# Patient Record
Sex: Female | Born: 1950 | Race: Black or African American | Hispanic: No | Marital: Single | Smoking: Former smoker
Health system: Southern US, Community
[De-identification: ages and names within clinical notes are randomized; demographics above are authoritative.]

## PROBLEM LIST (undated history)

## (undated) ENCOUNTER — Emergency Department (HOSPITAL_COMMUNITY): Admission: EM | Payer: Medicare Other | Source: Home / Self Care

## (undated) DIAGNOSIS — R519 Headache, unspecified: Secondary | ICD-10-CM

## (undated) DIAGNOSIS — K219 Gastro-esophageal reflux disease without esophagitis: Secondary | ICD-10-CM

## (undated) DIAGNOSIS — E119 Type 2 diabetes mellitus without complications: Secondary | ICD-10-CM

## (undated) DIAGNOSIS — F32A Depression, unspecified: Secondary | ICD-10-CM

## (undated) DIAGNOSIS — E785 Hyperlipidemia, unspecified: Secondary | ICD-10-CM

## (undated) DIAGNOSIS — Z973 Presence of spectacles and contact lenses: Secondary | ICD-10-CM

## (undated) DIAGNOSIS — K5792 Diverticulitis of intestine, part unspecified, without perforation or abscess without bleeding: Secondary | ICD-10-CM

## (undated) DIAGNOSIS — M1990 Unspecified osteoarthritis, unspecified site: Secondary | ICD-10-CM

## (undated) DIAGNOSIS — R479 Unspecified speech disturbances: Secondary | ICD-10-CM

## (undated) DIAGNOSIS — R011 Cardiac murmur, unspecified: Secondary | ICD-10-CM

## (undated) DIAGNOSIS — R51 Headache: Secondary | ICD-10-CM

## (undated) DIAGNOSIS — I10 Essential (primary) hypertension: Secondary | ICD-10-CM

## (undated) DIAGNOSIS — F329 Major depressive disorder, single episode, unspecified: Secondary | ICD-10-CM

## (undated) HISTORY — DX: Type 2 diabetes mellitus without complications: E11.9

## (undated) HISTORY — DX: Hyperlipidemia, unspecified: E78.5

## (undated) HISTORY — DX: Unspecified osteoarthritis, unspecified site: M19.90

## (undated) HISTORY — DX: Depression, unspecified: F32.A

## (undated) HISTORY — DX: Unspecified speech disturbances: R47.9

## (undated) HISTORY — DX: Major depressive disorder, single episode, unspecified: F32.9

## (undated) MED FILL — Trazodone HCl Tab 50 MG: ORAL | Fill #5 | Status: AC

---

## 2008-04-12 ENCOUNTER — Ambulatory Visit (HOSPITAL_COMMUNITY): Admission: RE | Admit: 2008-04-12 | Discharge: 2008-04-12 | Payer: Self-pay | Admitting: Gastroenterology

## 2008-04-24 ENCOUNTER — Ambulatory Visit (HOSPITAL_COMMUNITY): Admission: RE | Admit: 2008-04-24 | Discharge: 2008-04-24 | Payer: Self-pay | Admitting: Gastroenterology

## 2009-08-27 ENCOUNTER — Emergency Department (HOSPITAL_COMMUNITY): Admission: EM | Admit: 2009-08-27 | Discharge: 2009-08-28 | Payer: Self-pay | Admitting: Emergency Medicine

## 2010-01-26 ENCOUNTER — Encounter: Admission: RE | Admit: 2010-01-26 | Discharge: 2010-02-17 | Payer: Self-pay | Admitting: Family Medicine

## 2010-04-28 ENCOUNTER — Encounter: Admission: RE | Admit: 2010-04-28 | Discharge: 2010-05-08 | Payer: Self-pay | Admitting: Family Medicine

## 2011-05-12 LAB — CREATININE, SERUM: Creatinine, Ser: 0.94

## 2011-07-29 ENCOUNTER — Ambulatory Visit (INDEPENDENT_AMBULATORY_CARE_PROVIDER_SITE_OTHER): Payer: Managed Care, Other (non HMO)

## 2011-07-29 DIAGNOSIS — E756 Lipid storage disorder, unspecified: Secondary | ICD-10-CM

## 2011-07-29 DIAGNOSIS — Z23 Encounter for immunization: Secondary | ICD-10-CM

## 2011-07-29 DIAGNOSIS — I10 Essential (primary) hypertension: Secondary | ICD-10-CM

## 2011-09-20 ENCOUNTER — Telehealth: Payer: Self-pay | Admitting: Physician Assistant

## 2011-09-20 MED ORDER — ZOLPIDEM TARTRATE 10 MG PO TABS: 10.0000 mg | ORAL_TABLET | Freq: Every evening | ORAL | Status: DC | PRN

## 2011-09-20 NOTE — Telephone Encounter (Signed)
Please phone in

## 2011-09-20 NOTE — Telephone Encounter (Signed)
Rx called in 

## 2011-12-06 ENCOUNTER — Telehealth: Payer: Self-pay

## 2011-12-06 NOTE — Telephone Encounter (Signed)
[facility]ien 10 mg #15 for patient    Best phone for robin at [place] [phone]

## 2011-12-06 NOTE — Telephone Encounter (Signed)
Please pull paper chart.  

## 2011-12-06 NOTE — Telephone Encounter (Signed)
Can we fill this?

## 2011-12-07 MED ORDER — ZOLPIDEM TARTRATE 10 MG PO TABS: ORAL_TABLET | ORAL | Status: DC

## 2011-12-07 NOTE — Telephone Encounter (Signed)
Will refill her Remus Loffler this time, please advise her to use 1/2 at night as needed to avoid oversedation, please schedule a return OV before more refills are needed, [facility]

## 2012-01-13 ENCOUNTER — Telehealth: Payer: Self-pay

## 2012-01-13 NOTE — Telephone Encounter (Signed)
[facility]IN FROM [facility] HAVE A QUESTION REGARDING SOME MEDICINE THAT PT HAVE REQUESTED PLEASE CALL [phone]

## 2012-01-13 NOTE — Telephone Encounter (Signed)
Pt was written a Rx and [facility] the Rx was written for Hyzaar 50mg / Cozaar 50 mg was dispensed in error. This was in December. Pt coming in today to renew RX and pharmacist wants to know if okay to dispense, patient obviously not compliant with meds, also advise if Hyzaar okay or Cozaar. [facility] phone # [facility] [phone]

## 2012-01-14 NOTE — Telephone Encounter (Signed)
Need paper chart  

## 2012-01-15 MED ORDER — LOSARTAN POTASSIUM-HCTZ 50-12.5 MG PO TABS: 1.0000 | ORAL_TABLET | Freq: Every day | ORAL | Status: DC

## 2012-01-15 NOTE — Telephone Encounter (Signed)
Chart pulled to PA 

## 2012-01-15 NOTE — Telephone Encounter (Signed)
Pharmacy notified.

## 2012-01-15 NOTE — Telephone Encounter (Signed)
Patient should be on Hyzaar 50 mg. Ok to fill one time. Needs ov.

## 2012-02-22 ENCOUNTER — Other Ambulatory Visit: Payer: Self-pay | Admitting: Family Medicine

## 2012-02-22 MED ORDER — VENLAFAXINE HCL 50 MG PO TABS: 50.0000 mg | ORAL_TABLET | Freq: Every day | ORAL | Status: DC

## 2012-04-04 ENCOUNTER — Ambulatory Visit (INDEPENDENT_AMBULATORY_CARE_PROVIDER_SITE_OTHER): Payer: Managed Care, Other (non HMO) | Admitting: Internal Medicine

## 2012-04-04 VITALS — BP 118/75 | HR 74 | Temp 98.1°F | Resp 16 | Ht 60.25 in | Wt 149.0 lb

## 2012-04-04 DIAGNOSIS — F32A Depression, unspecified: Secondary | ICD-10-CM

## 2012-04-04 DIAGNOSIS — F329 Major depressive disorder, single episode, unspecified: Secondary | ICD-10-CM

## 2012-04-04 DIAGNOSIS — F3289 Other specified depressive episodes: Secondary | ICD-10-CM

## 2012-04-04 DIAGNOSIS — G4700 Insomnia, unspecified: Secondary | ICD-10-CM

## 2012-04-04 DIAGNOSIS — E119 Type 2 diabetes mellitus without complications: Secondary | ICD-10-CM

## 2012-04-04 DIAGNOSIS — E785 Hyperlipidemia, unspecified: Secondary | ICD-10-CM

## 2012-04-04 LAB — GLUCOSE, POCT (MANUAL RESULT ENTRY): POC Glucose: 100 mg/dl — AB (ref 70–99)

## 2012-04-04 LAB — POCT GLYCOSYLATED HEMOGLOBIN (HGB A1C): Hemoglobin A1C: 6

## 2012-04-04 NOTE — Progress Notes (Signed)
  Subjective:    Patient ID: Meghan Hickman, female    DOB: [birth year], 61 y.o.   MRN: [id number]  HPI Ran out of diabetes medication yesterday. Urinating a lot at night 3  To 4 times a night. Weight gain  Compliant with all meds No chest pain no shortness of breath.  Review of Systems  Constitutional: Negative.   HENT: Negative.   Eyes: Negative.   Respiratory: Negative.   Cardiovascular: Negative.   Gastrointestinal: Negative.   Genitourinary: Positive for frequency.  Musculoskeletal: Negative.   Skin: Negative.   Neurological: Negative.   Hematological: Negative.   Psychiatric/Behavioral: Negative.   All other systems reviewed and are negative.       Objective:   Physical Exam  Nursing note and vitals reviewed. Constitutional: She is oriented to person, place, and time. She appears well-developed and well-nourished.  HENT:  Head: Normocephalic and atraumatic.  Right Ear: External ear normal.  Left Ear: External ear normal.  Mouth/Throat: Oropharynx is clear and moist.  Eyes: Conjunctivae and EOM are normal. Pupils are equal, round, and reactive to light.  Neck: Normal range of motion. Neck supple.  Cardiovascular: Normal rate, regular rhythm, normal heart sounds and intact distal pulses.   Pulmonary/Chest: Effort normal and breath sounds normal.  Abdominal: Soft. Bowel sounds are normal.  Musculoskeletal: Normal range of motion.  Neurological: She is alert and oriented to person, place, and time.  Skin: Skin is warm and dry.  Psychiatric: She has a normal mood and affect. Her behavior is normal. Judgment and thought content normal.   Results for orders placed in visit on 04/04/12  GLUCOSE, POCT (MANUAL RESULT ENTRY)      Component Value Range   POC Glucose 100 (*) 70 - 99 mg/dl  POCT GLYCOSYLATED HEMOGLOBIN (HGB A1C)      Component Value Range   Hemoglobin A1C 6.0           Assessment & Plan:  Diabetes will check her hgb a1c and glusose, will restart  her meds. Great glucose hgb a1c is good. Will continue meds.   Depression well controlled/ continue meds   Insomnia well controlled/ continue meds  hyyperlipidemia will continue meds and repeat blood tests  Pt needs new primary care provider since Dr Hal Hope has left.

## 2012-04-04 NOTE — Patient Instructions (Signed)
Take all meds as directed. Obtain new primary care physician. The rest of you labb eval will be back in one week.

## 2012-04-05 ENCOUNTER — Encounter: Payer: Self-pay | Admitting: Physician Assistant

## 2012-04-05 LAB — LIPID PANEL
Cholesterol: 228 mg/dL — ABNORMAL HIGH (ref 0–200)
HDL: 42 mg/dL (ref >39)
LDL Cholesterol: 156 mg/dL — ABNORMAL HIGH (ref 0–99)
Total CHOL/HDL Ratio: 5.4 ratio
Triglycerides: 148 mg/dL (ref <150)
VLDL: 30 mg/dL (ref 0–40)

## 2012-04-05 LAB — COMPREHENSIVE METABOLIC PANEL WITH GFR
ALT: 14 U/L (ref 0–35)
AST: 14 U/L (ref 0–37)
Albumin: 4.2 g/dL (ref 3.5–5.2)
Alkaline Phosphatase: 75 U/L (ref 39–117)
BUN: 15 mg/dL (ref 6–23)
CO2: 30 meq/L (ref 19–32)
Calcium: 10 mg/dL (ref 8.4–10.5)
Chloride: 104 meq/L (ref 96–112)
Creat: 0.8 mg/dL (ref 0.50–1.10)
Glucose, Bld: 95 mg/dL (ref 70–99)
Potassium: 4.1 meq/L (ref 3.5–5.3)
Sodium: 142 meq/L (ref 135–145)
Total Bilirubin: 0.8 mg/dL (ref 0.3–1.2)
Total Protein: 7.1 g/dL (ref 6.0–8.3)

## 2012-04-07 ENCOUNTER — Telehealth: Payer: Self-pay

## 2012-04-07 NOTE — Telephone Encounter (Signed)
The patient called and stated that her medications were not sent to the pharmacy as they should have been on Tuesday 04/04/12.  The patient stated that her Metformin,Pravachol,Effexor and Ambien rxs were not called in to pharmacy as told.  Please call the patient at [phone].

## 2012-04-08 MED ORDER — VENLAFAXINE HCL 50 MG PO TABS: 50.0000 mg | ORAL_TABLET | Freq: Every day | ORAL | Status: DC

## 2012-04-08 MED ORDER — METFORMIN HCL 500 MG PO TABS: 500.0000 mg | ORAL_TABLET | Freq: Every day | ORAL | Status: DC

## 2012-04-08 MED ORDER — ZOLPIDEM TARTRATE 10 MG PO TABS: ORAL_TABLET | ORAL | Status: DC

## 2012-04-08 MED ORDER — PRAVASTATIN SODIUM 40 MG PO TABS: 40.0000 mg | ORAL_TABLET | Freq: Every day | ORAL | Status: DC

## 2012-04-08 NOTE — Telephone Encounter (Signed)
Rx's done and sent to pharmacy. Ambien to be faxed.

## 2012-04-08 NOTE — Telephone Encounter (Signed)
Looks like we were supposed to refill these meds to pharmacy and have not--can we?

## 2012-04-08 NOTE — Telephone Encounter (Signed)
Rx faxed, patient notified.

## 2012-05-20 ENCOUNTER — Other Ambulatory Visit: Payer: Self-pay | Admitting: Radiology

## 2012-05-20 MED ORDER — LOSARTAN POTASSIUM-HCTZ 50-12.5 MG PO TABS: 1.0000 | ORAL_TABLET | Freq: Every day | ORAL | Status: DC

## 2012-05-23 ENCOUNTER — Other Ambulatory Visit: Payer: Self-pay | Admitting: Radiology

## 2012-05-30 ENCOUNTER — Ambulatory Visit (INDEPENDENT_AMBULATORY_CARE_PROVIDER_SITE_OTHER): Payer: Managed Care, Other (non HMO) | Admitting: Internal Medicine

## 2012-05-30 ENCOUNTER — Ambulatory Visit: Payer: Managed Care, Other (non HMO)

## 2012-05-30 VITALS — BP 121/79 | HR 101 | Temp 98.2°F | Resp 16 | Ht 59.25 in | Wt 150.0 lb

## 2012-05-30 DIAGNOSIS — M79609 Pain in unspecified limb: Secondary | ICD-10-CM

## 2012-05-30 DIAGNOSIS — S99929A Unspecified injury of unspecified foot, initial encounter: Secondary | ICD-10-CM

## 2012-05-30 DIAGNOSIS — S99919A Unspecified injury of unspecified ankle, initial encounter: Secondary | ICD-10-CM

## 2012-05-30 DIAGNOSIS — S92912A Unspecified fracture of left toe(s), initial encounter for closed fracture: Secondary | ICD-10-CM

## 2012-05-30 DIAGNOSIS — S8990XA Unspecified injury of unspecified lower leg, initial encounter: Secondary | ICD-10-CM

## 2012-05-30 DIAGNOSIS — M79675 Pain in left toe(s): Secondary | ICD-10-CM

## 2012-05-30 DIAGNOSIS — S92919A Unspecified fracture of unspecified toe(s), initial encounter for closed fracture: Secondary | ICD-10-CM

## 2012-05-30 NOTE — Progress Notes (Signed)
  Subjective:    Patient ID: Meghan Hickman, female    DOB: [birth year], 61 y.o.   MRN: [id number]  HPI  Meghan Hickman is a 61 yr old female complaining of left 5th toe pain after stubbing the toe about 2 weeks ago.  She notes pain in the little toe as well as some pain proximally into the foot.  She is able to bear weight but finds herself limping often.  She is only able to wear sandals due to pain when wearing a full shoe.  She notes that the left 5th toe is more swollen than the right.  No other toes are affected.  No ankle pain.  No numbness or tingling.    Review of Systems  All other systems reviewed and are negative.       Objective:   Physical Exam  Vitals reviewed. Constitutional: She is oriented to person, place, and time. She appears well-developed and well-nourished. No distress.  Cardiovascular: Intact distal pulses.   Musculoskeletal:       Left ankle: Normal.       Right foot: Normal.       Left foot: She exhibits tenderness and swelling. She exhibits no bony tenderness, normal capillary refill, no deformity and no laceration.       5th toe with mild swelling as compared to the right; full ROM; somewhat TTP over the toe and distal 5th metatarsal; no bony tenderness at the base of the 5th metatarsal  Neurological: She is alert and oriented to person, place, and time.  Skin: Skin is warm and dry.  Psychiatric: Her speech is normal and behavior is normal.       Affect somewhat flat    Filed Vitals:   05/30/12 1625  BP: 121/79  Pulse: 101  Temp: 98.2 F (36.8 C)  Resp: 16    [facility] reading (PRIMARY) by  Dr. Merla Riches, non-displaced fracture of the 5th proximal phalanx.        Assessment & Plan:   1. Toe fracture, left    2. Toe injury  DG Foot Complete Left  3. Toe pain, left  DG Foot Complete Left   Meghan Hickman is a 61 yr old female with a fractured left 5th toe.  The fracture is non-displaced, and there is no deformity of the toe.  The toe was buddy  taped, and the patient was instructed how to tape the toes at home.  She will keep the toe taped for 4-6 weeks.  Encouraged her to use tylenol/advil for pain if needed.   Discussed with the patient that her recovery may take several weeks.  She will RTC if worsening or not improving.    I have reviewed and agree with documentation. Robert P. Merla Riches, M.D.

## 2012-10-25 ENCOUNTER — Other Ambulatory Visit: Payer: Self-pay | Admitting: Physician Assistant

## 2012-10-25 NOTE — Telephone Encounter (Signed)
Please pull paper chart.  

## 2012-10-25 NOTE — Telephone Encounter (Signed)
Chart pulled to PA pool at nurses station [phone]

## 2012-10-27 ENCOUNTER — Other Ambulatory Visit: Payer: Self-pay | Admitting: Radiology

## 2012-10-27 NOTE — Telephone Encounter (Signed)
Patient needs office visit prior to approval of her Ambien. Denial faxed.

## 2013-01-08 ENCOUNTER — Other Ambulatory Visit: Payer: Self-pay | Admitting: Physician Assistant

## 2013-02-07 ENCOUNTER — Other Ambulatory Visit: Payer: Self-pay | Admitting: Orthopedic Surgery

## 2013-02-09 ENCOUNTER — Ambulatory Visit (INDEPENDENT_AMBULATORY_CARE_PROVIDER_SITE_OTHER): Payer: Managed Care, Other (non HMO) | Admitting: Emergency Medicine

## 2013-02-09 VITALS — BP 134/80 | HR 89 | Temp 98.0°F | Resp 16 | Ht 60.0 in | Wt 144.0 lb

## 2013-02-09 DIAGNOSIS — G4700 Insomnia, unspecified: Secondary | ICD-10-CM

## 2013-02-09 DIAGNOSIS — E119 Type 2 diabetes mellitus without complications: Secondary | ICD-10-CM | POA: Insufficient documentation

## 2013-02-09 DIAGNOSIS — I10 Essential (primary) hypertension: Secondary | ICD-10-CM

## 2013-02-09 DIAGNOSIS — R011 Cardiac murmur, unspecified: Secondary | ICD-10-CM

## 2013-02-09 DIAGNOSIS — F32A Depression, unspecified: Secondary | ICD-10-CM

## 2013-02-09 DIAGNOSIS — E785 Hyperlipidemia, unspecified: Secondary | ICD-10-CM | POA: Insufficient documentation

## 2013-02-09 DIAGNOSIS — F329 Major depressive disorder, single episode, unspecified: Secondary | ICD-10-CM

## 2013-02-09 LAB — COMPREHENSIVE METABOLIC PANEL
ALT: 14 U/L (ref 0–35)
Albumin: 4.5 g/dL (ref 3.5–5.2)
Calcium: 9.8 mg/dL (ref 8.4–10.5)
Glucose, Bld: 113 mg/dL — ABNORMAL HIGH (ref 70–99)
Sodium: 140 mEq/L (ref 135–145)
Total Bilirubin: 0.6 mg/dL (ref 0.3–1.2)
Total Protein: 7 g/dL (ref 6.0–8.3)

## 2013-02-09 LAB — LIPID PANEL
Cholesterol: 209 mg/dL — ABNORMAL HIGH (ref 0–200)
LDL Cholesterol: 148 mg/dL — ABNORMAL HIGH (ref 0–99)
Total CHOL/HDL Ratio: 4.4 Ratio
Triglycerides: 67 mg/dL (ref <150)
VLDL: 13 mg/dL (ref 0–40)

## 2013-02-09 LAB — POCT CBC
Granulocyte percent: 58.5 %G (ref 37–80)
Hemoglobin: 12.6 g/dL (ref 12.2–16.2)
MCH, POC: 27 pg (ref 27–31.2)
MCV: 88.1 fL (ref 80–97)
MPV: 8.8 fL (ref 0–99.8)
Platelet Count, POC: 379 10*3/uL (ref 142–424)
RBC: 4.66 M/uL (ref 4.04–5.48)
WBC: 7.3 10*3/uL (ref 4.6–10.2)

## 2013-02-09 LAB — GLUCOSE, POCT (MANUAL RESULT ENTRY): POC Glucose: 109 mg/dl — AB (ref 70–99)

## 2013-02-09 LAB — MICROALBUMIN, URINE: Microalb, Ur: 0.59 mg/dL (ref 0.00–1.89)

## 2013-02-09 MED ORDER — LOSARTAN POTASSIUM-HCTZ 50-12.5 MG PO TABS: 1.0000 | ORAL_TABLET | Freq: Every day | ORAL | Status: DC

## 2013-02-09 MED ORDER — ZOLPIDEM TARTRATE 5 MG PO TABS: 5.0000 mg | ORAL_TABLET | Freq: Every evening | ORAL | Status: DC | PRN

## 2013-02-09 MED ORDER — METFORMIN HCL 500 MG PO TABS: 500.0000 mg | ORAL_TABLET | Freq: Every day | ORAL | Status: DC

## 2013-02-09 MED ORDER — PRAVASTATIN SODIUM 40 MG PO TABS: 40.0000 mg | ORAL_TABLET | Freq: Every day | ORAL | Status: DC

## 2013-02-09 MED ORDER — VENLAFAXINE HCL 50 MG PO TABS: 50.0000 mg | ORAL_TABLET | Freq: Every day | ORAL | Status: DC

## 2013-02-09 NOTE — Progress Notes (Signed)
  Subjective:    Patient ID: Meghan Hickman, female    DOB: 15-Feb-1951, 62 y.o.   MRN: [id number]  HPI  62 YO patient here today to have her blood work done. She has been feeling tired. Her sugar was low a few weeks ago. She has felt tired ever since. She passed out one day. She forgot to eat. She was getting her car serviced then go eat. She got a cracker and was feeling fine after.   She is scheduled to have bone spurs removed from her shoulder July 14th. She wants a little check up to make sure she will be ok under anesthesia.   She has diabetes and high cholesterol and takes medications. She does not check her sugar right now she is out of her CBG strips. She was monitored by Dr. Hal Hope.     Review of Systems     Objective:   Physical Exam HEENT exam is unremarkable. Neck supple. Chest clear. Cardiac exam reveals a 2/6 systolic murmur at the left sternal border. Abdomen is tender midepigastrium and right upper abdomen  Results for orders placed in visit on 02/09/13  POCT CBC      Result Value Range   WBC 7.3  4.6 - 10.2 K/uL   Lymph, poc 2.5  0.6 - 3.4   POC LYMPH PERCENT 34.7  10 - 50 %L   MID (cbc) 0.5  0 - 0.9   POC MID % 6.8  0 - 12 %M   POC Granulocyte 4.3  2 - 6.9   Granulocyte percent 58.5  37 - 80 %G   RBC 4.66  4.04 - 5.48 M/uL   Hemoglobin 12.6  12.2 - 16.2 g/dL   HCT, POC 40.9  81.1 - 47.9 %   MCV 88.1  80 - 97 fL   MCH, POC 27.0  27 - 31.2 pg   MCHC 30.7 (*) 31.8 - 35.4 g/dL   RDW, POC 91.4     Platelet Count, POC 379  142 - 424 K/uL   MPV 8.8  0 - 99.8 fL  POCT GLYCOSYLATED HEMOGLOBIN (HGB A1C)      Result Value Range   Hemoglobin A1C 5.7    GLUCOSE, POCT (MANUAL RESULT ENTRY)      Result Value Range   POC Glucose 109 (*) 70 - 99 mg/dl       EKG THERE are tiny Q waves in 3 and aVF no acute changes Assessment & Plan:  Patient will need cardiac clearance prior to surgery because of her history and the presence of a heart murmur.

## 2013-02-11 DIAGNOSIS — R011 Cardiac murmur, unspecified: Secondary | ICD-10-CM | POA: Insufficient documentation

## 2013-02-11 MED ORDER — ATORVASTATIN CALCIUM 40 MG PO TABS: 40.0000 mg | ORAL_TABLET | Freq: Every day | ORAL | Status: DC

## 2013-02-11 NOTE — Addendum Note (Signed)
Addended by: Johnnette Litter on: 02/11/2013 10:03 AM   Modules accepted: Orders, Medications

## 2013-02-13 ENCOUNTER — Encounter (HOSPITAL_BASED_OUTPATIENT_CLINIC_OR_DEPARTMENT_OTHER): Payer: Self-pay | Admitting: *Deleted

## 2013-02-13 NOTE — Progress Notes (Signed)
Dr Cleta Alberts office has pt ov with dr Nadara Eaton 02/14/13-will get recorded after visit

## 2013-02-13 NOTE — Progress Notes (Signed)
Pt saw dr Cleta Alberts 02/09/13-had ekg and cbc cmet-he is referring her to cardiology prior to surgery-02/19/13-called their office-they are working on an ov -will let me know

## 2013-02-19 ENCOUNTER — Ambulatory Visit (HOSPITAL_BASED_OUTPATIENT_CLINIC_OR_DEPARTMENT_OTHER): Admission: RE | Admit: 2013-02-19 | Payer: Self-pay | Source: Ambulatory Visit | Admitting: Orthopedic Surgery

## 2013-02-19 HISTORY — DX: Essential (primary) hypertension: I10

## 2013-02-19 HISTORY — DX: Cardiac murmur, unspecified: R01.1

## 2013-02-19 HISTORY — DX: Presence of spectacles and contact lenses: Z97.3

## 2013-02-19 SURGERY — SHOULDER ARTHROSCOPY WITH SUBACROMIAL DECOMPRESSION AND DISTAL CLAVICLE EXCISION
Anesthesia: Choice | Site: Shoulder | Laterality: Right

## 2013-02-19 SURGERY — Surgical Case
Anesthesia: *Unknown

## 2013-03-20 ENCOUNTER — Encounter: Payer: Self-pay | Admitting: Emergency Medicine

## 2013-03-28 ENCOUNTER — Other Ambulatory Visit: Payer: Self-pay | Admitting: Orthopedic Surgery

## 2013-04-03 ENCOUNTER — Other Ambulatory Visit: Payer: Self-pay | Admitting: Emergency Medicine

## 2013-04-16 ENCOUNTER — Encounter (HOSPITAL_BASED_OUTPATIENT_CLINIC_OR_DEPARTMENT_OTHER): Payer: Self-pay | Admitting: *Deleted

## 2013-04-16 NOTE — Progress Notes (Signed)
Pt was to have surgery 7/14-had ro see dr Nadara Eaton prior-called for notes since clearance Needs to come in for bmet

## 2013-04-23 ENCOUNTER — Encounter (HOSPITAL_BASED_OUTPATIENT_CLINIC_OR_DEPARTMENT_OTHER)
Admission: RE | Disposition: A | Discharge status: Home / Self Care | Payer: Self-pay | Source: Ambulatory Visit | Attending: Orthopedic Surgery

## 2013-04-23 ENCOUNTER — Encounter (HOSPITAL_BASED_OUTPATIENT_CLINIC_OR_DEPARTMENT_OTHER): Payer: Self-pay | Admitting: *Deleted

## 2013-04-23 ENCOUNTER — Ambulatory Visit (HOSPITAL_BASED_OUTPATIENT_CLINIC_OR_DEPARTMENT_OTHER)
Admission: RE | Admit: 2013-04-23 | Discharge: 2013-04-24 | Disposition: A | Discharge status: Home / Self Care | Source: Ambulatory Visit | Attending: Orthopedic Surgery | Admitting: Orthopedic Surgery

## 2013-04-23 ENCOUNTER — Encounter (HOSPITAL_BASED_OUTPATIENT_CLINIC_OR_DEPARTMENT_OTHER): Payer: Self-pay | Admitting: Anesthesiology

## 2013-04-23 ENCOUNTER — Ambulatory Visit (HOSPITAL_BASED_OUTPATIENT_CLINIC_OR_DEPARTMENT_OTHER): Admitting: Anesthesiology

## 2013-04-23 DIAGNOSIS — M75120 Complete rotator cuff tear or rupture of unspecified shoulder, not specified as traumatic: Secondary | ICD-10-CM | POA: Insufficient documentation

## 2013-04-23 DIAGNOSIS — M19019 Primary osteoarthritis, unspecified shoulder: Secondary | ICD-10-CM | POA: Insufficient documentation

## 2013-04-23 DIAGNOSIS — M25819 Other specified joint disorders, unspecified shoulder: Secondary | ICD-10-CM | POA: Insufficient documentation

## 2013-04-23 DIAGNOSIS — Z9889 Other specified postprocedural states: Secondary | ICD-10-CM

## 2013-04-23 DIAGNOSIS — I10 Essential (primary) hypertension: Secondary | ICD-10-CM | POA: Insufficient documentation

## 2013-04-23 DIAGNOSIS — E119 Type 2 diabetes mellitus without complications: Secondary | ICD-10-CM | POA: Insufficient documentation

## 2013-04-23 DIAGNOSIS — M66329 Spontaneous rupture of flexor tendons, unspecified upper arm: Secondary | ICD-10-CM | POA: Insufficient documentation

## 2013-04-23 DIAGNOSIS — E785 Hyperlipidemia, unspecified: Secondary | ICD-10-CM | POA: Insufficient documentation

## 2013-04-23 HISTORY — PX: SHOULDER ARTHROSCOPY WITH SUBACROMIAL DECOMPRESSION AND BICEP TENDON REPAIR: SHX5689

## 2013-04-23 LAB — GLUCOSE, CAPILLARY: Glucose-Capillary: 175 mg/dL — ABNORMAL HIGH (ref 70–99)

## 2013-04-23 SURGERY — SHOULDER ARTHROSCOPY WITH SUBACROMIAL DECOMPRESSION AND BICEP TENDON REPAIR
Anesthesia: Regional | Laterality: Right | Wound class: Clean

## 2013-04-23 MED ORDER — PROPOFOL 10 MG/ML IV BOLUS
INTRAVENOUS | Status: DC | PRN
  Administered 2013-04-23: 150 mg via INTRAVENOUS

## 2013-04-23 MED ORDER — SUCCINYLCHOLINE CHLORIDE 20 MG/ML IJ SOLN
INTRAMUSCULAR | Status: DC | PRN
  Administered 2013-04-23: 100 mg via INTRAVENOUS

## 2013-04-23 MED ORDER — METFORMIN HCL 500 MG PO TABS
500.0000 mg | ORAL_TABLET | Freq: Every day | ORAL | Status: DC
  Administered 2013-04-23 (×2): 500 mg via ORAL

## 2013-04-23 MED ORDER — OXYCODONE HCL 5 MG/5ML PO SOLN: 5.0000 mg | Freq: Once | ORAL | Status: DC | PRN

## 2013-04-23 MED ORDER — PHENOL 1.4 % MT LIQD: 1.0000 | OROMUCOSAL | Status: DC | PRN

## 2013-04-23 MED ORDER — BISACODYL 10 MG RE SUPP: 10.0000 mg | Freq: Every day | RECTAL | Status: DC | PRN

## 2013-04-23 MED ORDER — POVIDONE-IODINE 7.5 % EX SOLN: Freq: Once | CUTANEOUS | Status: DC

## 2013-04-23 MED ORDER — LIDOCAINE HCL (CARDIAC) 20 MG/ML IV SOLN
INTRAVENOUS | Status: DC | PRN
  Administered 2013-04-23: 50 mg via INTRAVENOUS

## 2013-04-23 MED ORDER — ZOLPIDEM TARTRATE 5 MG PO TABS: 5.0000 mg | ORAL_TABLET | Freq: Every evening | ORAL | Status: DC | PRN

## 2013-04-23 MED ORDER — ONDANSETRON HCL 4 MG/2ML IJ SOLN
INTRAMUSCULAR | Status: DC | PRN
  Administered 2013-04-23: 4 mg via INTRAVENOUS

## 2013-04-23 MED ORDER — ACETAMINOPHEN 650 MG RE SUPP: 650.0000 mg | Freq: Four times a day (QID) | RECTAL | Status: DC | PRN

## 2013-04-23 MED ORDER — FENTANYL CITRATE 0.05 MG/ML IJ SOLN
50.0000 ug | INTRAMUSCULAR | Status: DC | PRN
  Administered 2013-04-23: 100 ug via INTRAVENOUS

## 2013-04-23 MED ORDER — DOCUSATE SODIUM 100 MG PO CAPS
100.0000 mg | ORAL_CAPSULE | Freq: Two times a day (BID) | ORAL | Status: DC
  Administered 2013-04-23: 100 mg via ORAL

## 2013-04-23 MED ORDER — SODIUM CHLORIDE 0.9 % IR SOLN
Status: DC | PRN
  Administered 2013-04-23: 6000 mL

## 2013-04-23 MED ORDER — OXYCODONE HCL 5 MG PO TABS: 5.0000 mg | ORAL_TABLET | ORAL | Status: DC | PRN

## 2013-04-23 MED ORDER — POLYETHYLENE GLYCOL 3350 17 G PO PACK: 17.0000 g | PACK | Freq: Every day | ORAL | Status: DC | PRN

## 2013-04-23 MED ORDER — OXYCODONE HCL 5 MG PO TABS: 5.0000 mg | ORAL_TABLET | Freq: Once | ORAL | Status: DC | PRN

## 2013-04-23 MED ORDER — CEFAZOLIN SODIUM-DEXTROSE 2-3 GM-% IV SOLR
2.0000 g | INTRAVENOUS | Status: AC
  Administered 2013-04-23: 2 g via INTRAVENOUS

## 2013-04-23 MED ORDER — ONDANSETRON HCL 4 MG/2ML IJ SOLN: 4.0000 mg | Freq: Four times a day (QID) | INTRAMUSCULAR | Status: DC | PRN

## 2013-04-23 MED ORDER — VENLAFAXINE HCL 50 MG PO TABS
50.0000 mg | ORAL_TABLET | Freq: Every day | ORAL | Status: DC
  Administered 2013-04-23: 50 mg via ORAL

## 2013-04-23 MED ORDER — OXYCODONE-ACETAMINOPHEN 5-325 MG PO TABS: 1.0000 | ORAL_TABLET | ORAL | Status: DC | PRN

## 2013-04-23 MED ORDER — METOCLOPRAMIDE HCL 5 MG PO TABS: 5.0000 mg | ORAL_TABLET | Freq: Three times a day (TID) | ORAL | Status: DC | PRN

## 2013-04-23 MED ORDER — ONDANSETRON HCL 4 MG PO TABS: 4.0000 mg | ORAL_TABLET | Freq: Four times a day (QID) | ORAL | Status: DC | PRN

## 2013-04-23 MED ORDER — HYDROMORPHONE HCL PF 1 MG/ML IJ SOLN: 0.2500 mg | INTRAMUSCULAR | Status: DC | PRN

## 2013-04-23 MED ORDER — BUPIVACAINE-EPINEPHRINE PF 0.5-1:200000 % IJ SOLN
INTRAMUSCULAR | Status: DC | PRN
  Administered 2013-04-23: 30 mL

## 2013-04-23 MED ORDER — SODIUM CHLORIDE 0.9 % IV SOLN
INTRAVENOUS | Status: DC
  Administered 2013-04-23: 100 mL/h via INTRAVENOUS

## 2013-04-23 MED ORDER — INSULIN ASPART 100 UNIT/ML ~~LOC~~ SOLN
0.0000 [IU] | Freq: Three times a day (TID) | SUBCUTANEOUS | Status: DC
  Administered 2013-04-23: 3 [IU] via SUBCUTANEOUS

## 2013-04-23 MED ORDER — PHENYLEPHRINE HCL 10 MG/ML IJ SOLN
10.0000 mg | INTRAVENOUS | Status: DC | PRN
  Administered 2013-04-23: 40 ug/min via INTRAVENOUS

## 2013-04-23 MED ORDER — ACETAMINOPHEN 325 MG PO TABS: 650.0000 mg | ORAL_TABLET | Freq: Four times a day (QID) | ORAL | Status: DC | PRN

## 2013-04-23 MED ORDER — MORPHINE SULFATE 2 MG/ML IJ SOLN: 1.0000 mg | INTRAMUSCULAR | Status: DC | PRN

## 2013-04-23 MED ORDER — TRAMADOL HCL 50 MG PO TABS: 50.0000 mg | ORAL_TABLET | Freq: Four times a day (QID) | ORAL | Status: DC | PRN

## 2013-04-23 MED ORDER — HYDROCODONE-ACETAMINOPHEN 5-325 MG PO TABS: 1.0000 | ORAL_TABLET | ORAL | Status: DC | PRN

## 2013-04-23 MED ORDER — DIPHENHYDRAMINE HCL 12.5 MG/5ML PO ELIX: 12.5000 mg | ORAL_SOLUTION | ORAL | Status: DC | PRN

## 2013-04-23 MED ORDER — DEXAMETHASONE SODIUM PHOSPHATE 4 MG/ML IJ SOLN
INTRAMUSCULAR | Status: DC | PRN
  Administered 2013-04-23: 8 mg via INTRAVENOUS

## 2013-04-23 MED ORDER — EPHEDRINE SULFATE 50 MG/ML IJ SOLN
INTRAMUSCULAR | Status: DC | PRN
  Administered 2013-04-23: 10 mg via INTRAVENOUS

## 2013-04-23 MED ORDER — FLEET ENEMA 7-19 GM/118ML RE ENEM: 1.0000 | ENEMA | Freq: Once | RECTAL | Status: AC | PRN

## 2013-04-23 MED ORDER — LACTATED RINGERS IV SOLN
INTRAVENOUS | Status: DC
  Administered 2013-04-23 (×2): via INTRAVENOUS

## 2013-04-23 MED ORDER — MENTHOL 3 MG MT LOZG: 1.0000 | LOZENGE | OROMUCOSAL | Status: DC | PRN

## 2013-04-23 MED ORDER — ATORVASTATIN CALCIUM 40 MG PO TABS
40.0000 mg | ORAL_TABLET | Freq: Every day | ORAL | Status: DC
  Administered 2013-04-24: 40 mg via ORAL

## 2013-04-23 MED ORDER — PHENYLEPHRINE HCL 10 MG/ML IJ SOLN
INTRAMUSCULAR | Status: DC | PRN
  Administered 2013-04-23 (×2): 40 ug via INTRAVENOUS

## 2013-04-23 MED ORDER — MIDAZOLAM HCL 2 MG/2ML IJ SOLN
1.0000 mg | INTRAMUSCULAR | Status: DC | PRN
  Administered 2013-04-23: 2 mg via INTRAVENOUS

## 2013-04-23 MED ORDER — METOCLOPRAMIDE HCL 5 MG/ML IJ SOLN: 5.0000 mg | Freq: Three times a day (TID) | INTRAMUSCULAR | Status: DC | PRN

## 2013-04-23 MED ORDER — ASPIRIN 81 MG PO TABS: 81.0000 mg | ORAL_TABLET | Freq: Every day | ORAL | Status: DC

## 2013-04-23 MED ORDER — LACTATED RINGERS IV SOLN: INTRAVENOUS | Status: DC

## 2013-04-23 MED ORDER — LOSARTAN POTASSIUM-HCTZ 50-12.5 MG PO TABS
1.0000 | ORAL_TABLET | Freq: Every day | ORAL | Status: DC
  Administered 2013-04-24: 1 via ORAL

## 2013-04-23 SURGICAL SUPPLY — 81 items
BENZOIN TINCTURE PRP APPL 2/3 (GAUZE/BANDAGES/DRESSINGS) IMPLANT
BLADE SURG 15 STRL LF DISP TIS (BLADE) IMPLANT
BLADE SURG 15 STRL SS (BLADE)
BLADE SURG ROTATE 9660 (MISCELLANEOUS) IMPLANT
BLADE VORTEX 6.0 (BLADE) IMPLANT
BUR OVAL 4.0 (BURR) ×2 IMPLANT
CANISTER SUCT LVC 12 LTR MEDI- (MISCELLANEOUS) ×2 IMPLANT
CANISTER SUCTION 2500CC (MISCELLANEOUS) IMPLANT
CANNULA 5.75X71 LONG (CANNULA) ×2 IMPLANT
CANNULA TWIST IN 8.25X7CM (CANNULA) IMPLANT
CHLORAPREP W/TINT 26ML (MISCELLANEOUS) ×2 IMPLANT
CLOTH BEACON ORANGE TIMEOUT ST (SAFETY) ×2 IMPLANT
DECANTER SPIKE VIAL GLASS SM (MISCELLANEOUS) IMPLANT
DERMABOND ADVANCED (GAUZE/BANDAGES/DRESSINGS)
DERMABOND ADVANCED .7 DNX12 (GAUZE/BANDAGES/DRESSINGS) IMPLANT
DRAPE INCISE IOBAN 66X45 STRL (DRAPES) ×2 IMPLANT
DRAPE STERI 35X30 U-POUCH (DRAPES) ×2 IMPLANT
DRAPE SURG 17X23 STRL (DRAPES) ×2 IMPLANT
DRAPE U 20/CS (DRAPES) ×2 IMPLANT
DRAPE U-SHAPE 47X51 STRL (DRAPES) ×2 IMPLANT
DRAPE U-SHAPE 76X120 STRL (DRAPES) ×4 IMPLANT
DRSG PAD ABDOMINAL 8X10 ST (GAUZE/BANDAGES/DRESSINGS) ×2 IMPLANT
ELECT BLADE 4.0 EZ CLEAN MEGAD (MISCELLANEOUS)
ELECT REM PT RETURN 9FT ADLT (ELECTROSURGICAL) ×2
ELECTRODE BLDE 4.0 EZ CLN MEGD (MISCELLANEOUS) IMPLANT
ELECTRODE REM PT RTRN 9FT ADLT (ELECTROSURGICAL) ×1 IMPLANT
GAUZE SPONGE 4X4 16PLY XRAY LF (GAUZE/BANDAGES/DRESSINGS) IMPLANT
GAUZE XEROFORM 1X8 LF (GAUZE/BANDAGES/DRESSINGS) ×2 IMPLANT
GLOVE BIO SURGEON STRL SZ7 (GLOVE) ×2 IMPLANT
GLOVE BIO SURGEON STRL SZ7.5 (GLOVE) ×2 IMPLANT
GLOVE BIOGEL PI IND STRL 7.0 (GLOVE) ×2 IMPLANT
GLOVE BIOGEL PI IND STRL 8 (GLOVE) ×1 IMPLANT
GLOVE BIOGEL PI INDICATOR 7.0 (GLOVE) ×2
GLOVE BIOGEL PI INDICATOR 8 (GLOVE) ×1
GLOVE ECLIPSE 6.5 STRL STRAW (GLOVE) ×2 IMPLANT
GOWN BRE IMP PREV XXLGXLNG (GOWN DISPOSABLE) ×2 IMPLANT
GOWN PREVENTION PLUS XLARGE (GOWN DISPOSABLE) ×4 IMPLANT
LASSO CRESCENT QUICKPASS (SUTURE) IMPLANT
NDL SUT 6 .5 CRC .975X.05 MAYO (NEEDLE) IMPLANT
NEEDLE 1/2 CIR CATGUT .05X1.09 (NEEDLE) IMPLANT
NEEDLE MAYO TAPER (NEEDLE)
NEEDLE SCORPION MULTI FIRE (NEEDLE) IMPLANT
NS IRRIG 1000ML POUR BTL (IV SOLUTION) IMPLANT
PACK ARTHROSCOPY DSU (CUSTOM PROCEDURE TRAY) ×2 IMPLANT
PACK BASIN DAY SURGERY FS (CUSTOM PROCEDURE TRAY) ×2 IMPLANT
PENCIL BUTTON HOLSTER BLD 10FT (ELECTRODE) IMPLANT
RESECTOR FULL RADIUS 4.2MM (BLADE) ×2 IMPLANT
SLEEVE SCD COMPRESS KNEE MED (MISCELLANEOUS) ×2 IMPLANT
SLING ARM FOAM STRAP LRG (SOFTGOODS) IMPLANT
SLING ARM FOAM STRAP MED (SOFTGOODS) ×2 IMPLANT
SLING ARM FOAM STRAP XLG (SOFTGOODS) IMPLANT
SLING ARM IMMOBILIZER MED (SOFTGOODS) IMPLANT
SPONGE GAUZE 4X4 12PLY (GAUZE/BANDAGES/DRESSINGS) ×2 IMPLANT
SPONGE LAP 4X18 X RAY DECT (DISPOSABLE) IMPLANT
STRIP CLOSURE SKIN 1/2X4 (GAUZE/BANDAGES/DRESSINGS) IMPLANT
SUCTION FRAZIER TIP 10 FR DISP (SUCTIONS) IMPLANT
SUPPORT WRAP ARM LG (MISCELLANEOUS) IMPLANT
SUT 2 FIBERLOOP 20 STRT BLUE (SUTURE)
SUT BONE WAX W31G (SUTURE) IMPLANT
SUT ETHIBOND 2 OS 4 DA (SUTURE) IMPLANT
SUT ETHILON 3 0 PS 1 (SUTURE) IMPLANT
SUT ETHILON 4 0 PS 2 18 (SUTURE) ×2 IMPLANT
SUT FIBERWIRE #2 38 T-5 BLUE (SUTURE)
SUT MNCRL AB 3-0 PS2 18 (SUTURE) IMPLANT
SUT MNCRL AB 4-0 PS2 18 (SUTURE) IMPLANT
SUT PDS AB 0 CT 36 (SUTURE) IMPLANT
SUT PROLENE 3 0 PS 2 (SUTURE) IMPLANT
SUT VIC AB 0 CT1 27 (SUTURE)
SUT VIC AB 0 CT1 27XBRD ANBCTR (SUTURE) IMPLANT
SUT VIC AB 2-0 SH 27 (SUTURE)
SUT VIC AB 2-0 SH 27XBRD (SUTURE) IMPLANT
SUTURE 2 FIBERLOOP 20 STRT BLU (SUTURE) IMPLANT
SUTURE FIBERWR #2 38 T-5 BLUE (SUTURE) IMPLANT
SYR BULB 3OZ (MISCELLANEOUS) IMPLANT
TOWEL OR 17X24 6PK STRL BLUE (TOWEL DISPOSABLE) ×2 IMPLANT
TOWEL OR NON WOVEN STRL DISP B (DISPOSABLE) ×2 IMPLANT
TUBE CONNECTING 20X1/4 (TUBING) ×2 IMPLANT
TUBING ARTHROSCOPY IRRIG 16FT (MISCELLANEOUS) ×2 IMPLANT
WAND STAR VAC 90 (SURGICAL WAND) ×2 IMPLANT
WATER STERILE IRR 1000ML POUR (IV SOLUTION) ×2 IMPLANT
YANKAUER SUCT BULB TIP NO VENT (SUCTIONS) IMPLANT

## 2013-04-23 NOTE — Transfer of Care (Signed)
Immediate Anesthesia Transfer of Care Note  Patient: Meghan Hickman  Procedure(s) Performed: Procedure(s): SHOULDER ARTHROSCOPY WITH SUBACROMIAL DECOMPRESSION AND BICEP TENDON TENOTOMY (Right)  Patient Location: PACU  Anesthesia Type:General and Regional  Level of Consciousness: sedated  Airway & Oxygen Therapy: Patient Spontanous Breathing and Patient connected to face mask oxygen  Post-op Assessment: Report given to PACU RN and Post -op Vital signs reviewed and stable  Post vital signs: Reviewed and stable  Complications: No apparent anesthesia complications

## 2013-04-23 NOTE — Anesthesia Procedure Notes (Addendum)
Anesthesia Regional Block:  Interscalene brachial plexus block  Pre-Anesthetic Checklist: ,, timeout performed, Correct Patient, Correct Site, Correct Laterality, Correct Procedure, Correct Position, site marked, Risks and benefits discussed, pre-op evaluation,  At surgeon's request and post-op pain management  Laterality: Right  Prep: Maximum Sterile Barrier Precautions used and chloraprep       Needles:  Injection technique: Single-shot  Needle Type: Echogenic Stimulator Needle      Needle Gauge: 22 and 22 G    Additional Needles:  Procedures: ultrasound guided (picture in chart) and nerve stimulator Interscalene brachial plexus block  Nerve Stimulator or Paresthesia:  Response: Biceps response, 0.4 mA,   Additional Responses:   Narrative:  Start time: 04/23/2013 10:55 AM End time: 04/23/2013 11:10 AM Injection made incrementally with aspirations every 5 mL. Anesthesiologist: Sampson Goon, MD  Additional Notes: 2% Lidocaine skin wheel.   Interscalene brachial plexus block Procedure Name: Intubation Date/Time: 04/23/2013 11:45 AM Performed by: Zenia Resides D Pre-anesthesia Checklist: Patient identified, Emergency Drugs available, Suction available and Patient being monitored Patient Re-evaluated:Patient Re-evaluated prior to inductionOxygen Delivery Method: Circle System Utilized Preoxygenation: Pre-oxygenation with 100% oxygen Intubation Type: IV induction Ventilation: Mask ventilation without difficulty Laryngoscope Size: Mac and 3 Grade View: Grade II Tube type: Oral Tube size: 7.0 mm Number of attempts: 1 Airway Equipment and Method: stylet and oral airway Placement Confirmation: ETT inserted through vocal cords under direct vision,  positive ETCO2 and breath sounds checked- equal and bilateral Secured at: 22 cm Tube secured with: Tape Dental Injury: Teeth and Oropharynx as per pre-operative assessment

## 2013-04-23 NOTE — Anesthesia Postprocedure Evaluation (Signed)
  Anesthesia Post-op Note  Patient: Meghan Hickman  Procedure(s) Performed: Procedure(s): SHOULDER ARTHROSCOPY WITH SUBACROMIAL DECOMPRESSION AND BICEP TENDON TENOTOMY (Right)  Patient Location: PACU  Anesthesia Type:GA combined with regional for post-op pain  Level of Consciousness: awake and alert   Airway and Oxygen Therapy: Patient Spontanous Breathing and Patient connected to nasal cannula oxygen  Post-op Pain: none  Post-op Assessment: Post-op Vital signs reviewed, Patient's Cardiovascular Status Stable, Respiratory Function Stable, Patent Airway and No signs of Nausea or vomiting  Post-op Vital Signs: Reviewed and stable  Complications: No apparent anesthesia complications

## 2013-04-23 NOTE — Op Note (Signed)
Procedure(s): SHOULDER ARTHROSCOPY WITH SUBACROMIAL DECOMPRESSION AND BICEP TENDON TENOTOMY Procedure Note  Meghan Hickman female 62 y.o. 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 joint arthritis Surgeon(s) and Role:    * Mable Paris, MD - Primary     Surgeon: Mable Paris   Assistants: Damita Lack PA-C  Anesthesia: General endotracheal anesthesia with preoperative interscalene block     Procedure Detail  SHOULDER ARTHROSCOPY WITH SUBACROMIAL DECOMPRESSION AND BICEP TENDON TENOTOMY  Estimated Blood Loss: Min         Drains: none  Blood Given: none         Specimens: none        Complications:  * No complications entered in OR log *         Disposition: PACU - hemodynamically stable. she will be kept overnight for observation pain control         Condition: stable    Procedure:   INDICATIONS FOR SURGERY: The patient is 62 y.o. female who has had a long history of right shoulder problems. She has a irreparable rotator cuff tear on the right side with chronic pain and mechanical symptoms. She also had symptomatic a.c. joint DJD. She has some early glenohumeral arthropathy developing but in an attempt to try and avoid a shoulder replacement she want to go forward with the debridement procedure for pain relief. She understood risks benefits alternatives to the procedure including but not limited to risk of bleeding infection damage to neurovascular  structures and the possibility of incomplete pain relief.  OPERATIVE FINDINGS: Examination under anesthesia: Range of motion is approximately 160 forward flexion, external rotation to about 35. Diagnostic Arthroscopy:  Glenoid articular cartilage: Diffuse thinning with some grade 2 and grade 3 changes. Humeral head articular cartilage: Mostly grade 3 changes with a few small flaps of cartilage which were debrided. Labrum: Degenerative tears debridement Loose bodies: None Synovitis: Extensive, debrided  rotator cuff: She and extensive rotator cuff tear involving supraspinatus and infraspinatus retracted beyond the level of the glenoid. This was not repairable. It was debrided. The undersurface of the acromion and significant wear indicating articulation with the superior humeral head. There is significant anterior and lateral downsloping which was addressed with a bur. This opened up the subacromial space. She was noted to have severe bone-on-bone arthritis of the a.c. joint which was addressed with a clavicle excision removing about 8 mm of the clavicle and a smooth even fashion.  DESCRIPTION OF PROCEDURE: The patient was identified in preoperative  holding area where I personally marked the operative site after  verifying site, side, and procedure with the patient. An interscalene block was given by the attending anesthesiologist the holding area.  The patient was taken back to the operating room where general anesthesia was induced without complication and was placed in the beach-chair position with the back  elevated about 60 degrees and all extremities and head and neck carefully padded and  positioned.   The right upper extremity was then prepped and  draped in a standard sterile fashion. The appropriate time-out  procedure was carried out. The patient did receive  IV antibiotics  within 30 minutes of incision.   A small posterior portal incision was made and the arthroscope was  introduced into the joint. An anterior portal was then established above the subscapularis using needle localization. Small cannula was placed anteriorly. Diagnostic arthroscopy was then carried out with findings as described above.  She was noted to have extensive longitudinal tearing and splaying of the long head biceps tendon. This was felt to be potentially a pain generator in the setting of her chronic irreparable rotator cuff tear. Therefore a biceps tenotomy was carried out through the anterior intra-articular portal. Glenohumeral joint surfaces were examined. There is significant irregularity of the humeral head with some grade 2 and 3 changes. There were several small flaps of cartilage which was debrided with the shaver. The glenoid and no significant chondral flaps of the cartilage is noted be diffusely thinned. The rotator cuff was examined and found to be completely torn and retracted beyond the level of the glenoid. There is extensive synovitis throughout the joint which was debrided with the shaver and the ArthriCare.  The arthroscope was then introduced into the subacromial space a standard lateral portal was established with needle localization. The space between the humeral head and the acromion was noted to be very tight. The anterolateral acromion and wear signs consistent with articulation with the humeral head. There is no discernible coracoacromial ligament. The bur was used through the lateral portal to resect the lateral downsloping spur in the anterior downsloping spur on the acromion. She did appear to have an os acromiale which was not entered.  The distal clavicle was exposed arthroscopically and the bur was used to take off the undersurface for approximately 8 mm from the lateral portal. The bur was then moved to an anterior portal position to complete the distal clavicle excision resecting about 8 mm of the distal clavicle and a smooth even fashion. This was viewed from anterior  and lateral portals and felt to be complete.  Rotator cuff there was viewed from the lateral portal. Extended around posteriorly quite a ways. The subscapularis was partially torn but not retracted and was debrided. The supraspinatus was retracted beyond the level of the glenoid was debrided.  The arthroscopic equipment was removed from the joint and the portals were closed with 3-0 nylon in an interrupted fashion. Sterile dressings were then applied including Xeroform 4 x 4's ABDs and tape. The patient was then allowed to awaken from general anesthesia, placed in a sling, transferred to the stretcher and taken to the recovery room in stable condition.   POSTOPERATIVE PLAN: The patient will be discharged home today and will followup in one week for suture removal and wound check.  We will get her into physical therapy.

## 2013-04-23 NOTE — H&P (Signed)
Meghan Hickman is an 62 y.o. female.   Chief Complaint: R shoulder pain HPI: R shoulder chronic irrepairable rotator cuff tear with early arthritis.  Pain interferes with quality of life and sleep.  Failed conservative management.  Past Medical History  Diagnosis Date  . Arthritis   . Diabetes mellitus without complication   . Hyperlipidemia   . Heart murmur   . Hypertension   . Wears glasses     History reviewed. No pertinent past surgical history.  History reviewed. No pertinent family history. Social History:  reports that she quit smoking about 8 months ago. Her smoking use included Cigarettes. She started smoking about a year ago. She has a 2 pack-year smoking history. She does not have any smokeless tobacco history on file. She reports that she does not drink alcohol or use illicit drugs.  Allergies: No Known Allergies  Medications Prior to Admission  Medication Sig Dispense Refill  . aspirin 81 MG tablet Take 81 mg by mouth daily.      [name] Kitchen atorvastatin (LIPITOR) 40 MG tablet Take 1 tablet (40 mg total) by mouth daily.  30 tablet  5  . losartan-hydrochlorothiazide (HYZAAR) 50-12.5 MG per tablet Take 1 tablet by mouth daily.  30 tablet  5  . metFORMIN (GLUCOPHAGE) 500 MG tablet Take 1 tablet (500 mg total) by mouth daily.  30 tablet  5  . traMADol (ULTRAM) 50 MG tablet Take 50 mg by mouth every 6 (six) hours as needed for pain.      [name] Kitchen venlafaxine (EFFEXOR) 50 MG tablet Take 1 tablet (50 mg total) by mouth daily.  30 tablet  3  . zolpidem (AMBIEN) 5 MG tablet Take 1 tablet (5 mg total) by mouth at bedtime as needed for sleep. Take one/half to one tab at bedtime as needed  30 tablet  5    No results found for this or any previous visit (from the past 48 hour(s)). No results found.  Review of Systems  All other systems reviewed and are negative.    Blood pressure 116/61, pulse 96, temperature 98.2 F (36.8 C), temperature source Oral, resp. rate 15, height 5' (1.524 m),  weight 65.772 kg (145 lb), SpO2 99.00%. Physical Exam  Constitutional: She is oriented to person, place, and time. She appears well-developed and well-nourished.  HENT:  Head: Atraumatic.  Eyes: EOM are normal.  Cardiovascular: Intact distal pulses.   Respiratory: Effort normal.  Musculoskeletal:       Right shoulder: She exhibits decreased range of motion, tenderness, pain and decreased strength.  Neurological: She is alert and oriented to person, place, and time.  Skin: Skin is warm and dry.  Psychiatric: She has a normal mood and affect.     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 Risks / benefits of surgery discussed Consent on chart  NPO for OR Preop antibiotics   Monica Zahler WILLIAM 04/23/2013, 11:32 AM

## 2013-04-23 NOTE — Progress Notes (Signed)
Assisted Dr. Fitzgerald with right, ultrasound guided, interscalene  block. Side rails up, monitors on throughout procedure. See vital signs in flow sheet. Tolerated Procedure well. 

## 2013-04-23 NOTE — Anesthesia Preprocedure Evaluation (Addendum)
Anesthesia Evaluation  Patient identified by MRN, date of birth, ID band Patient awake    Reviewed: Allergy & Precautions, H&P , NPO status , Patient's Chart, lab work & pertinent test results  Airway Mallampati: II TM Distance: >3 FB Neck ROM: Full    Dental no notable dental hx. (+) Partial Upper and Dental Advisory Given   Pulmonary neg pulmonary ROS,  breath sounds clear to auscultation  Pulmonary exam normal       Cardiovascular hypertension, On Medications Rhythm:Regular Rate:Normal     Neuro/Psych negative neurological ROS  negative psych ROS   GI/Hepatic negative GI ROS, Neg liver ROS,   Endo/Other  negative endocrine ROSdiabetes, Type 2, Oral Hypoglycemic Agents  Renal/GU negative Renal ROS  negative genitourinary   Musculoskeletal   Abdominal   Peds  Hematology negative hematology ROS (+)   Anesthesia Other Findings   Reproductive/Obstetrics negative OB ROS                          Anesthesia Physical Anesthesia Plan  ASA: II  Anesthesia Plan: General and Regional   Post-op Pain Management:    Induction: Intravenous  Airway Management Planned: Oral ETT  Additional Equipment:   Intra-op Plan:   Post-operative Plan: Extubation in OR  Informed Consent: I have reviewed the patients History and Physical, chart, labs and discussed the procedure including the risks, benefits and alternatives for the proposed anesthesia with the patient or authorized representative who has indicated his/her understanding and acceptance.   Dental advisory given  Plan Discussed with: CRNA  Anesthesia Plan Comments:         Anesthesia Quick Evaluation

## 2013-04-24 ENCOUNTER — Encounter (HOSPITAL_BASED_OUTPATIENT_CLINIC_OR_DEPARTMENT_OTHER): Payer: Self-pay | Admitting: Orthopedic Surgery

## 2013-04-24 LAB — POCT I-STAT, CHEM 8
Creatinine, Ser: 1.2 mg/dL — ABNORMAL HIGH (ref 0.50–1.10)
Glucose, Bld: 99 mg/dL (ref 70–99)
HCT: 41 % (ref 36.0–46.0)
Hemoglobin: 13.9 g/dL (ref 12.0–15.0)
Sodium: 144 mEq/L (ref 135–145)
TCO2: 26 mmol/L (ref 0–100)

## 2013-04-24 NOTE — Progress Notes (Signed)
PATIENT ID: Doralee Albino Curci   1 Day Post-Op Procedure(s) (LRB): SHOULDER ARTHROSCOPY WITH SUBACROMIAL DECOMPRESSION AND BICEP TENDON TENOTOMY (Right)  Subjective: Doing well today. C/o pain in right shoulder but well controlled with oral meds. Ready to go home, no other complaints or concerns.  Objective:  Filed Vitals:   04/24/13 0640  BP: 123/72  Pulse: 94  Temp: 98.3 F (36.8 C)  Resp: 16     Awake, alert, orientated R UE dressing c/d/i In sling Wiggles fingers, distally nvi  Labs:   Recent Labs  04/23/13 0804  HGB 13.9   Recent Labs  04/23/13 0804  HCT 41.0   Recent Labs  04/23/13 0804  NA 144  K 3.5  CL 106  BUN 18  CREATININE 1.20*  GLUCOSE 99    Assessment and Plan: 1 day s/p shoulder arthroscopy Okay to d/c home today Percocet for home pain control. Script in chart Fu with Dr. Ave Filter in 1 week  VTE proph: ASA BID, SCDs

## 2013-09-05 ENCOUNTER — Other Ambulatory Visit: Payer: Self-pay | Admitting: Physician Assistant

## 2014-01-08 ENCOUNTER — Ambulatory Visit (INDEPENDENT_AMBULATORY_CARE_PROVIDER_SITE_OTHER): Payer: BC Managed Care – PPO | Admitting: Emergency Medicine

## 2014-01-08 VITALS — BP 136/82 | HR 95 | Temp 97.7°F | Resp 18 | Ht 60.0 in | Wt 144.8 lb

## 2014-01-08 DIAGNOSIS — G8929 Other chronic pain: Secondary | ICD-10-CM

## 2014-01-08 DIAGNOSIS — E119 Type 2 diabetes mellitus without complications: Secondary | ICD-10-CM

## 2014-01-08 DIAGNOSIS — F329 Major depressive disorder, single episode, unspecified: Secondary | ICD-10-CM

## 2014-01-08 DIAGNOSIS — G4700 Insomnia, unspecified: Secondary | ICD-10-CM

## 2014-01-08 DIAGNOSIS — I10 Essential (primary) hypertension: Secondary | ICD-10-CM

## 2014-01-08 DIAGNOSIS — F32A Depression, unspecified: Secondary | ICD-10-CM

## 2014-01-08 DIAGNOSIS — E782 Mixed hyperlipidemia: Secondary | ICD-10-CM

## 2014-01-08 DIAGNOSIS — R1013 Epigastric pain: Secondary | ICD-10-CM

## 2014-01-08 LAB — POCT UA - MICROSCOPIC ONLY
CASTS, UR, LPF, POC: NEGATIVE
Crystals, Ur, HPF, POC: NEGATIVE
EPITHELIAL CELLS, URINE PER MICROSCOPY: NEGATIVE
MUCUS UA: NEGATIVE
WBC, UR, HPF, POC: NEGATIVE
YEAST UA: NEGATIVE

## 2014-01-08 LAB — POCT CBC
GRANULOCYTE PERCENT: 60.5 % (ref 37–80)
HCT, POC: 41.9 % (ref 37.7–47.9)
Hemoglobin: 12.7 g/dL (ref 12.2–16.2)
LYMPH, POC: 2.7 (ref 0.6–3.4)
MCH: 26.1 pg — AB (ref 27–31.2)
MCHC: 30.3 g/dL — AB (ref 31.8–35.4)
MCV: 86.2 fL (ref 80–97)
MID (CBC): 0.5 (ref 0–0.9)
MPV: 8.7 fL (ref 0–99.8)
PLATELET COUNT, POC: 403 10*3/uL (ref 142–424)
POC Granulocyte: 4.8 (ref 2–6.9)
POC LYMPH %: 33.2 % (ref 10–50)
POC MID %: 6.3 %M (ref 0–12)
RBC: 4.86 M/uL (ref 4.04–5.48)
RDW, POC: 14.1 %
WBC: 8 10*3/uL (ref 4.6–10.2)

## 2014-01-08 LAB — MICROALBUMIN, URINE: Microalb, Ur: 1.04 mg/dL (ref 0.00–1.89)

## 2014-01-08 LAB — LIPID PANEL
CHOL/HDL RATIO: 4.1 ratio
CHOLESTEROL: 208 mg/dL — AB (ref 0–200)
HDL: 51 mg/dL (ref >39)
LDL Cholesterol: 145 mg/dL — ABNORMAL HIGH (ref 0–99)
TRIGLYCERIDES: 59 mg/dL (ref <150)
VLDL: 12 mg/dL (ref 0–40)

## 2014-01-08 LAB — COMPREHENSIVE METABOLIC PANEL
ALT: 13 U/L (ref 0–35)
AST: 18 U/L (ref 0–37)
Albumin: 4.5 g/dL (ref 3.5–5.2)
Alkaline Phosphatase: 75 U/L (ref 39–117)
BILIRUBIN TOTAL: 0.6 mg/dL (ref 0.2–1.2)
BUN: 13 mg/dL (ref 6–23)
CALCIUM: 9.9 mg/dL (ref 8.4–10.5)
CHLORIDE: 104 meq/L (ref 96–112)
CO2: 27 meq/L (ref 19–32)
CREATININE: 0.85 mg/dL (ref 0.50–1.10)
GLUCOSE: 112 mg/dL — AB (ref 70–99)
Potassium: 4.1 mEq/L (ref 3.5–5.3)
Sodium: 141 mEq/L (ref 135–145)
Total Protein: 7.3 g/dL (ref 6.0–8.3)

## 2014-01-08 LAB — AMYLASE: Amylase: 34 U/L (ref 0–105)

## 2014-01-08 LAB — POCT URINALYSIS DIPSTICK
BILIRUBIN UA: NEGATIVE
Blood, UA: NEGATIVE
Glucose, UA: NEGATIVE
KETONES UA: NEGATIVE
LEUKOCYTES UA: NEGATIVE
Nitrite, UA: NEGATIVE
PH UA: 5.5
PROTEIN UA: NEGATIVE
SPEC GRAV UA: 1.025
Urobilinogen, UA: 0.2

## 2014-01-08 LAB — LIPASE: LIPASE: 18 U/L (ref 0–75)

## 2014-01-08 LAB — GLUCOSE, POCT (MANUAL RESULT ENTRY): POC Glucose: 124 mg/dl — AB (ref 70–99)

## 2014-01-08 LAB — POCT GLYCOSYLATED HEMOGLOBIN (HGB A1C): Hemoglobin A1C: 5.6

## 2014-01-08 MED ORDER — METFORMIN HCL 500 MG PO TABS: 500.0000 mg | ORAL_TABLET | Freq: Every day | ORAL | Status: DC

## 2014-01-08 MED ORDER — LOSARTAN POTASSIUM-HCTZ 50-12.5 MG PO TABS: 1.0000 | ORAL_TABLET | Freq: Every day | ORAL | Status: DC

## 2014-01-08 MED ORDER — VENLAFAXINE HCL 50 MG PO TABS: 50.0000 mg | ORAL_TABLET | Freq: Every day | ORAL | Status: DC

## 2014-01-08 MED ORDER — LANSOPRAZOLE 30 MG PO CPDR: 30.0000 mg | DELAYED_RELEASE_CAPSULE | Freq: Every day | ORAL | Status: DC

## 2014-01-08 MED ORDER — SUCRALFATE 1 G PO TABS: ORAL_TABLET | ORAL | Status: DC

## 2014-01-08 MED ORDER — ATORVASTATIN CALCIUM 40 MG PO TABS: 40.0000 mg | ORAL_TABLET | Freq: Every day | ORAL | Status: DC

## 2014-01-08 MED ORDER — TEMAZEPAM 30 MG PO CAPS: 30.0000 mg | ORAL_CAPSULE | Freq: Every evening | ORAL | Status: DC | PRN

## 2014-01-08 NOTE — Patient Instructions (Signed)
Gastroesophageal Reflux Disease, Adult  Gastroesophageal reflux disease (GERD) happens when acid from your stomach flows up into the esophagus. When acid comes in contact with the esophagus, the acid causes soreness (inflammation) in the esophagus. Over time, GERD may create small holes (ulcers) in the lining of the esophagus.  CAUSES   · Increased body weight. This puts pressure on the stomach, making acid rise from the stomach into the esophagus.  · Smoking. This increases acid production in the stomach.  · Drinking alcohol. This causes decreased pressure in the lower esophageal sphincter (valve or ring of muscle between the esophagus and stomach), allowing acid from the stomach into the esophagus.  · Late evening meals and a full stomach. This increases pressure and acid production in the stomach.  · A malformed lower esophageal sphincter.  Sometimes, no cause is found.  SYMPTOMS   · Burning pain in the lower part of the mid-chest behind the breastbone and in the mid-stomach area. This may occur twice a week or more often.  · Trouble swallowing.  · Sore throat.  · Dry cough.  · Asthma-like symptoms including chest tightness, shortness of breath, or wheezing.  DIAGNOSIS   Your caregiver may be able to diagnose GERD based on your symptoms. In some cases, X-rays and other tests may be done to check for complications or to check the condition of your stomach and esophagus.  TREATMENT   Your caregiver may recommend over-the-counter or prescription medicines to help decrease acid production. Ask your caregiver before starting or adding any new medicines.   HOME CARE INSTRUCTIONS   · Change the factors that you can control. Ask your caregiver for guidance concerning weight loss, quitting smoking, and alcohol consumption.  · Avoid foods and drinks that make your symptoms worse, such as:  · Caffeine or alcoholic drinks.  · Chocolate.  · Peppermint or mint flavorings.  · Garlic and onions.  · Spicy foods.  · Citrus fruits,  such as oranges, lemons, or limes.  · Tomato-based foods such as sauce, chili, salsa, and pizza.  · Fried and fatty foods.  · Avoid lying down for the 3 hours prior to your bedtime or prior to taking a nap.  · Eat small, frequent meals instead of large meals.  · Wear loose-fitting clothing. Do not wear anything tight around your waist that causes pressure on your stomach.  · Raise the head of your bed 6 to 8 inches with wood blocks to help you sleep. Extra pillows will not help.  · Only take over-the-counter or prescription medicines for pain, discomfort, or fever as directed by your caregiver.  · Do not take aspirin, ibuprofen, or other nonsteroidal anti-inflammatory drugs (NSAIDs).  SEEK IMMEDIATE MEDICAL CARE IF:   · You have pain in your arms, neck, jaw, teeth, or back.  · Your pain increases or changes in intensity or duration.  · You develop nausea, vomiting, or sweating (diaphoresis).  · You develop shortness of breath, or you faint.  · Your vomit is green, yellow, black, or looks like coffee grounds or blood.  · Your stool is red, bloody, or black.  These symptoms could be signs of other problems, such as heart disease, gastric bleeding, or esophageal bleeding.  MAKE SURE YOU:   · Understand these instructions.  · Will watch your condition.  · Will get help right away if you are not doing well or get worse.  Document Released: 05/05/2005 Document Revised: 10/18/2011 Document Reviewed: 02/12/2011  ExitCare® Patient   Information ©2014 [employer].

## 2014-01-08 NOTE — Progress Notes (Signed)
[facility] [address], [place] [address] [phone]  Date:  01/08/2014   Name:  Meghan Hickman   DOB:  Dec 23, 1950   MRN:  [id number]  PCP:  Lucilla Edin, MD    Chief Complaint: Diverticulosis, Hyperlipidemia and Diabetes   History of Present Illness:  Meghan Hickman is a 63 y.o. very pleasant female patient who presents with the following:  Ill with abdominal pain and nausea on a regular basis.  Relieved by tums and liquids.  No vomiting.  No stool change.  Concerned she may have diverticulitis as she was found to have diverticulosis on colonoscopy 5 years ago.  Not associated with stool change, blood in stool, or fever and chills.  Never seen for this.  Tolerating her meds for DM and HLD well. No improvement with over the counter medications or other home remedies. Denies other complaint or health concern today.   Patient Active Problem List   Diagnosis Date Noted  . Heart murmur 02/11/2013  . Type II or unspecified type diabetes mellitus without mention of complication, not stated as uncontrolled 02/09/2013  . Other and unspecified hyperlipidemia 02/09/2013    Past Medical History  Diagnosis Date  . Arthritis   . Diabetes mellitus without complication   . Hyperlipidemia   . Heart murmur   . Hypertension   . Wears glasses     Past Surgical History  Procedure Laterality Date  . Shoulder arthroscopy with subacromial decompression and bicep tendon repair Right 04/23/2013    Procedure: SHOULDER ARTHROSCOPY WITH SUBACROMIAL DECOMPRESSION AND BICEP TENDON TENOTOMY;  Surgeon: Mable Paris, MD;  Location: [facility];  Service: Orthopedics;  Laterality: Right;    History  Substance Use Topics  . Smoking status: Former Smoker -- 0.20 packs/day for 10 years    Types: Cigarettes    Start date: 04/30/2012    Quit date: 08/16/2012  . Smokeless tobacco: Not on file  . Alcohol Use: No    History reviewed. No pertinent  family history.  No Known Allergies  Medication list has been reviewed and updated.  Current Outpatient Prescriptions on File Prior to Visit  Medication Sig Dispense Refill  . aspirin 81 MG tablet Take 81 mg by mouth daily.      [name] Kitchen atorvastatin (LIPITOR) 40 MG tablet Take 1 tablet (40 mg total) by mouth daily.  30 tablet  5  . losartan-hydrochlorothiazide (HYZAAR) 50-12.5 MG per tablet Take 1 tablet by mouth daily.  30 tablet  5  . metFORMIN (GLUCOPHAGE) 500 MG tablet Take 1 tablet (500 mg total) by mouth daily.  30 tablet  5  . traMADol (ULTRAM) 50 MG tablet Take 50 mg by mouth every 6 (six) hours as needed for pain.      [name] Kitchen venlafaxine (EFFEXOR) 50 MG tablet Take 1 tablet (50 mg total) by mouth daily.  30 tablet  3  . venlafaxine (EFFEXOR) 50 MG tablet Take 1 tablet (50 mg total) by mouth daily. PATIENT NEEDS OFFICE VISIT FOR ADDITIONAL REFILLS  30 tablet  0  . zolpidem (AMBIEN) 5 MG tablet Take 1 tablet (5 mg total) by mouth at bedtime as needed for sleep. Take one/half to one tab at bedtime as needed  30 tablet  5  . oxyCODONE-acetaminophen (ROXICET) 5-325 MG per tablet Take 1-2 tablets by mouth every 4 (four) hours as needed for pain.  60 tablet  0   No current facility-administered medications on file prior to  visit.    Review of Systems:  As per HPI, otherwise negative.    Physical Examination: Filed Vitals:   01/08/14 0918  BP: 136/82  Pulse: 95  Temp: 97.7 F (36.5 C)  Resp: 18   Filed Vitals:   01/08/14 0918  Height: 5' (1.524 m)  Weight: 144 lb 12.8 oz (65.681 kg)   Body mass index is 28.28 kg/(m^2). Ideal Body Weight: Weight in (lb) to have BMI = 25: 127.7  GEN: WDWN, NAD, Non-toxic, A & O x 3 HEENT: Atraumatic, Normocephalic. Neck supple. No masses, No LAD. Ears and Nose: No external deformity. CV: RRR, No M/G/R. No JVD. No thrill. No extra heart sounds. PULM: CTA B, no wheezes, crackles, rhonchi. No retractions. No resp. distress. No accessory muscle  use. ABD: S, RUQ and epigastric tenderness, ND, +BS. No rebound. No HSM. EXTR: No c/c/e NEURO Normal gait.  PSYCH: Normally interactive. Conversant. Not depressed or anxious appearing.  Calm demeanor.    Assessment and Plan: NIDDM GERD HBP  Signed,  Phillips OdorJeffery Achaia Garlock, MD   Results for orders placed in visit on 01/08/14  POCT CBC      Result Value Ref Range   WBC 8.0  4.6 - 10.2 K/uL   Lymph, poc 2.7  0.6 - 3.4   POC LYMPH PERCENT 33.2  10 - 50 %L   MID (cbc) 0.5  0 - 0.9   POC MID % 6.3  0 - 12 %M   POC Granulocyte 4.8  2 - 6.9   Granulocyte percent 60.5  37 - 80 %G   RBC 4.86  4.04 - 5.48 M/uL   Hemoglobin 12.7  12.2 - 16.2 g/dL   HCT, POC 40.941.9  81.137.7 - 47.9 %   MCV 86.2  80 - 97 fL   MCH, POC 26.1 (*) 27 - 31.2 pg   MCHC 30.3 (*) 31.8 - 35.4 g/dL   RDW, POC 91.414.1     Platelet Count, POC 403  142 - 424 K/uL   MPV 8.7  0 - 99.8 fL  GLUCOSE, POCT (MANUAL RESULT ENTRY)      Result Value Ref Range   POC Glucose 124 (*) 70 - 99 mg/dl  POCT GLYCOSYLATED HEMOGLOBIN (HGB A1C)      Result Value Ref Range   Hemoglobin A1C 5.6    POCT URINALYSIS DIPSTICK      Result Value Ref Range   Color, UA yellow     Clarity, UA clear     Glucose, UA neg     Bilirubin, UA neg     Ketones, UA neg     Spec Grav, UA 1.025     Blood, UA neg     pH, UA 5.5     Protein, UA neg     Urobilinogen, UA 0.2     Nitrite, UA neg     Leukocytes, UA Negative    POCT UA - MICROSCOPIC ONLY      Result Value Ref Range   WBC, Ur, HPF, POC neg     RBC, urine, microscopic 0-2     Bacteria, U Microscopic trace     Mucus, UA neg     Epithelial cells, urine per micros neg     Crystals, Ur, HPF, POC neg     Casts, Ur, LPF, POC neg     Yeast, UA neg

## 2014-01-09 LAB — H. PYLORI ANTIBODY, IGG: H Pylori IgG: 0.47 {ISR}

## 2014-01-14 ENCOUNTER — Other Ambulatory Visit: Payer: Self-pay

## 2014-01-14 ENCOUNTER — Ambulatory Visit
Admission: RE | Admit: 2014-01-14 | Discharge: 2014-01-14 | Disposition: A | Discharge status: Home / Self Care | Payer: Federal, State, Local not specified - PPO | Source: Ambulatory Visit | Attending: Emergency Medicine | Admitting: Emergency Medicine

## 2014-01-14 DIAGNOSIS — G8929 Other chronic pain: Secondary | ICD-10-CM

## 2014-01-14 DIAGNOSIS — R1013 Epigastric pain: Principal | ICD-10-CM

## 2014-01-16 ENCOUNTER — Other Ambulatory Visit: Payer: Self-pay | Admitting: Emergency Medicine

## 2014-01-16 DIAGNOSIS — R1011 Right upper quadrant pain: Secondary | ICD-10-CM

## 2014-01-18 ENCOUNTER — Telehealth: Payer: Self-pay

## 2014-01-18 NOTE — Telephone Encounter (Signed)
Spoke with pt. Notified of US results and the need for surgical consult. Also mailed her a copy of her labs. She will call [facility] and make an appt/

## 2014-01-18 NOTE — Telephone Encounter (Signed)
Pt is calling to inquire about an ultrasound she had done last week; please call

## 2014-01-22 ENCOUNTER — Ambulatory Visit (INDEPENDENT_AMBULATORY_CARE_PROVIDER_SITE_OTHER): Payer: Federal, State, Local not specified - PPO | Admitting: General Surgery

## 2014-04-10 ENCOUNTER — Other Ambulatory Visit: Payer: Self-pay | Admitting: Emergency Medicine

## 2014-04-29 LAB — HM MAMMOGRAPHY

## 2014-06-04 ENCOUNTER — Ambulatory Visit (INDEPENDENT_AMBULATORY_CARE_PROVIDER_SITE_OTHER): Payer: BC Managed Care – PPO | Admitting: Family Medicine

## 2014-06-04 VITALS — BP 125/79 | HR 100 | Temp 98.6°F | Resp 18 | Ht 60.0 in | Wt 150.0 lb

## 2014-06-04 DIAGNOSIS — E785 Hyperlipidemia, unspecified: Secondary | ICD-10-CM

## 2014-06-04 DIAGNOSIS — E119 Type 2 diabetes mellitus without complications: Secondary | ICD-10-CM

## 2014-06-04 DIAGNOSIS — R935 Abnormal findings on diagnostic imaging of other abdominal regions, including retroperitoneum: Secondary | ICD-10-CM

## 2014-06-04 DIAGNOSIS — M25511 Pain in right shoulder: Secondary | ICD-10-CM

## 2014-06-04 LAB — POCT GLYCOSYLATED HEMOGLOBIN (HGB A1C): Hemoglobin A1C: 5.6

## 2014-06-04 MED ORDER — TRAMADOL HCL 50 MG PO TABS: 50.0000 mg | ORAL_TABLET | Freq: Four times a day (QID) | ORAL | Status: DC | PRN

## 2014-06-04 NOTE — Progress Notes (Signed)
Chief Complaint:  Chief Complaint  Patient presents with  . Hyperlipidemia    fasting  . Diabetes    HPI: Meghan Hickman is a 63 y.o. female who is here for diabetes recheck and also hyperlipidemia recheck She is not having any low sugars currently but has had them before, she has weakness and tremors when she has hypoglycemia.  She has had about 3-4 times in the last 3 months of hypoglycemic sxs, she does not measure her sugars at home.  Her sugars have been 115-as low as 90, high as 120 She is UTD on her eye exam, she has cataracts, no diabetes complications HOWEVER she dstates she does have some numbness and tingling in her hadns and also her feet, very occ in her feet. She thought this was from her shoulder. She has had DM for the last 3 yeas, it has been well controlled, she used to smoke and odes not smoke now, she deneis any PAD.  She has numbenss and tinglign in her feet and hands x 6 months. No prior hx of this.   She is compliant, 6/7 days with her Hyperlipdiemia meds. No msk aches or pains. No SEs.    Looking at EMR and reviewing records she has had a hx of ruq abd pain and midepigastric pain. [place] was done in 01/2014 and she was told to go see [facility] fore evaulation but thought that she was suppsoed to be scheduled for surgery and so she never made an appt. There was a disconnect in communication somewhere.  [place] resutls below: IMPRESSION:  1. Echogenic sludge versus gallbladder polyps.  2. No evidence for acute cholecystitis.  3. Small liver cyst.  4. Left renal cysts.  5. No hydronephrosis or acute abdominal findings.    Past Medical History  Diagnosis Date  . Arthritis   . Diabetes mellitus without complication   . Hyperlipidemia   . Heart murmur   . Hypertension   . Wears glasses    Past Surgical History  Procedure Laterality Date  . Shoulder arthroscopy with subacromial decompression and bicep tendon repair Right 04/23/2013    Procedure: SHOULDER  ARTHROSCOPY WITH SUBACROMIAL DECOMPRESSION AND BICEP TENDON TENOTOMY;  Surgeon: Mable Paris, MD;  Location: [facility];  Service: Orthopedics;  Laterality: Right;   History   Social History  . Marital Status: Single    Spouse Name: N/A    Number of Children: N/A  . Years of Education: N/A   Social History Main Topics  . Smoking status: Former Smoker -- 0.20 packs/day for 10 years    Types: Cigarettes    Start date: 04/30/2012    Quit date: 08/16/2012  . Smokeless tobacco: None  . Alcohol Use: No  . Drug Use: No  . Sexual Activity: None     Comment: was smoking 1/2 pppd   Other Topics Concern  . None   Social History Narrative  . None   No family history on file. No Known Allergies Prior to Admission medications   Medication Sig Start Date End Date Taking? Authorizing Provider  aspirin 81 MG tablet Take 81 mg by mouth daily.   Yes Historical Provider, MD  atorvastatin (LIPITOR) 40 MG tablet Take 1 tablet (40 mg total) by mouth daily. 01/08/14  Yes Carmelina Dane, MD  lansoprazole (PREVACID) 30 MG capsule Take 1 capsule (30 mg total) by mouth daily. 01/08/14  Yes Carmelina Dane, MD  losartan-hydrochlorothiazide (HYZAAR) 50-12.5 MG  per tablet Take 1 tablet by mouth daily. 01/08/14 01/08/15 Yes Carmelina DaneJeffery S Anderson, MD  metFORMIN (GLUCOPHAGE) 500 MG tablet Take 1 tablet (500 mg total) by mouth daily. 01/08/14  Yes Carmelina DaneJeffery S Anderson, MD  oxyCODONE-acetaminophen (ROXICET) 5-325 MG per tablet Take 1-2 tablets by mouth every 4 (four) hours as needed for pain. 04/23/13  Yes Jiles Haroldanielle Laliberte, PA-C  sucralfate (CARAFATE) 1 G tablet 1 tab 1 h ac and hs 01/08/14  Yes Carmelina DaneJeffery S Anderson, MD  temazepam (RESTORIL) 30 MG capsule Take 1 capsule (30 mg total) by mouth at bedtime as needed for sleep. 01/08/14  Yes Carmelina DaneJeffery S Anderson, MD  traMADol (ULTRAM) 50 MG tablet Take 50 mg by mouth every 6 (six) hours as needed for pain.   Yes Historical Provider, MD  venlafaxine  (EFFEXOR) 50 MG tablet Take 1 tablet (50 mg total) by mouth daily. PATIENT NEEDS OFFICE VISIT FOR ADDITIONAL REFILLS 09/05/13  Yes Eleanore Delia ChimesE Egan, PA-C  zolpidem (AMBIEN) 5 MG tablet Take 1 tablet (5 mg total) by mouth at bedtime as needed for sleep. Take one/half to one tab at bedtime as needed 02/09/13  Yes Collene GobbleSteven A Daub, MD     ROS: The patient denies fevers, chills, night sweats, unintentional weight loss, chest pain, palpitations, wheezing, dyspnea on exertion, nausea, vomiting, abdominal pain, dysuria, hematuria, melena, , weakness,    All other systems have been reviewed and were otherwise negative with the exception of those mentioned in the HPI and as above.    PHYSICAL EXAM: Filed Vitals:   06/04/14 2013  BP: 125/79  Pulse: 100  Temp: 98.6 F (37 C)  Resp: 18   Filed Vitals:   06/04/14 2013  Height: 5' (1.524 m)  Weight: 150 lb (68.04 kg)   Body mass index is 29.3 kg/(m^2).  General: Alert, no acute distress HEENT:  Normocephalic, atraumatic, oropharynx patent. EOMI, PERRLA. fundo exam normal Cardiovascular:  Regular rate and rhythm, no rubs murmurs or gallops.  No Carotid bruits, radial pulse intact. No pedal edema.  Respiratory: Clear to auscultation bilaterally.  No wheezes, rales, or rhonchi.  No cyanosis, no use of accessory musculature GI: No organomegaly, abdomen is soft and non-tender, positive bowel sounds.  No masses. Skin: No rashes. Neurologic: Facial musculature symmetric. Psychiatric: Patient is appropriate throughout our interaction. Lymphatic: No cervical lymphadenopathy Musculoskeletal: Gait intact. 5/5 streng , 2/2 DTRs micorfilament exam nl in both hands and feeet + DP   LABS: Results for orders placed in visit on 06/04/14  COMPLETE METABOLIC PANEL WITH GFR      Result Value Ref Range   Sodium 140  135 - 145 mEq/L   Potassium 3.9  3.5 - 5.3 mEq/L   Chloride 104  96 - 112 mEq/L   CO2 28  19 - 32 mEq/L   Glucose, Bld 120 (*) 70 - 99 mg/dL   BUN  12  6 - 23 mg/dL   Creat 1.610.83  0.960.50 - 0.451.10 mg/dL   Total Bilirubin 0.8  0.2 - 1.2 mg/dL   Alkaline Phosphatase 79  39 - 117 U/L   AST 22  0 - 37 U/L   ALT 19  0 - 35 U/L   Total Protein 6.9  6.0 - 8.3 g/dL   Albumin 4.3  3.5 - 5.2 g/dL   Calcium 8.8  8.4 - 40.910.5 mg/dL   GFR, Est African American 86     GFR, Est Non African American 75    POCT GLYCOSYLATED HEMOGLOBIN (HGB A1C)  Result Value Ref Range   Hemoglobin A1C 5.6       EKG/XRAY:   [facility]   ASSESSMENT/PLAN: Encounter Diagnoses  Name Primary?  . Type 2 diabetes mellitus without complication Yes  . Pain in joint, shoulder region, right   . Hyperlipidemia   . Abnormal US (ultrasound) of abdomen    Pleasant 63 yo female with HTN, DN, Hyperlipdiemia, and also chronic RUG abd pain. She is here for f/u of her DM Hba1c and CMP pending Refer to [facility] for h/o abnormal US Declines flu vaccine Refilled meds including pain meds for shoulder F/u in 3-4 months  Gross sideeffects, risk and benefits, and alternatives of medications d/w patient. Patient is aware that all medications have potential sideeffects and we are unable to predict every sideeffect or drug-drug interaction that may occur.  Rockne CoonsLE, Sims Laday PHUONG, DO 06/06/2014 10:16 AM   !0/29/15 Spoke with patient about labs

## 2014-06-05 LAB — COMPLETE METABOLIC PANEL WITH GFR
ALT: 19 U/L (ref 0–35)
BUN: 12 mg/dL (ref 6–23)
CO2: 28 mEq/L (ref 19–32)
Calcium: 8.8 mg/dL (ref 8.4–10.5)
Chloride: 104 mEq/L (ref 96–112)
Creat: 0.83 mg/dL (ref 0.50–1.10)
GFR, Est African American: 86 mL/min
GFR, Est Non African American: 75 mL/min
Glucose, Bld: 120 mg/dL — ABNORMAL HIGH (ref 70–99)
Sodium: 140 mEq/L (ref 135–145)
Total Protein: 6.9 g/dL (ref 6.0–8.3)

## 2014-06-05 LAB — COMPLETE METABOLIC PANEL WITHOUT GFR
AST: 22 U/L (ref 0–37)
Albumin: 4.3 g/dL (ref 3.5–5.2)
Alkaline Phosphatase: 79 U/L (ref 39–117)
Potassium: 3.9 meq/L (ref 3.5–5.3)
Total Bilirubin: 0.8 mg/dL (ref 0.2–1.2)

## 2014-06-13 ENCOUNTER — Ambulatory Visit (INDEPENDENT_AMBULATORY_CARE_PROVIDER_SITE_OTHER): Payer: BC Managed Care – PPO | Admitting: Emergency Medicine

## 2014-06-13 VITALS — BP 132/72 | HR 97 | Temp 98.4°F | Resp 18 | Ht 59.5 in | Wt 151.0 lb

## 2014-06-13 DIAGNOSIS — R059 Cough, unspecified: Secondary | ICD-10-CM

## 2014-06-13 DIAGNOSIS — J209 Acute bronchitis, unspecified: Secondary | ICD-10-CM

## 2014-06-13 DIAGNOSIS — J0100 Acute maxillary sinusitis, unspecified: Secondary | ICD-10-CM

## 2014-06-13 DIAGNOSIS — R05 Cough: Secondary | ICD-10-CM

## 2014-06-13 MED ORDER — PSEUDOEPHEDRINE-GUAIFENESIN ER 60-600 MG PO TB12: 1.0000 | ORAL_TABLET | Freq: Two times a day (BID) | ORAL | Status: DC

## 2014-06-13 MED ORDER — AMOXICILLIN-POT CLAVULANATE 875-125 MG PO TABS: 1.0000 | ORAL_TABLET | Freq: Two times a day (BID) | ORAL | Status: DC

## 2014-06-13 MED ORDER — ALBUTEROL SULFATE (2.5 MG/3ML) 0.083% IN NEBU
5.0000 mg | INHALATION_SOLUTION | Freq: Once | RESPIRATORY_TRACT | Status: AC
  Administered 2014-06-13: 5 mg via RESPIRATORY_TRACT

## 2014-06-13 MED ORDER — IPRATROPIUM BROMIDE 0.02 % IN SOLN
0.5000 mg | Freq: Once | RESPIRATORY_TRACT | Status: AC
  Administered 2014-06-13: 0.5 mg via RESPIRATORY_TRACT

## 2014-06-13 MED ORDER — ALBUTEROL SULFATE HFA 108 (90 BASE) MCG/ACT IN AERS: 2.0000 | INHALATION_SPRAY | RESPIRATORY_TRACT | Status: DC | PRN

## 2014-06-13 MED ORDER — PROMETHAZINE-CODEINE 6.25-10 MG/5ML PO SYRP: 5.0000 mL | ORAL_SOLUTION | ORAL | Status: DC | PRN

## 2014-06-13 NOTE — Patient Instructions (Signed)
Metered Dose Inhaler (No Spacer Used)  Inhaled medicines are the basis of treatment for asthma and other breathing problems. Inhaled medicine can only be effective if used properly. Good technique assures that the medicine reaches the lungs.  Metered dose inhalers (MDIs) are used to deliver a variety of inhaled medicines. These include quick relief or rescue medicines (such as bronchodilators) and controller medicines (such as corticosteroids). The medicine is delivered by pushing down on a metal canister to release a set amount of spray.  If you are using different kinds of inhalers, use your quick relief medicine to open the airways 10-15 minutes before using a steroid, if instructed to do so by your health care provider. If you are unsure which inhalers to use and the order of using them, ask your health care provider, nurse, or respiratory therapist.  HOW TO USE THE INHALER  1. Remove the cap from the inhaler.  2. If you are using the inhaler for the first time, you will need to prime it. Shake the inhaler for 5 seconds and release four puffs into the air, away from your face. Ask your health care provider or pharmacist if you have questions about priming your inhaler.  3. Shake the inhaler for 5 seconds before each breath in (inhalation).  4. Position the inhaler so that the top of the canister faces up.  5. Put your index finger on the top of the medicine canister. Your thumb supports the bottom of the inhaler.  6. Open your mouth.  7. Either place the inhaler between your teeth and place your lips tightly around the mouthpiece, or hold the inhaler 1-2 inches away from your open mouth. If you are unsure of which technique to use, ask your health care provider.  8. Breathe out (exhale) normally and as completely as possible.  9. Press the canister down with the index finger to release the medicine.  10. At the same time as the canister is pressed, inhale deeply and slowly until your lungs are completely filled.  This should take 4-6 seconds. Keep your tongue down.  11. Hold the medicine in your lungs for 5-10 seconds (10 seconds is best). This helps the medicine get into the small airways of your lungs.  12. Breathe out slowly, through pursed lips. Whistling is an example of pursed lips.  13. Wait at least 1 minute between puffs. Continue with the above steps until you have taken the number of puffs your health care provider has ordered. Do not use the inhaler more than your health care provider directs you to.  14. Replace the cap on the inhaler.  15. Follow the directions from your health care provider or the inhaler insert for cleaning the inhaler.  If you are using a steroid inhaler, after your last puff, rinse your mouth with water, gargle, and spit out the water. Do not swallow the water.  AVOID:  · Inhaling before or after starting the spray of medicine. It takes practice to coordinate your breathing with triggering the spray.  · Inhaling through the nose (rather than the mouth) when triggering the spray.  HOW TO DETERMINE IF YOUR INHALER IS FULL OR NEARLY EMPTY  You cannot know when an inhaler is empty by shaking it. Some inhalers are now being made with dose counters. Ask your health care provider for a prescription that has a dose counter if you feel you need that extra help. If your inhaler does not have a counter, ask your health care   provider to help you determine the date you need to refill your inhaler. Write the refill date on a calendar or your inhaler canister. Refill your inhaler 7-10 days before it runs out. Be sure to keep an adequate supply of medicine. This includes making sure it has not expired, and making sure you have a spare inhaler.  SEEK MEDICAL CARE IF:  · Symptoms are only partially relieved with your inhaler.  · You are having trouble using your inhaler.  · You experience an increase in phlegm.  SEEK IMMEDIATE MEDICAL CARE IF:  · You feel little or no relief with your inhalers. You are still  wheezing and feeling shortness of breath, tightness in your chest, or both.  · You have dizziness, headaches, or a fast heart rate.  · You have chills, fever, or night sweats.  · There is a noticeable increase in phlegm production, or there is blood in the phlegm.  MAKE SURE YOU:  · Understand these instructions.  · Will watch your condition.  · Will get help right away if you are not doing well or get worse.  Document Released: 05/23/2007 Document Revised: 12/10/2013 Document Reviewed: 01/11/2013  ExitCare® Patient Information ©2015 [employer]. This information is not intended to replace advice given to you by your health care provider. Make sure you discuss any questions you have with your health care provider.

## 2014-06-13 NOTE — Progress Notes (Signed)
[facility] [address], [place] [address] [phone]  Date:  06/13/2014   Name:  Meghan GardenerLinda M Hickman   DOB:  09-01-1950   MRN:  [id number]  PCP:  Lucilla EdinAUB, STEVE A, MD    Chief Complaint: Cough; Nasal Congestion; and Fever   History of Present Illness:  Meghan GardenerLinda M Hickman is a 63 y.o. very pleasant female patient who presents with the following:  Ill for this week with nasal congestion and purulent nasal drainage.  Some post nasal drainage that is foul tasting.   Sore throat. Now has cough productive purulent sputum and wheezing. No shortness of breath No fever or chills.  No nausea or vomiting.  No stool change No rash. No improvement with over the counter medications or other home remedies.  Denies other complaint or health concern today.    Patient Active Problem List   Diagnosis Date Noted  . Heart murmur 02/11/2013  . Type II or unspecified type diabetes mellitus without mention of complication, not stated as uncontrolled 02/09/2013  . Other and unspecified hyperlipidemia 02/09/2013    Past Medical History  Diagnosis Date  . Arthritis   . Diabetes mellitus without complication   . Hyperlipidemia   . Heart murmur   . Hypertension   . Wears glasses     Past Surgical History  Procedure Laterality Date  . Shoulder arthroscopy with subacromial decompression and bicep tendon repair Right 04/23/2013    Procedure: SHOULDER ARTHROSCOPY WITH SUBACROMIAL DECOMPRESSION AND BICEP TENDON TENOTOMY;  Surgeon: Mable ParisJustin William Chandler, MD;  Location: [facility];  Service: Orthopedics;  Laterality: Right;    History  Substance Use Topics  . Smoking status: Former Smoker -- 0.20 packs/day for 10 years    Types: Cigarettes    Start date: 04/30/2012    Quit date: 08/16/2012  . Smokeless tobacco: Not on file  . Alcohol Use: No    History reviewed. No pertinent family history.  No Known Allergies  Medication list has been reviewed and  updated.  Current Outpatient Prescriptions on File Prior to Visit  Medication Sig Dispense Refill  . aspirin 81 MG tablet Take 81 mg by mouth daily.    [name] Kitchen. atorvastatin (LIPITOR) 40 MG tablet Take 1 tablet (40 mg total) by mouth daily. 90 tablet 3  . lansoprazole (PREVACID) 30 MG capsule Take 1 capsule (30 mg total) by mouth daily. 90 capsule 3  . losartan-hydrochlorothiazide (HYZAAR) 50-12.5 MG per tablet Take 1 tablet by mouth daily. 90 tablet 3  . metFORMIN (GLUCOPHAGE) 500 MG tablet Take 1 tablet (500 mg total) by mouth daily. 90 tablet 3  . temazepam (RESTORIL) 30 MG capsule Take 1 capsule (30 mg total) by mouth at bedtime as needed for sleep. 30 capsule 5  . traMADol (ULTRAM) 50 MG tablet Take 1 tablet (50 mg total) by mouth every 6 (six) hours as needed. 30 tablet 0  . venlafaxine (EFFEXOR) 50 MG tablet Take 1 tablet (50 mg total) by mouth daily. PATIENT NEEDS OFFICE VISIT FOR ADDITIONAL REFILLS 30 tablet 0  . zolpidem (AMBIEN) 5 MG tablet Take 1 tablet (5 mg total) by mouth at bedtime as needed for sleep. Take one/half to one tab at bedtime as needed 30 tablet 5  . sucralfate (CARAFATE) 1 G tablet 1 tab 1 h ac and hs 120 tablet 0   No current facility-administered medications on file prior to visit.    Review of Systems:  As per HPI, otherwise negative.  Physical Examination: Filed Vitals:   06/13/14 2027  BP: 132/72  Pulse: 97  Temp: 98.4 F (36.9 C)  Resp: 18   Filed Vitals:   06/13/14 2027  Height: 4' 11.5" (1.511 m)  Weight: 151 lb (68.493 kg)   Body mass index is 30 kg/(m^2). Ideal Body Weight: Weight in (lb) to have BMI = 25: 125.6  GEN: WDWN, NAD, Non-toxic, A & O x 3 HEENT: Atraumatic, Normocephalic. Neck supple. No masses, No LAD. Ears and Nose: No external deformity. CV: RRR, No M/G/R. No JVD. No thrill. No extra heart sounds. PULM: diffuse harsh wheezes, no crackles, rhonchi. No retractions. No resp. distress. No accessory muscle use. ABD: S, NT, ND,  +BS. No rebound. No HSM. EXTR: No c/c/e NEURO Normal gait.  PSYCH: Normally interactive. Conversant. Not depressed or anxious appearing.  Calm demeanor.    Assessment and Plan: Bronchitis with bronchospasm Sinusitis Neb Albuterol MDI augmentin mucinex d Phen c cod  Signed,  Phillips OdorJeffery Paxon Propes, MD

## 2014-09-24 ENCOUNTER — Other Ambulatory Visit: Payer: Self-pay | Admitting: Emergency Medicine

## 2014-09-26 NOTE — Telephone Encounter (Signed)
Called in.

## 2014-10-29 ENCOUNTER — Other Ambulatory Visit: Payer: Self-pay | Admitting: General Surgery

## 2014-11-26 NOTE — Pre-Procedure Instructions (Addendum)
Doralee AlbinoLinda M Warriner  11/26/2014   Your procedure is scheduled on:  Thursday, April 28.  [facility] 7:00 AM.   Call this number if you have problems the morning of surgery: [phone]               For any other questions, please call [phone], Monday - Friday 8 AM - 4 PM.   Remember:   Do not eat food or drink liquids after midnight Wednesday, April 27.   Take these medicines the morning of surgery with A SIP OF WATER:   venlafaxine [facility](EFFEXOR).                  Take if needed:      May use Albuterol inhale and bring it to the hospital with you.                     Stop taking Aspirin, Coumadin, Plavix, Effient and Herbal medications :pseudoephedrine-guaifenesin (MUCINEX D).   Do not take any NSAIDs ie: Ibuprofen,  Advil,Naproxen or any medication containing Aspirin.                                                                                                                               Do not wear jewelry, make-up or nail polish.  Do not wear lotions, powders, or perfumes.   Do not shave 48 hours prior to surgery.   Do not bring valuables to the hospital.             [facility] is not responsible for any belongings or valuables.               Contacts, dentures or bridgework may not be worn into surgery.  Leave suitcase in the car. After surgery it may be brought to your room.  For patients admitted to the hospital, discharge time is determined by your  treatment team.               Patients discharged the day of surgery will not be allowed to drive home.  Name and phone number of your driver: -   Special Instructions: Review  [facility] - Preparing For Surgery.   Please read over the following fact sheets that you were given: Pain Booklet, Coughing and Deep Breathing and Surgical Site Infection Prevention

## 2014-11-27 ENCOUNTER — Encounter (HOSPITAL_COMMUNITY): Payer: Self-pay

## 2014-11-27 ENCOUNTER — Other Ambulatory Visit: Payer: Self-pay

## 2014-11-27 ENCOUNTER — Encounter (HOSPITAL_COMMUNITY)
Admission: RE | Admit: 2014-11-27 | Discharge: 2014-11-27 | Disposition: A | Discharge status: Home / Self Care | Payer: Federal, State, Local not specified - PPO | Source: Ambulatory Visit | Attending: General Surgery | Admitting: General Surgery

## 2014-11-27 DIAGNOSIS — Z01812 Encounter for preprocedural laboratory examination: Secondary | ICD-10-CM | POA: Insufficient documentation

## 2014-11-27 DIAGNOSIS — F329 Major depressive disorder, single episode, unspecified: Secondary | ICD-10-CM | POA: Diagnosis not present

## 2014-11-27 DIAGNOSIS — G4730 Sleep apnea, unspecified: Secondary | ICD-10-CM | POA: Diagnosis not present

## 2014-11-27 DIAGNOSIS — K5790 Diverticulosis of intestine, part unspecified, without perforation or abscess without bleeding: Secondary | ICD-10-CM | POA: Diagnosis not present

## 2014-11-27 DIAGNOSIS — E119 Type 2 diabetes mellitus without complications: Secondary | ICD-10-CM | POA: Diagnosis not present

## 2014-11-27 DIAGNOSIS — Z01810 Encounter for preprocedural cardiovascular examination: Secondary | ICD-10-CM | POA: Diagnosis not present

## 2014-11-27 DIAGNOSIS — G43909 Migraine, unspecified, not intractable, without status migrainosus: Secondary | ICD-10-CM | POA: Diagnosis not present

## 2014-11-27 DIAGNOSIS — Z87891 Personal history of nicotine dependence: Secondary | ICD-10-CM | POA: Diagnosis not present

## 2014-11-27 DIAGNOSIS — E780 Pure hypercholesterolemia: Secondary | ICD-10-CM | POA: Diagnosis not present

## 2014-11-27 DIAGNOSIS — M1990 Unspecified osteoarthritis, unspecified site: Secondary | ICD-10-CM | POA: Diagnosis not present

## 2014-11-27 DIAGNOSIS — K8020 Calculus of gallbladder without cholecystitis without obstruction: Secondary | ICD-10-CM | POA: Diagnosis not present

## 2014-11-27 DIAGNOSIS — Z79899 Other long term (current) drug therapy: Secondary | ICD-10-CM | POA: Diagnosis not present

## 2014-11-27 DIAGNOSIS — Z7982 Long term (current) use of aspirin: Secondary | ICD-10-CM | POA: Diagnosis not present

## 2014-11-27 HISTORY — DX: Gastro-esophageal reflux disease without esophagitis: K21.9

## 2014-11-27 HISTORY — DX: Headache: R51

## 2014-11-27 HISTORY — DX: Headache, unspecified: R51.9

## 2014-11-27 LAB — CBC WITH DIFFERENTIAL/PLATELET
Basophils Absolute: 0 10*3/uL (ref 0.0–0.1)
Basophils Relative: 0 % (ref 0–1)
Eosinophils Absolute: 0.2 10*3/uL (ref 0.0–0.7)
Eosinophils Relative: 2 % (ref 0–5)
HCT: 39.7 % (ref 36.0–46.0)
Hemoglobin: 12.7 g/dL (ref 12.0–15.0)
LYMPHS ABS: 2.9 10*3/uL (ref 0.7–4.0)
LYMPHS PCT: 29 % (ref 12–46)
MCH: 27.1 pg (ref 26.0–34.0)
MCHC: 32 g/dL (ref 30.0–36.0)
MCV: 84.8 fL (ref 78.0–100.0)
Monocytes Absolute: 0.6 10*3/uL (ref 0.1–1.0)
Monocytes Relative: 6 % (ref 3–12)
Neutro Abs: 6.3 10*3/uL (ref 1.7–7.7)
Neutrophils Relative %: 63 % (ref 43–77)
Platelets: 338 10*3/uL (ref 150–400)
RBC: 4.68 MIL/uL (ref 3.87–5.11)
RDW: 13.9 % (ref 11.5–15.5)
WBC: 10 10*3/uL (ref 4.0–10.5)

## 2014-11-27 LAB — COMPREHENSIVE METABOLIC PANEL
ALT: 18 U/L (ref 0–35)
AST: 22 U/L (ref 0–37)
Albumin: 4 g/dL (ref 3.5–5.2)
Alkaline Phosphatase: 87 U/L (ref 39–117)
Anion gap: 9 (ref 5–15)
BUN: 15 mg/dL (ref 6–23)
CHLORIDE: 104 mmol/L (ref 96–112)
CO2: 26 mmol/L (ref 19–32)
Calcium: 9.6 mg/dL (ref 8.4–10.5)
Creatinine, Ser: 1.02 mg/dL (ref 0.50–1.10)
GFR calc Af Amer: 66 mL/min — ABNORMAL LOW (ref >90)
GFR calc non Af Amer: 57 mL/min — ABNORMAL LOW (ref >90)
Glucose, Bld: 171 mg/dL — ABNORMAL HIGH (ref 70–99)
Potassium: 3.2 mmol/L — ABNORMAL LOW (ref 3.5–5.1)
SODIUM: 139 mmol/L (ref 135–145)
Total Bilirubin: 0.7 mg/dL (ref 0.3–1.2)
Total Protein: 6.8 g/dL (ref 6.0–8.3)

## 2014-12-04 MED ORDER — DEXTROSE 5 % IV SOLN
2.0000 g | INTRAVENOUS | Status: AC
  Administered 2014-12-05: 2 g via INTRAVENOUS
  Filled 2014-12-04: qty 2

## 2014-12-05 ENCOUNTER — Encounter (HOSPITAL_COMMUNITY): Payer: Self-pay | Admitting: *Deleted

## 2014-12-05 ENCOUNTER — Encounter (HOSPITAL_COMMUNITY)
Admission: RE | Disposition: A | Discharge status: Home / Self Care | Payer: Self-pay | Source: Ambulatory Visit | Attending: General Surgery

## 2014-12-05 ENCOUNTER — Ambulatory Visit (HOSPITAL_COMMUNITY): Payer: Federal, State, Local not specified - PPO | Admitting: Certified Registered Nurse Anesthetist

## 2014-12-05 ENCOUNTER — Observation Stay (HOSPITAL_COMMUNITY)
Admission: RE | Admit: 2014-12-05 | Discharge: 2014-12-06 | Disposition: A | Discharge status: Home / Self Care | Payer: Federal, State, Local not specified - PPO | Source: Ambulatory Visit | Attending: General Surgery | Admitting: General Surgery

## 2014-12-05 DIAGNOSIS — Z7982 Long term (current) use of aspirin: Secondary | ICD-10-CM | POA: Insufficient documentation

## 2014-12-05 DIAGNOSIS — G43909 Migraine, unspecified, not intractable, without status migrainosus: Secondary | ICD-10-CM | POA: Insufficient documentation

## 2014-12-05 DIAGNOSIS — K8020 Calculus of gallbladder without cholecystitis without obstruction: Secondary | ICD-10-CM | POA: Diagnosis not present

## 2014-12-05 DIAGNOSIS — Z9049 Acquired absence of other specified parts of digestive tract: Secondary | ICD-10-CM

## 2014-12-05 DIAGNOSIS — F329 Major depressive disorder, single episode, unspecified: Secondary | ICD-10-CM | POA: Insufficient documentation

## 2014-12-05 DIAGNOSIS — E780 Pure hypercholesterolemia: Secondary | ICD-10-CM | POA: Insufficient documentation

## 2014-12-05 DIAGNOSIS — Z87891 Personal history of nicotine dependence: Secondary | ICD-10-CM | POA: Insufficient documentation

## 2014-12-05 DIAGNOSIS — K5790 Diverticulosis of intestine, part unspecified, without perforation or abscess without bleeding: Secondary | ICD-10-CM | POA: Insufficient documentation

## 2014-12-05 DIAGNOSIS — M1990 Unspecified osteoarthritis, unspecified site: Secondary | ICD-10-CM | POA: Insufficient documentation

## 2014-12-05 DIAGNOSIS — Z79899 Other long term (current) drug therapy: Secondary | ICD-10-CM | POA: Insufficient documentation

## 2014-12-05 DIAGNOSIS — G4730 Sleep apnea, unspecified: Secondary | ICD-10-CM | POA: Insufficient documentation

## 2014-12-05 DIAGNOSIS — E119 Type 2 diabetes mellitus without complications: Secondary | ICD-10-CM | POA: Insufficient documentation

## 2014-12-05 HISTORY — PX: CHOLECYSTECTOMY: SHX55

## 2014-12-05 LAB — GLUCOSE, CAPILLARY: Glucose-Capillary: 107 mg/dL — ABNORMAL HIGH (ref 70–99)

## 2014-12-05 SURGERY — LAPAROSCOPIC CHOLECYSTECTOMY
Anesthesia: General | Site: Abdomen

## 2014-12-05 MED ORDER — 0.9 % SODIUM CHLORIDE (POUR BTL) OPTIME
TOPICAL | Status: DC | PRN
  Administered 2014-12-05: 1000 mL

## 2014-12-05 MED ORDER — HYDROMORPHONE HCL 1 MG/ML IJ SOLN
INTRAMUSCULAR | Status: AC
  Filled 2014-12-05: qty 1

## 2014-12-05 MED ORDER — LOSARTAN POTASSIUM 50 MG PO TABS
50.0000 mg | ORAL_TABLET | Freq: Every day | ORAL | Status: DC
  Administered 2014-12-06: 50 mg via ORAL
  Filled 2014-12-05: qty 1

## 2014-12-05 MED ORDER — BUPIVACAINE-EPINEPHRINE (PF) 0.25% -1:200000 IJ SOLN
INTRAMUSCULAR | Status: AC
  Filled 2014-12-05: qty 30

## 2014-12-05 MED ORDER — NEOSTIGMINE METHYLSULFATE 10 MG/10ML IV SOLN
INTRAVENOUS | Status: DC | PRN
  Administered 2014-12-05: 4 mg via INTRAVENOUS

## 2014-12-05 MED ORDER — GLYCOPYRROLATE 0.2 MG/ML IJ SOLN
INTRAMUSCULAR | Status: AC
  Filled 2014-12-05: qty 2

## 2014-12-05 MED ORDER — SODIUM CHLORIDE 0.9 % IJ SOLN: 3.0000 mL | INTRAMUSCULAR | Status: DC | PRN

## 2014-12-05 MED ORDER — SODIUM CHLORIDE 0.9 % IR SOLN
Status: DC | PRN
  Administered 2014-12-05: 1000 mL

## 2014-12-05 MED ORDER — NEOSTIGMINE METHYLSULFATE 10 MG/10ML IV SOLN
INTRAVENOUS | Status: AC
  Filled 2014-12-05: qty 3

## 2014-12-05 MED ORDER — LOSARTAN POTASSIUM-HCTZ 50-12.5 MG PO TABS: 1.0000 | ORAL_TABLET | Freq: Every day | ORAL | Status: DC

## 2014-12-05 MED ORDER — HYDROMORPHONE HCL 1 MG/ML IJ SOLN: 0.2500 mg | INTRAMUSCULAR | Status: DC | PRN

## 2014-12-05 MED ORDER — PHENYLEPHRINE HCL 10 MG/ML IJ SOLN
INTRAMUSCULAR | Status: DC | PRN
  Administered 2014-12-05: 80 ug via INTRAVENOUS
  Administered 2014-12-05: 120 ug via INTRAVENOUS
  Administered 2014-12-05: 80 ug via INTRAVENOUS
  Administered 2014-12-05: 120 ug via INTRAVENOUS
  Administered 2014-12-05: 40 ug via INTRAVENOUS
  Administered 2014-12-05: 80 ug via INTRAVENOUS

## 2014-12-05 MED ORDER — HYDROMORPHONE HCL 1 MG/ML IJ SOLN
0.2500 mg | INTRAMUSCULAR | Status: DC | PRN
  Administered 2014-12-05 (×2): 1 mg via INTRAVENOUS

## 2014-12-05 MED ORDER — ALBUTEROL SULFATE (2.5 MG/3ML) 0.083% IN NEBU: 2.5000 mg | INHALATION_SOLUTION | RESPIRATORY_TRACT | Status: DC | PRN

## 2014-12-05 MED ORDER — HEPARIN SODIUM (PORCINE) 5000 UNIT/ML IJ SOLN
5000.0000 [IU] | Freq: Three times a day (TID) | INTRAMUSCULAR | Status: DC
Start: 2014-12-05 — End: 2014-12-06
  Administered 2014-12-05 – 2014-12-06 (×2): 5000 [IU] via SUBCUTANEOUS
  Filled 2014-12-05 (×4): qty 1

## 2014-12-05 MED ORDER — MIDAZOLAM HCL 2 MG/2ML IJ SOLN: 0.5000 mg | Freq: Once | INTRAMUSCULAR | Status: DC | PRN

## 2014-12-05 MED ORDER — PROMETHAZINE HCL 25 MG/ML IJ SOLN
6.2500 mg | INTRAMUSCULAR | Status: DC | PRN
  Administered 2014-12-05: 12.5 mg via INTRAVENOUS

## 2014-12-05 MED ORDER — PHENYLEPHRINE 40 MCG/ML (10ML) SYRINGE FOR IV PUSH (FOR BLOOD PRESSURE SUPPORT)
PREFILLED_SYRINGE | INTRAVENOUS | Status: AC
  Filled 2014-12-05: qty 20

## 2014-12-05 MED ORDER — ACETAMINOPHEN 325 MG PO TABS: 650.0000 mg | ORAL_TABLET | Freq: Four times a day (QID) | ORAL | Status: DC | PRN

## 2014-12-05 MED ORDER — LACTATED RINGERS IV SOLN
INTRAVENOUS | Status: DC
  Administered 2014-12-05 (×2): via INTRAVENOUS

## 2014-12-05 MED ORDER — ONDANSETRON HCL 4 MG/2ML IJ SOLN
INTRAMUSCULAR | Status: DC | PRN
  Administered 2014-12-05: 4 mg via INTRAVENOUS

## 2014-12-05 MED ORDER — GLYCOPYRROLATE 0.2 MG/ML IJ SOLN
INTRAMUSCULAR | Status: AC
  Filled 2014-12-05: qty 3

## 2014-12-05 MED ORDER — FENTANYL CITRATE (PF) 100 MCG/2ML IJ SOLN
INTRAMUSCULAR | Status: DC | PRN
  Administered 2014-12-05 (×5): 50 ug via INTRAVENOUS

## 2014-12-05 MED ORDER — TRAMADOL HCL 50 MG PO TABS: 50.0000 mg | ORAL_TABLET | Freq: Four times a day (QID) | ORAL | Status: DC | PRN

## 2014-12-05 MED ORDER — SUMATRIPTAN SUCCINATE 100 MG PO TABS
100.0000 mg | ORAL_TABLET | ORAL | Status: DC | PRN
  Filled 2014-12-05: qty 1

## 2014-12-05 MED ORDER — LIDOCAINE HCL 4 % MT SOLN
OROMUCOSAL | Status: DC | PRN
  Administered 2014-12-05: 4 mL via TOPICAL

## 2014-12-05 MED ORDER — ACETAMINOPHEN 650 MG RE SUPP: 650.0000 mg | Freq: Four times a day (QID) | RECTAL | Status: DC | PRN

## 2014-12-05 MED ORDER — LIDOCAINE HCL (CARDIAC) 20 MG/ML IV SOLN
INTRAVENOUS | Status: AC
  Filled 2014-12-05: qty 5

## 2014-12-05 MED ORDER — PROPOFOL 10 MG/ML IV BOLUS
INTRAVENOUS | Status: DC | PRN
  Administered 2014-12-05: 160 mg via INTRAVENOUS

## 2014-12-05 MED ORDER — ACETAMINOPHEN 650 MG RE SUPP: 650.0000 mg | RECTAL | Status: DC | PRN

## 2014-12-05 MED ORDER — ZOLPIDEM TARTRATE 5 MG PO TABS: 2.5000 mg | ORAL_TABLET | Freq: Every evening | ORAL | Status: DC | PRN

## 2014-12-05 MED ORDER — DEXTROSE 5 % IV SOLN
10.0000 mg | INTRAVENOUS | Status: DC | PRN
  Administered 2014-12-05: 20 ug/min via INTRAVENOUS

## 2014-12-05 MED ORDER — MEPERIDINE HCL 25 MG/ML IJ SOLN: 6.2500 mg | INTRAMUSCULAR | Status: DC | PRN

## 2014-12-05 MED ORDER — GLYCOPYRROLATE 0.2 MG/ML IJ SOLN
INTRAMUSCULAR | Status: DC | PRN
  Administered 2014-12-05: .2 mg via INTRAVENOUS
  Administered 2014-12-05: 0.6 mg via INTRAVENOUS

## 2014-12-05 MED ORDER — ONDANSETRON HCL 4 MG/2ML IJ SOLN: 4.0000 mg | Freq: Four times a day (QID) | INTRAMUSCULAR | Status: DC | PRN

## 2014-12-05 MED ORDER — MORPHINE SULFATE 2 MG/ML IJ SOLN
INTRAMUSCULAR | Status: AC
  Filled 2014-12-05: qty 1

## 2014-12-05 MED ORDER — PROMETHAZINE HCL 25 MG/ML IJ SOLN
INTRAMUSCULAR | Status: AC
  Filled 2014-12-05: qty 1

## 2014-12-05 MED ORDER — EPHEDRINE SULFATE 50 MG/ML IJ SOLN
INTRAMUSCULAR | Status: DC | PRN
  Administered 2014-12-05: 10 mg via INTRAVENOUS
  Administered 2014-12-05 (×2): 5 mg via INTRAVENOUS

## 2014-12-05 MED ORDER — ACETAMINOPHEN 325 MG PO TABS: 650.0000 mg | ORAL_TABLET | ORAL | Status: DC | PRN

## 2014-12-05 MED ORDER — OXYCODONE HCL 5 MG PO TABS
5.0000 mg | ORAL_TABLET | ORAL | Status: DC | PRN
  Administered 2014-12-05 – 2014-12-06 (×2): 10 mg via ORAL
  Filled 2014-12-05: qty 2

## 2014-12-05 MED ORDER — OXYCODONE HCL 5 MG PO TABS
ORAL_TABLET | ORAL | Status: AC
  Filled 2014-12-05: qty 2

## 2014-12-05 MED ORDER — FENTANYL CITRATE (PF) 250 MCG/5ML IJ SOLN
INTRAMUSCULAR | Status: AC
  Filled 2014-12-05: qty 5

## 2014-12-05 MED ORDER — PROMETHAZINE HCL 25 MG/ML IJ SOLN: 6.2500 mg | INTRAMUSCULAR | Status: DC | PRN

## 2014-12-05 MED ORDER — SODIUM CHLORIDE 0.9 % IV SOLN: INTRAVENOUS | Status: DC

## 2014-12-05 MED ORDER — BUPIVACAINE-EPINEPHRINE 0.25% -1:200000 IJ SOLN
INTRAMUSCULAR | Status: DC | PRN
  Administered 2014-12-05: 17 mL

## 2014-12-05 MED ORDER — LIDOCAINE HCL (CARDIAC) 20 MG/ML IV SOLN
INTRAVENOUS | Status: DC | PRN
  Administered 2014-12-05: 50 mg via INTRAVENOUS

## 2014-12-05 MED ORDER — HYDROCHLOROTHIAZIDE 12.5 MG PO CAPS
12.5000 mg | ORAL_CAPSULE | Freq: Every day | ORAL | Status: DC
  Administered 2014-12-06: 12.5 mg via ORAL
  Filled 2014-12-05: qty 1

## 2014-12-05 MED ORDER — MIDAZOLAM HCL 2 MG/2ML IJ SOLN
INTRAMUSCULAR | Status: AC
  Filled 2014-12-05: qty 2

## 2014-12-05 MED ORDER — PROPOFOL 10 MG/ML IV BOLUS
INTRAVENOUS | Status: AC
  Filled 2014-12-05: qty 20

## 2014-12-05 MED ORDER — MORPHINE SULFATE 2 MG/ML IJ SOLN
2.0000 mg | INTRAMUSCULAR | Status: DC | PRN
  Administered 2014-12-05 – 2014-12-06 (×3): 2 mg via INTRAVENOUS
  Filled 2014-12-05 (×2): qty 1

## 2014-12-05 MED ORDER — PANTOPRAZOLE SODIUM 40 MG PO TBEC
40.0000 mg | DELAYED_RELEASE_TABLET | Freq: Every day | ORAL | Status: DC
  Administered 2014-12-06: 40 mg via ORAL
  Filled 2014-12-05 (×2): qty 1

## 2014-12-05 MED ORDER — SODIUM CHLORIDE 0.9 % IJ SOLN: 3.0000 mL | Freq: Two times a day (BID) | INTRAMUSCULAR | Status: DC

## 2014-12-05 MED ORDER — ROCURONIUM BROMIDE 100 MG/10ML IV SOLN
INTRAVENOUS | Status: DC | PRN
  Administered 2014-12-05: 30 mg via INTRAVENOUS

## 2014-12-05 MED ORDER — MIDAZOLAM HCL 5 MG/5ML IJ SOLN
INTRAMUSCULAR | Status: DC | PRN
  Administered 2014-12-05 (×2): 1 mg via INTRAVENOUS

## 2014-12-05 MED ORDER — SODIUM CHLORIDE 0.9 % IV SOLN: 250.0000 mL | INTRAVENOUS | Status: DC | PRN

## 2014-12-05 MED ORDER — DEXAMETHASONE SODIUM PHOSPHATE 4 MG/ML IJ SOLN
INTRAMUSCULAR | Status: DC | PRN
  Administered 2014-12-05: 4 mg via INTRAVENOUS

## 2014-12-05 MED ORDER — ONDANSETRON HCL 4 MG/2ML IJ SOLN
INTRAMUSCULAR | Status: AC
  Filled 2014-12-05: qty 2

## 2014-12-05 SURGICAL SUPPLY — 37 items
APPLIER CLIP 5 13 M/L LIGAMAX5 (MISCELLANEOUS) ×2
BLADE SURG ROTATE 9660 (MISCELLANEOUS) IMPLANT
CANISTER SUCTION 2500CC (MISCELLANEOUS) ×2 IMPLANT
CHLORAPREP W/TINT 26ML (MISCELLANEOUS) ×2 IMPLANT
CLIP APPLIE 5 13 M/L LIGAMAX5 (MISCELLANEOUS) ×1 IMPLANT
CLSR STERI-STRIP ANTIMIC 1/2X4 (GAUZE/BANDAGES/DRESSINGS) ×2 IMPLANT
COVER SURGICAL LIGHT HANDLE (MISCELLANEOUS) ×2 IMPLANT
DEVICE TROCAR PUNCTURE CLOSURE (ENDOMECHANICALS) ×2 IMPLANT
DRAPE LAPAROSCOPIC ABDOMINAL (DRAPES) ×2 IMPLANT
ELECT REM PT RETURN 9FT ADLT (ELECTROSURGICAL) ×2
ELECTRODE REM PT RTRN 9FT ADLT (ELECTROSURGICAL) ×1 IMPLANT
GLOVE BIO SURGEON STRL SZ7 (GLOVE) ×2 IMPLANT
GLOVE BIOGEL PI IND STRL 7.5 (GLOVE) ×1 IMPLANT
GLOVE BIOGEL PI INDICATOR 7.5 (GLOVE) ×1
GOWN STRL REUS W/ TWL LRG LVL3 (GOWN DISPOSABLE) ×3 IMPLANT
GOWN STRL REUS W/TWL LRG LVL3 (GOWN DISPOSABLE) ×3
KIT BASIN OR (CUSTOM PROCEDURE TRAY) ×2 IMPLANT
KIT ROOM TURNOVER OR (KITS) ×2 IMPLANT
LIQUID BAND (GAUZE/BANDAGES/DRESSINGS) ×2 IMPLANT
NS IRRIG 1000ML POUR BTL (IV SOLUTION) ×2 IMPLANT
PAD ARMBOARD 7.5X6 YLW CONV (MISCELLANEOUS) ×2 IMPLANT
POUCH RETRIEVAL ECOSAC 10 (ENDOMECHANICALS) ×1 IMPLANT
POUCH RETRIEVAL ECOSAC 10MM (ENDOMECHANICALS) ×1
SCISSORS LAP 5X35 DISP (ENDOMECHANICALS) ×2 IMPLANT
SET IRRIG TUBING LAPAROSCOPIC (IRRIGATION / IRRIGATOR) ×2 IMPLANT
SLEEVE ENDOPATH XCEL 5M (ENDOMECHANICALS) ×4 IMPLANT
SPECIMEN JAR SMALL (MISCELLANEOUS) ×2 IMPLANT
STRIP CLOSURE SKIN 1/2X4 (GAUZE/BANDAGES/DRESSINGS) IMPLANT
SUT MNCRL AB 4-0 PS2 18 (SUTURE) ×2 IMPLANT
SUT VIC AB 0 CT1 27 (SUTURE) ×1
SUT VIC AB 0 CT1 27XBRD ANBCTR (SUTURE) ×1 IMPLANT
TOWEL OR 17X24 6PK STRL BLUE (TOWEL DISPOSABLE) ×2 IMPLANT
TOWEL OR 17X26 10 PK STRL BLUE (TOWEL DISPOSABLE) ×2 IMPLANT
TRAY LAPAROSCOPIC (CUSTOM PROCEDURE TRAY) ×2 IMPLANT
TROCAR XCEL BLUNT TIP 100MML (ENDOMECHANICALS) ×2 IMPLANT
TROCAR XCEL NON-BLD 5MMX100MML (ENDOMECHANICALS) ×2 IMPLANT
TUBING INSUFFLATION (TUBING) ×2 IMPLANT

## 2014-12-05 NOTE — Anesthesia Procedure Notes (Signed)
Procedure Name: Intubation Date/Time: 12/05/2014 9:43 AM Performed by: Daiva EvesAVENEL, Ledarrius Beauchaine W Pre-anesthesia Checklist: Patient identified, Timeout performed, Emergency Drugs available, Suction available and Patient being monitored Patient Re-evaluated:Patient Re-evaluated prior to inductionOxygen Delivery Method: Circle system utilized Preoxygenation: Pre-oxygenation with 100% oxygen Intubation Type: IV induction Ventilation: Mask ventilation without difficulty Laryngoscope Size: Mac and 3 Grade View: Grade II Tube type: Oral Tube size: 7.5 mm Number of attempts: 1 Airway Equipment and Method: Stylet Placement Confirmation: breath sounds checked- equal and bilateral,  positive ETCO2,  CO2 detector and ETT inserted through vocal cords under direct vision Tube secured with: Tape Dental Injury: Teeth and Oropharynx as per pre-operative assessment

## 2014-12-05 NOTE — Interval H&P Note (Signed)
History and Physical Interval Note:  12/05/2014 8:17 AM  Meghan GardenerLinda M Hickman  has presented today for surgery, with the diagnosis of BILIARY COLIC  The various methods of treatment have been discussed with the patient and family. After consideration of risks, benefits and other options for treatment, the patient has consented to  Procedure(s): LAPAROSCOPIC CHOLECYSTECTOMY (N/A) as a surgical intervention .  The patient's history has been reviewed, patient examined, no change in status, stable for surgery.  I have reviewed the patient's chart and labs.  Questions were answered to the patient's satisfaction.     Alle Difabio

## 2014-12-05 NOTE — Progress Notes (Signed)
Arrival Method: via wheelchair   Mental Status: alert and oriented x4 Skin: full assessment documented Tubes: n/a IV: R hand Pain: 9/10 Family: None present Living Situation: Home with family  Safety Measures: Bed alarm on, bed in lowest position, call bell in reach 6E Orientation: oriented to staff and unit  Will continue to monitor.

## 2014-12-05 NOTE — Op Note (Signed)
Preoperative diagnosis: symptomatic cholelithiasis Postoperative diagnosis: same as above Procedure: laparoscopic cholecystectomy Surgeon: Dr Harden MoMatt Lynnmarie Lovett Anesthesia: general EBL: minimal Drains none Specimen gb and contents to pathology Complications: none Sponge count correct at completion Disposition to recovery stable  Indications: This is a [age] yof with symptoms that appear to be related to her gallbladder and what appears to be sludge on ultrasound We  discussed proceeding with her laparoscopic cholecystectomy.  Procedure: After informed consent was obtained the patient was taken to the operating room. She was given antibiotics. Sequential compression devices were on her legs. She was placed under general anesthesia without complication. Her abdomen was prepped and draped in the standard sterile surgical fashion. A surgical timeout was then performed.  I infiltrated marcaine below the umbilicus and made a vertical incision. I entered into the peritoneum. I placed a 0 vicryl pursestring suture and inserted a hasson trocar.  I then was able to identify the cystic duct and clearly had the critical view of safety.I then clipped the cystic duct and divided it. The duct was viable and the clips traversed the duct. I then treated the artery in similar fashion as it was immediately adjacent to the duct. I then removed the gallbladder from the liver bed and placed it in a bag. It was then removed from the umbilical incision. I then obtained hemostasis and irrigated. . I then desufflated the abdomen and removed all my trocars. I did close the umbilical incision with  additional 0 vicryl sutures andthe endoclose device. I then close these with 4-0 Monocryl and Dermabond. She tolerated this well was extubated and transferred to the recovery room in stable condition.

## 2014-12-05 NOTE — Anesthesia Postprocedure Evaluation (Signed)
  Anesthesia Post-op Note  Patient: Meghan GardenerLinda M Wheat  Procedure(s) Performed: Procedure(s): LAPAROSCOPIC CHOLECYSTECTOMY (N/A)  Patient Location: PACU  Anesthesia Type:General  Level of Consciousness: awake, alert , oriented, patient cooperative and responds to stimulation  Airway and Oxygen Therapy: Patient Spontanous Breathing and Patient connected to nasal cannula oxygen  Post-op Pain: mild  Post-op Assessment: Post-op Vital signs reviewed, Patient's Cardiovascular Status Stable, Respiratory Function Stable, Patent Airway, No signs of Nausea or vomiting and Pain level controlled  Post-op Vital Signs: Reviewed and stable  Last Vitals:  Filed Vitals:   12/05/14 1039  BP: 117/60  Pulse: 96  Temp:   Resp: 11    Complications: No apparent anesthesia complications

## 2014-12-05 NOTE — Anesthesia Preprocedure Evaluation (Addendum)
Anesthesia Evaluation  Patient identified by MRN, date of birth, ID band Patient awake    Reviewed: Allergy & Precautions, NPO status , Patient's Chart, lab work & pertinent test results  History of Anesthesia Complications Negative for: history of anesthetic complications  Airway Mallampati: II  TM Distance: >3 FB Neck ROM: Full    Dental  (+) Partial Upper, Dental Advisory Given, Missing, Poor Dentition,    Pulmonary COPD COPD inhaler, former smoker (quit 2014),  breath sounds clear to auscultation        Cardiovascular hypertension, - anginaRhythm:Regular Rate:Normal  '14 ECHO: EF 69%, grade 1 diastolic dysfunction, trace MR, mild TR   Neuro/Psych negative neurological ROS     GI/Hepatic Neg liver ROS, GERD-  Medicated and Controlled,  Endo/Other  diabetes (glu 107), Oral Hypoglycemic Agents  Renal/GU negative Renal ROS     Musculoskeletal  (+) Arthritis -, Osteoarthritis,    Abdominal   Peds  Hematology negative hematology ROS (+)   Anesthesia Other Findings   Reproductive/Obstetrics                          Anesthesia Physical Anesthesia Plan  ASA: II  Anesthesia Plan: General   Post-op Pain Management:    Induction: Intravenous  Airway Management Planned: Oral ETT  Additional Equipment:   Intra-op Plan:   Post-operative Plan: Extubation in OR  Informed Consent: I have reviewed the patients History and Physical, chart, labs and discussed the procedure including the risks, benefits and alternatives for the proposed anesthesia with the patient or authorized representative who has indicated his/her understanding and acceptance.   Dental advisory given  Plan Discussed with: CRNA and Surgeon  Anesthesia Plan Comments: (Plan routine monitors, GETA)        Anesthesia Quick Evaluation

## 2014-12-05 NOTE — Transfer of Care (Signed)
Immediate Anesthesia Transfer of Care Note  Patient: Meghan GardenerLinda M Hickman  Procedure(s) Performed: Procedure(s): LAPAROSCOPIC CHOLECYSTECTOMY (N/A)  Patient Location: PACU  Anesthesia Type:General  Level of Consciousness: awake, alert  and oriented  Airway & Oxygen Therapy: Patient Spontanous Breathing and Patient connected to nasal cannula oxygen  Post-op Assessment: Report given to RN and Post -op Vital signs reviewed and stable  Post vital signs: Reviewed and stable  Last Vitals:  Filed Vitals:   12/05/14 0731  BP: 124/59  Pulse: 99  Temp: 36.7 C  Resp: 18    Complications: No apparent anesthesia complications

## 2014-12-05 NOTE — Discharge Instructions (Signed)
[facility] LAPAROSCOPIC SURGERY: POST OP INSTRUCTIONS  Always review your discharge instruction sheet given to you by the facility where your surgery was performed. IF YOU HAVE DISABILITY OR FAMILY LEAVE FORMS, YOU MUST BRING THEM TO THE OFFICE FOR PROCESSING.   DO NOT GIVE THEM TO YOUR DOCTOR.  1. A prescription for pain medication may be given to you upon discharge.  Take your pain medication as prescribed, if needed.  If narcotic pain medicine is not needed, then you may take acetaminophen (Tylenol), naprosyn (Alleve), or ibuprofen (Advil) as needed. 2. Take your usually prescribed medications unless otherwise directed. 3. If you need a refill on your pain medication, please contact your pharmacy.  They will contact our office to request authorization. Prescriptions will not be filled after 5pm or on week-ends. 4. You should follow a light diet the first few days after arrival home, such as soup and crackers, etc.  Be sure to include lots of fluids daily. 5. Most patients will experience some swelling and bruising in the area of the incisions.  Ice packs will help.  Swelling and bruising can take several days to resolve.  6. It is common to experience some constipation if taking pain medication after surgery.  Increasing fluid intake and taking a stool softener (such as Colace) will usually help or prevent this problem from occurring.  A mild laxative (Milk of Magnesia or Miralax) should be taken according to package instructions if there are no bowel movements after 48 hours. 7. Unless discharge instructions indicate otherwise, you may remove your bandages 48 hours after surgery, and you may shower at that time.  You may have steri-strips (small skin tapes) in place directly over the incision.  These strips should be left on the skin for 7-10 days.  If your surgeon used skin glue on the incision, you may shower in 24 hours.  The glue will flake  off over the next 2-3 weeks.  Any sutures or staples will be removed at the office during your follow-up visit. 8. ACTIVITIES:  You may resume regular (light) daily activities beginning the next day--such as daily self-care, walking, climbing stairs--gradually increasing activities as tolerated.  You may have sexual intercourse when it is comfortable.  Refrain from any heavy lifting or straining until approved by your doctor. a. You may drive when you are no longer taking prescription pain medication, you can comfortably wear a seatbelt, and you can safely maneuver your car and apply brakes. b. RETURN TO WORK:  __________________________________________________________ 9. You should see your doctor in the office for a follow-up appointment approximately 2-3 weeks after your surgery.  Make sure that you call for this appointment within a day or two after you arrive home to insure a convenient appointment time. 10. OTHER INSTRUCTIONS: __________________________________________________________________________________________________________________________ __________________________________________________________________________________________________________________________ WHEN TO CALL YOUR DOCTOR: 1. Fever over 101.0 2. Inability to urinate 3. Continued bleeding from incision. 4. Increased pain, redness, or drainage from the incision. 5. Increasing abdominal pain  The clinic staff is available to answer your questions during regular business hours.  Please don't hesitate to call and ask to speak to one of the nurses for clinical concerns.  If you have a medical emergency, go to the nearest emergency room or call 911.  A surgeon from [facility] is always on call at the hospital. [address], [address], [place], [place]  [address] ? [address] [address], [place],    [address] [phone] ? [phone] ? [phone] Web site: [email or web address]  

## 2014-12-05 NOTE — H&P (Signed)
  63 yof I have seen previously for epigastric and bilateral uq pain. She has more nausea associated with this now. This is occurring still every couple weeks associated with heavy eating. She had prior us that shows multiple nonshadowing echogenic structures, nl gb wall. She recently [place] follow up colonoscopy which she reports as normal. She does have diverticulosis. She returns today to discuss possible cholecystectomy. I do think some of her symptoms are related to her gallbladder.  Other Problems  Arthritis Back Pain Depression Diabetes Mellitus Diverticulosis Hypercholesterolemia Migraine Headache Sleep Apnea  Past Surgical History  Colon Polyp Removal - Colonoscopy Hysterectomy (not due to cancer) - Partial Shoulder Surgery Right.  Allergies No Known Drug Allergies11/13/2015  Medication History  Albuterol Sulfate HFA (108MCG/ACT Aerosol Soln, Inhalation) Active. Augmentin (875-125MG  Tablet, Oral) Active. Baby Aspirin (81MG  Tablet Chewable, Oral) Active. Lipitor (40MG  Tablet, Oral) Active. Prevacid (30MG  Capsule DR, Oral) Active. Hyzaar (50-12.5MG  Tablet, Oral) Active. MetFORMIN HCl (500MG  Tablet, Oral) Active. Promethazine-Codeine (6.25-10MG /5ML Syrup, Oral) Active. Mucinex D (60-600MG  Tablet ER 12HR, Oral) Active. Ultram (50MG  Tablet, Oral) Active. Effexor (50MG  Tablet, Oral) Active. Ambien (5MG  Tablet, Oral) Active. Medications Reconciled  Social History  Alcohol use Occasional alcohol use. Caffeine use Carbonated beverages, Coffee, Tea. No drug use Tobacco use Former smoker.  Vitals  Weight: 148 lb Height: 60in Body Surface Area: 1.69 m Body Mass Index: 28.9 kg/m Temp.: 97.53F(Temporal)  Pulse: 88 (Regular)  Resp.: 15 (Unlabored)  BP: 122/68 (Sitting, Left Arm, Standard) Physical Exam  General Mental Status-Alert. Orientation-Oriented X3. Chest and Lung Exam Chest and lung exam reveals -on  auscultation, normal breath sounds, no adventitious sounds and normal vocal resonance. Cardiovascular Cardiovascular examination reveals -normal heart sounds, regular rate and rhythm with no murmurs. Abdomen Note: soft, tender ruq to palpation, no murphys sign  Assessment & Plan  SYMPTOMATIC CHOLELITHIASIS (574.20  K80.20) Story: Laparoscopic cholecystectomy I discussed the procedure in detail. We discussed the risks and benefits of a laparoscopic cholecystectomy and possible cholangiogram including, but not limited to bleeding, infection, injury to surrounding structures such as the intestine or liver, bile leak, retained gallstones, need to convert to an open procedure, prolonged diarrhea, blood clots such as DVT, common bile duct injury, anesthesia risks, and possible need for additional procedures. The likelihood of improvement in symptoms and return to the patient's normal status is good. We discussed the typical post-operative recovery course.

## 2014-12-06 ENCOUNTER — Encounter (HOSPITAL_COMMUNITY): Payer: Self-pay | Admitting: General Surgery

## 2014-12-06 DIAGNOSIS — K8020 Calculus of gallbladder without cholecystitis without obstruction: Secondary | ICD-10-CM | POA: Diagnosis not present

## 2014-12-06 MED ORDER — OXYCODONE HCL 5 MG PO TABS: 5.0000 mg | ORAL_TABLET | ORAL | Status: DC | PRN

## 2014-12-06 NOTE — Progress Notes (Signed)
UR completed 

## 2014-12-06 NOTE — Discharge Summary (Signed)
Physician Discharge Summary  Patient ID: Meghan Hickman MRN: [id number] DOB/AGE: May 29, 1951 64 y.o.  Admit date: 12/05/2014 Discharge date: 12/06/2014  Admission Diagnoses: Biliary colic  Discharge Diagnoses:  Active Problems:   S/P laparoscopic cholecystectomy   Discharged Condition: good  Hospital Course: 56 yof admitted who underwent uneventful laparoscopic cholecystectomy.  She is doing well the following day and will be discharged   Consults: None  Significant Diagnostic Studies: none  Treatments: surgery: lap chole  Discharge Exam: Blood pressure 130/72, pulse 65, temperature 98.7 F (37.1 C), temperature source Oral, resp. rate 17, height 5' (1.524 m), weight 68.4 kg (150 lb 12.7 oz), SpO2 93 %. Incision/Wound:abdomen soft wounds clean   Disposition: 01-Home or Self Care     Medication List    TAKE these medications        albuterol 108 (90 BASE) MCG/ACT inhaler  Commonly known as:  PROVENTIL HFA;VENTOLIN HFA  Inhale 2 puffs into the lungs every 4 (four) hours as needed for wheezing or shortness of breath (cough, shortness of breath or wheezing.).     almotriptan 12.5 MG tablet  Commonly known as:  AXERT  Take 12.5 mg by mouth daily as needed for migraine.     amoxicillin-clavulanate 875-125 MG per tablet  Commonly known as:  AUGMENTIN  Take 1 tablet by mouth 2 (two) times daily.     aspirin 81 MG tablet  Take 81 mg by mouth daily.     atorvastatin 40 MG tablet  Commonly known as:  LIPITOR  Take 1 tablet (40 mg total) by mouth daily.     ibuprofen 200 MG tablet  Commonly known as:  ADVIL,MOTRIN  Take 200 mg by mouth every 6 (six) hours as needed for mild pain or moderate pain.     lansoprazole 30 MG capsule  Commonly known as:  PREVACID  Take 1 capsule (30 mg total) by mouth daily.     losartan-hydrochlorothiazide 50-12.5 MG per tablet  Commonly known as:  HYZAAR  Take 1 tablet by mouth daily.     metFORMIN 500 MG tablet  Commonly known  as:  GLUCOPHAGE  Take 1 tablet (500 mg total) by mouth daily.     MULTIVITAMIN & MINERAL PO  Take 1 tablet by mouth daily.     oxyCODONE 5 MG immediate release tablet  Commonly known as:  Oxy IR/ROXICODONE  Take 1-2 tablets (5-10 mg total) by mouth every 4 (four) hours as needed for moderate pain.     promethazine-codeine 6.25-10 MG/5ML syrup  Commonly known as:  PHENERGAN with CODEINE  Take 5-10 mLs by mouth every 4 (four) hours as needed for cough.     pseudoephedrine-guaifenesin 60-600 MG per tablet  Commonly known as:  MUCINEX D  Take 1 tablet by mouth every 12 (twelve) hours.     sucralfate 1 G tablet  Commonly known as:  CARAFATE  1 tab 1 h ac and hs     temazepam 30 MG capsule  Commonly known as:  RESTORIL  TAKE ONE CAPSULE AT BEDTIME AS NEEDED FOR SLEEP     traMADol 50 MG tablet  Commonly known as:  ULTRAM  Take 1 tablet (50 mg total) by mouth every 6 (six) hours as needed.     venlafaxine 50 MG tablet  Commonly known as:  EFFEXOR  Take 1 tablet (50 mg total) by mouth daily. PATIENT NEEDS OFFICE VISIT FOR ADDITIONAL REFILLS     zolpidem 5 MG tablet  Commonly known as:  AMBIEN  Take 1 tablet (5 mg total) by mouth at bedtime as needed for sleep. Take one/half to one tab at bedtime as needed           Follow-up Information    [facility] We[facility], [facility] In 3 weeks.   Specialty:  General Surgery   Contact information:   [address] [place] [address] [phone]       Signed: Emelia LoronWAKEFIELD,Cruzito Standre 12/06/2014, 7:14 AM

## 2015-01-20 ENCOUNTER — Encounter: Payer: Self-pay | Admitting: *Deleted

## 2015-03-11 ENCOUNTER — Other Ambulatory Visit: Payer: Self-pay | Admitting: Emergency Medicine

## 2015-03-13 ENCOUNTER — Telehealth: Payer: Self-pay | Admitting: *Deleted

## 2015-03-13 ENCOUNTER — Other Ambulatory Visit: Payer: Self-pay | Admitting: Emergency Medicine

## 2015-03-13 DIAGNOSIS — F329 Major depressive disorder, single episode, unspecified: Secondary | ICD-10-CM

## 2015-03-13 DIAGNOSIS — F32A Depression, unspecified: Secondary | ICD-10-CM

## 2015-03-13 MED ORDER — VENLAFAXINE HCL 50 MG PO TABS: 50.0000 mg | ORAL_TABLET | Freq: Every day | ORAL | Status: DC

## 2015-03-13 NOTE — Telephone Encounter (Signed)
Call patient and let her know I sent in a refill of her Effexor. I will discuss this medication with her on her visit of September 15. Be sure she is aware of serotonin syndrome since she sometimes takes tramadol.

## 2015-03-13 NOTE — Telephone Encounter (Signed)
Faxed mammo request to [facility] (Dr. Marzetta Board office).  Will update once received.

## 2015-03-13 NOTE — Telephone Encounter (Signed)
Spoke with pt, advised message from Dr. Daub. Pt understood. 

## 2015-03-13 NOTE — Telephone Encounter (Signed)
Patient had phoned in response to my mammo ltr - returned her call (she gets her mammo's through her gyn - Dr. Ilda Mori). Scheduled patient with PCP for 04/24/15 @ 1000.  Pt requested refill for her Effexor.  Told her I would forward request to PCP.  CB [phone] [facility] on [address] (one listed on MAR is accurate)

## 2015-03-14 NOTE — Telephone Encounter (Signed)
Received faxed mammo report dated 04/29/14 - no mammographic evidence of malignancy.  Updated health maintenance, abstracted, and sent to PCP for review.

## 2015-04-24 ENCOUNTER — Encounter: Payer: Self-pay | Admitting: Emergency Medicine

## 2015-04-24 ENCOUNTER — Other Ambulatory Visit: Payer: Self-pay | Admitting: Emergency Medicine

## 2015-04-24 ENCOUNTER — Ambulatory Visit (INDEPENDENT_AMBULATORY_CARE_PROVIDER_SITE_OTHER): Payer: Federal, State, Local not specified - PPO | Admitting: Emergency Medicine

## 2015-04-24 VITALS — BP 133/80 | HR 97 | Temp 98.6°F | Resp 16 | Ht 60.0 in | Wt 145.0 lb

## 2015-04-24 DIAGNOSIS — Z0000 Encounter for general adult medical examination without abnormal findings: Secondary | ICD-10-CM

## 2015-04-24 DIAGNOSIS — Z1159 Encounter for screening for other viral diseases: Secondary | ICD-10-CM

## 2015-04-24 DIAGNOSIS — Z23 Encounter for immunization: Secondary | ICD-10-CM

## 2015-04-24 DIAGNOSIS — E119 Type 2 diabetes mellitus without complications: Secondary | ICD-10-CM

## 2015-04-24 DIAGNOSIS — E785 Hyperlipidemia, unspecified: Secondary | ICD-10-CM | POA: Diagnosis not present

## 2015-04-24 DIAGNOSIS — F32A Depression, unspecified: Secondary | ICD-10-CM

## 2015-04-24 DIAGNOSIS — R0989 Other specified symptoms and signs involving the circulatory and respiratory systems: Secondary | ICD-10-CM | POA: Diagnosis not present

## 2015-04-24 DIAGNOSIS — I10 Essential (primary) hypertension: Secondary | ICD-10-CM

## 2015-04-24 DIAGNOSIS — R011 Cardiac murmur, unspecified: Secondary | ICD-10-CM

## 2015-04-24 DIAGNOSIS — M25511 Pain in right shoulder: Secondary | ICD-10-CM | POA: Diagnosis not present

## 2015-04-24 DIAGNOSIS — Z114 Encounter for screening for human immunodeficiency virus [HIV]: Secondary | ICD-10-CM

## 2015-04-24 DIAGNOSIS — F329 Major depressive disorder, single episode, unspecified: Secondary | ICD-10-CM | POA: Diagnosis not present

## 2015-04-24 LAB — COMPLETE METABOLIC PANEL WITH GFR
ALK PHOS: 85 U/L (ref 33–130)
ALT: 16 U/L (ref 6–29)
AST: 19 U/L (ref 10–35)
Albumin: 4.3 g/dL (ref 3.6–5.1)
BUN: 13 mg/dL (ref 7–25)
CALCIUM: 9.6 mg/dL (ref 8.6–10.4)
CHLORIDE: 104 mmol/L (ref 98–110)
CO2: 29 mmol/L (ref 20–31)
Creat: 0.86 mg/dL (ref 0.50–0.99)
GFR, EST AFRICAN AMERICAN: 82 mL/min (ref >=60)
GFR, EST NON AFRICAN AMERICAN: 71 mL/min (ref >=60)
Glucose, Bld: 92 mg/dL (ref 65–99)
POTASSIUM: 3.8 mmol/L (ref 3.5–5.3)
Sodium: 141 mmol/L (ref 135–146)
Total Bilirubin: 0.9 mg/dL (ref 0.2–1.2)
Total Protein: 7.1 g/dL (ref 6.1–8.1)

## 2015-04-24 LAB — POCT URINALYSIS DIPSTICK
BILIRUBIN UA: NEGATIVE
GLUCOSE UA: NEGATIVE
Ketones, UA: NEGATIVE
Leukocytes, UA: NEGATIVE
Nitrite, UA: NEGATIVE
Protein, UA: NEGATIVE
RBC UA: NEGATIVE
Urobilinogen, UA: 0.2
pH, UA: 5

## 2015-04-24 LAB — CBC WITH DIFFERENTIAL/PLATELET
BASOS PCT: 1 % (ref 0–1)
Basophils Absolute: 0.1 10*3/uL (ref 0.0–0.1)
EOS ABS: 0.2 10*3/uL (ref 0.0–0.7)
EOS PCT: 2 % (ref 0–5)
HCT: 38.5 % (ref 36.0–46.0)
Hemoglobin: 12.9 g/dL (ref 12.0–15.0)
LYMPHS ABS: 3.2 10*3/uL (ref 0.7–4.0)
Lymphocytes Relative: 37 % (ref 12–46)
MCH: 27.4 pg (ref 26.0–34.0)
MCHC: 33.5 g/dL (ref 30.0–36.0)
MCV: 81.9 fL (ref 78.0–100.0)
MONOS PCT: 6 % (ref 3–12)
MPV: 9.6 fL (ref 8.6–12.4)
Monocytes Absolute: 0.5 10*3/uL (ref 0.1–1.0)
Neutro Abs: 4.7 10*3/uL (ref 1.7–7.7)
Neutrophils Relative %: 54 % (ref 43–77)
PLATELETS: 379 10*3/uL (ref 150–400)
RBC: 4.7 MIL/uL (ref 3.87–5.11)
RDW: 14 % (ref 11.5–15.5)
WBC: 8.7 10*3/uL (ref 4.0–10.5)

## 2015-04-24 LAB — HIV ANTIBODY (ROUTINE TESTING W REFLEX): HIV 1&2 Ab, 4th Generation: NONREACTIVE

## 2015-04-24 LAB — LIPID PANEL
Cholesterol: 183 mg/dL (ref 125–200)
HDL: 55 mg/dL (ref >=46)
LDL Cholesterol: 111 mg/dL (ref <130)
Total CHOL/HDL Ratio: 3.3 Ratio (ref <=5.0)
Triglycerides: 85 mg/dL (ref <150)
VLDL: 17 mg/dL (ref <30)

## 2015-04-24 LAB — MICROALBUMIN, URINE: MICROALB UR: 0.9 mg/dL (ref <2.0)

## 2015-04-24 LAB — HEPATITIS C ANTIBODY: HCV AB: NEGATIVE

## 2015-04-24 LAB — GLUCOSE, POCT (MANUAL RESULT ENTRY): POC GLUCOSE: 106 mg/dL — AB (ref 70–99)

## 2015-04-24 LAB — POCT GLYCOSYLATED HEMOGLOBIN (HGB A1C): Hemoglobin A1C: 5.8

## 2015-04-24 MED ORDER — TRAMADOL HCL 50 MG PO TABS: 50.0000 mg | ORAL_TABLET | Freq: Four times a day (QID) | ORAL | Status: DC | PRN

## 2015-04-24 MED ORDER — METFORMIN HCL 500 MG PO TABS: 500.0000 mg | ORAL_TABLET | Freq: Every day | ORAL | Status: DC

## 2015-04-24 MED ORDER — LOSARTAN POTASSIUM-HCTZ 50-12.5 MG PO TABS: 1.0000 | ORAL_TABLET | Freq: Every day | ORAL | Status: DC

## 2015-04-24 MED ORDER — ATORVASTATIN CALCIUM 40 MG PO TABS: 40.0000 mg | ORAL_TABLET | Freq: Every day | ORAL | Status: DC

## 2015-04-24 NOTE — Patient Instructions (Signed)
[facility] [address] [place] [address] [phone]  Shingles Vaccine What You Need to Know WHAT IS SHINGLES?  Shingles is a painful skin rash, often with blisters. It is also called Herpes Zoster or just Zoster.  A shingles rash usually appears on one side of the face or body and lasts from 2 to 4 weeks. Its main symptom is pain, which can be quite severe. Other symptoms of shingles can include fever, headache, chills, and upset stomach. Very rarely, a shingles infection can lead to pneumonia, hearing problems, blindness, brain inflammation (encephalitis), or death.  For about 1 person in 5, severe pain can continue even after the rash clears up. This is called post-herpetic neuralgia.  Shingles is caused by the Varicella Zoster virus. This is the same virus that causes chickenpox. Only someone who has had a case of chickenpox or rarely, has gotten chickenpox vaccine, can get shingles. The virus stays in your body. It can reappear many years later to cause a case of shingles.  You cannot catch shingles from another person with shingles. However, a person who has never had chickenpox (or chickenpox vaccine) could get chickenpox from someone with shingles. This is not very common.  Shingles is far more common in people 66 and older than in younger people. It is also more common in people whose immune systems are weakened because of a disease such as cancer or drugs such as steroids or chemotherapy.  At least 1 million people get shingles per year in the [place]. SHINGLES VACCINE  A vaccine for shingles was licensed in 2006. In clinical trials, the vaccine reduced the risk of shingles by 50%. It can also reduce the pain in people who still get shingles after being vaccinated.  A single dose of shingles vaccine is recommended for adults 106 years of age and older. SOME PEOPLE SHOULD NOT GET SHINGLES VACCINE OR SHOULD WAIT A person should not get shingles  vaccine if he or she:  Has ever had a life-threatening allergic reaction to gelatin, the antibiotic neomycin, or any other component of shingles vaccine. Tell your caregiver if you have any severe allergies.  Has a weakened immune system because of current:  AIDS or another disease that affects the immune system.  Treatment with drugs that affect the immune system, such as prolonged use of high-dose steroids.  Cancer treatment, such as radiation or chemotherapy.  Cancer affecting the bone marrow or lymphatic system, such as leukemia or lymphoma.  Is pregnant, or might be pregnant. Women should not become pregnant until at least 4 weeks after getting shingles vaccine. Someone with a minor illness, such as a cold, may be vaccinated. Anyone with a moderate or severe acute illness should usually wait until he or she recovers before getting the vaccine. This includes anyone with a temperature of 101.3 F (38 C) or higher. WHAT ARE THE RISKS FROM SHINGLES VACCINE?  A vaccine, like any medicine, could possibly cause serious problems, such as severe allergic reactions. However, the risk of a vaccine causing serious harm, or death, is extremely small.  No serious problems have been identified with shingles vaccine. Mild Problems  Redness, soreness, swelling, or itching at the site of the injection (about 1 person in 3).  Headache (about 1 person in 70). Like all vaccines, shingles vaccine is being closely monitored for unusual or severe problems. WHAT IF THERE IS A MODERATE OR SEVERE REACTION? What should I look for? Any unusual condition, such as a  severe allergic reaction or a high fever. If a severe allergic reaction occurred, it would be within a few minutes to an hour after the shot. Signs of a serious allergic reaction can include difficulty breathing, weakness, hoarseness or wheezing, a fast heartbeat, hives, dizziness, paleness, or swelling of the throat. What should I do?  Call your  caregiver, or get the person to a caregiver right away.  Tell the caregiver what happened, the date and time it happened, and when the vaccination was given.  Ask the caregiver to report the reaction by filing a Vaccine Adverse Event Reporting System (VAERS) form. Or, you can file this report through the VAERS web site at www.vaers.LAgents.no or by calling 1-979-220-6095. VAERS does not provide medical advice. HOW CAN I LEARN MORE?  Ask your caregiver. He or she can give you the vaccine package insert or suggest other sources of information.  Contact the Centers for Disease Control and Prevention (CDC):  Call 2232829189 (1-800-CDC-INFO).  Visit the CDC website at PicCapture.uy CDC Shingles Vaccine VIS (05/14/08) Document Released: 05/23/2006 Document Revised: 10/18/2011 Document Reviewed: 11/15/2012 [facility] Patient Information 2015 [place], [place]. This information is not intended to replace advice given to you by your health care provider. Make sure you discuss any questions you have with your health care provider. Pneumococcal Vaccine, Polyvalent suspension for injection What is this medicine? PNEUMOCOCCAL VACCINE, POLYVALENT (NEU mo KOK al vak SEEN, pol ee VEY luhnt) is a vaccine to prevent pneumococcus bacteria infection. These bacteria are a major cause of ear infections, 'Strep throat' infections, and serious pneumonia, meningitis, or blood infections worldwide. These vaccines help the body to produce antibodies (protective substances) that help your body defend against these bacteria. This vaccine is recommended for infants and young children. This vaccine will not treat an infection. This medicine may be used for other purposes; ask your health care provider or pharmacist if you have questions. COMMON BRAND NAME(S): Prevnar 13 What should I tell my health care provider before I take this medicine? They need to know if you have any of these conditions: -bleeding  problems -fever -immune system problems -low platelet count in the blood -seizures -an unusual or allergic reaction to pneumococcal vaccine, diphtheria toxoid, other vaccines, latex, other medicines, foods, dyes, or preservatives -pregnant or trying to get pregnant -breast-feeding How should I use this medicine? This vaccine is for injection into a muscle. It is given by a health care professional. A copy of Vaccine Information Statements will be given before each vaccination. Read this sheet carefully each time. The sheet may change frequently. Talk to your pediatrician regarding the use of this medicine in children. While this drug may be prescribed for children as young as 63 weeks old for selected conditions, precautions do apply. Overdosage: If you think you have taken too much of this medicine contact a poison control center or emergency room at once. NOTE: This medicine is only for you. Do not share this medicine with others. What if I miss a dose? It is important not to miss your dose. Call your doctor or health care professional if you are unable to keep an appointment. What may interact with this medicine? -medicines for cancer chemotherapy -medicines that suppress your immune function -medicines that treat or prevent blood clots like warfarin, enoxaparin, and dalteparin -steroid medicines like prednisone or cortisone This list may not describe all possible interactions. Give your health care provider a list of all the medicines, herbs, non-prescription drugs, or dietary supplements you use. Also  tell them if you smoke, drink alcohol, or use illegal drugs. Some items may interact with your medicine. What should I watch for while using this medicine? Mild fever and pain should go away in 3 days or less. Report any unusual symptoms to your doctor or health care professional. What side effects may I notice from receiving this medicine? Side effects that you should report to your doctor  or health care professional as soon as possible: -allergic reactions like skin rash, itching or hives, swelling of the face, lips, or tongue -breathing problems -confused -fever over 102 degrees F -pain, tingling, numbness in the hands or feet -seizures -unusual bleeding or bruising -unusual muscle weakness Side effects that usually do not require medical attention (report to your doctor or health care professional if they continue or are bothersome): -aches and pains -diarrhea -fever of 102 degrees F or less -headache -irritable -loss of appetite -pain, tender at site where injected -trouble sleeping This list may not describe all possible side effects. Call your doctor for medical advice about side effects. You may report side effects to FDA at 1-800-FDA-1088. Where should I keep my medicine? This does not apply. This vaccine is given in a clinic, pharmacy, doctor's office, or other health care setting and will not be stored at home. NOTE: This sheet is a summary. It may not cover all possible information. If you have questions about this medicine, talk to your doctor, pharmacist, or health care provider.  2015, Elsevier/Gold Standard. (2008-10-08 10:17:22)

## 2015-04-24 NOTE — Progress Notes (Signed)
Subjective:  This chart was scribed for Collene Gobble, MD by Veverly Fells, at [facility].  This patient was seen in room 23 and the patient's care was started at 10:46 AM.    Patient ID: Meghan Hickman, female    DOB: July 26, 1951, 64 y.o.   MRN: [id number] Chief Complaint  Patient presents with  . Annual Exam    HPI HPI Comments: Meghan Hickman is a 64 y.o. female who presents to the [facility] for an annual physical exam.  Patient states that she has been feeling weak lately and doesn't think her sugar levels are doing well.  She states that she cannot go long without eating and has tingling in her right hand occasionally.  Patient does not have a cardiologist.  Her orthopedist (is at [facility] orth[facility]ics- surgery performed by Dr. Ave Filter).  She has a gynocologist who she sees once a year (Dr. Arlyce Dice) and is up to date with her mammogram and colonoscopy.  Patient revieved her flu shot today and is not interested in a shingles or pneumonia vaccination.    Patient states that she always has things on her mind.  Her mother died three years ago and she feels like she is still suffering with coping with the dead.  Her mother did not live with her.  She [place] taking Effexor and states that it helps and does not think she needs therapy.    Patient Active Problem List   Diagnosis Date Noted  . S/P laparoscopic cholecystectomy 12/05/2014  . Heart murmur 02/11/2013  . Type II or unspecified type diabetes mellitus without mention of complication, not stated as uncontrolled 02/09/2013  . Other and unspecified hyperlipidemia 02/09/2013   Past Medical History  Diagnosis Date  . Arthritis   . Diabetes mellitus without complication   . Hyperlipidemia   . Heart murmur   . Hypertension   . Wears glasses   . Headache   . GERD (gastroesophageal reflux disease)    Past Surgical History  Procedure Laterality Date  . Shoulder arthroscopy with  subacromial decompression and bicep tendon repair Right 04/23/2013    Procedure: SHOULDER ARTHROSCOPY WITH SUBACROMIAL DECOMPRESSION AND BICEP TENDON TENOTOMY;  Surgeon: Mable Paris, MD;  Location: [facility];  Service: Orthopedics;  Laterality: Right;  . Cholecystectomy N/A 12/05/2014    Procedure: LAPAROSCOPIC CHOLECYSTECTOMY;  Surgeon: Emelia Loron, MD;  Location: [facility] OR;  Service: General;  Laterality: N/A;   No Known Allergies Prior to Admission medications   Medication Sig Start Date End Date Taking? Authorizing Provider  almotriptan (AXERT) 12.5 MG tablet Take 12.5 mg by mouth daily as needed for migraine.  09/23/14   Historical Provider, MD  aspirin 81 MG tablet Take 81 mg by mouth daily.    Historical Provider, MD  atorvastatin (LIPITOR) 40 MG tablet Take 1 tablet (40 mg total) by mouth daily. Patient taking differently: Take 40 mg by mouth daily at 6 PM.  01/08/14   Carmelina Dane, MD  ibuprofen (ADVIL,MOTRIN) 200 MG tablet Take 200 mg by mouth every 6 (six) hours as needed for mild pain or moderate pain.    Historical Provider, MD  losartan-hydrochlorothiazide (HYZAAR) 50-12.5 MG per tablet Take 1 tablet by mouth daily. 01/08/14 01/08/15  Carmelina Dane, MD  metFORMIN (GLUCOPHAGE) 500 MG tablet Take 1 tablet (500 mg total) by mouth daily. 01/08/14   Carmelina Dane, MD  Multiple Vitamins-Minerals (MULTIVITAMIN & MINERAL PO) Take  1 tablet by mouth daily.    Historical Provider, MD  oxyCODONE (OXY IR/ROXICODONE) 5 MG immediate release tablet Take 1-2 tablets (5-10 mg total) by mouth every 4 (four) hours as needed for moderate pain. 12/06/14   Emelia Loron, MD  temazepam (RESTORIL) 30 MG capsule TAKE ONE CAPSULE AT BEDTIME AS NEEDED FOR SLEEP 09/25/14   Collene Gobble, MD  traMADol (ULTRAM) 50 MG tablet Take 1 tablet (50 mg total) by mouth every 6 (six) hours as needed. 06/04/14   Thao P Le, DO  venlafaxine (EFFEXOR) 50 MG tablet Take 1 tablet (50 mg total)  by mouth daily. 03/13/15   Collene Gobble, MD  zolpidem (AMBIEN) 5 MG tablet Take 1 tablet (5 mg total) by mouth at bedtime as needed for sleep. Take one/half to one tab at bedtime as needed Patient not taking: Reported on 11/27/2014 02/09/13   Collene Gobble, MD   Social History   Social History  . Marital Status: Single    Spouse Name: N/A  . Number of Children: N/A  . Years of Education: N/A   Occupational History  . Not on file.   Social History Main Topics  . Smoking status: Former Smoker -- 0.20 packs/day for 10 years    Types: Cigarettes    Start date: 04/30/2012    Quit date: 08/16/2012  . Smokeless tobacco: Not on file  . Alcohol Use: No  . Drug Use: No  . Sexual Activity: Not on file     Comment: was smoking 1/2 pppd   Other Topics Concern  . Not on file   Social History Narrative     Review of Systems  Constitutional: Negative for fever and chills.  Respiratory: Negative for cough, choking and shortness of breath.   Cardiovascular: Negative for chest pain.  Gastrointestinal: Negative for nausea and vomiting.  Musculoskeletal: Negative for neck pain and neck stiffness.  Neurological: Positive for weakness and numbness. Negative for syncope.       Objective:   Physical Exam    Filed Vitals:   04/24/15 1007  BP: 133/80  Pulse: 97  Temp: 98.6 F (37 C)  TempSrc: Oral  Resp: 16  Height: 5' (1.524 m)  Weight: 145 lb (65.772 kg)   CONSTITUTIONAL: Well developed/well nourished HEAD: Normocephalic/atraumatic EYES: EOMI/PERRL ENMT: Mucous membranes moist NECK: supple no meningeal signs SPINE/BACK:entire spine nontender CV: She has a faint left carotid bruit  LUNGS: Lungs are clear to auscultation bilaterally, no apparent distress ABDOMEN: soft, nontender, no rebound or guarding, bowel sounds noted throughout abdomen GU:no cva tenderness NEURO: Pt is awake/alert/appropriate, moves all extremitiesx4.  No facial droop.   EXTREMITIES: pulses normal/equal,  full ROM SKIN: warm, color normal PSYCH: She is depressed appearing bu in no distress Breast exam deferred as patient sees gynocologist.          Assessment & Plan:  1. Type 2 diabetes mellitus without complication Results for orders placed or performed in visit on 04/24/15  POCT glucose (manual entry)  Result Value Ref Range   POC Glucose 106 (A) 70 - 99 mg/dl  POCT glycosylated hemoglobin (Hb A1C)  Result Value Ref Range   Hemoglobin A1C 5.8   POCT urinalysis dipstick  Result Value Ref Range   Color, UA yellow    Clarity, UA clear    Glucose, UA neg    Bilirubin, UA neg    Ketones, UA neg    Spec Grav, UA >=1.030    Blood, UA neg  pH, UA 5.0    Protein, UA neg    Urobilinogen, UA 0.2    Nitrite, UA neg    Leukocytes, UA Negative Negative   - CBC with Differential/Platelet - POCT glucose (manual entry) - POCT glycosylated hemoglobin (Hb A1C) - POCT urinalysis dipstick - Microalbumin, urine - Lipid panel - COMPLETE METABOLIC PANEL WITH GFR - HM Diabetes Foot Exam - metFORMIN (GLUCOPHAGE) 500 MG tablet; Take 1 tablet (500 mg total) by mouth daily.  Dispense: 90 tablet; Refill: 3  2. Hyperlipidemia  - CBC with Differential/Platelet - atorvastatin (LIPITOR) 40 MG tablet; Take 1 tablet (40 mg total) by mouth daily at 6 PM.  Dispense: 90 tablet; Refill: 3  3. Depression Patient still recovering from the loss of her mother 3 years ago. She has been on Effexor 50 mg but I stopped this because of the interaction with Ultram. I recommended and gave her the number for hospice if she could start to receive some counseling for her depression she does not want to do this at present. - CBC with Differential/Platelet  4. Heart murmur I would like to proceed with carotid ultrasound first. - CBC with Differential/Platelet - EKG 12-Lead  5. Immunization due  - Flu Vaccine QUAD 36+ mos IM  6. Screening for HIV without presence of risk factors  - HIV antibody  7. Need  for hepatitis C screening test  - Hepatitis C antibody  8. Left carotid bruit  - US Carotid Duplex Bilateral; Future  9. Essential hypertension  - losartan-hydrochlorothiazide (HYZAAR) 50-12.5 MG per tablet; Take 1 tablet by mouth daily.  Dispense: 90 tablet; Refill: 3  10. Pain in joint, shoulder region, right  - traMADol (ULTRAM) 50 MG tablet; Take 1 tablet (50 mg total) by mouth every 6 (six) hours as needed.  Dispense: 30 tablet; Refill: 2   I personally performed the services described in this documentation, which was scribed in my presence. The recorded information has been reviewed and is accurate.  Lesle Chris, MD  [facility], [facility]  04/24/2015 2:11 PM

## 2015-04-25 LAB — TSH: TSH: 1.325 u[IU]/mL (ref 0.350–4.500)

## 2015-05-13 ENCOUNTER — Encounter: Payer: Self-pay | Admitting: Emergency Medicine

## 2015-05-13 ENCOUNTER — Encounter: Payer: Federal, State, Local not specified - PPO | Admitting: Emergency Medicine

## 2015-05-15 ENCOUNTER — Ambulatory Visit: Payer: Self-pay | Admitting: Emergency Medicine

## 2015-05-16 NOTE — Progress Notes (Signed)
This encounter was created in error - please disregard.

## 2015-05-22 ENCOUNTER — Encounter: Payer: Self-pay | Admitting: Emergency Medicine

## 2015-05-22 ENCOUNTER — Ambulatory Visit (INDEPENDENT_AMBULATORY_CARE_PROVIDER_SITE_OTHER): Payer: Federal, State, Local not specified - PPO | Admitting: Emergency Medicine

## 2015-05-22 VITALS — BP 128/76 | HR 109 | Temp 97.9°F | Resp 18 | Ht 60.0 in | Wt 149.0 lb

## 2015-05-22 DIAGNOSIS — F329 Major depressive disorder, single episode, unspecified: Secondary | ICD-10-CM | POA: Diagnosis not present

## 2015-05-22 DIAGNOSIS — R479 Unspecified speech disturbances: Secondary | ICD-10-CM

## 2015-05-22 DIAGNOSIS — R0989 Other specified symptoms and signs involving the circulatory and respiratory systems: Secondary | ICD-10-CM

## 2015-05-22 DIAGNOSIS — F32A Depression, unspecified: Secondary | ICD-10-CM

## 2015-05-22 NOTE — Progress Notes (Addendum)
This chart was scribed for Lesle Chris, MD by Broadus John, Medical Scribe. This patient was seen in Room 21 and the patient's care was started at 12:00 PM.  Chief Complaint:  Chief Complaint  Patient presents with  . Follow-up  . Depression    HPI: Meghan Hickman is a 64 y.o. female who reports to [facility] today for a follow up regarding her depression.  Pt notes that her condition is still the same. She reports that she has been experiencing grief due to family situation, and she indicates that she is not comfortable with her workmates. 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 Pt states that effexor is the only medication she has ever taken for depression. She indicates that she first started taking it for migraines which she still suffers from. Pt's neurologist is Dr. Neale Burly.   Pt reports that she could have had a possible symptom of a TIA. She notes that she was experiencing symptoms of slurred speech and blurred vision. She denies having any imaging done however due to being advised by Dr. Neale Burly that this will make no difference.  Pt indicates that she has been having trouble "forming her words" and is stuttering.   Pt reports that she is complaint with her HTN, DM, and HLD medications.   Pt is complaining from myalgia symptoms after receiving the flu vaccine.    Past Medical History  Diagnosis Date  . Arthritis   . Diabetes mellitus without complication (HCC)   . Hyperlipidemia   . Heart murmur   . Hypertension   . Wears glasses   . Headache   . GERD (gastroesophageal reflux disease)    Past Surgical History  Procedure Laterality Date  . Shoulder arthroscopy with subacromial decompression and bicep tendon repair Right 04/23/2013    Procedure: SHOULDER ARTHROSCOPY WITH SUBACROMIAL DECOMPRESSION AND BICEP TENDON TENOTOMY;  Surgeon: Mable Paris, MD;  Location: [facility];  Service: Orthopedics;  Laterality: Right;  . Cholecystectomy N/A 12/05/2014    Procedure: LAPAROSCOPIC CHOLECYSTECTOMY;  Surgeon: Emelia Loron, MD;  Location: [facility] OR;  Service: General;  Laterality: N/A;   Social History   Social History  . Marital Status: Single    Spouse Name: N/A  . Number of Children: N/A  . Years of Education: N/A   Social History Main Topics  . Smoking status: Former Smoker -- 0.20 packs/day for 10 years    Types: Cigarettes    Start date: 04/30/2012    Quit date: 08/16/2012  . Smokeless tobacco: None  . Alcohol Use: No  . Drug Use: No  . Sexual Activity: Not Asked     Comment: was smoking 1/2 pppd   Other Topics Concern  . None   Social History Narrative   No family history on file. No Known Allergies Prior to Admission medications   Medication Sig Start Date End Date Taking? Authorizing Provider  almotriptan (AXERT) 12.5 MG tablet Take 12.5 mg by mouth daily as needed for migraine.  09/23/14   Historical Provider, MD  aspirin 81 MG tablet Take 81 mg by mouth daily.    Historical Provider, MD  atorvastatin (LIPITOR) 40 MG tablet Take 1 tablet (40 mg total) by mouth daily at 6 PM. 04/24/15   Collene Gobble, MD  ibuprofen (ADVIL,MOTRIN) 200 MG  tablet Take 200 mg by mouth every 6 (six) hours as needed for mild pain or moderate pain.    Historical Provider, MD  losartan-hydrochlorothiazide (HYZAAR) 50-12.5 MG per tablet Take 1 tablet by mouth daily. 04/24/15 04/23/16  Collene GobbleSteven A Nea Gittens, MD  metFORMIN (GLUCOPHAGE) 500 MG tablet Take 1 tablet (500 mg total) by mouth daily. 04/24/15   Collene GobbleSteven A Holland Nickson, MD  Multiple Vitamins-Minerals (MULTIVITAMIN & MINERAL PO) Take 1 tablet by mouth daily.    Historical Provider, MD  oxyCODONE (OXY IR/ROXICODONE) 5 MG immediate release tablet Take 1-2 tablets (5-10 mg total) by mouth every 4 (four) hours as needed for moderate pain. 12/06/14   Emelia LoronMatthew Wakefield, MD  temazepam  (RESTORIL) 30 MG capsule TAKE ONE CAPSULE AT BEDTIME AS NEEDED FOR SLEEP 09/25/14   Collene GobbleSteven A Danett Palazzo, MD  traMADol (ULTRAM) 50 MG tablet Take 1 tablet (50 mg total) by mouth every 6 (six) hours as needed. 04/24/15   Collene GobbleSteven A Aala Ransom, MD     ROS: The patient has headache, myalgia, dysphoric mood, anxiety.   All other systems have been reviewed and were otherwise negative with the exception of those mentioned in the HPI and as above.    PHYSICAL EXAM: Filed Vitals:   05/22/15 1146  BP: 128/76  Pulse: 109  Temp: 97.9 F (36.6 C)  Resp: 18   Body mass index is 29.1 kg/(m^2).   General: Alert, no acute distress. Cooperative, mild stutter to her speech.  HEENT:  Normocephalic, atraumatic, oropharynx patent. Eye: Nonie HoyerOMI, [facility] Cardiovascular:  Tachycardia but with regular rhythm, no rubs murmurs or gallops. Faint Carotid bruits, radial pulse intact. No pedal edema.  Respiratory: Clear to auscultation bilaterally.  No wheezes, rales, or rhonchi.  No cyanosis, no use of accessory musculature Abdominal: No organomegaly, abdomen is soft and non-tender, positive bowel sounds.  No masses. Musculoskeletal: Gait intact. No edema, tenderness Skin: No rashes. Neurologic: Facial musculature symmetric. Psychiatric: Patient acts appropriately throughout our interaction. Lymphatic: No cervical or submandibular lymphadenopathy.   LABS:    EKG/XRAY:   [facility]   ASSESSMENT/PLAN: She was recently started on Neurontin for her headaches. She does have a raspy quality to her voice. She is currently concerned she may have had a stroke. We'll proceed with a Doppler study of her carotids as well as a CT head no contrast. I did not put her on an SSRI because she is Axert and also takes Ultram. We may consider drug like Depakote. Before doing this I would like to discuss any additional meds with her headache specialist Dr. Zachery ConchFriedman. On her follow-up visit will consider referral  to cardiology for their evaluation. By signing my name below, I, Rawaa Al Rifaie, attest that this documentation has been prepared under the direction and in the presence of Lesle ChrisSteven Laykin Rainone, MD.  Broadus Johnawaa Al Rifaie, Medical Scribe. 05/22/2015.  12:14 PM.   Gross sideeffects, risk and benefits, and alternatives of medications d/w patient. Patient is aware that all medications have potential sideeffects and we are unable to predict every sideeffect or drug-drug interaction that may occur.  Lesle ChrisSteven Ole Lafon MD 05/22/2015 12:00 PM

## 2015-05-22 NOTE — Patient Instructions (Signed)
We are not going to start a new antidepressant at the present time. You  will be scheduled for 8a ultrasound test of the carotid arteries. You will be scheduled for a CT of the head to evaluate the difficulty you're having with your speech.

## 2015-06-02 ENCOUNTER — Ambulatory Visit
Admission: RE | Admit: 2015-06-02 | Discharge: 2015-06-02 | Disposition: A | Discharge status: Home / Self Care | Payer: Federal, State, Local not specified - PPO | Source: Ambulatory Visit | Attending: Emergency Medicine | Admitting: Emergency Medicine

## 2015-06-02 DIAGNOSIS — R479 Unspecified speech disturbances: Secondary | ICD-10-CM

## 2015-06-02 DIAGNOSIS — R0989 Other specified symptoms and signs involving the circulatory and respiratory systems: Secondary | ICD-10-CM

## 2015-06-09 NOTE — Progress Notes (Signed)
Patient has an appointment to see Dr Cleta Albertsaub on 11/11 for a follow-up visit. Selena BattenKim

## 2015-06-20 ENCOUNTER — Ambulatory Visit (INDEPENDENT_AMBULATORY_CARE_PROVIDER_SITE_OTHER): Payer: Federal, State, Local not specified - PPO | Admitting: Emergency Medicine

## 2015-06-20 ENCOUNTER — Encounter: Payer: Self-pay | Admitting: Emergency Medicine

## 2015-06-20 VITALS — BP 112/90 | HR 88 | Temp 97.9°F | Resp 16 | Ht 59.0 in | Wt 152.4 lb

## 2015-06-20 DIAGNOSIS — R930 Abnormal findings on diagnostic imaging of skull and head, not elsewhere classified: Secondary | ICD-10-CM

## 2015-06-20 DIAGNOSIS — E041 Nontoxic single thyroid nodule: Secondary | ICD-10-CM | POA: Diagnosis not present

## 2015-06-20 DIAGNOSIS — R479 Unspecified speech disturbances: Secondary | ICD-10-CM | POA: Diagnosis not present

## 2015-06-20 NOTE — Progress Notes (Addendum)
This chart was scribed for Meghan Chris, MD by Stann Ore, Medical Scribe. This patient was seen in Room 22 and the patient's care was started 9:48 AM.  Chief Complaint:  Chief Complaint  Patient presents with  . Follow-up    1 month  . Headache    "not any better"  . Speech    "not any better"  . depression during triage    score 10    HPI: Meghan Hickman is a 64 y.o. female who reports to [facility] today for follow up.  Depression She still notices it. Her neurologist, Dr. Haskell Riling, has prescribed her gabapentin and it has calming effect; however, she feels that it's only a temporary relief. She believes that the source of her depression is due to her mother's death, that occurred 4 years ago. She has not had counseling or support groups.   Headaches She still gets them often. She still has some problems in her speech with forming her words and some stuttering.   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   Past Medical History  Diagnosis Date  . Arthritis   . Diabetes mellitus without complication (HCC)   . Hyperlipidemia   . Heart murmur   . Hypertension   . Wears glasses   . Headache   . GERD (gastroesophageal reflux disease)    Past Surgical History  Procedure Laterality Date  . Shoulder arthroscopy with subacromial decompression and bicep tendon repair Right 04/23/2013    Procedure: SHOULDER ARTHROSCOPY WITH SUBACROMIAL DECOMPRESSION AND BICEP TENDON TENOTOMY;  Surgeon: Mable Paris, MD;  Location: [facility];  Service: Orthopedics;  Laterality: Right;  . Cholecystectomy N/A 12/05/2014    Procedure: LAPAROSCOPIC CHOLECYSTECTOMY;  Surgeon: Emelia Loron, MD;  Location: [facility] OR;  Service: General;  Laterality: N/A;   Social History   Social History  . Marital Status: Single    Spouse Name: N/A  . Number of Children: N/A  . Years of Education: N/A   Social History Main  Topics  . Smoking status: Former Smoker -- 0.20 packs/day for 10 years    Types: Cigarettes    Start date: 04/30/2012    Quit date: 08/16/2012  . Smokeless tobacco: None  . Alcohol Use: No  . Drug Use: No  . Sexual Activity: Not Asked     Comment: was smoking 1/2 pppd   Other Topics Concern  . None   Social History Narrative   No family history on file. No Known Allergies Prior to Admission medications   Medication Sig Start Date End Date Taking? Authorizing Provider  almotriptan (AXERT) 12.5 MG tablet Take 12.5 mg by mouth daily as needed for migraine.  09/23/14   Historical Provider, MD  aspirin 81 MG tablet Take 81 mg by mouth daily.    Historical Provider, MD  atorvastatin (LIPITOR) 40 MG tablet Take 1 tablet (40 mg total) by mouth daily at 6 PM. 04/24/15   Collene Gobble, MD  gabapentin (NEURONTIN) 100 MG capsule Take 100 mg by mouth 3 (three) times daily.    Historical Provider, MD  ibuprofen (ADVIL,MOTRIN) 200 MG tablet Take 200 mg by mouth every 6 (six) hours as needed for mild pain or moderate pain.    Historical Provider, MD  losartan-hydrochlorothiazide (HYZAAR) 50-12.5 MG per tablet Take 1 tablet by mouth daily. 04/24/15 04/23/16  Collene Gobble, MD  metFORMIN (GLUCOPHAGE) 500 MG tablet  Take 1 tablet (500 mg total) by mouth daily. 04/24/15   Collene GobbleSteven A Lulani Bour, MD  Multiple Vitamins-Minerals (MULTIVITAMIN & MINERAL PO) Take 1 tablet by mouth daily.    Historical Provider, MD  oxyCODONE (OXY IR/ROXICODONE) 5 MG immediate release tablet Take 1-2 tablets (5-10 mg total) by mouth every 4 (four) hours as needed for moderate pain. 12/06/14   Emelia LoronMatthew Wakefield, MD  temazepam (RESTORIL) 30 MG capsule TAKE ONE CAPSULE AT BEDTIME AS NEEDED FOR SLEEP 09/25/14   Collene GobbleSteven A Mayumi Summerson, MD  traMADol (ULTRAM) 50 MG tablet Take 1 tablet (50 mg total) by mouth every 6 (six) hours as needed. 04/24/15   Collene GobbleSteven A Natalin Bible, MD     ROS:  Constitutional: negative for chills, fever, night sweats, weight changes, or  fatigue  HEENT: negative for vision changes, hearing loss, congestion, rhinorrhea, ST, epistaxis, or sinus pressure Cardiovascular: negative for chest pain or palpitations Respiratory: negative for hemoptysis, wheezing, shortness of breath, or cough Abdominal: negative for abdominal pain, nausea, vomiting, diarrhea, or constipation Dermatological: negative for rash Neurologic: negative for headache, dizziness, or syncope All other systems reviewed and are otherwise negative with the exception to those above and in the HPI.  PHYSICAL EXAM: Filed Vitals:   06/20/15 0943  BP: 112/90  Pulse: 88  Temp: 97.9 F (36.6 C)  Resp: 16   Body mass index is 30.76 kg/(m^2).   General: Alert, no acute distress HEENT:  Normocephalic, atraumatic, oropharynx patent.; Right thyroid nodule Eye: EOMI, [facility] Cardiovascular:  Regular rate and rhythm, no rubs murmurs or gallops.  No Carotid bruits, radial pulse intact. No pedal edema.  Respiratory: Clear to auscultation bilaterally.  No wheezes, rales, or rhonchi.  No cyanosis, no use of accessory musculature Abdominal: No organomegaly, abdomen is soft and non-tender, positive bowel sounds. No masses. Musculoskeletal: Gait intact. No edema, tenderness Skin: No rashes. Neurologic: Facial musculature symmetric. Psychiatric: Patient acts appropriately throughout our interaction.  Lymphatic: No cervical or submandibular lymphadenopathy Genitourinary/Anorectal: No acute findings   LABS:    EKG/XRAY:   Primary [facility]   ASSESSMENT:  We'll discuss with her neurologist, Dr. Zachery ConchFriedman will call me on Monday. I would like to get her on an antidepressant. I have already placed a referral to neurology for evaluation of small vessel disease.  I did not want to start a new medication without discussing her situation with Dr. Zachery ConchFriedman. Referral made for an ultrasound the thyroid to evaluate a right thyroid nodule seen on carotid  study. Patient declines pneumonia vaccine. I discussed this case with Dr. Zachery ConchFriedman. He is fine with starting Depakote extended release for her depression.  By signing my name below, I, Stann Oresung-Kai Tsai, attest that this documentation has been prepared under the direction and in the presence of Meghan ChrisSteven Draco Malczewski, MD. Electronically Signed: Stann Oresung-Kai Tsai, Scribe. 06/20/2015 , 9:48 AM .    Michaell CowingGross sideeffects, risk and benefits, and alternatives of medications d/w patient. Patient is aware that all medications have potential sideeffects and we are unable to predict every sideeffect or drug-drug interaction that may occur.  Meghan ChrisSteven Nikholas Geffre MD 06/20/2015 9:48 AM

## 2015-06-24 ENCOUNTER — Telehealth: Payer: Self-pay | Admitting: Emergency Medicine

## 2015-06-24 MED ORDER — DIVALPROEX SODIUM ER 500 MG PO TB24
ORAL_TABLET | ORAL | Status: DC
Start: 2015-06-24 — End: 2016-05-12

## 2015-06-24 NOTE — Telephone Encounter (Signed)
Lmom to call back. 

## 2015-06-24 NOTE — Telephone Encounter (Signed)
Call [name] and let her know I discussed her case with Dr. Zachery ConchFriedman. Try Depakote 1 a day for depression. See me in 6-8 weeks. I have sent in a prescription

## 2015-06-24 NOTE — Addendum Note (Signed)
Addended by: Lesle ChrisAUB, Sashay Felling A on: 06/24/2015 02:47 PM   Modules accepted: Orders

## 2015-06-24 NOTE — Telephone Encounter (Signed)
Pt.notified

## 2015-06-26 ENCOUNTER — Ambulatory Visit
Admission: RE | Admit: 2015-06-26 | Discharge: 2015-06-26 | Disposition: A | Discharge status: Home / Self Care | Payer: Federal, State, Local not specified - PPO | Source: Ambulatory Visit | Attending: Emergency Medicine | Admitting: Emergency Medicine

## 2015-06-26 DIAGNOSIS — E041 Nontoxic single thyroid nodule: Secondary | ICD-10-CM

## 2015-07-01 ENCOUNTER — Other Ambulatory Visit: Payer: Self-pay | Admitting: Radiology

## 2015-07-01 DIAGNOSIS — E041 Nontoxic single thyroid nodule: Secondary | ICD-10-CM

## 2015-07-08 ENCOUNTER — Encounter: Payer: Self-pay | Admitting: Neurology

## 2015-07-08 ENCOUNTER — Ambulatory Visit (INDEPENDENT_AMBULATORY_CARE_PROVIDER_SITE_OTHER): Payer: Federal, State, Local not specified - PPO | Admitting: Neurology

## 2015-07-08 VITALS — BP 116/92 | HR 88 | Ht 59.0 in | Wt 151.0 lb

## 2015-07-08 DIAGNOSIS — R9089 Other abnormal findings on diagnostic imaging of central nervous system: Secondary | ICD-10-CM | POA: Insufficient documentation

## 2015-07-08 DIAGNOSIS — R930 Abnormal findings on diagnostic imaging of skull and head, not elsewhere classified: Secondary | ICD-10-CM | POA: Diagnosis not present

## 2015-07-08 DIAGNOSIS — R479 Unspecified speech disturbances: Secondary | ICD-10-CM | POA: Diagnosis not present

## 2015-07-08 NOTE — Progress Notes (Signed)
PATIENT: Meghan GardenerLinda M Hickman DOB: 21-Jun-1951  Chief Complaint  Patient presents with  . Speech Problems    She started noticing a stuttering speech six months ago that has continued to worsen.  She would like to discuss her recent abnormal CT scan.       HISTORICAL  Meghan Hickman is a 64 years old right-handed female, alone at today's clinical visit, seen in refer by  her primary care physician Dr. Elgie CongoStephen Daub in July 08 2015 for evaluation of speech difficulty  She had a history of hypertension, hyperlipidemia, diabetes, depression, was recently started on Depakote 500 mg daily  She began to noticed gradual onset slurred speech since April 2016, initially attributed to her dental work, even after it was fixed, she continue have progressive slow talking, struggle with work, intermittent slurring, she denies chewing swallowing difficulty, she denies visual loss, she work as a Hydrographic surveyorpost office clerk, no limb muscle weakness,  She was recently fine to have enlarged thyroid, needle biopsy is planned, ultrasound of carotid artery was patent,  We have Reviewed CAT scan in October 2016, periventricular white matter disease, no acute lesions  REVIEW OF SYSTEMS: Full 14 system review of systems performed and notable only for headaches, numbness, slurred speech, insomnia, sleepiness, depression ALLERGIES: No Known Allergies  HOME MEDICATIONS: Current Outpatient Prescriptions  Medication Sig Dispense Refill  . almotriptan (AXERT) 12.5 MG tablet Take 12.5 mg by mouth daily as needed for migraine.   4  . aspirin 81 MG tablet Take 81 mg by mouth daily.    [name] Kitchen. atorvastatin (LIPITOR) 40 MG tablet Take 1 tablet (40 mg total) by mouth daily at 6 PM. 90 tablet 3  . divalproex (DEPAKOTE ER) 500 MG 24 hr tablet Take 1 tablet daily for depression. 30 tablet 11  . gabapentin (NEURONTIN) 100 MG capsule Take 100 mg by mouth 3 (three) times daily.    [name] Kitchen. ibuprofen (ADVIL,MOTRIN) 200 MG tablet Take 200 mg by  mouth every 6 (six) hours as needed for mild pain or moderate pain.    [name] Kitchen. losartan-hydrochlorothiazide (HYZAAR) 50-12.5 MG per tablet Take 1 tablet by mouth daily. 90 tablet 3  . metFORMIN (GLUCOPHAGE) 500 MG tablet Take 1 tablet (500 mg total) by mouth daily. 90 tablet 3  . Multiple Vitamins-Minerals (MULTIVITAMIN & MINERAL PO) Take 1 tablet by mouth daily.    [name] Kitchen. oxyCODONE (OXY IR/ROXICODONE) 5 MG immediate release tablet Take 1-2 tablets (5-10 mg total) by mouth every 4 (four) hours as needed for moderate pain. 25 tablet 0  . temazepam (RESTORIL) 30 MG capsule TAKE ONE CAPSULE AT BEDTIME AS NEEDED FOR SLEEP 30 capsule 2  . traMADol (ULTRAM) 50 MG tablet Take 1 tablet (50 mg total) by mouth every 6 (six) hours as needed. 30 tablet 2   No current facility-administered medications for this visit.    PAST MEDICAL HISTORY: Past Medical History  Diagnosis Date  . Arthritis   . Diabetes mellitus without complication (HCC)   . Hyperlipidemia   . Heart murmur   . Hypertension   . Wears glasses   . Headache   . GERD (gastroesophageal reflux disease)   . Speech problem     PAST SURGICAL HISTORY: Past Surgical History  Procedure Laterality Date  . Shoulder arthroscopy with subacromial decompression and bicep tendon repair Right 04/23/2013    Procedure: SHOULDER ARTHROSCOPY WITH SUBACROMIAL DECOMPRESSION AND BICEP TENDON TENOTOMY;  Surgeon: Mable ParisJustin William Chandler, MD;  Location: [facility];  Service:  Orthopedics;  Laterality: Right;  . Cholecystectomy N/A 12/05/2014    Procedure: LAPAROSCOPIC CHOLECYSTECTOMY;  Surgeon: Emelia Loron, MD;  Location: [facility] OR;  Service: General;  Laterality: N/A;    FAMILY HISTORY: Family History  Problem Relation Age of Onset  . Stroke Mother   . Hypertension Mother   . Diabetes Mother   . Breast cancer Mother   . Heart disease Brother   . Healthy Father     SOCIAL HISTORY:  Social History   Social History  . Marital Status: Single      Spouse Name: N/A  . Number of Children: 0  . Years of Education: 16   Occupational History  . Postal Solicitor    Social History Main Topics  . Smoking status: Former Smoker -- 0.20 packs/day for 10 years    Types: Cigarettes    Start date: 04/30/2012    Quit date: 08/16/2012  . Smokeless tobacco: Not on file  . Alcohol Use: No  . Drug Use: No  . Sexual Activity: Not on file     Comment: was smoking 1/2 pppd   Other Topics Concern  . Not on file   Social History Narrative   Lives at home alone.   Right-handed.   2 cups caffeine per day.     PHYSICAL EXAM   Filed Vitals:   07/08/15 1551  BP: 116/92  Pulse: 88  Height:  (1.499 m)  Weight: 151 lb (68.493 kg)    Not recorded      Body mass index is 30.48 kg/(m^2).  PHYSICAL EXAMNIATION:  Gen: NAD, conversant, well nourised, obese, well groomed                     Cardiovascular: Regular rate rhythm, no peripheral edema, warm, nontender. Eyes: Conjunctivae clear without exudates or hemorrhage Neck: Supple, no carotid bruise. Pulmonary: Clear to auscultation bilaterally   NEUROLOGICAL EXAM:  MENTAL STATUS: Speech:  Mild hesitation in her speech, mild slurred, but variable degree, struggle with the world, normal comprehension, Cognition:     Orientation to time, place and person     Normal recent and remote memory     Normal Attention span and concentration     Normal Language, naming, repeating,spontaneous speech     Fund of knowledge   CRANIAL NERVES: CN II: Visual fields are full to confrontation. Fundoscopic exam is normal with sharp discs and no vascular changes. Pupils are round equal and briskly reactive to light. CN III, IV, VI: extraocular movement are normal. No ptosis. CN V: Facial sensation is intact to pinprick in all 3 divisions bilaterally. Corneal responses are intact.  CN VII: Face is symmetric with normal eye closure and smile. CN VIII: Hearing is normal to rubbing fingers CN IX,  X: Palate elevates symmetrically. Phonation is normal. CN XI: Head turning and shoulder shrug are intact CN XII: Tongue is midline with normal movements and no atrophy.  MOTOR: There is no pronator drift of out-stretched arms. Muscle bulk and tone are normal. Muscle strength is normal.  REFLEXES: Reflexes are 2+ and symmetric at the biceps, triceps, knees, and ankles. Plantar responses are flexor.  SENSORY: Intact to light touch, pinprick, position sense, and vibration sense are intact in fingers and toes.  COORDINATION: Rapid alternating movements and fine finger movements are intact. There is no dysmetria on finger-to-nose and heel-knee-shin.    GAIT/STANCE: Posture is normal. Gait is steady with normal steps, base, arm swing, and turning. Heel and  toe walking are normal. Tandem gait is normal.  Romberg is absent.   DIAGNOSTIC DATA (LABS, IMAGING, TESTING) - I reviewed patient records, labs, notes, testing and imaging myself where available.   ASSESSMENT AND PLAN  CEILI BOSHERS is a 63 y.o. female   Slurred speech  I am not sure exactly etiology, potential central nervous system pathology, stroke, she does has multiple vascular risk factor, hypertension and hyperlipidemia, diabetes   Proceed with MRI of the brain  Keep aspirin daily,    Levert Feinstein, M.D. Ph.D.  [facility] [address], [address] [place], [place] [address] Ph: [phone] Fax: [phone]  CC: Collene Gobble, MD

## 2015-07-22 ENCOUNTER — Telehealth: Payer: Self-pay

## 2015-07-22 NOTE — Telephone Encounter (Signed)
Spoke to the patient and an appointment has been scheduled.

## 2015-07-22 NOTE — Telephone Encounter (Signed)
Please call pt to schedule her in the week Dr. Terrace ArabiaYan comes back from vacation as she needed a 2 week f/u and we had to cx her 07/30/2015 apt due to Terrace ArabiaYan being out of office and there is no open apt..please advise pt knows apt is cx'd and someone will be calling to schedule apt

## 2015-07-23 ENCOUNTER — Other Ambulatory Visit: Payer: Federal, State, Local not specified - PPO

## 2015-07-23 ENCOUNTER — Ambulatory Visit (INDEPENDENT_AMBULATORY_CARE_PROVIDER_SITE_OTHER): Payer: Federal, State, Local not specified - PPO

## 2015-07-23 DIAGNOSIS — R930 Abnormal findings on diagnostic imaging of skull and head, not elsewhere classified: Secondary | ICD-10-CM

## 2015-07-23 DIAGNOSIS — R479 Unspecified speech disturbances: Secondary | ICD-10-CM

## 2015-07-23 DIAGNOSIS — R9089 Other abnormal findings on diagnostic imaging of central nervous system: Secondary | ICD-10-CM

## 2015-07-24 ENCOUNTER — Encounter: Payer: Self-pay | Admitting: Neurology

## 2015-07-24 ENCOUNTER — Encounter: Payer: Self-pay | Admitting: Emergency Medicine

## 2015-07-24 ENCOUNTER — Telehealth: Payer: Self-pay | Admitting: Neurology

## 2015-07-24 ENCOUNTER — Ambulatory Visit (INDEPENDENT_AMBULATORY_CARE_PROVIDER_SITE_OTHER): Payer: Federal, State, Local not specified - PPO | Admitting: Emergency Medicine

## 2015-07-24 ENCOUNTER — Ambulatory Visit (INDEPENDENT_AMBULATORY_CARE_PROVIDER_SITE_OTHER): Payer: Federal, State, Local not specified - PPO | Admitting: Neurology

## 2015-07-24 VITALS — BP 129/80 | HR 91 | Temp 98.2°F | Resp 16 | Ht 58.75 in | Wt 153.6 lb

## 2015-07-24 VITALS — BP 147/84 | HR 112 | Ht 59.0 in | Wt 151.0 lb

## 2015-07-24 DIAGNOSIS — R4781 Slurred speech: Secondary | ICD-10-CM

## 2015-07-24 DIAGNOSIS — Z23 Encounter for immunization: Secondary | ICD-10-CM | POA: Diagnosis not present

## 2015-07-24 DIAGNOSIS — R930 Abnormal findings on diagnostic imaging of skull and head, not elsewhere classified: Secondary | ICD-10-CM | POA: Diagnosis not present

## 2015-07-24 DIAGNOSIS — E119 Type 2 diabetes mellitus without complications: Secondary | ICD-10-CM | POA: Diagnosis not present

## 2015-07-24 DIAGNOSIS — F329 Major depressive disorder, single episode, unspecified: Secondary | ICD-10-CM | POA: Diagnosis not present

## 2015-07-24 DIAGNOSIS — R9089 Other abnormal findings on diagnostic imaging of central nervous system: Secondary | ICD-10-CM

## 2015-07-24 NOTE — Progress Notes (Signed)
Subjective:  This chart was scribed for Meghan Gobble, MD by Veverly Fells, at [facility].  This patient was seen in room  23  and the patient's care was started at 10:05 AM.   Chief Complaint  Patient presents with  . Follow-up    blood pressure and depression  . Diabetes  . Medication Refill    metformin, losartan-hctz, atorvastatin and oxycodone  . Depression    answers positive      Patient ID: Meghan Hickman, female    DOB: 02-16-1951, 64 y.o.   MRN: [id number]  HPI  HPI Comments: Meghan Hickman is a 64 y.o. female who presents to the [facility] for multiple concerns today including:   Depression:  Patient feels like her depression is getting better and the medication is helping.  She now has "a little more" drive and is aware of what depresses her while still functioning daily.  Taking the depression medication with her Gabapentin helps her zone out when she wants to.  She would like to go up in her dosage of Depakote (2X/day) and states that she doesn't feel drowsy when she takes it.   Thyroid: She is wondering what to do regarding her thyroid as she was okay with having a biopsy done but never got a call for it.    Diabetes:  She states that her sugar is doing much better now and is compliant with her medication.   Vaccinations: Patient has not yet had her pneumonia vaccination (willing to have one today) or the shingles vaccination.  She is up to date with her flu vaccination.   Medication refill: Patient would like a refill on her Metformin, Losartan, Atorvastatin and Oxycodone.   She had an MRI done last night and was scheduled to see Dr. Terrace Arabia this morning but did not have the results yet.     Patient Active Problem List   Diagnosis Date Noted  . Abnormal CT of brain 07/08/2015  . Speech abnormality 06/20/2015  . Thyroid nodule 06/20/2015  . S/P laparoscopic cholecystectomy 12/05/2014  . Heart murmur 02/11/2013  .  Type II or unspecified type diabetes mellitus without mention of complication, not stated as uncontrolled 02/09/2013  . Other and unspecified hyperlipidemia 02/09/2013   Past Medical History  Diagnosis Date  . Arthritis   . Diabetes mellitus without complication (HCC)   . Hyperlipidemia   . Heart murmur   . Hypertension   . Wears glasses   . Headache   . GERD (gastroesophageal reflux disease)   . Speech problem    Past Surgical History  Procedure Laterality Date  . Shoulder arthroscopy with subacromial decompression and bicep tendon repair Right 04/23/2013    Procedure: SHOULDER ARTHROSCOPY WITH SUBACROMIAL DECOMPRESSION AND BICEP TENDON TENOTOMY;  Surgeon: Mable Paris, MD;  Location: [facility];  Service: Orthopedics;  Laterality: Right;  . Cholecystectomy N/A 12/05/2014    Procedure: LAPAROSCOPIC CHOLECYSTECTOMY;  Surgeon: Emelia Loron, MD;  Location: [facility] OR;  Service: General;  Laterality: N/A;   No Known Allergies Prior to Admission medications   Medication Sig Start Date End Date Taking? Authorizing Provider  almotriptan (AXERT) 12.5 MG tablet Take 12.5 mg by mouth daily as needed for migraine.  09/23/14   Historical Provider, MD  aspirin 81 MG tablet Take 81 mg by mouth daily.    Historical Provider, MD  atorvastatin (LIPITOR) 40 MG tablet Take 1 tablet (40 mg total) by mouth  daily at 6 PM. 04/24/15   Meghan GobbleSteven A Jathan Balling, MD  divalproex (DEPAKOTE ER) 500 MG 24 hr tablet Take 1 tablet daily for depression. 06/24/15   Meghan GobbleSteven A Tessa Seaberry, MD  gabapentin (NEURONTIN) 100 MG capsule Take 100 mg by mouth 3 (three) times daily.    Historical Provider, MD  ibuprofen (ADVIL,MOTRIN) 200 MG tablet Take 200 mg by mouth every 6 (six) hours as needed for mild pain or moderate pain.    Historical Provider, MD  losartan-hydrochlorothiazide (HYZAAR) 50-12.5 MG per tablet Take 1 tablet by mouth daily. 04/24/15 04/23/16  Meghan GobbleSteven A Remy Voiles, MD  metFORMIN (GLUCOPHAGE) 500 MG tablet Take 1  tablet (500 mg total) by mouth daily. 04/24/15   Meghan GobbleSteven A Ramsey Guadamuz, MD  Multiple Vitamins-Minerals (MULTIVITAMIN & MINERAL PO) Take 1 tablet by mouth daily.    Historical Provider, MD  oxyCODONE (OXY IR/ROXICODONE) 5 MG immediate release tablet Take 1-2 tablets (5-10 mg total) by mouth every 4 (four) hours as needed for moderate pain. 12/06/14   Emelia LoronMatthew Wakefield, MD  temazepam (RESTORIL) 30 MG capsule TAKE ONE CAPSULE AT BEDTIME AS NEEDED FOR SLEEP 09/25/14   Meghan GobbleSteven A Dawson Albers, MD  traMADol (ULTRAM) 50 MG tablet Take 1 tablet (50 mg total) by mouth every 6 (six) hours as needed. 04/24/15   Meghan GobbleSteven A Ludia Gartland, MD   Social History   Social History  . Marital Status: Single    Spouse Name: N/A  . Number of Children: 0  . Years of Education: 16   Occupational History  . Postal SolicitorClerk    Social History Main Topics  . Smoking status: Former Smoker -- 0.20 packs/day for 10 years    Types: Cigarettes    Start date: 04/30/2012    Quit date: 08/16/2012  . Smokeless tobacco: Not on file  . Alcohol Use: No  . Drug Use: No  . Sexual Activity: Not on file     Comment: was smoking 1/2 pppd   Other Topics Concern  . Not on file   Social History Narrative   Lives at home alone.   Right-handed.   2 cups caffeine per day.        Review of Systems  Constitutional: Negative for fever and chills.  Eyes: Negative for pain, redness and itching.  Respiratory: Negative for cough and shortness of breath.   Gastrointestinal: Negative for nausea and vomiting.  Musculoskeletal: Negative for neck pain and neck stiffness.       Objective:   Physical Exam  Filed Vitals:   07/24/15 0948  BP: 129/80  Pulse: 91  Temp: 98.2 F (36.8 C)  TempSrc: Oral  Resp: 16  Height: 4' 10.75" (1.492 m)  Weight: 153 lb 9.6 oz (69.673 kg)  SpO2: 94%     CONSTITUTIONAL: Well developed/well nourished HEAD: Normocephalic/atraumatic EYES: EOMI/PERRL ENMT: Mucous membranes moist NECK: supple no meningeal signs, She has  a definite enlargement right side of the thyroid.  SPINE/BACK:entire spine nontender CV: S1/S2 noted, no murmurs/rubs/gallops noted LUNGS: Lungs are clear to auscultation bilaterally, no apparent distress NEURO: Pt is awake/alert/appropriate, moves all extremitiesx4.  No facial droop.   EXTREMITIES: pulses normal/equal, full ROM SKIN: warm, color normal PSYCH: no abnormalities of mood noted, alert and oriented to situation       Assessment & Plan:  I did not go up on her Depakote because she is already at 500 mg daily. I did call interventional radiology to reschedule her biopsy of the thyroid gland. She is currently seeing the neurologist and  had a recent MRI but results are pending.I personally performed the services described in this documentation, which was scribed in my presence. The recorded information has been reviewed and is accurate.

## 2015-07-24 NOTE — Progress Notes (Signed)
Chief Complaint  Patient presents with  . Speech Abnormality    She is here to discuss her MRI results.      PATIENT: Meghan Hickman DOB: 02/23/1951  Chief Complaint  Patient presents with  . Speech Abnormality    She is here to discuss her MRI results.     HISTORICAL  Meghan Hickman is a 64 years old right-handed female, alone at today's clinical visit, seen in refer by  her primary care physician Dr. Elgie Congo in July 08 2015 for evaluation of speech difficulty  She had a history of hypertension, hyperlipidemia, diabetes, depression, was recently started on Depakote 500 mg daily  She began to noticed gradual onset slurred speech since April 2016, initially attributed to her dental work, even after it was fixed, she continue have progressive slow talking, struggle with words, intermittent slurring, she denies chewing swallowing difficulty, she denies visual loss, she work as a Hydrographic surveyor, no limb muscle weakness,  She recently found to have enlarged thyroid, needle biopsy is planned, ultrasound of carotid artery was patent,  We have Reviewed CAT scan in October 2016, periventricular white matter disease, no acute lesions  UPDATE Jul 24 2015: She still struggle with words, mild slur,word finding difficulty, mild confusion. MRI of the brain reviewed, mild small vessel disease, more on left hemisphere  She has no acute lesions  REVIEW OF SYSTEMS: Full 14 system review of systems performed and notable only for headaches, numbness, slurred speech, insomnia, sleepiness, depression ALLERGIES: No Known Allergies  HOME MEDICATIONS: Current Outpatient Prescriptions  Medication Sig Dispense Refill  . almotriptan (AXERT) 12.5 MG tablet Take 12.5 mg by mouth daily as needed for migraine.   4  . aspirin 81 MG tablet Take 81 mg by mouth daily.    [name] Kitchen atorvastatin (LIPITOR) 40 MG tablet Take 1 tablet (40 mg total) by mouth daily at 6 PM. 90 tablet 3  . divalproex (DEPAKOTE  ER) 500 MG 24 hr tablet Take 1 tablet daily for depression. 30 tablet 11  . gabapentin (NEURONTIN) 100 MG capsule Take 100 mg by mouth 3 (three) times daily.    [name] Kitchen ibuprofen (ADVIL,MOTRIN) 200 MG tablet Take 200 mg by mouth every 6 (six) hours as needed for mild pain or moderate pain.    [name] Kitchen losartan-hydrochlorothiazide (HYZAAR) 50-12.5 MG per tablet Take 1 tablet by mouth daily. 90 tablet 3  . metFORMIN (GLUCOPHAGE) 500 MG tablet Take 1 tablet (500 mg total) by mouth daily. 90 tablet 3  . Multiple Vitamins-Minerals (MULTIVITAMIN & MINERAL PO) Take 1 tablet by mouth daily.    [name] Kitchen oxyCODONE (OXY IR/ROXICODONE) 5 MG immediate release tablet Take 1-2 tablets (5-10 mg total) by mouth every 4 (four) hours as needed for moderate pain. 25 tablet 0  . temazepam (RESTORIL) 30 MG capsule TAKE ONE CAPSULE AT BEDTIME AS NEEDED FOR SLEEP 30 capsule 2  . traMADol (ULTRAM) 50 MG tablet Take 1 tablet (50 mg total) by mouth every 6 (six) hours as needed. 30 tablet 2   No current facility-administered medications for this visit.    PAST MEDICAL HISTORY: Past Medical History  Diagnosis Date  . Arthritis   . Diabetes mellitus without complication (HCC)   . Hyperlipidemia   . Heart murmur   . Hypertension   . Wears glasses   . Headache   . GERD (gastroesophageal reflux disease)   . Speech problem     PAST SURGICAL HISTORY: Past Surgical History  Procedure Laterality Date  .  Shoulder arthroscopy with subacromial decompression and bicep tendon repair Right 04/23/2013    Procedure: SHOULDER ARTHROSCOPY WITH SUBACROMIAL DECOMPRESSION AND BICEP TENDON TENOTOMY;  Surgeon: Mable Paris, MD;  Location: [facility];  Service: Orthopedics;  Laterality: Right;  . Cholecystectomy N/A 12/05/2014    Procedure: LAPAROSCOPIC CHOLECYSTECTOMY;  Surgeon: Emelia Loron, MD;  Location: [facility] OR;  Service: General;  Laterality: N/A;    FAMILY HISTORY: Family History  Problem Relation Age of Onset  .  Stroke Mother   . Hypertension Mother   . Diabetes Mother   . Breast cancer Mother   . Heart disease Brother   . Healthy Father     SOCIAL HISTORY:  Social History   Social History  . Marital Status: Single    Spouse Name: N/A  . Number of Children: 0  . Years of Education: 16   Occupational History  . Postal Solicitor    Social History Main Topics  . Smoking status: Former Smoker -- 0.20 packs/day for 10 years    Types: Cigarettes    Start date: 04/30/2012    Quit date: 08/16/2012  . Smokeless tobacco: Not on file  . Alcohol Use: No  . Drug Use: No  . Sexual Activity: Not on file     Comment: was smoking 1/2 pppd   Other Topics Concern  . Not on file   Social History Narrative   Lives at home alone.   Right-handed.   2 cups caffeine per day.     PHYSICAL EXAM   Filed Vitals:   07/24/15 0736  Height:  (1.499 m)  Weight: 151 lb (68.493 kg)    Not recorded      Body mass index is 30.48 kg/(m^2).  PHYSICAL EXAMNIATION:  Gen: NAD, conversant, well nourised, obese, well groomed                     Cardiovascular: Regular rate rhythm, no peripheral edema, warm, nontender. Eyes: Conjunctivae clear without exudates or hemorrhage Neck: Supple, no carotid bruise. Pulmonary: Clear to auscultation bilaterally   NEUROLOGICAL EXAM:  MENTAL STATUS: Speech:  Mild hesitation in her speech, mild slurred, but variable degree, struggle with the world, normal comprehension, Cognition:     Orientation to time, place and person     Normal recent and remote memory     Normal Attention span and concentration     Normal Language, naming, repeating,spontaneous speech     Fund of knowledge   CRANIAL NERVES: CN II: Visual fields are full to confrontation. Fundoscopic exam is normal with sharp discs and no vascular changes. Pupils are round equal and briskly reactive to light. CN III, IV, VI: extraocular movement are normal. No ptosis. CN V: Facial sensation is  intact to pinprick in all 3 divisions bilaterally. Corneal responses are intact.  CN VII: Face is symmetric with normal eye closure and smile. CN VIII: Hearing is normal to rubbing fingers CN IX, X: Palate elevates symmetrically. Phonation is normal. CN XI: Head turning and shoulder shrug are intact CN XII: Tongue is midline with normal movements and no atrophy.  MOTOR: There is no pronator drift of out-stretched arms. Muscle bulk and tone are normal. Muscle strength is normal.  REFLEXES: Reflexes are 2+ and symmetric at the biceps, triceps, knees, and ankles. Plantar responses are flexor.  SENSORY: Intact to light touch, pinprick, position sense, and vibration sense are intact in fingers and toes.  COORDINATION: Rapid alternating movements and fine finger  movements are intact. There is no dysmetria on finger-to-nose and heel-knee-shin.    GAIT/STANCE: Posture is normal. Gait is steady with normal steps, base, arm swing, and turning. Heel and toe walking are normal. Tandem gait is normal.  Romberg is absent.   DIAGNOSTIC DATA (LABS, IMAGING, TESTING) - I reviewed patient records, labs, notes, testing and imaging myself where available.   ASSESSMENT AND PLAN  Michelene GardenerLinda M Caniglia is a 64 y.o. female   Slurred speech  MRI of the brain only showed mild small vessel disease, more at the left subcortical region, this will not explain her complaints of slurred speech  Continue to observe her symptoms, return to clinic in 2-3 months,    Levert FeinsteinYijun Davette Nugent, M.D. Ph.D.  [facility] [address], [address] [place], [place] [address] Ph: [phone] Fax: [phone]  CC: Collene GobbleSteven A Daub, MD

## 2015-07-24 NOTE — Telephone Encounter (Signed)
Spoke to patient - aware of results. 

## 2015-07-24 NOTE — Telephone Encounter (Signed)
Please call patient,  MRI showed some age related changes, there was no acute abnormality  Mildly abnormal MRI brain (without) demonstrating: 1. 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  2. No acute findings.

## 2015-07-24 NOTE — Patient Instructions (Signed)
You have been [facility] for Ultrasound Thyroid biopsy. You will receive a call from Lyla Sonarrie, per Elease HashimotoAlisha at [facility] regarding your appointment time. If you have not heard from them by Friday, Dec. 16, 2016, please call them at [phone].

## 2015-07-30 ENCOUNTER — Ambulatory Visit: Payer: Federal, State, Local not specified - PPO | Admitting: Neurology

## 2015-08-05 ENCOUNTER — Ambulatory Visit (HOSPITAL_COMMUNITY)
Admission: RE | Admit: 2015-08-05 | Discharge: 2015-08-05 | Disposition: A | Discharge status: Home / Self Care | Payer: Federal, State, Local not specified - PPO | Source: Ambulatory Visit | Attending: Emergency Medicine | Admitting: Emergency Medicine

## 2015-08-05 DIAGNOSIS — E041 Nontoxic single thyroid nodule: Secondary | ICD-10-CM | POA: Diagnosis not present

## 2015-08-05 MED ORDER — LIDOCAINE HCL (PF) 1 % IJ SOLN
INTRAMUSCULAR | Status: AC
  Filled 2015-08-05: qty 10

## 2015-09-02 ENCOUNTER — Other Ambulatory Visit: Payer: Self-pay | Admitting: Emergency Medicine

## 2015-09-30 ENCOUNTER — Ambulatory Visit (INDEPENDENT_AMBULATORY_CARE_PROVIDER_SITE_OTHER): Payer: Federal, State, Local not specified - PPO

## 2015-09-30 ENCOUNTER — Ambulatory Visit (INDEPENDENT_AMBULATORY_CARE_PROVIDER_SITE_OTHER): Payer: Federal, State, Local not specified - PPO | Admitting: Family Medicine

## 2015-09-30 VITALS — BP 124/80 | HR 105 | Temp 98.5°F | Resp 20 | Ht <= 58 in | Wt 149.8 lb

## 2015-09-30 DIAGNOSIS — M25511 Pain in right shoulder: Secondary | ICD-10-CM

## 2015-09-30 DIAGNOSIS — M542 Cervicalgia: Secondary | ICD-10-CM

## 2015-09-30 DIAGNOSIS — M792 Neuralgia and neuritis, unspecified: Secondary | ICD-10-CM

## 2015-09-30 DIAGNOSIS — M6248 Contracture of muscle, other site: Secondary | ICD-10-CM | POA: Diagnosis not present

## 2015-09-30 DIAGNOSIS — M62838 Other muscle spasm: Secondary | ICD-10-CM

## 2015-09-30 DIAGNOSIS — S43421D Sprain of right rotator cuff capsule, subsequent encounter: Secondary | ICD-10-CM | POA: Diagnosis not present

## 2015-09-30 MED ORDER — CYCLOBENZAPRINE HCL 5 MG PO TABS: 5.0000 mg | ORAL_TABLET | Freq: Two times a day (BID) | ORAL | Status: DC | PRN

## 2015-09-30 MED ORDER — HYDROCODONE-ACETAMINOPHEN 10-325 MG PO TABS: 1.0000 | ORAL_TABLET | Freq: Three times a day (TID) | ORAL | Status: DC | PRN

## 2015-09-30 MED ORDER — METHYLPREDNISOLONE 4 MG PO TBPK: ORAL_TABLET | ORAL | Status: DC

## 2015-09-30 NOTE — Progress Notes (Signed)
 Chief Complaint:  Chief Complaint  Patient presents with  . Neck Pain    x 1 week    HPI: Meghan Hickman is a 65 y.o. female who reports to [facility] today complaining of 1-2  Week history of right sided neck pain, the neck pain started last night , she had numbness and tingling down her right neck which radiated down to lateral  fingers. Nothing on left side, no CP or SOB, no CVA like sxs. Standing up makes her neck worse. Flexion of neck is not as bad as going backwards, Has limited ROM on right side and moderate to severe pain. It is a sharp pain when she moves, dull aching pain when she is sitting still. The radiating pain from neck goes down her arm and radiates to all her fogners. The whole arma hurts. She ash a right shoulder injury as well  2014, [facility] and has arthroscopy with decompression and biceps tendon tenotomy with Dr Tamera Punt . State her pain is 5/10  When at rest, 8/10 when have to move it. She took tramadol without relief. She has arthritis, Denies any recent injuries. No new activities.Has arthritis, denies that statins make her msk aches   Past Medical History  Diagnosis Date  . Arthritis   . Diabetes mellitus without complication ([place])   . Hyperlipidemia   . Heart murmur   . Hypertension   . Wears glasses   . Headache   . GERD (gastroesophageal reflux disease)   . Speech problem    Past Surgical History  Procedure Laterality Date  . Shoulder arthroscopy with subacromial decompression and bicep tendon repair Right 04/23/2013    Procedure: SHOULDER ARTHROSCOPY WITH SUBACROMIAL DECOMPRESSION AND BICEP TENDON TENOTOMY;  Surgeon: Nita Sells, MD;  Location: [facility];  Service: Orthopedics;  Laterality: Right;  . Cholecystectomy N/A 12/05/2014    Procedure: LAPAROSCOPIC CHOLECYSTECTOMY;  Surgeon: Rolm Bookbinder, MD;  Location: [place];  Service: General;  Laterality: N/A;   Social History   Social History  . Marital Status:  Single    Spouse Name: N/A  . Number of Children: 0  . Years of Education: 16   Occupational History  . Postal Scientist, clinical (histocompatibility and immunogenetics)    Social History Main Topics  . Smoking status: Former Smoker -- 0.20 packs/day for 10 years    Types: Cigarettes    Start date: 04/30/2012    Quit date: 08/16/2012  . Smokeless tobacco: None  . Alcohol Use: No  . Drug Use: No  . Sexual Activity: Not Asked     Comment: was smoking 1/2 pppd   Other Topics Concern  . None   Social History Narrative   Lives at home alone.   Right-handed.   2 cups caffeine per day.   Family History  Problem Relation Age of Onset  . Stroke Mother   . Hypertension Mother   . Diabetes Mother   . Breast cancer Mother   . Heart disease Brother   . Healthy Father    No Known Allergies Prior to Admission medications   Medication Sig Start Date End Date Taking? Authorizing Provider  almotriptan (AXERT) 12.5 MG tablet Take 12.5 mg by mouth daily as needed for migraine.  09/23/14  Yes Historical Provider, MD  aspirin 81 MG tablet Take 81 mg by mouth daily.   Yes Historical Provider, MD  atorvastatin (LIPITOR) 40 MG tablet Take 1 tablet (40 mg total) by mouth daily at 6 PM. 04/24/15  Yes Darlyne Russian, MD  divalproex (DEPAKOTE ER) 500 MG 24 hr tablet Take 1 tablet daily for depression. 06/24/15  Yes Darlyne Russian, MD  gabapentin (NEURONTIN) 100 MG capsule Take 100 mg by mouth 3 (three) times daily.   Yes Historical Provider, MD  ibuprofen (ADVIL,MOTRIN) 200 MG tablet Take 200 mg by mouth every 6 (six) hours as needed for mild pain or moderate pain.   Yes Historical Provider, MD  losartan-hydrochlorothiazide (HYZAAR) 50-12.5 MG per tablet Take 1 tablet by mouth daily. 04/24/15 04/23/16 Yes Darlyne Russian, MD  metFORMIN (GLUCOPHAGE) 500 MG tablet Take 1 tablet (500 mg total) by mouth daily. 04/24/15  Yes Darlyne Russian, MD  temazepam (RESTORIL) 30 MG capsule TAKE 1 CAPSULE BY MOUTH AT BEDTIME AS NEEDED FOR SLEEP 09/02/15  Yes Darlyne Russian, MD    traMADol (ULTRAM) 50 MG tablet Take 1 tablet (50 mg total) by mouth every 6 (six) hours as needed. Patient taking differently: Take 50 mg by mouth every 6 (six) hours as needed for moderate pain.  04/24/15  Yes Darlyne Russian, MD  Multiple Vitamins-Minerals (MULTIVITAMIN & MINERAL PO) Take 1 tablet by mouth daily.    Historical Provider, MD  oxyCODONE (OXY IR/ROXICODONE) 5 MG immediate release tablet Take 1-2 tablets (5-10 mg total) by mouth every 4 (four) hours as needed for moderate pain. Patient not taking: Reported on 07/30/2015 12/06/14   Rolm Bookbinder, MD     ROS: The patient denies fevers, chills, night sweats, unintentional weight loss, chest pain, palpitations, wheezing, dyspnea on exertion, nausea, vomiting, abdominal pain, dysuria, hematuria, melena, +numbness, + tingling.   All other systems have been reviewed and were otherwise negative with the exception of those mentioned in the HPI and as above.    PHYSICAL EXAM: Filed Vitals:   09/30/15 1336  BP: 124/80  Pulse: 105  Temp: 98.5 F (36.9 C)  Resp: 20   Body mass index is 31.32 kg/(m^2).   General: Alert, no acute distress HEENT:  Normocephalic, atraumatic, oropharynx patent. EOMI, PERRLA Cardiovascular:  Regular rate and rhythm, no rubs murmurs or gallops.  No Carotid bruits, radial pulse intact. No pedal edema.  Respiratory: Clear to auscultation bilaterally.  No wheezes, rales, or rhonchi.  No cyanosis, no use of accessory musculature Abdominal: No organomegaly, abdomen is soft and non-tender, positive bowel sounds. No masses. Skin: No rashes. Neurologic: Facial musculature symmetric. Psychiatric: Patient acts appropriately throughout our interaction. Lymphatic: No cervical or submandibular lymphadenopathy Musculoskeletal: Gait intact. No edema, tenderness Neck exam normal-neg spurling Tender trapezius right side  Shoulder No hypertrophy/atrophy, no erythema, no fluid, no wounds Decrease  ROM Nontender at [facility]  jt Neg Empty Can test, neg Lift off test, neg Speeds,  +/- Neg Hawkins/Neers 5/5 strength, 2/2 triceps and biceps DTRs  LABS: Results for orders placed or performed in visit on 04/24/15  CBC with Differential/Platelet  Result Value Ref Range   WBC 8.7 4.0 - 10.5 K/uL   RBC 4.70 3.87 - 5.11 MIL/uL   Hemoglobin 12.9 12.0 - 15.0 g/dL   HCT 38.5 36.0 - 46.0 %   MCV 81.9 78.0 - 100.0 fL   MCH 27.4 26.0 - 34.0 pg   MCHC 33.5 30.0 - 36.0 g/dL   RDW 14.0 11.5 - 15.5 %   Platelets 379 150 - 400 K/uL   MPV 9.6 8.6 - 12.4 fL   Neutrophils Relative % 54 43 - 77 %   Neutro Abs 4.7 1.7 - 7.7 K/uL  Lymphocytes Relative 37 12 - 46 %   Lymphs Abs 3.2 0.7 - 4.0 K/uL   Monocytes Relative 6 3 - 12 %   Monocytes Absolute 0.5 0.1 - 1.0 K/uL   Eosinophils Relative 2 0 - 5 %   Eosinophils Absolute 0.2 0.0 - 0.7 K/uL   Basophils Relative 1 0 - 1 %   Basophils Absolute 0.1 0.0 - 0.1 K/uL   Smear Review Criteria for review not met   Microalbumin, urine  Result Value Ref Range   Microalb, Ur 0.9 <2.0 mg/dL  Lipid panel  Result Value Ref Range   Cholesterol 183 125 - 200 mg/dL   Triglycerides 85 <150 mg/dL   HDL 55 >=46 mg/dL   Total CHOL/HDL Ratio 3.3 <=5.0 Ratio   VLDL 17 <30 mg/dL   LDL Cholesterol 111 <130 mg/dL  Hepatitis C antibody  Result Value Ref Range   HCV Ab NEGATIVE NEGATIVE  HIV antibody  Result Value Ref Range   HIV 1&2 Ab, 4th Generation NONREACTIVE NONREACTIVE  COMPLETE METABOLIC PANEL WITH GFR  Result Value Ref Range   Sodium 141 135 - 146 mmol/L   Potassium 3.8 3.5 - 5.3 mmol/L   Chloride 104 98 - 110 mmol/L   CO2 29 20 - 31 mmol/L   Glucose, Bld 92 65 - 99 mg/dL   BUN 13 7 - 25 mg/dL   Creat 0.86 0.50 - 0.99 mg/dL   Total Bilirubin 0.9 0.2 - 1.2 mg/dL   Alkaline Phosphatase 85 33 - 130 U/L   AST 19 10 - 35 U/L   ALT 16 6 - 29 U/L   Total Protein 7.1 6.1 - 8.1 g/dL   Albumin 4.3 3.6 - 5.1 g/dL   Calcium 9.6 8.6 - 10.4 mg/dL   GFR, Est African American 82 >=60  mL/min   GFR, Est Non African American 71 >=60 mL/min  POCT glucose (manual entry)  Result Value Ref Range   POC Glucose 106 (A) 70 - 99 mg/dl  POCT glycosylated hemoglobin (Hb A1C)  Result Value Ref Range   Hemoglobin A1C 5.8   POCT urinalysis dipstick  Result Value Ref Range   Color, UA yellow    Clarity, UA clear    Glucose, UA neg    Bilirubin, UA neg    Ketones, UA neg    Spec Grav, UA >=1.030    Blood, UA neg    pH, UA 5.0    Protein, UA neg    Urobilinogen, UA 0.2    Nitrite, UA neg    Leukocytes, UA Negative Negative     EKG/XRAY:   IMPRESSION: 1. 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 2. Frontal projection suggest prior Mumford procedure. 3. Advanced degenerative glenohumeral arthropathy.   Electronically Signed  By: Van Clines M.D.  On: 09/30/2015 15:28  IMPRESSION: Straightened alignment with diffuse degenerative disc disease from C3-C7. Probable mild foraminal narrowing at C5-6 and C6-7.   Electronically Signed  By: Ivar Drape M.D.  On: 09/30/2015 15:29   ASSESSMENT/PLAN: Encounter Diagnoses  Name Primary?  . Right shoulder pain   . Neck pain Yes  . Radicular pain in right arm   . Sprain of right rotator cuff capsule, subsequent encounter   . Muscle spasms of head or neck    Shoulder and c spine xrays show DJD and also c spine straightened alignment Rx medrol dose pack, flexeril prn and also norco prn  Fu prn   Gross sideeffects, risk and benefits, and  alternatives of medications d/w patient. Patient is aware that all medications have potential sideeffects and we are unable to predict every sideeffect or drug-drug interaction that may occur.    DO  10/17/2015 12:20 PM

## 2015-09-30 NOTE — Patient Instructions (Signed)
Because you received an x-ray today, you will receive an invoice from [facility]. Please contact [facility] at [phone] with questions or concerns regarding your invoice. Our billing staff will not be able to assist you with those questions. °

## 2015-10-28 ENCOUNTER — Ambulatory Visit: Payer: Federal, State, Local not specified - PPO | Admitting: Emergency Medicine

## 2015-10-30 ENCOUNTER — Encounter: Payer: Self-pay | Admitting: Emergency Medicine

## 2015-10-30 ENCOUNTER — Ambulatory Visit (INDEPENDENT_AMBULATORY_CARE_PROVIDER_SITE_OTHER): Payer: Federal, State, Local not specified - PPO | Admitting: Emergency Medicine

## 2015-10-30 VITALS — BP 118/60 | HR 103 | Temp 97.9°F | Resp 16

## 2015-10-30 DIAGNOSIS — R197 Diarrhea, unspecified: Secondary | ICD-10-CM

## 2015-10-30 DIAGNOSIS — I10 Essential (primary) hypertension: Secondary | ICD-10-CM

## 2015-10-30 DIAGNOSIS — M62838 Other muscle spasm: Secondary | ICD-10-CM

## 2015-10-30 DIAGNOSIS — M542 Cervicalgia: Secondary | ICD-10-CM | POA: Diagnosis not present

## 2015-10-30 DIAGNOSIS — M25511 Pain in right shoulder: Secondary | ICD-10-CM

## 2015-10-30 DIAGNOSIS — E785 Hyperlipidemia, unspecified: Secondary | ICD-10-CM | POA: Diagnosis not present

## 2015-10-30 DIAGNOSIS — M6248 Contracture of muscle, other site: Secondary | ICD-10-CM | POA: Diagnosis not present

## 2015-10-30 DIAGNOSIS — F329 Major depressive disorder, single episode, unspecified: Secondary | ICD-10-CM

## 2015-10-30 DIAGNOSIS — R739 Hyperglycemia, unspecified: Secondary | ICD-10-CM | POA: Diagnosis not present

## 2015-10-30 DIAGNOSIS — E041 Nontoxic single thyroid nodule: Secondary | ICD-10-CM | POA: Diagnosis not present

## 2015-10-30 DIAGNOSIS — S43421D Sprain of right rotator cuff capsule, subsequent encounter: Secondary | ICD-10-CM | POA: Diagnosis not present

## 2015-10-30 DIAGNOSIS — M792 Neuralgia and neuritis, unspecified: Secondary | ICD-10-CM

## 2015-10-30 DIAGNOSIS — F32A Depression, unspecified: Secondary | ICD-10-CM

## 2015-10-30 LAB — THYROID PANEL WITH TSH
Free Thyroxine Index: 2.4 (ref 1.4–3.8)
T3 Uptake: 32 % (ref 22–35)
T4, Total: 7.4 ug/dL (ref 4.5–12.0)
TSH: 2.78 mIU/L

## 2015-10-30 LAB — BASIC METABOLIC PANEL WITH GFR
BUN: 10 mg/dL (ref 7–25)
CHLORIDE: 108 mmol/L (ref 98–110)
CO2: 25 mmol/L (ref 20–31)
Calcium: 9.1 mg/dL (ref 8.6–10.4)
Creat: 0.73 mg/dL (ref 0.50–0.99)
GFR, EST NON AFRICAN AMERICAN: 87 mL/min (ref >=60)
GFR, Est African American: >89 mL/min (ref >=60)
GLUCOSE: 118 mg/dL — AB (ref 65–99)
POTASSIUM: 4 mmol/L (ref 3.5–5.3)
Sodium: 144 mmol/L (ref 135–146)

## 2015-10-30 LAB — CBC WITH DIFFERENTIAL/PLATELET
BASOS PCT: 0 % (ref 0–1)
Basophils Absolute: 0 10*3/uL (ref 0.0–0.1)
EOS ABS: 0.1 10*3/uL (ref 0.0–0.7)
Eosinophils Relative: 2 % (ref 0–5)
HCT: 38.4 % (ref 36.0–46.0)
Hemoglobin: 12.3 g/dL (ref 12.0–15.0)
Lymphocytes Relative: 37 % (ref 12–46)
Lymphs Abs: 2.5 10*3/uL (ref 0.7–4.0)
MCH: 26.6 pg (ref 26.0–34.0)
MCHC: 32 g/dL (ref 30.0–36.0)
MCV: 82.9 fL (ref 78.0–100.0)
MONO ABS: 0.4 10*3/uL (ref 0.1–1.0)
MONOS PCT: 6 % (ref 3–12)
MPV: 10.5 fL (ref 8.6–12.4)
NEUTROS PCT: 55 % (ref 43–77)
Neutro Abs: 3.7 10*3/uL (ref 1.7–7.7)
PLATELETS: 376 10*3/uL (ref 150–400)
RBC: 4.63 MIL/uL (ref 3.87–5.11)
RDW: 13.9 % (ref 11.5–15.5)
WBC: 6.8 10*3/uL (ref 4.0–10.5)

## 2015-10-30 LAB — LIPID PANEL
CHOL/HDL RATIO: 3.4 ratio (ref <=5.0)
Cholesterol: 147 mg/dL (ref 125–200)
HDL: 43 mg/dL — AB (ref >=46)
LDL CALC: 84 mg/dL (ref <130)
Triglycerides: 98 mg/dL (ref <150)
VLDL: 20 mg/dL (ref <30)

## 2015-10-30 MED ORDER — CHOLESTYRAMINE 4 GM/DOSE PO POWD: ORAL | Status: DC

## 2015-10-30 MED ORDER — HYDROCODONE-ACETAMINOPHEN 10-325 MG PO TABS: 1.0000 | ORAL_TABLET | Freq: Three times a day (TID) | ORAL | Status: DC | PRN

## 2015-10-30 NOTE — Patient Instructions (Signed)
     IF you received an x-ray today, you will receive an invoice from [facility]. Please contact [facility] at [phone] with questions or concerns regarding your invoice.   IF you received labwork today, you will receive an invoice from [facility]. Please contact [facility] at [phone] with questions or concerns regarding your invoice.   Our billing staff will not be able to assist you with questions regarding bills from these companies.  You will be contacted with the lab results as soon as they are available. The fastest way to get your results is to activate your My Chart account. Instructions are located on the last page of this paperwork. If you have not heard from us regarding the results in 2 weeks, please contact this office.      

## 2015-10-30 NOTE — Progress Notes (Signed)
Patient ID: Meghan Hickman, female   DOB: [birth year], 65 y.o.   MRN: [id number]     By signing my name below, I, Littie Deeds, attest that this documentation has been prepared under the direction and in the presence of Lesle Chris, MD.  Electronically Signed: Littie Deeds, Medical Scribe. 10/30/2015. 12:27 PM.   Chief Complaint:  Chief Complaint  Patient presents with  . Hyperlipidemia    follow up  . Diabetes    follow up    HPI: Meghan Hickman is a 65 y.o. female with a history of DM and HLD who reports to [facility] today for a follow-up. Her last A1c was 5.8.  She had an episode of slurred speech and saw a neurologist. They did not recommend anything, and she was told to follow-up.  Patient recently saw Dr. Conley Rolls last month for neck pain. She was told she had arthritis and was put on prednisone, hydrocodone, and a muscle relaxant. She notes the pain has been improving. Patient is requesting a refill for the pain medication, which she currently takes once a day. She is not taking the muscle relaxant as much. She has felt sedated with the muscle relaxant, but not with the pain medication.  Patient also states her diverticulosis has flared up again. She states her problems have become worse, with loose stools, gas and indigestion, since her cholecystectomy in April last year.  Patient would also like to go through counseling to help her deal with her mother's death 4 years ago. She has not had counseling before.  Patient did have US imaging and biopsy for a right thyroid nodule. She had the US done on 06/26/15. The biopsy, done on 08/05/15, was consistent with benign follicular nodule.  Past Medical History  Diagnosis Date  . Arthritis   . Diabetes mellitus without complication ([facility])   . Hyperlipidemia   . Heart murmur   . Hypertension   . Wears glasses   . Headache   . GERD (gastroesophageal reflux disease)   . Speech problem    Past Surgical History  Procedure Laterality Date  .  Shoulder arthroscopy with subacromial decompression and bicep tendon repair Right 04/23/2013    Procedure: SHOULDER ARTHROSCOPY WITH SUBACROMIAL DECOMPRESSION AND BICEP TENDON TENOTOMY;  Surgeon: Mable Paris, MD;  Location: [facility];  Service: Orthopedics;  Laterality: Right;  . Cholecystectomy N/A 12/05/2014    Procedure: LAPAROSCOPIC CHOLECYSTECTOMY;  Surgeon: Emelia Loron, MD;  Location: [facility];  Service: General;  Laterality: N/A;   Social History   Social History  . Marital Status: Single    Spouse Name: N/A  . Number of Children: 0  . Years of Education: 16   Occupational History  . Postal Solicitor    Social History Main Topics  . Smoking status: Former Smoker -- 0.20 packs/day for 10 years    Types: Cigarettes    Start date: 04/30/2012    Quit date: 08/16/2012  . Smokeless tobacco: None  . Alcohol Use: No  . Drug Use: No  . Sexual Activity: Not Asked     Comment: was smoking 1/2 pppd   Other Topics Concern  . None   Social History Narrative   Lives at home alone.   Right-handed.   2 cups caffeine per day.   Family History  Problem Relation Age of Onset  . Stroke Mother   . Hypertension Mother   . Diabetes Mother   . Breast cancer Mother   . Heart disease  Brother   . Healthy Father    Not on File Prior to Admission medications   Medication Sig Start Date End Date Taking? Authorizing Provider  almotriptan (AXERT) 12.5 MG tablet Take 12.5 mg by mouth daily as needed for migraine.  09/23/14  Yes Historical Provider, MD  aspirin 81 MG tablet Take 81 mg by mouth daily.   Yes Historical Provider, MD  atorvastatin (LIPITOR) 40 MG tablet Take 1 tablet (40 mg total) by mouth daily at 6 PM. 04/24/15  Yes Collene Gobble, MD  cyclobenzaprine (FLEXERIL) 5 MG tablet Take 1 tablet (5 mg total) by mouth 2 (two) times daily as needed. 09/30/15  Yes Thao P Le, DO  divalproex (DEPAKOTE ER) 500 MG 24 hr tablet Take 1 tablet daily for depression. 06/24/15   Yes Collene Gobble, MD  gabapentin (NEURONTIN) 100 MG capsule Take 100 mg by mouth 3 (three) times daily.   Yes Historical Provider, MD  HYDROcodone-acetaminophen (NORCO) 10-325 MG tablet Take 1 tablet by mouth every 8 (eight) hours as needed. Stop tramadol, take this with stool softener, no extra tylenol with this 09/30/15  Yes Thao P Le, DO  ibuprofen (ADVIL,MOTRIN) 200 MG tablet Take 200 mg by mouth every 6 (six) hours as needed for mild pain or moderate pain.   Yes Historical Provider, MD  losartan-hydrochlorothiazide (HYZAAR) 50-12.5 MG per tablet Take 1 tablet by mouth daily. 04/24/15 04/23/16 Yes Collene Gobble, MD  metFORMIN (GLUCOPHAGE) 500 MG tablet Take 1 tablet (500 mg total) by mouth daily. 04/24/15  Yes Collene Gobble, MD  Multiple Vitamins-Minerals (MULTIVITAMIN & MINERAL PO) Take 1 tablet by mouth daily.   Yes Historical Provider, MD  temazepam (RESTORIL) 30 MG capsule TAKE 1 CAPSULE BY MOUTH AT BEDTIME AS NEEDED FOR SLEEP 09/02/15  Yes Collene Gobble, MD  traMADol (ULTRAM) 50 MG tablet Take 1 tablet (50 mg total) by mouth every 6 (six) hours as needed. Patient taking differently: Take 50 mg by mouth every 6 (six) hours as needed for moderate pain.  04/24/15  Yes Collene Gobble, MD     ROS: The patient denies fevers, chills, night sweats, unintentional weight loss, chest pain, palpitations, wheezing, dyspnea on exertion, dysuria, hematuria, melena, numbness, weakness, or tingling.   All other systems have been reviewed and were otherwise negative with the exception of those mentioned in the HPI and as above.    PHYSICAL EXAM: Filed Vitals:   10/30/15 1158  BP: 118/60  Pulse: 103  Temp: 97.9 F (36.6 C)  Resp: 16   There is no weight on file to calculate BMI.   General: Alert, no acute distress HEENT:  Normocephalic, atraumatic, oropharynx patent. Neck: 1.5 cm right thyroid nodule. Eye: Nonie Hoyer [facility] Cardiovascular:  Regular rate and rhythm, no rubs murmurs or gallops.  No  Carotid bruits, radial pulse intact. No pedal edema.  Respiratory: Clear to auscultation bilaterally.  No wheezes, rales, or rhonchi.  No cyanosis, no use of accessory musculature Abdominal: No organomegaly, abdomen is soft and non-tender, positive bowel sounds.  No masses. Musculoskeletal: Gait intact. No edema, tenderness Skin: No rashes. Neurologic: Facial musculature symmetric. Psychiatric: Patient acts appropriately throughout our interaction. Lymphatic: No cervical or submandibular lymphadenopathy    LABS:    EKG/XRAY:   Primary read interpreted by Dr. Cleta Alberts at [facility].   ASSESSMENT/PLAN:  Right thyroid nodule has been biopsied and this was a benign follicular nodule and she was advised of need for repeat ultrasound in one year. She  is referred to hospice for counseling regarding the loss of her mother 4 years ago. We'll try Questran for her loose stools which have worsened since removal of her gallbladder. Her pain medications were refilled takes for her neck and takes one a day.I personally performed the services described in this documentation, which was scribed in my presence. The recorded information has been reviewed and is accurate. Hemoglobin A1c CBC thyroid panel were done. Gross sideeffects, risk and benefits, and alternatives of medications d/w patient. Patient is aware that all medications have potential sideeffects and we are unable to predict every sideeffect or drug-drug interaction that may occur.  Lesle ChrisSteven Gotti Alwin MD 10/30/2015 12:27 PM

## 2015-10-31 LAB — HEMOGLOBIN A1C
HEMOGLOBIN A1C: 6.5 % — AB (ref <5.7)
MEAN PLASMA GLUCOSE: 140 mg/dL — AB (ref <117)

## 2015-11-05 ENCOUNTER — Telehealth: Payer: Self-pay

## 2015-11-05 NOTE — Telephone Encounter (Signed)
Attempted to call patient to give information regarding her referral for counseling.  When someone answered the call, it was just static; I could not hear anyone on the line.  The patient can contact [facility] at [phone], no referral needed.

## 2015-11-30 ENCOUNTER — Emergency Department (HOSPITAL_COMMUNITY): Payer: Federal, State, Local not specified - PPO

## 2015-11-30 ENCOUNTER — Telehealth: Payer: Self-pay | Admitting: Emergency Medicine

## 2015-11-30 ENCOUNTER — Emergency Department (HOSPITAL_COMMUNITY)
Admission: EM | Admit: 2015-11-30 | Discharge: 2015-11-30 | Disposition: A | Discharge status: Home / Self Care | Payer: Federal, State, Local not specified - PPO | Attending: Emergency Medicine | Admitting: Emergency Medicine

## 2015-11-30 ENCOUNTER — Ambulatory Visit (INDEPENDENT_AMBULATORY_CARE_PROVIDER_SITE_OTHER): Payer: Federal, State, Local not specified - PPO | Admitting: Emergency Medicine

## 2015-11-30 ENCOUNTER — Ambulatory Visit (INDEPENDENT_AMBULATORY_CARE_PROVIDER_SITE_OTHER): Payer: Federal, State, Local not specified - PPO

## 2015-11-30 ENCOUNTER — Encounter (HOSPITAL_COMMUNITY): Payer: Self-pay | Admitting: Emergency Medicine

## 2015-11-30 VITALS — BP 128/75 | HR 108 | Temp 98.1°F | Resp 16 | Ht 59.0 in | Wt 143.0 lb

## 2015-11-30 DIAGNOSIS — Z7982 Long term (current) use of aspirin: Secondary | ICD-10-CM | POA: Diagnosis not present

## 2015-11-30 DIAGNOSIS — E785 Hyperlipidemia, unspecified: Secondary | ICD-10-CM | POA: Diagnosis not present

## 2015-11-30 DIAGNOSIS — R1084 Generalized abdominal pain: Secondary | ICD-10-CM

## 2015-11-30 DIAGNOSIS — Z79818 Long term (current) use of other agents affecting estrogen receptors and estrogen levels: Secondary | ICD-10-CM | POA: Diagnosis not present

## 2015-11-30 DIAGNOSIS — R1012 Left upper quadrant pain: Secondary | ICD-10-CM | POA: Diagnosis present

## 2015-11-30 DIAGNOSIS — Z87891 Personal history of nicotine dependence: Secondary | ICD-10-CM | POA: Insufficient documentation

## 2015-11-30 DIAGNOSIS — E119 Type 2 diabetes mellitus without complications: Secondary | ICD-10-CM | POA: Insufficient documentation

## 2015-11-30 DIAGNOSIS — M1990 Unspecified osteoarthritis, unspecified site: Secondary | ICD-10-CM | POA: Diagnosis not present

## 2015-11-30 DIAGNOSIS — K5730 Diverticulosis of large intestine without perforation or abscess without bleeding: Secondary | ICD-10-CM | POA: Diagnosis not present

## 2015-11-30 DIAGNOSIS — K5732 Diverticulitis of large intestine without perforation or abscess without bleeding: Secondary | ICD-10-CM | POA: Diagnosis not present

## 2015-11-30 DIAGNOSIS — K2970 Gastritis, unspecified, without bleeding: Secondary | ICD-10-CM | POA: Diagnosis not present

## 2015-11-30 DIAGNOSIS — Z79899 Other long term (current) drug therapy: Secondary | ICD-10-CM | POA: Insufficient documentation

## 2015-11-30 DIAGNOSIS — Z9049 Acquired absence of other specified parts of digestive tract: Secondary | ICD-10-CM | POA: Insufficient documentation

## 2015-11-30 DIAGNOSIS — R110 Nausea: Secondary | ICD-10-CM

## 2015-11-30 DIAGNOSIS — R079 Chest pain, unspecified: Secondary | ICD-10-CM | POA: Insufficient documentation

## 2015-11-30 DIAGNOSIS — I10 Essential (primary) hypertension: Secondary | ICD-10-CM | POA: Diagnosis not present

## 2015-11-30 DIAGNOSIS — R011 Cardiac murmur, unspecified: Secondary | ICD-10-CM | POA: Insufficient documentation

## 2015-11-30 DIAGNOSIS — Z7984 Long term (current) use of oral hypoglycemic drugs: Secondary | ICD-10-CM | POA: Insufficient documentation

## 2015-11-30 LAB — POCT URINALYSIS DIP (MANUAL ENTRY)
Glucose, UA: NEGATIVE
Leukocytes, UA: NEGATIVE
Nitrite, UA: NEGATIVE
PH UA: 5.5
SPEC GRAV UA: 1.02
Urobilinogen, UA: 4

## 2015-11-30 LAB — COMPREHENSIVE METABOLIC PANEL
ALBUMIN: 2.9 g/dL — AB (ref 3.5–5.0)
ALT: 16 U/L (ref 14–54)
AST: 20 U/L (ref 15–41)
Alkaline Phosphatase: 71 U/L (ref 38–126)
Anion gap: 11 (ref 5–15)
BILIRUBIN TOTAL: 1 mg/dL (ref 0.3–1.2)
BUN: 13 mg/dL (ref 6–20)
CO2: 25 mmol/L (ref 22–32)
CREATININE: 0.78 mg/dL (ref 0.44–1.00)
Calcium: 8.7 mg/dL — ABNORMAL LOW (ref 8.9–10.3)
Chloride: 109 mmol/L (ref 101–111)
GFR calc Af Amer: >60 mL/min (ref >60)
GLUCOSE: 100 mg/dL — AB (ref 65–99)
Potassium: 3.5 mmol/L (ref 3.5–5.1)
Sodium: 145 mmol/L (ref 135–145)
TOTAL PROTEIN: 6.6 g/dL (ref 6.5–8.1)

## 2015-11-30 LAB — POC MICROSCOPIC URINALYSIS (UMFC)

## 2015-11-30 LAB — URINALYSIS, ROUTINE W REFLEX MICROSCOPIC
Glucose, UA: NEGATIVE mg/dL
Hgb urine dipstick: NEGATIVE
Ketones, ur: 40 mg/dL — AB
LEUKOCYTES UA: NEGATIVE
NITRITE: NEGATIVE
PH: 5.5 (ref 5.0–8.0)
Protein, ur: NEGATIVE mg/dL
SPECIFIC GRAVITY, URINE: 1.028 (ref 1.005–1.030)

## 2015-11-30 LAB — POCT CBC
Granulocyte percent: 83.7 %G — AB (ref 37–80)
HCT, POC: 39 % (ref 37.7–47.9)
Hemoglobin: 13.2 g/dL (ref 12.2–16.2)
LYMPH, POC: 1.7 (ref 0.6–3.4)
MCH, POC: 27.9 pg (ref 27–31.2)
MCHC: 33.8 g/dL (ref 31.8–35.4)
MCV: 82.7 fL (ref 80–97)
MID (CBC): 0.3 (ref 0–0.9)
MPV: 8.3 fL (ref 0–99.8)
POC GRANULOCYTE: 10 — AB (ref 2–6.9)
POC LYMPH %: 14 % (ref 10–50)
POC MID %: 2.3 %M (ref 0–12)
Platelet Count, POC: 255 10*3/uL (ref 142–424)
RBC: 4.72 M/uL (ref 4.04–5.48)
RDW, POC: 14 %
WBC: 12 10*3/uL — AB (ref 4.6–10.2)

## 2015-11-30 LAB — CBC WITH DIFFERENTIAL/PLATELET
BASOS ABS: 0 10*3/uL (ref 0.0–0.1)
BASOS PCT: 0 %
EOS ABS: 0.1 10*3/uL (ref 0.0–0.7)
Eosinophils Relative: 1 %
HEMATOCRIT: 37.7 % (ref 36.0–46.0)
HEMOGLOBIN: 11.9 g/dL — AB (ref 12.0–15.0)
Lymphocytes Relative: 14 %
Lymphs Abs: 1.5 10*3/uL (ref 0.7–4.0)
MCH: 26.6 pg (ref 26.0–34.0)
MCHC: 31.6 g/dL (ref 30.0–36.0)
MCV: 84.3 fL (ref 78.0–100.0)
MONOS PCT: 7 %
Monocytes Absolute: 0.7 10*3/uL (ref 0.1–1.0)
NEUTROS ABS: 8.4 10*3/uL — AB (ref 1.7–7.7)
NEUTROS PCT: 78 %
Platelets: 279 10*3/uL (ref 150–400)
RBC: 4.47 MIL/uL (ref 3.87–5.11)
RDW: 13.8 % (ref 11.5–15.5)
WBC: 10.7 10*3/uL — AB (ref 4.0–10.5)

## 2015-11-30 LAB — GLUCOSE, POCT (MANUAL RESULT ENTRY): POC Glucose: 110 mg/dl — AB (ref 70–99)

## 2015-11-30 LAB — LIPASE, BLOOD: Lipase: 21 U/L (ref 11–51)

## 2015-11-30 MED ORDER — METRONIDAZOLE IN NACL 5-0.79 MG/ML-% IV SOLN
500.0000 mg | Freq: Once | INTRAVENOUS | Status: AC
  Administered 2015-11-30: 500 mg via INTRAVENOUS
  Filled 2015-11-30: qty 100

## 2015-11-30 MED ORDER — SODIUM CHLORIDE 0.9 % IV SOLN
INTRAVENOUS | Status: DC
Start: 2015-11-30 — End: 2015-11-30
  Administered 2015-11-30: 12:00:00 via INTRAVENOUS

## 2015-11-30 MED ORDER — ONDANSETRON 4 MG PO TBDP: 4.0000 mg | ORAL_TABLET | Freq: Three times a day (TID) | ORAL | Status: DC | PRN

## 2015-11-30 MED ORDER — SODIUM CHLORIDE 0.9 % IV BOLUS (SEPSIS)
250.0000 mL | Freq: Once | INTRAVENOUS | Status: AC
  Administered 2015-11-30: 250 mL via INTRAVENOUS

## 2015-11-30 MED ORDER — FENTANYL CITRATE (PF) 100 MCG/2ML IJ SOLN
25.0000 ug | Freq: Once | INTRAMUSCULAR | Status: AC
  Administered 2015-11-30: 25 ug via INTRAVENOUS
  Filled 2015-11-30: qty 2

## 2015-11-30 MED ORDER — ONDANSETRON HCL 4 MG/2ML IJ SOLN
4.0000 mg | Freq: Once | INTRAMUSCULAR | Status: AC
  Administered 2015-11-30: 4 mg via INTRAVENOUS
  Filled 2015-11-30: qty 2

## 2015-11-30 MED ORDER — IOPAMIDOL (ISOVUE-300) INJECTION 61%
INTRAVENOUS | Status: AC
  Administered 2015-11-30: 100 mL
  Filled 2015-11-30: qty 100

## 2015-11-30 MED ORDER — CIPROFLOXACIN IN D5W 400 MG/200ML IV SOLN
400.0000 mg | Freq: Once | INTRAVENOUS | Status: AC
  Administered 2015-11-30: 400 mg via INTRAVENOUS
  Filled 2015-11-30: qty 200

## 2015-11-30 MED ORDER — METRONIDAZOLE 500 MG PO TABS: 500.0000 mg | ORAL_TABLET | Freq: Three times a day (TID) | ORAL | Status: DC

## 2015-11-30 MED ORDER — CIPROFLOXACIN HCL 500 MG PO TABS: 500.0000 mg | ORAL_TABLET | Freq: Two times a day (BID) | ORAL | Status: DC

## 2015-11-30 MED ORDER — HYDROCODONE-ACETAMINOPHEN 5-325 MG PO TABS: 1.0000 | ORAL_TABLET | Freq: Four times a day (QID) | ORAL | Status: DC | PRN

## 2015-11-30 NOTE — Progress Notes (Addendum)
By signing my name below, I, Raven Small, attest that this documentation has been prepared under the direction and in the presence of Earl Lites, MD.  Electronically Signed: Andrew Au, ED Scribe. 11/30/2015. 9:31 AM.   Chief Complaint:  Chief Complaint  Patient presents with  . Abdominal Pain    x 3 days  . Nausea    HPI: Meghan Hickman is a 65 y.o. female who reports to [facility] today complaining of left lower abdominal pain and nausea that began 2 days ago. Pt states symptoms started with intermittent abdominal muscle spasm, somewhat like cramping and nausea. She also reports some constipation, fever of 100 last night and HA. She took advil last night for symptoms. She reports hx of diverticulitis, last flare being 2 years ago. Pt was diagnosed with diagnosed diverticulitis in 2009 after having CT abdomen; resolved after abx treatment. At that time she was treated with medication, denies being hospitalized. Last colonoscopy was last years.  Pt lives by herself but has family in town.   Past Medical History  Diagnosis Date  . Arthritis   . Diabetes mellitus without complication (HCC)   . Hyperlipidemia   . Heart murmur   . Hypertension   . Wears glasses   . Headache   . GERD (gastroesophageal reflux disease)   . Speech problem    Past Surgical History  Procedure Laterality Date  . Shoulder arthroscopy with subacromial decompression and bicep tendon repair Right 04/23/2013    Procedure: SHOULDER ARTHROSCOPY WITH SUBACROMIAL DECOMPRESSION AND BICEP TENDON TENOTOMY;  Surgeon: Mable Paris, MD;  Location: [facility];  Service: Orthopedics;  Laterality: Right;  . Cholecystectomy N/A 12/05/2014    Procedure: LAPAROSCOPIC CHOLECYSTECTOMY;  Surgeon: Emelia Loron, MD;  Location: [facility] OR;  Service: General;  Laterality: N/A;   Social History   Social History  . Marital Status: Single    Spouse Name: N/A  . Number of Children: 0  . Years of Education: 16     Occupational History  . Postal Solicitor    Social History Main Topics  . Smoking status: Former Smoker -- 0.20 packs/day for 10 years    Types: Cigarettes    Start date: 04/30/2012    Quit date: 08/16/2012  . Smokeless tobacco: None  . Alcohol Use: No  . Drug Use: No  . Sexual Activity: Not Asked     Comment: was smoking 1/2 pppd   Other Topics Concern  . None   Social History Narrative   Lives at home alone.   Right-handed.   2 cups caffeine per day.   Family History  Problem Relation Age of Onset  . Stroke Mother   . Hypertension Mother   . Diabetes Mother   . Breast cancer Mother   . Heart disease Brother   . Healthy Father    No Known Allergies Prior to Admission medications   Medication Sig Start Date End Date Taking? Authorizing Provider  almotriptan (AXERT) 12.5 MG tablet Take 12.5 mg by mouth daily as needed for migraine.  09/23/14  Yes Historical Provider, MD  aspirin 81 MG tablet Take 81 mg by mouth daily.   Yes Historical Provider, MD  atorvastatin (LIPITOR) 40 MG tablet Take 1 tablet (40 mg total) by mouth daily at 6 PM. 04/24/15  Yes Collene Gobble, MD  cholestyramine Lanetta Inch) 4 GM/DOSE powder Take 1 packet daily 10/30/15  Yes Collene Gobble, MD  cyclobenzaprine (FLEXERIL) 5 MG tablet Take 1 tablet (  5 mg total) by mouth 2 (two) times daily as needed. 09/30/15  Yes Thao P Le, DO  divalproex (DEPAKOTE ER) 500 MG 24 hr tablet Take 1 tablet daily for depression. 06/24/15  Yes Collene GobbleSteven A Akhilesh Sassone, MD  gabapentin (NEURONTIN) 100 MG capsule Take 100 mg by mouth 3 (three) times daily.   Yes Historical Provider, MD  HYDROcodone-acetaminophen (NORCO) 10-325 MG tablet Take 1 tablet by mouth every 8 (eight) hours as needed. Stop tramadol, take this with stool softener, no extra tylenol with this 10/30/15  Yes Collene GobbleSteven A Rishi Vicario, MD  ibuprofen (ADVIL,MOTRIN) 200 MG tablet Take 200 mg by mouth every 6 (six) hours as needed for mild pain or moderate pain.   Yes Historical Provider, MD   losartan-hydrochlorothiazide (HYZAAR) 50-12.5 MG per tablet Take 1 tablet by mouth daily. 04/24/15 04/23/16 Yes Collene GobbleSteven A Osvaldo Lamping, MD  metFORMIN (GLUCOPHAGE) 500 MG tablet Take 1 tablet (500 mg total) by mouth daily. 04/24/15  Yes Collene GobbleSteven A Zarya Lasseigne, MD  Multiple Vitamins-Minerals (MULTIVITAMIN & MINERAL PO) Take 1 tablet by mouth daily.   Yes Historical Provider, MD  temazepam (RESTORIL) 30 MG capsule TAKE 1 CAPSULE BY MOUTH AT BEDTIME AS NEEDED FOR SLEEP 09/02/15  Yes Collene GobbleSteven A Manaal Mandala, MD  traMADol (ULTRAM) 50 MG tablet Take 1 tablet (50 mg total) by mouth every 6 (six) hours as needed. Patient taking differently: Take 50 mg by mouth every 6 (six) hours as needed for moderate pain.  04/24/15  Yes Collene GobbleSteven A Ayven Glasco, MD     ROS: The patient denies chills, night sweats, unintentional weight loss, chest pain, palpitations, wheezing, dyspnea on exertion, nausea, vomiting, dysuria, hematuria, melena, numbness, weakness, or tingling.   All other systems have been reviewed and were otherwise negative with the exception of those mentioned in the HPI and as above.    PHYSICAL EXAM: Filed Vitals:   11/30/15 0917  BP: 128/75  Pulse: 108  Temp: 98.1 F (36.7 C)  Resp: 16   Body mass index is 28.87 kg/(m^2).   General: Alert, no acute distress HEENT:  Normocephalic, atraumatic, oropharynx patent. [facility] Cardiovascular:  Regular rate and rhythm, no rubs murmurs or gallops.  No Carotid bruits, radial pulse intact. No pedal edema.  Respiratory: Clear to auscultation bilaterally.  No wheezes, rales, or rhonchi.  No cyanosis, no use of accessory musculature Abdominal: . Flat, decreased BS. Sig tenderness upper abdomen and left abdomen. Mild tenderness right abdomen. Musculoskeletal: Gait intact. No edema, tenderness Skin: No rashes. Neurologic: Facial musculature symmetric. Psychiatric: Patient acts appropriately throughout our interaction. Lymphatic: No cervical or submandibular  lymphadenopathy   LABS: Results for orders placed or performed in visit on 11/30/15  POCT urinalysis dipstick  Result Value Ref Range   Color, UA yellow yellow   Clarity, UA hazy (A) clear   Glucose, UA negative negative   Bilirubin, UA moderate (A) negative   Ketones, POC UA moderate (40) (A) negative   Spec Grav, UA 1.020    Blood, UA small (A) negative   pH, UA 5.5    Protein Ur, POC =100 (A) negative   Urobilinogen, UA 4.0    Nitrite, UA Negative Negative   Leukocytes, UA Negative Negative  POCT Microscopic Urinalysis ([facility])  Result Value Ref Range   WBC,UR,HPF,POC Few (A) None WBC/hpf   RBC,UR,HPF,POC Few (A) None RBC/hpf   Bacteria Few (A) None, Too numerous to count   Mucus Present (A) Absent   Epithelial Cells, UR Per Microscopy Few (A) None, Too numerous  to count cells/hpf  POCT CBC  Result Value Ref Range   WBC 12.0 (A) 4.6 - 10.2 K/uL   Lymph, poc 1.7 0.6 - 3.4   POC LYMPH PERCENT 14.0 10 - 50 %L   MID (cbc) 0.3 0 - 0.9   POC MID % 2.3 0 - 12 %M   POC Granulocyte 10.0 (A) 2 - 6.9   Granulocyte percent 83.7 (A) 37 - 80 %G   RBC 4.72 4.04 - 5.48 M/uL   Hemoglobin 13.2 12.2 - 16.2 g/dL   HCT, POC 16.1 09.6 - 47.9 %   MCV 82.7 80 - 97 fL   MCH, POC 27.9 27 - 31.2 pg   MCHC 33.8 31.8 - 35.4 g/dL   RDW, POC 04.5 %   Platelet Count, POC 255 142 - 424 K/uL   MPV 8.3 0 - 99.8 fL  POCT glucose (manual entry)  Result Value Ref Range   POC Glucose 110 (A) 70 - 99 mg/dl     EKG/XRAY:   Primary read interpreted by Dr. Cleta Alberts at [facility].   ASSESSMENT/PLAN: Patient has had 2 previous episodes of diverticulitis. One was quite severe with question of abscess formation at that time. Patient will be transported to the emergency room for further evaluation and imaging. She is a diabetic.I personally performed the services described in this documentation, which was scribed in my presence. The recorded information has been reviewed and is accurate.   Gross sideeffects, risk  and benefits, and alternatives of medications d/w patient. Patient is aware that all medications have potential sideeffects and we are unable to predict every sideeffect or drug-drug interaction that may occur.  Lesle Chris MD 11/30/2015 9:31 AM

## 2015-11-30 NOTE — ED Notes (Signed)
Pt ambulates independently and with steady gait at time of discharge. Discharge instructions and follow up information reviewed with patient. No other questions or concerns voiced at this time.  

## 2015-11-30 NOTE — Telephone Encounter (Signed)
I received a call from the after-hours number. I called and spoke with patient. She is having abdominal discomfort left lower quadrant abdominal discomfort and is concerned about a flare of her diverticulitis. Patient was advised to come to the clinic first thing in the morning we can do her blood work and urine and see what is the source of her pain. Patient was agreeable to this plan.

## 2015-11-30 NOTE — Discharge Instructions (Signed)
Diverticulitis Diverticulitis is when small pockets that have formed in your colon (large intestine) become infected or swollen. HOME CARE  Follow your doctor's instructions.  Follow a special diet if told by your doctor.  When you feel better, your doctor may tell you to change your diet. You may be told to eat a lot of fiber. Fruits and vegetables are good sources of fiber. Fiber makes it easier to poop (have bowel movements).  Take supplements or probiotics as told by your doctor.  Only take medicines as told by your doctor.  Keep all follow-up visits with your doctor. GET HELP IF:  Your pain does not get better.  You have a hard time eating food.  You are not pooping like normal. GET HELP RIGHT AWAY IF:  Your pain gets worse.  Your problems do not get better.  Your problems suddenly get worse.  You have a fever.  You keep throwing up (vomiting).  You have bloody or black, tarry poop (stool). MAKE SURE YOU:   Understand these instructions.  Will watch your condition.  Will get help right away if you are not doing well or get worse.   This information is not intended to replace advice given to you by your health care provider. Make sure you discuss any questions you have with your health care provider.  Take antibiotics as directed. We expect to be improving in 2 days. Return for any new or worse symptoms. Make an appointment follow-up with her regular doctor. Take pain medicine as needed and as directed. Take antinausea medicine as needed   Document Released: 01/12/2008 Document Revised: 07/31/2013 Document Reviewed: 06/20/2013 Elsevier Interactive Patient Education [employer]2016 Elsevier Inc.

## 2015-11-30 NOTE — ED Provider Notes (Signed)
CSN: [id number]     Arrival date & time 11/30/15  1104 History   First MD Initiated Contact with Patient 11/30/15 1108     Chief Complaint  Patient presents with  . Abdominal Pain     (Consider location/radiation/quality/duration/timing/severity/associated sxs/prior Treatment) The history is provided by the patient.  Patient with complaint of abdominal pain left upper quadrant for 3 days. Got worse yesterday. Patient went to urgent care was referred here for additional evaluation. Patient her gallbladder removed a year ago she's also had a history of diverticulitis in the past. Patient thinks that maybe was going on this time.  Past Medical History  Diagnosis Date  . Arthritis   . Diabetes mellitus without complication (HCC)   . Hyperlipidemia   . Heart murmur   . Hypertension   . Wears glasses   . Headache   . GERD (gastroesophageal reflux disease)   . Speech problem    Past Surgical History  Procedure Laterality Date  . Shoulder arthroscopy with subacromial decompression and bicep tendon repair Right 04/23/2013    Procedure: SHOULDER ARTHROSCOPY WITH SUBACROMIAL DECOMPRESSION AND BICEP TENDON TENOTOMY;  Surgeon: Mable Paris, MD;  Location:  [facility];  Service: Orthopedics;  Laterality: Right;  . Cholecystectomy N/A 12/05/2014    Procedure: LAPAROSCOPIC CHOLECYSTECTOMY;  Surgeon: Emelia Loron, MD;  Location: [facility] OR;  Service: General;  Laterality: N/A;   Family History  Problem Relation Age of Onset  . Stroke Mother   . Hypertension Mother   . Diabetes Mother   . Breast cancer Mother   . Heart disease Brother   . Healthy Father    Social History  Substance Use Topics  . Smoking status: Former Smoker -- 0.20 packs/day for 10 years    Types: Cigarettes    Start date: 04/30/2012    Quit date: 08/16/2012  . Smokeless tobacco: None  . Alcohol Use: No   OB History    No data available     Review of Systems  Constitutional: Negative for  fever.  HENT: Negative for congestion.   Eyes: Negative for redness.  Respiratory: Negative for shortness of breath.   Cardiovascular: Positive for chest pain.  Gastrointestinal: Positive for nausea, abdominal pain, diarrhea and constipation. Negative for vomiting.  Genitourinary: Negative for dysuria.  Musculoskeletal: Negative for back pain.  Skin: Negative for rash.  Neurological: Negative for headaches.  Psychiatric/Behavioral: Negative for confusion.      Allergies  Review of patient's allergies indicates no known allergies.  Home Medications   Prior to Admission medications   Medication Sig Start Date End Date Taking? Authorizing Provider  almotriptan (AXERT) 12.5 MG tablet Take 12.5 mg by mouth daily as needed for migraine.  09/23/14  Yes Historical Provider, MD  aspirin 81 MG tablet Take 81 mg by mouth daily.   Yes Historical Provider, MD  atorvastatin (LIPITOR) 40 MG tablet Take 1 tablet (40 mg total) by mouth daily at 6 PM. 04/24/15  Yes Collene Gobble, MD  cholestyramine Lanetta Inch) 4 GM/DOSE powder Take 1 packet daily Patient taking differently: Take 4 g by mouth daily.  10/30/15  Yes Collene Gobble, MD  cyclobenzaprine (FLEXERIL) 5 MG tablet Take 1 tablet (5 mg total) by mouth 2 (two) times daily as needed. 09/30/15  Yes Thao P Le, DO  divalproex (DEPAKOTE ER) 500 MG 24 hr tablet Take 1 tablet daily for depression. Patient taking differently: Take 500 mg by mouth daily.  06/24/15  Yes Collene Gobble, MD  estradiol (ESTRACE) 0.5 MG tablet Take 0.5 mg by mouth daily. 11/12/15  Yes Historical Provider, MD  gabapentin (NEURONTIN) 100 MG capsule Take 100 mg by mouth 3 (three) times daily.   Yes Historical Provider, MD  HYDROcodone-acetaminophen (NORCO) 10-325 MG tablet Take 1 tablet by mouth every 8 (eight) hours as needed. Stop tramadol, take this with stool softener, no extra tylenol with this Patient taking differently: Take 1 tablet by mouth every 8 (eight) hours as needed for  moderate pain. Stop tramadol, take this with stool softener, no extra tylenol with this 10/30/15  Yes Collene Gobble, MD  ibuprofen (ADVIL,MOTRIN) 200 MG tablet Take 200 mg by mouth every 6 (six) hours as needed for mild pain or moderate pain.   Yes Historical Provider, MD  losartan-hydrochlorothiazide (HYZAAR) 50-12.5 MG per tablet Take 1 tablet by mouth daily. 04/24/15 04/23/16 Yes Collene Gobble, MD  metFORMIN (GLUCOPHAGE) 500 MG tablet Take 1 tablet (500 mg total) by mouth daily. 04/24/15  Yes Collene Gobble, MD  Multiple Vitamins-Minerals (MULTIVITAMIN & MINERAL PO) Take 1 tablet by mouth daily.   Yes Historical Provider, MD  temazepam (RESTORIL) 30 MG capsule TAKE 1 CAPSULE BY MOUTH AT BEDTIME AS NEEDED FOR SLEEP 09/02/15  Yes Collene Gobble, MD  traMADol (ULTRAM) 50 MG tablet Take 1 tablet (50 mg total) by mouth every 6 (six) hours as needed. Patient taking differently: Take 50 mg by mouth every 6 (six) hours as needed for moderate pain.  04/24/15  Yes Collene Gobble, MD   BP 117/58 mmHg  Pulse 94  Resp 12  Ht 5' (1.524 m)  Wt 65.772 kg  BMI 28.32 kg/m2  SpO2 97% Physical Exam  Constitutional: She is oriented to person, place, and time. She appears well-developed and well-nourished. No distress.  HENT:  Head: Normocephalic and atraumatic.  Mouth/Throat: Oropharynx is clear and moist.  Eyes: Conjunctivae and EOM are normal. Pupils are equal, round, and reactive to light.  Neck: Normal range of motion. Neck supple.  Cardiovascular: Normal rate, regular rhythm and intact distal pulses.   No murmur heard. Pulmonary/Chest: Effort normal and breath sounds normal. No respiratory distress.  Abdominal: Soft. Bowel sounds are normal. There is tenderness.  Patient with tenderness left upper quadrant no guarding.  Musculoskeletal: Normal range of motion. She exhibits no edema.  Neurological: She is alert and oriented to person, place, and time. No cranial nerve deficit. She exhibits normal muscle tone.  Coordination normal.  Skin: Skin is warm.  Nursing note and vitals reviewed.   ED Course  Procedures (including critical care time) Labs Review Labs Reviewed  COMPREHENSIVE METABOLIC PANEL - Abnormal; Notable for the following:    Glucose, Bld 100 (*)    Calcium 8.7 (*)    Albumin 2.9 (*)    All other components within normal limits  CBC WITH DIFFERENTIAL/PLATELET - Abnormal; Notable for the following:    WBC 10.7 (*)    Hemoglobin 11.9 (*)    Neutro Abs 8.4 (*)    All other components within normal limits  URINALYSIS, ROUTINE W REFLEX MICROSCOPIC (NOT AT [facility]) - Abnormal; Notable for the following:    Bilirubin Urine SMALL (*)    Ketones, ur 40 (*)    All other components within normal limits  LIPASE, BLOOD   Results for orders placed or performed during the hospital encounter of 11/30/15  Comprehensive metabolic panel  Result Value Ref Range   Sodium 145 135 - 145 mmol/L   Potassium  3.5 3.5 - 5.1 mmol/L   Chloride 109 101 - 111 mmol/L   CO2 25 22 - 32 mmol/L   Glucose, Bld 100 (H) 65 - 99 mg/dL   BUN 13 6 - 20 mg/dL   Creatinine, Ser 4.09 0.44 - 1.00 mg/dL   Calcium 8.7 (L) 8.9 - 10.3 mg/dL   Total Protein 6.6 6.5 - 8.1 g/dL   Albumin 2.9 (L) 3.5 - 5.0 g/dL   AST 20 15 - 41 U/L   ALT 16 14 - 54 U/L   Alkaline Phosphatase 71 38 - 126 U/L   Total Bilirubin 1.0 0.3 - 1.2 mg/dL   GFR calc non Af Amer >60 >60 mL/min   GFR calc Af Amer >60 >60 mL/min   Anion gap 11 5 - 15  Lipase, blood  Result Value Ref Range   Lipase 21 11 - 51 U/L  CBC with Differential/Platelet  Result Value Ref Range   WBC 10.7 (H) 4.0 - 10.5 K/uL   RBC 4.47 3.87 - 5.11 MIL/uL   Hemoglobin 11.9 (L) 12.0 - 15.0 g/dL   HCT 81.1 91.4 - 78.2 %   MCV 84.3 78.0 - 100.0 fL   MCH 26.6 26.0 - 34.0 pg   MCHC 31.6 30.0 - 36.0 g/dL   RDW 95.6 21.3 - 08.6 %   Platelets 279 150 - 400 K/uL   Neutrophils Relative % 78 %   Neutro Abs 8.4 (H) 1.7 - 7.7 K/uL   Lymphocytes Relative 14 %   Lymphs Abs 1.5 0.7  - 4.0 K/uL   Monocytes Relative 7 %   Monocytes Absolute 0.7 0.1 - 1.0 K/uL   Eosinophils Relative 1 %   Eosinophils Absolute 0.1 0.0 - 0.7 K/uL   Basophils Relative 0 %   Basophils Absolute 0.0 0.0 - 0.1 K/uL  Urinalysis, Routine w reflex microscopic (not at [facility])  Result Value Ref Range   Color, Urine YELLOW YELLOW   APPearance CLEAR CLEAR   Specific Gravity, Urine 1.028 1.005 - 1.030   pH 5.5 5.0 - 8.0   Glucose, UA NEGATIVE NEGATIVE mg/dL   Hgb urine dipstick NEGATIVE NEGATIVE   Bilirubin Urine SMALL (A) NEGATIVE   Ketones, ur 40 (A) NEGATIVE mg/dL   Protein, ur NEGATIVE NEGATIVE mg/dL   Nitrite NEGATIVE NEGATIVE   Leukocytes, UA NEGATIVE NEGATIVE     Imaging Review Ct Abdomen Pelvis W Contrast  11/30/2015  CLINICAL DATA:  Left-sided abdominal pain x3 days, nausea, constipation. Prior cholecystectomy. EXAM: CT ABDOMEN AND PELVIS WITH CONTRAST TECHNIQUE: Multidetector CT imaging of the abdomen and pelvis was performed using the standard protocol following bolus administration of intravenous contrast. CONTRAST:  ISOVUE-300 IOPAMIDOL (ISOVUE-300) INJECTION 61% COMPARISON:  04/24/2008 FINDINGS: Lower chest:  Lung bases are clear. Hepatobiliary: Scattered hepatic cysts measuring up to 6 mm (series 2/ image 21). Status post cholecystectomy. No intrahepatic or extrahepatic ductal dilatation. Pancreas: Within normal limits. Spleen: Within normal limits. Adrenals/Urinary Tract: Adrenal glands within normal limits. 2.2 cm posterior interpolar left renal cyst (series 2/image 33). Additional scattered subcentimeter bilateral renal cysts. No hydronephrosis. Bladder is mildly thick-walled but underdistended. Stomach/Bowel: Stomach is within normal limits. No evidence of bowel obstruction. Normal appendix (series 2/ image 49). Extensive colonic diverticulosis. Associated pericolonic inflammatory changes with focal wall thickening involving the distal descending colon/proximal sigmoid colon  (series 2/image 40; coronal image 48), favored to reflect colonic diverticulitis, less likely focal colitis. No drainable fluid collection/ abscess.  No free air. Vascular/Lymphatic: Atherosclerotic calcifications of the  abdominal aorta and branch vessels. No evidence of abdominal aortic aneurysm. Small retroperitoneal lymph nodes which do not meet pathologic CT size criteria, likely reactive. Reproductive: Status post hysterectomy. Bilateral ovaries are within normal limits. Other: No abdominopelvic ascites. Musculoskeletal: Degenerative changes of the visualized thoracolumbar spine. IMPRESSION: Extensive colonic diverticulosis with suspected diverticulitis involving the distal descending colon/ proximal sigmoid colon, less likely focal colitis. No drainable fluid collection/ abscess. No free air to suggest macroscopic perforation. Given the focal wall thickening, consider follow-up colonoscopy. Electronically Signed   By: Charline BillsSriyesh  Krishnan M.D.   On: 11/30/2015 13:51   Dg Abd Acute W/chest  11/30/2015  CLINICAL DATA:  Generalized abdominal pain. Nausea without vomiting. EXAM: DG ABDOMEN ACUTE W/ 1V CHEST COMPARISON:  Chest radiograph on 11/19/2009 FINDINGS: There is no evidence of dilated bowel loops or free intraperitoneal air. Several pelvic phleboliths noted but no definite radiopaque calculi identified. Right upper quadrant surgical clips from prior cholecystectomy. S shaped thoracolumbar spine scoliosis and degenerative changes noted. Heart size and mediastinal contours are within normal limits. Both lungs are clear. No evidence of pneumothorax or pleural effusion. IMPRESSION: Unremarkable bowel gas pattern.  No acute findings. No active cardiopulmonary disease. Thoracolumbar scoliosis and degenerative changes. Electronically Signed   By: Myles RosenthalJohn  Stahl M.D.   On: 11/30/2015 10:46   I have personally reviewed and evaluated these images and lab results as part of my medical decision-making.   EKG  Interpretation   Date/Time:  Sunday November 30 2015 11:37:16 EDT Ventricular Rate:  92 PR Interval:  148 QRS Duration: 85 QT Interval:  353 QTC Calculation: 437 R Axis:   -11 Text Interpretation:  Sinus rhythm Confirmed by Deretha EmoryZACKOWSKI  MD, Jaklyn Alen  [phone]) on 11/30/2015 12:04:03 PM Also confirmed by Deretha EmoryZACKOWSKI  MD, Darrion Macaulay  [phone]), editor BREWER, CCT, GLENN ([phone])  on 11/30/2015 12:58:44 PM      MDM   Final diagnoses:  Diverticulitis of large intestine without perforation or abscess without bleeding      CT scan now shows left-sided diverticulitis which probably explains the patient's pain. She is nontoxic no acute distress. Treated with Flagyl and Cipro here IV will be continued on those antibiotics orally. Patient will return if not improving in 2 days or for any new or worse symptoms.  Vanetta MuldersScott Inita Uram, MD 11/30/15 [phone]

## 2015-11-30 NOTE — Patient Instructions (Signed)
     IF you received an x-ray today, you will receive an invoice from [facility]. Please contact [facility] at [phone] with questions or concerns regarding your invoice.   IF you received labwork today, you will receive an invoice from [facility]. Please contact [facility] at [phone] with questions or concerns regarding your invoice.   Our billing staff will not be able to assist you with questions regarding bills from these companies.  You will be contacted with the lab results as soon as they are available. The fastest way to get your results is to activate your My Chart account. Instructions are located on the last page of this paperwork. If you have not heard from us regarding the results in 2 weeks, please contact this office.      

## 2015-11-30 NOTE — ED Notes (Signed)
[facility] abd pain x 3 days, worsening yesterday and today. Pt had cholecystectomy approx 1 year ago and hx diverticulitis and sts that "they thought I had an abcess in my stomach". Pt endorses intermittent diarrhea and constipation, denies emesis.

## 2015-12-02 NOTE — Telephone Encounter (Signed)
Pt is being seen

## 2015-12-03 ENCOUNTER — Ambulatory Visit (INDEPENDENT_AMBULATORY_CARE_PROVIDER_SITE_OTHER): Payer: Federal, State, Local not specified - PPO | Admitting: Emergency Medicine

## 2015-12-03 VITALS — BP 122/72 | HR 67 | Temp 97.6°F | Resp 17 | Ht 59.5 in | Wt 146.0 lb

## 2015-12-03 DIAGNOSIS — K5732 Diverticulitis of large intestine without perforation or abscess without bleeding: Secondary | ICD-10-CM | POA: Diagnosis not present

## 2015-12-03 LAB — POCT CBC
Granulocyte percent: 62.1 %G (ref 37–80)
HCT, POC: 35 % — AB (ref 37.7–47.9)
HEMOGLOBIN: 12 g/dL — AB (ref 12.2–16.2)
LYMPH, POC: 2.3 (ref 0.6–3.4)
MCH: 27.9 pg (ref 27–31.2)
MCHC: 34.2 g/dL (ref 31.8–35.4)
MCV: 81.6 fL (ref 80–97)
MID (cbc): 0.3 (ref 0–0.9)
MPV: 8.3 fL (ref 0–99.8)
PLATELET COUNT, POC: 328 10*3/uL (ref 142–424)
POC Granulocyte: 4.3 (ref 2–6.9)
POC LYMPH PERCENT: 32.9 %L (ref 10–50)
POC MID %: 5 %M (ref 0–12)
RBC: 4.29 M/uL (ref 4.04–5.48)
RDW, POC: 13.4 %
WBC: 7 10*3/uL (ref 4.6–10.2)

## 2015-12-03 NOTE — Progress Notes (Signed)
Patient ID: Meghan Hickman, female   DOB: 1950-11-07, 65 y.o.   MRN: [id number]    By signing my name below, I, Essence Howell, attest that this documentation has been prepared under the direction and in the presence of Collene GobbleSteven A Pinkney Venard, MD Electronically Signed: Charline BillsEssence Howell, ED Scribe 12/03/2015 at 2:57 PM.  Chief Complaint:  Chief Complaint  Patient presents with  . Follow-up    abdominal pain    HPI: Meghan Hickman is a 65 y.o. female who [facility] for a follow-up on abdominal pain. Pt was seen in the office on 11/30/15 and transported to the ED for acute onset of diverticulitis without abscess. She was started on Flagyl, Cipro, Zofran and Norco. Today, she reports constant, 3/10 abdominal pain that is exacerbated with palpation and eating. She states that she has been able to consume a small amount of soup and crackers to take her medications. Pt reports associated diarrhea with eating large amounts of foods. She denies fever.   Past Medical History  Diagnosis Date  . Arthritis   . Diabetes mellitus without complication (HCC)   . Hyperlipidemia   . Heart murmur   . Hypertension   . Wears glasses   . Headache   . GERD (gastroesophageal reflux disease)   . Speech problem    Past Surgical History  Procedure Laterality Date  . Shoulder arthroscopy with subacromial decompression and bicep tendon repair Right 04/23/2013    Procedure: SHOULDER ARTHROSCOPY WITH SUBACROMIAL DECOMPRESSION AND BICEP TENDON TENOTOMY;  Surgeon: Mable ParisJustin William Chandler, MD;  Location: [facility];  Service: Orthopedics;  Laterality: Right;  . Cholecystectomy N/A 12/05/2014    Procedure: LAPAROSCOPIC CHOLECYSTECTOMY;  Surgeon: Emelia LoronMatthew Wakefield, MD;  [facility];  Service: General;  Laterality: N/A;   Social History   Social History  . Marital Status: Single    Spouse Name: N/A  . Number of Children: 0  . Years of Education: 16   Occupational History  . Postal SolicitorClerk     Social History Main Topics  . Smoking status: Former Smoker -- 0.20 packs/day for 10 years    Types: Cigarettes    Start date: 04/30/2012    Quit date: 08/16/2012  . Smokeless tobacco: None  . Alcohol Use: No  . Drug Use: No  . Sexual Activity: Not Asked     Comment: was smoking 1/2 pppd   Other Topics Concern  . None   Social History Narrative   Lives at home alone.   Right-handed.   2 cups caffeine per day.   Family History  Problem Relation Age of Onset  . Stroke Mother   . Hypertension Mother   . Diabetes Mother   . Breast cancer Mother   . Heart disease Brother   . Healthy Father    No Known Allergies Prior to Admission medications   Medication Sig Start Date End Date Taking? Authorizing Provider  almotriptan (AXERT) 12.5 MG tablet Take 12.5 mg by mouth daily as needed for migraine.  09/23/14  Yes Historical Provider, MD  aspirin 81 MG tablet Take 81 mg by mouth daily.   Yes Historical Provider, MD  atorvastatin (LIPITOR) 40 MG tablet Take 1 tablet (40 mg total) by mouth daily at 6 PM. 04/24/15  Yes Collene GobbleSteven A Prayan Ulin, MD  cholestyramine Lanetta Inch(QUESTRAN) 4 GM/DOSE powder Take 1 packet daily Patient taking differently: Take 4 g by mouth daily.  10/30/15  Yes Collene GobbleSteven A Ronneisha Jett, MD  ciprofloxacin (CIPRO) 500 MG  tablet Take 1 tablet (500 mg total) by mouth 2 (two) times daily. 11/30/15  Yes Vanetta Mulders, MD  cyclobenzaprine (FLEXERIL) 5 MG tablet Take 1 tablet (5 mg total) by mouth 2 (two) times daily as needed. 09/30/15  Yes Thao P Le, DO  divalproex (DEPAKOTE ER) 500 MG 24 hr tablet Take 1 tablet daily for depression. Patient taking differently: Take 500 mg by mouth daily.  06/24/15  Yes Collene Gobble, MD  estradiol (ESTRACE) 0.5 MG tablet Take 0.5 mg by mouth daily. 11/12/15  Yes Historical Provider, MD  gabapentin (NEURONTIN) 100 MG capsule Take 100 mg by mouth 3 (three) times daily.   Yes Historical Provider, MD  HYDROcodone-acetaminophen (NORCO) 10-325 MG tablet Take 1 tablet by  mouth every 8 (eight) hours as needed. Stop tramadol, take this with stool softener, no extra tylenol with this Patient taking differently: Take 1 tablet by mouth every 8 (eight) hours as needed for moderate pain. Stop tramadol, take this with stool softener, no extra tylenol with this 10/30/15  Yes Collene Gobble, MD  HYDROcodone-acetaminophen (NORCO/VICODIN) 5-325 MG tablet Take 1-2 tablets by mouth every 6 (six) hours as needed for moderate pain. 11/30/15  Yes Vanetta Mulders, MD  ibuprofen (ADVIL,MOTRIN) 200 MG tablet Take 200 mg by mouth every 6 (six) hours as needed for mild pain or moderate pain.   Yes Historical Provider, MD  losartan-hydrochlorothiazide (HYZAAR) 50-12.5 MG per tablet Take 1 tablet by mouth daily. 04/24/15 04/23/16 Yes Collene Gobble, MD  metFORMIN (GLUCOPHAGE) 500 MG tablet Take 1 tablet (500 mg total) by mouth daily. 04/24/15  Yes Collene Gobble, MD  metroNIDAZOLE (FLAGYL) 500 MG tablet Take 1 tablet (500 mg total) by mouth 3 (three) times daily. 11/30/15  Yes Vanetta Mulders, MD  Multiple Vitamins-Minerals (MULTIVITAMIN & MINERAL PO) Take 1 tablet by mouth daily.   Yes Historical Provider, MD  ondansetron (ZOFRAN ODT) 4 MG disintegrating tablet Take 1 tablet (4 mg total) by mouth every 8 (eight) hours as needed for nausea or vomiting. 11/30/15  Yes Vanetta Mulders, MD  temazepam (RESTORIL) 30 MG capsule TAKE 1 CAPSULE BY MOUTH AT BEDTIME AS NEEDED FOR SLEEP 09/02/15  Yes Collene Gobble, MD  traMADol (ULTRAM) 50 MG tablet Take 1 tablet (50 mg total) by mouth every 6 (six) hours as needed. Patient taking differently: Take 50 mg by mouth every 6 (six) hours as needed for moderate pain.  04/24/15  Yes Collene Gobble, MD   ROS: The patient denies fevers, chills, night sweats, unintentional weight loss, chest pain, palpitations, wheezing, dyspnea on exertion, nausea, vomiting, dysuria, hematuria, melena, numbness, weakness, or tingling.   All other systems have been reviewed and were otherwise  negative with the exception of those mentioned in the HPI and as above.    PHYSICAL EXAM: Filed Vitals:   12/03/15 1426  BP: 122/72  Pulse: 67  Temp: 97.6 F (36.4 C)  Resp: 17   Body mass index is 29.01 kg/(m^2).  General: Alert, no acute distress HEENT:  Normocephalic, atraumatic, oropharynx patent. Eye: [facility] Cardiovascular: Regular rate and rhythm, no rubs murmurs or gallops. No Carotid bruits, radial pulse intact. No pedal edema.  Respiratory: Clear to auscultation bilaterally. No wheezes, rales, or rhonchi. No cyanosis, no use of accessory musculature Abdominal: Non-distended. Persistent L mid and low abdominal tenderness.  Musculoskeletal: Gait intact. No edema, tenderness Skin: No rashes. Neurologic: Facial musculature symmetric. Psychiatric: Patient acts appropriately throughout our interaction. Lymphatic: No cervical or submandibular lymphadenopathy  LABS:  Results for orders placed or performed in visit on 12/03/15  POCT CBC  Result Value Ref Range   WBC 7.0 4.6 - 10.2 K/uL   Lymph, poc 2.3 0.6 - 3.4   POC LYMPH PERCENT 32.9 10 - 50 %L   MID (cbc) 0.3 0 - 0.9   POC MID % 5.0 0 - 12 %M   POC Granulocyte 4.3 2 - 6.9   Granulocyte percent 62.1 37 - 80 %G   RBC 4.29 4.04 - 5.48 M/uL   Hemoglobin 12.0 (A) 12.2 - 16.2 g/dL   HCT, POC 40.9 (A) 81.1 - 47.9 %   MCV 81.6 80 - 97 fL   MCH, POC 27.9 27 - 31.2 pg   MCHC 34.2 31.8 - 35.4 g/dL   RDW, POC 91.4 %   Platelet Count, POC 328 142 - 424 K/uL   MPV 8.3 0 - 99.8 fL    EKG/XRAY:   Primary read interpreted by Dr. Cleta Alberts at [facility].  ASSESSMENT/PLAN: White count is back to normal. Will finish antibiotics and I will see next week. Referral made to Dr. Kinnie Scales.I personally performed the services described in this documentation, which was scribed in my presence. The recorded information has been reviewed and is accurate.    Gross sideeffects, risk and benefits, and alternatives of medications d/w patient.  Patient is aware that all medications have potential sideeffects and we are unable to predict every sideeffect or drug-drug interaction that may occur.  Lesle Chris MD 12/03/2015 2:43 PM

## 2015-12-03 NOTE — Patient Instructions (Addendum)
   IF you received an x-ray today, you will receive an invoice from [facility]. Please contact [facility] at [phone] with questions or concerns regarding your invoice.   IF you received labwork today, you will receive an invoice from [facility]. Please contact [facility] at [phone] with questions or concerns regarding your invoice.   Our billing staff will not be able to assist you with questions regarding bills from these companies.  You will be contacted with the lab results as soon as they are available. The fastest way to get your results is to activate your My Chart account. Instructions are located on the last page of this paperwork. If you have not heard from us regarding the results in 2 weeks, please contact this office.     Diverticulitis Diverticulitis is inflammation or infection of small pouches in your colon that form when you have a condition called diverticulosis. The pouches in your colon are called diverticula. Your colon, or large intestine, is where water is absorbed and stool is formed. Complications of diverticulitis can include:  Bleeding.  Severe infection.  Severe pain.  Perforation of your colon.  Obstruction of your colon. CAUSES  Diverticulitis is caused by bacteria. Diverticulitis happens when stool becomes trapped in diverticula. This allows bacteria to grow in the diverticula, which can lead to inflammation and infection. RISK FACTORS People with diverticulosis are at risk for diverticulitis. Eating a diet that does not include enough fiber from fruits and vegetables may make diverticulitis more likely to develop. SYMPTOMS  Symptoms of diverticulitis may include:  Abdominal pain and tenderness. The pain is normally located on the left side of the abdomen, but may occur in other areas.  Fever and chills.  Bloating.  Cramping.  Nausea.  Vomiting.  Constipation.  Diarrhea.  Blood in  your stool. DIAGNOSIS  Your health care provider will ask you about your medical history and do a physical exam. You may need to have tests done because many medical conditions can cause the same symptoms as diverticulitis. Tests may include:  Blood tests.  Urine tests.  Imaging tests of the abdomen, including X-rays and CT scans. When your condition is under control, your health care provider may recommend that you have a colonoscopy. A colonoscopy can show how severe your diverticula are and whether something else is causing your symptoms. TREATMENT  Most cases of diverticulitis are mild and can be treated at home. Treatment may include:  Taking over-the-counter pain medicines.  Following a clear liquid diet.  Taking antibiotic medicines by mouth for 7-10 days. More severe cases may be treated at a hospital. Treatment may include:  Not eating or drinking.  Taking prescription pain medicine.  Receiving antibiotic medicines through an IV tube.  Receiving fluids and nutrition through an IV tube.  Surgery. HOME CARE INSTRUCTIONS   Follow your health care provider's instructions carefully.  Follow a full liquid diet or other diet as directed by your health care provider. After your symptoms improve, your health care provider may tell you to change your diet. He or she may recommend you eat a high-fiber diet. Fruits and vegetables are good sources of fiber. Fiber makes it easier to pass stool.  Take fiber supplements or probiotics as directed by your health care provider.  Only take medicines as directed by your health care provider.  Keep all your follow-up appointments. SEEK MEDICAL CARE IF:   Your pain does not improve.  You have a hard time eating   food.  Your bowel movements do not return to normal. SEEK IMMEDIATE MEDICAL CARE IF:   Your pain becomes worse.  Your symptoms do not get better.  Your symptoms suddenly get worse.  You have a fever.  You have  repeated vomiting.  You have bloody or black, tarry stools. MAKE SURE YOU:   Understand these instructions.  Will watch your condition.  Will get help right away if you are not doing well or get worse.   This information is not intended to replace advice given to you by your health care provider. Make sure you discuss any questions you have with your health care provider.   Document Released: 05/05/2005 Document Revised: 07/31/2013 Document Reviewed: 06/20/2013 Elsevier Interactive Patient Education 2016 Elsevier Inc.  

## 2015-12-04 ENCOUNTER — Telehealth: Payer: Self-pay

## 2015-12-04 NOTE — Telephone Encounter (Signed)
Patient brought in FMLA forms to be completed by Dr Cleta Albertsaub, for her lower abdomen pain.  I have filled out what I could from the OV notes and I will place the forms in your box on 12/04/15. If you could return them to the FMLA/Dsiability box at the [employer] checkout desk within 5-7 business days. Thank you!

## 2015-12-05 NOTE — Telephone Encounter (Signed)
Paperwork scanned and called patient to come pick them up I will place in pick up draw at 102.

## 2015-12-10 ENCOUNTER — Ambulatory Visit (INDEPENDENT_AMBULATORY_CARE_PROVIDER_SITE_OTHER): Payer: Federal, State, Local not specified - PPO | Admitting: Family Medicine

## 2015-12-10 VITALS — BP 132/80 | HR 94 | Temp 98.5°F | Resp 18 | Ht 59.5 in | Wt 145.0 lb

## 2015-12-10 DIAGNOSIS — R195 Other fecal abnormalities: Secondary | ICD-10-CM | POA: Diagnosis not present

## 2015-12-10 DIAGNOSIS — D649 Anemia, unspecified: Secondary | ICD-10-CM | POA: Diagnosis not present

## 2015-12-10 DIAGNOSIS — R1013 Epigastric pain: Secondary | ICD-10-CM

## 2015-12-10 MED ORDER — RANITIDINE HCL 150 MG PO TABS: 150.0000 mg | ORAL_TABLET | Freq: Two times a day (BID) | ORAL | Status: DC

## 2015-12-10 NOTE — Progress Notes (Signed)
Subjective:    Patient ID: Meghan Hickman, female    DOB: 1951-02-09, 65 y.o.   MRN: [id number]  HPI This is a pleasant 65 yo female who presents today for follow up of an episode of diverticulitis. She was seen in the office and had to go to the ER where CT showed-  Extensive colonic diverticulosis with suspected diverticulitis involving the distal descending colon/ proximal sigmoid colon, less likely focal colitis. She has completed her flagyl and cipro. She has been having very dark stools for 3-4 days, not sticky. No blood or mucous. Has felt tired and weak for about 5 days. No palpitations or chest pain. No fever. Appetite improving. She has an appointment scheduled with GI ([facility]).   Past Medical History  Diagnosis Date  . Arthritis   . Diabetes mellitus without complication (HCC)   . Hyperlipidemia   . Heart murmur   . Hypertension   . Wears glasses   . Headache   . GERD (gastroesophageal reflux disease)   . Speech problem    Past Surgical History  Procedure Laterality Date  . Shoulder arthroscopy with subacromial decompression and bicep tendon repair Right 04/23/2013    Procedure: SHOULDER ARTHROSCOPY WITH SUBACROMIAL DECOMPRESSION AND BICEP TENDON TENOTOMY;  Surgeon: Mable ParisJustin William Chandler, MD;  [facility];  Service: Orthopedics;  Laterality: Right;  . Cholecystectomy N/A 12/05/2014    Procedure: LAPAROSCOPIC CHOLECYSTECTOMY;  Surgeon: Emelia LoronMatthew Wakefield, MD;  [facility];  Service: General;  Laterality: N/A;   Family History  Problem Relation Age of Onset  . Stroke Mother   . Hypertension Mother   . Diabetes Mother   . Breast cancer Mother   . Heart disease Brother   . Healthy Father    Social History  Substance Use Topics  . Smoking status: Former Smoker -- 0.20 packs/day for 10 years    Types: Cigarettes    Start date: 04/30/2012    Quit date: 08/16/2012  . Smokeless tobacco: None  . Alcohol Use: No     Review of Systems Per  HPI    Objective:   Physical Exam Physical Exam  Constitutional: Oriented to person, place, and time. She appears well-developed and well-nourished.  HENT:  Head: Normocephalic and atraumatic.  Eyes: Conjunctivae are normal.  Neck: Normal range of motion. Neck supple.  Cardiovascular: Normal rate, regular rhythm and normal heart sounds.   Pulmonary/Chest: Effort normal and breath sounds normal.  Abdominal: normal bowel sounds, diffuse, mild tenderness. No rebound or masses.  Musculoskeletal: Normal range of motion.  Neurological: Alert and oriented to person, place, and time.  Skin: Skin is warm and dry.  Psychiatric: Normal mood and affect. Behavior is normal. Judgment and thought content normal.  Vitals reviewed.   BP 132/80 mmHg  Pulse 94  Temp(Src) 98.5 F (36.9 C) (Oral)  Resp 18  Ht 4' 11.5" (1.511 m)  Wt 145 lb (65.772 kg)  BMI 28.81 kg/m2  SpO2 97% Wt Readings from Last 3 Encounters:  12/10/15 145 lb (65.772 kg)  12/03/15 146 lb (66.225 kg)  11/30/15 145 lb (65.772 kg)       Assessment & Plan:  Discussed with Dr. Cleta Albertsaub who also examined patient  1. Anemia, unspecified anemia type - POCT CBC- unable to obtain blood sample - will check home collected hemoccult x 3 - follow up with GI as scheduled  2. Abdominal pain, epigastric - ranitidine (ZANTAC) 150 MG tablet; Take 1 tablet (150 mg total) by mouth 2 (  two) times daily. For 2 weeks then as needed  Dispense: 60 tablet; Refill: 0  3. Dark stools - hemoccult cards x 3   Olean Ree, FNP-BC  [facility], [facility]  12/12/2015 10:32 AM

## 2015-12-10 NOTE — Patient Instructions (Signed)
     IF you received an x-ray today, you will receive an invoice from [facility]. Please contact  Radiology at [phone] with questions or concerns regarding your invoice.   IF you received labwork today, you will receive an invoice from [facility]. Please contact [facility] at [phone] with questions or concerns regarding your invoice.   Our billing staff will not be able to assist you with questions regarding bills from these companies.  You will be contacted with the lab results as soon as they are available. The fastest way to get your results is to activate your My Chart account. Instructions are located on the last page of this paperwork. If you have not heard from us regarding the results in 2 weeks, please contact this office.      

## 2015-12-17 ENCOUNTER — Telehealth: Payer: Self-pay

## 2015-12-17 NOTE — Telephone Encounter (Signed)
Per Dr. Cleta Albertsaub called patient to verify she is taking her BP medication.  She stated that she had stopped taking it for two weeks when she was having stomach issues, but has now resumed taking it.   I advised her to take it daily as directed.  She said she will do so.

## 2015-12-18 ENCOUNTER — Ambulatory Visit (INDEPENDENT_AMBULATORY_CARE_PROVIDER_SITE_OTHER): Payer: Federal, State, Local not specified - PPO | Admitting: Physician Assistant

## 2015-12-18 VITALS — BP 120/80 | HR 118 | Temp 100.1°F | Resp 18 | Ht 59.0 in | Wt 143.0 lb

## 2015-12-18 DIAGNOSIS — R070 Pain in throat: Secondary | ICD-10-CM | POA: Diagnosis not present

## 2015-12-18 DIAGNOSIS — J019 Acute sinusitis, unspecified: Secondary | ICD-10-CM | POA: Diagnosis not present

## 2015-12-18 LAB — POCT CBC
Granulocyte percent: 84.2 %G — AB (ref 37–80)
HCT, POC: 36.6 % — AB (ref 37.7–47.9)
HEMOGLOBIN: 12.4 g/dL (ref 12.2–16.2)
LYMPH, POC: 2.3 (ref 0.6–3.4)
MCH, POC: 27.8 pg (ref 27–31.2)
MCHC: 33.8 g/dL (ref 31.8–35.4)
MCV: 82.1 fL (ref 80–97)
MID (cbc): 0.2 (ref 0–0.9)
MPV: 8.1 fL (ref 0–99.8)
PLATELET COUNT, POC: 380 10*3/uL (ref 142–424)
POC Granulocyte: 13.6 — AB (ref 2–6.9)
POC LYMPH %: 14.4 % (ref 10–50)
POC MID %: 1.4 %M (ref 0–12)
RBC: 4.45 M/uL (ref 4.04–5.48)
RDW, POC: 15.4 %
WBC: 16.2 10*3/uL — AB (ref 4.6–10.2)

## 2015-12-18 LAB — POCT RAPID STREP A (OFFICE): Rapid Strep A Screen: NEGATIVE

## 2015-12-18 MED ORDER — AMOXICILLIN-POT CLAVULANATE 875-125 MG PO TABS: 1.0000 | ORAL_TABLET | Freq: Two times a day (BID) | ORAL | Status: AC

## 2015-12-18 MED ORDER — GUAIFENESIN ER 1200 MG PO TB12: 1.0000 | ORAL_TABLET | Freq: Two times a day (BID) | ORAL | Status: DC | PRN

## 2015-12-18 MED ORDER — IBUPROFEN 200 MG PO TABS
600.0000 mg | ORAL_TABLET | Freq: Once | ORAL | Status: AC
  Administered 2015-12-18: 600 mg via ORAL

## 2015-12-18 NOTE — Patient Instructions (Addendum)
IF you received an x-ray today, you will receive an invoice from [facility]. Please contact [facility] at [phone] with questions or concerns regarding your invoice.   IF you received labwork today, you will receive an invoice from [employer]. Please contact [facility] at [phone] with questions or concerns regarding your invoice.   Our billing staff will not be able to assist you with questions regarding bills from these companies.  You will be contacted with the lab results as soon as they are available. The fastest way to get your results is to activate your My Chart account. Instructions are located on the last page of this paperwork. If you have not heard from [place] regarding the results in 2 weeks, please contact this office.    Please hydrate well with water, 64 oz (almost 4 regular sized water bottles) Take the mucinex for your congestion.   Sinusitis, Adult Sinusitis is redness, soreness, and inflammation of the paranasal sinuses. Paranasal sinuses are air pockets within the bones of your face. They are located beneath your eyes, in the middle of your forehead, and above your eyes. In healthy paranasal sinuses, mucus is able to drain out, and air is able to circulate through them by way of your nose. However, when your paranasal sinuses are inflamed, mucus and air can become trapped. This can allow bacteria and other germs to grow and cause infection. Sinusitis can develop quickly and last only a short time (acute) or continue over a long period (chronic). Sinusitis that lasts for more than 12 weeks is considered chronic. CAUSES Causes of sinusitis include:  Allergies.  Structural abnormalities, such as displacement of the cartilage that separates your nostrils (deviated septum), which can decrease the air flow through your nose and sinuses and affect sinus drainage.  Functional abnormalities, such as when the small hairs  (cilia) that line your sinuses and help remove mucus do not work properly or are not present. SIGNS AND SYMPTOMS Symptoms of acute and chronic sinusitis are the same. The primary symptoms are pain and pressure around the affected sinuses. Other symptoms include:  Upper toothache.  Earache.  Headache.  Bad breath.  Decreased sense of smell and taste.  A cough, which worsens when you are lying flat.  Fatigue.  Fever.  Thick drainage from your nose, which often is green and may contain pus (purulent).  Swelling and warmth over the affected sinuses. DIAGNOSIS Your health care provider will perform a physical exam. During your exam, your health care provider may perform any of the following to help determine if you have acute sinusitis or chronic sinusitis:  Look in your nose for signs of abnormal growths in your nostrils (nasal polyps).  Tap over the affected sinus to check for signs of infection.  View the inside of your sinuses using an imaging device that has a light attached (endoscope). If your health care provider suspects that you have chronic sinusitis, one or more of the following tests may be recommended:  Allergy tests.  Nasal culture. A sample of mucus is taken from your nose, sent to a lab, and screened for bacteria.  Nasal cytology. A sample of mucus is taken from your nose and examined by your health care provider to determine if your sinusitis is related to an allergy. TREATMENT Most cases of acute sinusitis are related to a viral infection and will resolve on their own within 10 days. Sometimes, medicines are prescribed to help relieve symptoms of both  acute and chronic sinusitis. These may include pain medicines, decongestants, nasal steroid sprays, or saline sprays. However, for sinusitis related to a bacterial infection, your health care provider will prescribe antibiotic medicines. These are medicines that will help kill the bacteria causing the  infection. Rarely, sinusitis is caused by a fungal infection. In these cases, your health care provider will prescribe antifungal medicine. For some cases of chronic sinusitis, surgery is needed. Generally, these are cases in which sinusitis recurs more than 3 times per year, despite other treatments. HOME CARE INSTRUCTIONS  Drink plenty of water. Water helps thin the mucus so your sinuses can drain more easily.  Use a humidifier.  Inhale steam 3-4 times a day (for example, sit in the bathroom with the shower running).  Apply a warm, moist washcloth to your face 3-4 times a day, or as directed by your health care provider.  Use saline nasal sprays to help moisten and clean your sinuses.  Take medicines only as directed by your health care provider.  If you were prescribed either an antibiotic or antifungal medicine, finish it all even if you start to feel better. SEEK IMMEDIATE MEDICAL CARE IF:  You have increasing pain or severe headaches.  You have nausea, vomiting, or drowsiness.  You have swelling around your face.  You have vision problems.  You have a stiff neck.  You have difficulty breathing.   This information is not intended to replace advice given to you by your health care provider. Make sure you discuss any questions you have with your health care provider.   Document Released: 07/26/2005 Document Revised: 08/16/2014 Document Reviewed: 08/10/2011 Elsevier Interactive Patient Education [employer]2016 Elsevier Inc.

## 2015-12-20 LAB — CULTURE, GROUP A STREP: Organism ID, Bacteria: NORMAL

## 2015-12-22 ENCOUNTER — Encounter: Payer: Self-pay | Admitting: Neurology

## 2015-12-22 ENCOUNTER — Ambulatory Visit (INDEPENDENT_AMBULATORY_CARE_PROVIDER_SITE_OTHER): Payer: Federal, State, Local not specified - PPO | Admitting: Neurology

## 2015-12-22 VITALS — BP 126/72 | HR 84 | Ht 59.0 in | Wt 142.8 lb

## 2015-12-22 DIAGNOSIS — R479 Unspecified speech disturbances: Secondary | ICD-10-CM

## 2015-12-22 DIAGNOSIS — I679 Cerebrovascular disease, unspecified: Secondary | ICD-10-CM | POA: Insufficient documentation

## 2015-12-22 NOTE — Progress Notes (Signed)
Chief Complaint  Patient presents with  . Speech Abnormality    She has continued to have problems with slurred speech.  She is still in the process of getting several teeth replaced.    Chief Complaint  Patient presents with  . Speech Abnormality    She has continued to have problems with slurred speech.  She is still in the process of getting several teeth replaced.       PATIENT: Meghan GardenerLinda M Hickman DOB: [birth year]  Chief Complaint  Patient presents with  . Speech Abnormality    She has continued to have problems with slurred speech.  She is still in the process of getting several teeth replaced.      HISTORICAL  Meghan Hickman is a 65 years old right-handed female, alone at today's clinical visit, seen in refer by  her primary care physician Dr. Elgie CongoStephen Daub in July 08 2015 for evaluation of speech difficulty  She had a history of hypertension, hyperlipidemia, diabetes, depression, was recently started on Depakote 500 mg daily  She began to noticed gradual onset slurred speech since April 2016, initially attributed to her dental work, even after it was fixed, she continue have progressive slow talking, struggle with words, intermittent slurring, she denies chewing swallowing difficulty, she denies visual loss, she work as a Hydrographic surveyorpost office clerk, no limb muscle weakness,  She recently found to have enlarged thyroid, needle biopsy is planned, ultrasound of carotid artery was patent,  We have Reviewed CAT scan in October 2016, periventricular white matter disease, no acute lesions  UPDATE Jul 24 2015: She still struggle with words, mild slur,word finding difficulty, mild confusion. MRI of the brain reviewed, mild small vessel disease, more on left hemisphere  She has no acute lesions  UPDATE May 15th 2017:  She suffered sinus infection in early May, is treated with antibiotics, she had a temporary upper denture, complains of falling object sensation, mildly slurred speech, but  overall is getting better, denies swallowing difficulty, she denies limb muscle weakness  I have reviewed laboratory evaluation, elevated CBC in Dec 18 2014 16.2,  Personally reviewed MRI of the brain with patients again, supratentorium small vessel disease, especially at the deep white matter, she also complains of worsening depression, bilateral ear tinnitus,  REVIEW OF SYSTEMS: Full 14 system review of systems performed and notable only for headaches, numbness, slurred speech, insomnia, sleepiness, depression ALLERGIES: No Known Allergies  HOME MEDICATIONS: Current Outpatient Prescriptions  Medication Sig Dispense Refill  . almotriptan (AXERT) 12.5 MG tablet Take 12.5 mg by mouth daily as needed for migraine.   4  . amoxicillin-clavulanate (AUGMENTIN) 875-125 MG tablet Take 1 tablet by mouth 2 (two) times daily. 20 tablet 0  . aspirin 81 MG tablet Take 81 mg by mouth daily.    [name] Kitchen. atorvastatin (LIPITOR) 40 MG tablet Take 1 tablet (40 mg total) by mouth daily at 6 PM. 90 tablet 3  . cyclobenzaprine (FLEXERIL) 5 MG tablet Take 1 tablet (5 mg total) by mouth 2 (two) times daily as needed. 30 tablet 0  . divalproex (DEPAKOTE ER) 500 MG 24 hr tablet Take 1 tablet daily for depression. (Patient taking differently: Take 500 mg by mouth daily. ) 30 tablet 11  . estradiol (ESTRACE) 0.5 MG tablet Take 0.5 mg by mouth daily.  0  . gabapentin (NEURONTIN) 100 MG capsule Take 100 mg by mouth 3 (three) times daily.    . Guaifenesin (MUCINEX MAXIMUM STRENGTH) 1200 MG TB12 Take 1 tablet (1,200  mg total) by mouth every 12 (twelve) hours as needed. 14 tablet 1  . HYDROcodone-acetaminophen (NORCO) 10-325 MG tablet Take 1 tablet by mouth every 8 (eight) hours as needed. Stop tramadol, take this with stool softener, no extra tylenol with this (Patient taking differently: Take 1 tablet by mouth every 8 (eight) hours as needed for moderate pain. Stop tramadol, take this with stool softener, no extra tylenol with  this) 30 tablet 0  . HYDROcodone-acetaminophen (NORCO/VICODIN) 5-325 MG tablet Take 1-2 tablets by mouth every 6 (six) hours as needed for moderate pain. 10 tablet 0  . ibuprofen (ADVIL,MOTRIN) 200 MG tablet Take 200 mg by mouth every 6 (six) hours as needed for mild pain or moderate pain.    [name] Kitchen losartan-hydrochlorothiazide (HYZAAR) 50-12.5 MG per tablet Take 1 tablet by mouth daily. 90 tablet 3  . metFORMIN (GLUCOPHAGE) 500 MG tablet Take 1 tablet (500 mg total) by mouth daily. 90 tablet 3  . Multiple Vitamins-Minerals (MULTIVITAMIN & MINERAL PO) Take 1 tablet by mouth daily.    . ondansetron (ZOFRAN ODT) 4 MG disintegrating tablet Take 1 tablet (4 mg total) by mouth every 8 (eight) hours as needed for nausea or vomiting. 10 tablet 1  . ranitidine (ZANTAC) 150 MG tablet Take 1 tablet (150 mg total) by mouth 2 (two) times daily. For 2 weeks then as needed 60 tablet 0  . temazepam (RESTORIL) 30 MG capsule TAKE 1 CAPSULE BY MOUTH AT BEDTIME AS NEEDED FOR SLEEP 30 capsule 3  . traMADol (ULTRAM) 50 MG tablet Take 1 tablet (50 mg total) by mouth every 6 (six) hours as needed. 30 tablet 2   No current facility-administered medications for this visit.    PAST MEDICAL HISTORY: Past Medical History  Diagnosis Date  . Arthritis   . Diabetes mellitus without complication (HCC)   . Hyperlipidemia   . Heart murmur   . Hypertension   . Wears glasses   . Headache   . GERD (gastroesophageal reflux disease)   . Speech problem     PAST SURGICAL HISTORY: Past Surgical History  Procedure Laterality Date  . Shoulder arthroscopy with subacromial decompression and bicep tendon repair Right 04/23/2013    Procedure: SHOULDER ARTHROSCOPY WITH SUBACROMIAL DECOMPRESSION AND BICEP TENDON TENOTOMY;  Surgeon: Mable Paris, MD;  Location: [facility];  Service: Orthopedics;  Laterality: Right;  . Cholecystectomy N/A 12/05/2014    Procedure: LAPAROSCOPIC CHOLECYSTECTOMY;  Surgeon: Emelia Loron, MD;  Location: [facility] OR;  Service: General;  Laterality: N/A;    FAMILY HISTORY: Family History  Problem Relation Age of Onset  . Stroke Mother   . Hypertension Mother   . Diabetes Mother   . Breast cancer Mother   . Heart disease Brother   . Healthy Father     SOCIAL HISTORY:  Social History   Social History  . Marital Status: Single    Spouse Name: N/A  . Number of Children: 0  . Years of Education: 16   Occupational History  . Postal Solicitor    Social History Main Topics  . Smoking status: Former Smoker -- 0.20 packs/day for 10 years    Types: Cigarettes    Start date: 04/30/2012    Quit date: 08/16/2012  . Smokeless tobacco: Not on file  . Alcohol Use: No  . Drug Use: No  . Sexual Activity: No     Comment: was smoking 1/2 pppd   Other Topics Concern  . Not on file   Social  History Narrative   Lives at home alone.   Right-handed.   2 cups caffeine per day.     PHYSICAL EXAM   Filed Vitals:   12/22/15 0721  BP: 126/72  Pulse: 84  Height: 4\' 11"  (1.499 m)  Weight: 142 lb 12 oz (64.751 kg)    Not recorded      Body mass index is 28.82 kg/(m^2).  PHYSICAL EXAMNIATION:  Gen: NAD, conversant, well nourised, obese, well groomed                     Cardiovascular: Regular rate rhythm, no peripheral edema, warm, nontender. Eyes: Conjunctivae clear without exudates or hemorrhage Neck: Supple, no carotid bruise. Pulmonary: Clear to auscultation bilaterally   NEUROLOGICAL EXAM:  MENTAL STATUS: Speech:  Mild hesitation in her speech, mild slurred, but variable degree, struggle with the world, normal comprehension, Cognition:     Orientation to time, place and person     Normal recent and remote memory     Normal Attention span and concentration     Normal Language, naming, repeating,spontaneous speech     Fund of knowledge   CRANIAL NERVES: CN II: Visual fields are full to confrontation. Fundoscopic exam is normal with sharp discs and  no vascular changes. Pupils are round equal and briskly reactive to light. CN III, IV, VI: extraocular movement are normal. No ptosis. CN V: Facial sensation is intact to pinprick in all 3 divisions bilaterally. Corneal responses are intact.  CN VII: Face is symmetric with normal eye closure and smile. CN VIII: Hearing is normal to rubbing fingers CN IX, X: Palate elevates symmetrically. Phonation is normal. CN XI: Head turning and shoulder shrug are intact CN XII: Tongue is midline with normal movements and no atrophy.  MOTOR: There is no pronator drift of out-stretched arms. Muscle bulk and tone are normal. Muscle strength is normal.  REFLEXES: Reflexes are 2+ and symmetric at the biceps, triceps, knees, and ankles. Plantar responses are flexor.  SENSORY: Intact to light touch, pinprick, position sense, and vibration sense are intact in fingers and toes.  COORDINATION: Rapid alternating movements and fine finger movements are intact. There is no dysmetria on finger-to-nose and heel-knee-shin.    GAIT/STANCE: Posture is normal. Gait is steady with normal steps, base, arm swing, and turning. Heel and toe walking are normal. Tandem gait is normal.  Romberg is absent.   DIAGNOSTIC DATA (LABS, IMAGING, TESTING) - I reviewed patient records, labs, notes, testing and imaging myself where available.   ASSESSMENT AND PLAN  Meghan Hickman is a 65 y.o. female   Slurred speech  Most related to her denture,  No evidence of muscle weakness, neuromuscular junctional disorders  Bilateral tinnitus  No structural lesions found  Small vessel disease  She has multiple vascular risk factors, hypertension, diabetes, hyperlipidemia,  I have advised her to take baby aspirin daily, keep well hydration   Levert Feinstein, M.D. Ph.D.  [facility] [address] [place], [place] [address] Ph: [phone] Fax: [phone]  CC: Collene Gobble, MD

## 2015-12-24 DIAGNOSIS — Z0271 Encounter for disability determination: Secondary | ICD-10-CM

## 2016-01-05 NOTE — Progress Notes (Signed)
[facility] [address], [place] [address] [phone]  Date:  12/18/2015   Name:  Meghan Hickman   DOB:  1950/11/13   MRN:  [id number]  PCP:  Lucilla Edin, MD   Chief Complaint  Patient presents with  . Sore Throat    x 2 days ago  . Facial Pain  . Fever    History of Present Illness:  Meghan Hickman is a 65 y.o. female patient who presents to [facility] FOR CC of 2 days of sore throat, facial pain and fever over 1 week.  Cold-like symptoms of congestion, rhinorrhea, that progressively worsened over the last 2 days.  She has developed sore throat and fever. Current fever.  Nasal mucus is thick and colored.      Patient Active Problem List   Diagnosis Date Noted  . Small vessel disease, cerebrovascular 12/22/2015  . Slurred speech 07/24/2015  . Abnormal CT of brain 07/08/2015  . Speech abnormality 06/20/2015  . Thyroid nodule 06/20/2015  . S/P laparoscopic cholecystectomy 12/05/2014  . Heart murmur 02/11/2013  . Type II or unspecified type diabetes mellitus without mention of complication, not stated as uncontrolled 02/09/2013  . Other and unspecified hyperlipidemia 02/09/2013    Past Medical History  Diagnosis Date  . Arthritis   . Diabetes mellitus without complication (HCC)   . Hyperlipidemia   . Heart murmur   . Hypertension   . Wears glasses   . Headache   . GERD (gastroesophageal reflux disease)   . Speech problem     Past Surgical History  Procedure Laterality Date  . Shoulder arthroscopy with subacromial decompression and bicep tendon repair Right 04/23/2013    Procedure: SHOULDER ARTHROSCOPY WITH SUBACROMIAL DECOMPRESSION AND BICEP TENDON TENOTOMY;  Surgeon: Mable Paris, MD;  Location: [facility];  Service: Orthopedics;  Laterality: Right;  . Cholecystectomy N/A 12/05/2014    Procedure: LAPAROSCOPIC CHOLECYSTECTOMY;  Surgeon: Emelia Loron, MD;  Location: [facility] OR;  Service: General;  Laterality: N/A;     Social History  Substance Use Topics  . Smoking status: Former Smoker -- 0.20 packs/day for 10 years    Types: Cigarettes    Start date: 04/30/2012    Quit date: 08/16/2012  . Smokeless tobacco: None  . Alcohol Use: No    Family History  Problem Relation Age of Onset  . Stroke Mother   . Hypertension Mother   . Diabetes Mother   . Breast cancer Mother   . Heart disease Brother   . Healthy Father     No Known Allergies  Medication list has been reviewed and updated.  Current Outpatient Prescriptions on File Prior to Visit  Medication Sig Dispense Refill  . almotriptan (AXERT) 12.5 MG tablet Take 12.5 mg by mouth daily as needed for migraine.   4  . aspirin 81 MG tablet Take 81 mg by mouth daily.    [name] Kitchen atorvastatin (LIPITOR) 40 MG tablet Take 1 tablet (40 mg total) by mouth daily at 6 PM. 90 tablet 3  . cyclobenzaprine (FLEXERIL) 5 MG tablet Take 1 tablet (5 mg total) by mouth 2 (two) times daily as needed. 30 tablet 0  . divalproex (DEPAKOTE ER) 500 MG 24 hr tablet Take 1 tablet daily for depression. (Patient taking differently: Take 500 mg by mouth daily. ) 30 tablet 11  . estradiol (ESTRACE) 0.5 MG tablet Take 0.5 mg by mouth daily.  0  . gabapentin (NEURONTIN) 100 MG capsule  Take 100 mg by mouth 3 (three) times daily.    [name] Kitchen HYDROcodone-acetaminophen (NORCO) 10-325 MG tablet Take 1 tablet by mouth every 8 (eight) hours as needed. Stop tramadol, take this with stool softener, no extra tylenol with this (Patient taking differently: Take 1 tablet by mouth every 8 (eight) hours as needed for moderate pain. Stop tramadol, take this with stool softener, no extra tylenol with this) 30 tablet 0  . ibuprofen (ADVIL,MOTRIN) 200 MG tablet Take 200 mg by mouth every 6 (six) hours as needed for mild pain or moderate pain.    [name] Kitchen losartan-hydrochlorothiazide (HYZAAR) 50-12.5 MG per tablet Take 1 tablet by mouth daily. 90 tablet 3  . metFORMIN (GLUCOPHAGE) 500 MG tablet Take 1 tablet (500 mg  total) by mouth daily. 90 tablet 3  . Multiple Vitamins-Minerals (MULTIVITAMIN & MINERAL PO) Take 1 tablet by mouth daily.    . ondansetron (ZOFRAN ODT) 4 MG disintegrating tablet Take 1 tablet (4 mg total) by mouth every 8 (eight) hours as needed for nausea or vomiting. 10 tablet 1  . ranitidine (ZANTAC) 150 MG tablet Take 1 tablet (150 mg total) by mouth 2 (two) times daily. For 2 weeks then as needed 60 tablet 0  . temazepam (RESTORIL) 30 MG capsule TAKE 1 CAPSULE BY MOUTH AT BEDTIME AS NEEDED FOR SLEEP 30 capsule 3  . HYDROcodone-acetaminophen (NORCO/VICODIN) 5-325 MG tablet Take 1-2 tablets by mouth every 6 (six) hours as needed for moderate pain. 10 tablet 0  . traMADol (ULTRAM) 50 MG tablet Take 1 tablet (50 mg total) by mouth every 6 (six) hours as needed. 30 tablet 2   No current facility-administered medications on file prior to visit.    ROS ROS otherwise unremarkable unless listed above.    Physical Examination: BP 120/80 mmHg  Pulse 118  Temp(Src) 100.1 F (37.8 C) (Oral)  Resp 18  Ht 4\' 11"  (1.499 m)  Wt 143 lb (64.864 kg)  BMI 28.87 kg/m2  SpO2 95% Ideal Body Weight: Weight in (lb) to have BMI = 25: 123.5  Physical Exam  Constitutional: She is oriented to person, place, and time. She appears well-developed and well-nourished. No distress.  HENT:  Head: Normocephalic and atraumatic.  Right Ear: Tympanic membrane, external ear and ear canal normal.  Left Ear: Tympanic membrane, external ear and ear canal normal.  Nose: Mucosal edema and rhinorrhea present. Right sinus exhibits no maxillary sinus tenderness and no frontal sinus tenderness. Left sinus exhibits no maxillary sinus tenderness and no frontal sinus tenderness.  Mouth/Throat: No uvula swelling. No oropharyngeal exudate, posterior oropharyngeal edema or posterior oropharyngeal erythema.  Eyes: Conjunctivae and EOM are normal. Pupils are equal, round, and reactive to light.  Cardiovascular: Normal rate and  regular rhythm.  Exam reveals no gallop, no distant heart sounds and no friction rub.   No murmur heard. Pulmonary/Chest: Effort normal. No respiratory distress. She has no decreased breath sounds. She has no wheezes. She has no rhonchi.  Lymphadenopathy:       Head (right side): No submandibular, no tonsillar, no preauricular and no posterior auricular adenopathy present.       Head (left side): No submandibular, no tonsillar, no preauricular and no posterior auricular adenopathy present.  Neurological: She is alert and oriented to person, place, and time.  Skin: She is not diaphoretic.  Psychiatric: She has a normal mood and affect. Her behavior is normal.   Results for orders placed or performed in visit on 12/18/15  Culture, Group A Strep  Result Value Ref Range   Organism ID, Bacteria Normal Upper Respiratory Flora    Organism ID, Bacteria No Beta Hemolytic Streptococci Isolated   POCT rapid strep A  Result Value Ref Range   Rapid Strep A Screen Negative Negative  POCT CBC  Result Value Ref Range   WBC 16.2 (A) 4.6 - 10.2 K/uL   Lymph, poc 2.3 0.6 - 3.4   POC LYMPH PERCENT 14.4 10 - 50 %L   MID (cbc) 0.2 0 - 0.9   POC MID % 1.4 0 - 12 %M   POC Granulocyte 13.6 (A) 2 - 6.9   Granulocyte percent 84.2 (A) 37 - 80 %G   RBC 4.45 4.04 - 5.48 M/uL   Hemoglobin 12.4 12.2 - 16.2 g/dL   HCT, POC 16.136.6 (A) 09.637.7 - 47.9 %   MCV 82.1 80 - 97 fL   MCH, POC 27.8 27 - 31.2 pg   MCHC 33.8 31.8 - 35.4 g/dL   RDW, POC 04.515.4 %   Platelet Count, POC 380 142 - 424 K/uL   MPV 8.1 0 - 99.8 fL     Assessment and Plan: Meghan Hickman is a 65 y.o. female who is here today for cc of sinus pain and congestion. Treating with abx at this time.  Supportive listed below.  Alarming sxs given to warrant immediate return. Subacute sinusitis, unspecified location - Plan: POCT CBC, ibuprofen (ADVIL,MOTRIN) tablet 600 mg, Guaifenesin (MUCINEX MAXIMUM STRENGTH) 1200 MG TB12, amoxicillin-clavulanate (AUGMENTIN)  875-125 MG tablet  Throat pain - Plan: POCT rapid strep A, Culture, Group A Strep  Trena PlattStephanie English, PA-C [facility] 01/05/2016 3:26 PM

## 2016-01-15 DIAGNOSIS — H35343 Macular cyst, hole, or pseudohole, bilateral: Secondary | ICD-10-CM | POA: Diagnosis not present

## 2016-01-15 DIAGNOSIS — E119 Type 2 diabetes mellitus without complications: Secondary | ICD-10-CM | POA: Diagnosis not present

## 2016-01-15 DIAGNOSIS — H25813 Combined forms of age-related cataract, bilateral: Secondary | ICD-10-CM | POA: Diagnosis not present

## 2016-01-19 DIAGNOSIS — G43719 Chronic migraine without aura, intractable, without status migrainosus: Secondary | ICD-10-CM | POA: Diagnosis not present

## 2016-01-23 DIAGNOSIS — K219 Gastro-esophageal reflux disease without esophagitis: Secondary | ICD-10-CM | POA: Diagnosis not present

## 2016-01-23 DIAGNOSIS — R197 Diarrhea, unspecified: Secondary | ICD-10-CM | POA: Diagnosis not present

## 2016-02-12 ENCOUNTER — Encounter (INDEPENDENT_AMBULATORY_CARE_PROVIDER_SITE_OTHER): Payer: Self-pay

## 2016-02-12 ENCOUNTER — Ambulatory Visit (INDEPENDENT_AMBULATORY_CARE_PROVIDER_SITE_OTHER): Payer: Federal, State, Local not specified - PPO | Admitting: Psychiatry

## 2016-02-12 ENCOUNTER — Encounter (HOSPITAL_COMMUNITY): Payer: Self-pay | Admitting: Psychiatry

## 2016-02-12 VITALS — BP 140/80 | HR 92 | Ht 60.0 in | Wt 145.4 lb

## 2016-02-12 DIAGNOSIS — F331 Major depressive disorder, recurrent, moderate: Secondary | ICD-10-CM | POA: Diagnosis not present

## 2016-02-12 MED ORDER — ESCITALOPRAM OXALATE 10 MG PO TABS: ORAL_TABLET | ORAL | Status: DC

## 2016-02-12 NOTE — Progress Notes (Signed)
[facility] Initial Assessment Note  Meghan Hickman [id number] 65 y.o.  02/12/2016 9:39 AM  Chief Complaint:  I have depression.  My doctor asked me to see psychiatrist.  History of Present Illness:  Patient is 65 year old African-American, employed, single female who is referred from her primary care physician Dr. Ivar Bury for the management of depression.  Patient told she is suffering from depression for more than 3 years when her 80 year old.mother died in nursing home due to complication of stroke.  She admitted missing her mother and has not dealt with the loss.  She admitted poor sleep, racing thoughts, irritable mood, lack of motivation and energy.  She admitted crying spells and decreased energy.  Though she denies any hallucination, paranoia, self abusive behavior but admitted anhedonia, feeling of hopelessness and worthlessness.  She admitted getting easily frustrated and get irritable around people.  She regret that she should have done more about her mother.  Patient brought her mother from her home town before she died in nursing home.  She wanted to take care of her but regret that she did not spend much time with her.  Patient also endorsed multiple health issues including hypertension, hyperlipidemia, diabetes and lately thinning of slurred speech.  She has seen neurologist and had a complete workup recommended to take aspirin as did not find any obvious cause for slurred speech.  Patient endorse some time difficulty remembering things and admitted difficulty organizing her thinking.  She is very concerned about her physical health and even though she find out that she does not have a stroke she continued to worried about her future.  She has headaches and [facility]  She used to get Effexor for her headaches but it was changed to Depakote recently because her primary care physician felt Effexor causing increased blood  pressure.  Patient also taking gabapentin 100 mg 3 times a day and temazepam 30 mg at bedtime for insomnia as needed.  Patient endorse fatigue, easily tired, excessive worry about her health.  She denies any active or passive suicidal thoughts, mania, psychosis or any aggressive behavior.  Her appetite is fair.  Her vital signs are okay.  Currently she is not seeing any therapist.  Suicidal Ideation: No Plan Formed: No Patient has means to carry out plan: No  Homicidal Ideation: No Plan Formed: No Patient has means to carry out plan: No  Past Psychiatric History/Hospitalization(s): Patient denies any history of psychiatric inpatient treatment, suicidal attempt, mania, psychosis or any hallucination.  She denies any history of PTSD, OCD or panic attacks.  She [facility] which was switched to Depakote due to high blood pressure from Effexor. Anxiety: No Bipolar Disorder: No Depression: Yes Mania: No Psychosis: No Schizophrenia: No Personality Disorder: No Hospitalization for psychiatric illness: No History of Electroconvulsive Shock Therapy: No Prior Suicide Attempts: No  Family History; Patient denies any family history of psychiatric illness.  Medical History; Patient has headaches, hypertension, diabetes mellitus, hyperlipidemia, slurred speech.  Her primary care physician is Dr. Ivar Bury.  She also see neurology.  Traumatic brain injury: Patient denies any history of traumatic brain injury.  Education and Work History; Patient has college education.  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  She never married.  She has no children.  Her  parents are deceased.  Her mother died 4 years ago.  Her father died in 43.  Patient has 2 living brother who lives out of town.  Patient lives by herself.  Legal History; Patient denies any  legal issues.  History Of Abuse; Patient denies any history of abuse.  Substance Abuse History; Patient denies drinking or using any illegal substances.  Review of Systems: Psychiatric: Agitation: Irritability Hallucination: No Depressed Mood: Yes Insomnia: Yes Hypersomnia: No Altered Concentration: No Feels Worthless: Yes Grandiose Ideas: No Belief In Special Powers: No New/Increased Substance Abuse: No Compulsions: No  Neurologic: Headache: Yes Seizure: No Paresthesias: No   Outpatient Encounter Prescriptions as of 02/12/2016  Medication Sig  . almotriptan (AXERT) 12.5 MG tablet Take 12.5 mg by mouth daily as needed for migraine.   [name] Kitchen aspirin 81 MG tablet Take 81 mg by mouth daily.  [name] Kitchen atorvastatin (LIPITOR) 40 MG tablet Take 1 tablet (40 mg total) by mouth daily at 6 PM.  . divalproex (DEPAKOTE ER) 500 MG 24 hr tablet Take 1 tablet daily for depression. (Patient taking differently: Take 500 mg by mouth daily. )  . estradiol (ESTRACE) 0.5 MG tablet Take 0.5 mg by mouth daily.  [name] Kitchen gabapentin (NEURONTIN) 100 MG capsule Take 100 mg by mouth 3 (three) times daily.  [name] Kitchen losartan-hydrochlorothiazide (HYZAAR) 50-12.5 MG per tablet Take 1 tablet by mouth daily.  . metFORMIN (GLUCOPHAGE) 500 MG tablet Take 1 tablet (500 mg total) by mouth daily.  . ondansetron (ZOFRAN ODT) 4 MG disintegrating tablet Take 1 tablet (4 mg total) by mouth every 8 (eight) hours as needed for nausea or vomiting.  . ranitidine (ZANTAC) 150 MG tablet Take 1 tablet (150 mg total) by mouth 2 (two) times daily. For 2 weeks then as needed  . temazepam (RESTORIL) 30 MG capsule TAKE 1 CAPSULE BY MOUTH AT BEDTIME AS NEEDED FOR SLEEP  . traMADol (ULTRAM) 50 MG tablet Take 1 tablet (50 mg total) by mouth every 6 (six) hours as needed.  . [DISCONTINUED] cyclobenzaprine (FLEXERIL) 5 MG tablet Take 1 tablet (5 mg total) by mouth 2 (two) times daily as needed.  [name] Kitchen escitalopram (LEXAPRO) 10 MG tablet Take 1/2 tab daily for 1  week and than 1 tab daily  . Guaifenesin (MUCINEX MAXIMUM STRENGTH) 1200 MG TB12 Take 1 tablet (1,200 mg total) by mouth every 12 (twelve) hours as needed. (Patient not taking: Reported on 02/12/2016)  . ibuprofen (ADVIL,MOTRIN) 200 MG tablet Take 200 mg by mouth every 6 (six) hours as needed for mild pain or moderate pain.  . Multiple Vitamins-Minerals (MULTIVITAMIN & MINERAL PO) Take 1 tablet by mouth daily.  . [DISCONTINUED] HYDROcodone-acetaminophen (NORCO) 10-325 MG tablet Take 1 tablet by mouth every 8 (eight) hours as needed. Stop tramadol, take this with stool softener, no extra tylenol with this (Patient not taking: Reported on 02/12/2016)  . [DISCONTINUED] HYDROcodone-acetaminophen (NORCO/VICODIN) 5-325 MG tablet Take 1-2 tablets by mouth every 6 (six) hours as needed for moderate pain. (Patient not taking: Reported on 02/12/2016)   No facility-administered encounter medications on file as of 02/12/2016.    Recent Results (from the past 2160 hour(s))  POCT urinalysis dipstick     Status: Abnormal   Collection Time: 11/30/15  9:40 AM  Result Value Ref Range   Color, UA yellow yellow   Clarity, UA hazy (A) clear   Glucose, UA negative negative   Bilirubin, UA moderate (A) negative   Ketones, POC UA moderate (40) (A) negative   Spec Grav,  UA 1.020    Blood, UA small (A) negative   pH, UA 5.5    Protein Ur, POC =100 (A) negative   Urobilinogen, UA 4.0    Nitrite, UA Negative Negative   Leukocytes, UA Negative Negative  POCT Microscopic Urinalysis ([facility])     Status: Abnormal   Collection Time: 11/30/15  9:40 AM  Result Value Ref Range   WBC,UR,HPF,POC Few (A) None WBC/hpf   RBC,UR,HPF,POC Few (A) None RBC/hpf   Bacteria Few (A) None, Too numerous to count   Mucus Present (A) Absent   Epithelial Cells, UR Per Microscopy Few (A) None, Too numerous to count cells/hpf  POCT CBC     Status: Abnormal   Collection Time: 11/30/15 10:34 AM  Result Value Ref Range   WBC 12.0 (A) 4.6 - 10.2  K/uL   Lymph, poc 1.7 0.6 - 3.4   POC LYMPH PERCENT 14.0 10 - 50 %L   MID (cbc) 0.3 0 - 0.9   POC MID % 2.3 0 - 12 %M   POC Granulocyte 10.0 (A) 2 - 6.9   Granulocyte percent 83.7 (A) 37 - 80 %G   RBC 4.72 4.04 - 5.48 M/uL   Hemoglobin 13.2 12.2 - 16.2 g/dL   HCT, POC 39.0 37.7 - 47.9 %   MCV 82.7 80 - 97 fL   MCH, POC 27.9 27 - 31.2 pg   MCHC 33.8 31.8 - 35.4 g/dL   RDW, POC 14.0 %   Platelet Count, POC 255 142 - 424 K/uL   MPV 8.3 0 - 99.8 fL  POCT glucose (manual entry)     Status: Abnormal   Collection Time: 11/30/15 10:36 AM  Result Value Ref Range   POC Glucose 110 (A) 70 - 99 mg/dl  Comprehensive metabolic panel     Status: Abnormal   Collection Time: 11/30/15 11:50 AM  Result Value Ref Range   Sodium 145 135 - 145 mmol/L   Potassium 3.5 3.5 - 5.1 mmol/L   Chloride 109 101 - 111 mmol/L   CO2 25 22 - 32 mmol/L   Glucose, Bld 100 (H) 65 - 99 mg/dL   BUN 13 6 - 20 mg/dL   Creatinine, Ser 0.78 0.44 - 1.00 mg/dL   Calcium 8.7 (L) 8.9 - 10.3 mg/dL   Total Protein 6.6 6.5 - 8.1 g/dL   Albumin 2.9 (L) 3.5 - 5.0 g/dL   AST 20 15 - 41 U/L   ALT 16 14 - 54 U/L   Alkaline Phosphatase 71 38 - 126 U/L   Total Bilirubin 1.0 0.3 - 1.2 mg/dL   GFR calc non Af Amer >60 >60 mL/min   GFR calc Af Amer >60 >60 mL/min    Comment: (NOTE) The eGFR has been calculated using the CKD EPI equation. This calculation has not been validated in all clinical situations. eGFR's persistently <60 mL/min signify possible Chronic Kidney Disease.    Anion gap 11 5 - 15  Lipase, blood     Status: None   Collection Time: 11/30/15 11:50 AM  Result Value Ref Range   Lipase 21 11 - 51 U/L  CBC with Differential/Platelet     Status: Abnormal   Collection Time: 11/30/15 11:50 AM  Result Value Ref Range   WBC 10.7 (H) 4.0 - 10.5 K/uL   RBC 4.47 3.87 - 5.11 MIL/uL   Hemoglobin 11.9 (L) 12.0 - 15.0 g/dL   HCT 37.7 36.0 - 46.0 %   MCV 84.3 78.0 - 100.0  fL   MCH 26.6 26.0 - 34.0 pg   MCHC 31.6 30.0 -  36.0 g/dL   RDW 13.8 11.5 - 15.5 %   Platelets 279 150 - 400 K/uL   Neutrophils Relative % 78 %   Neutro Abs 8.4 (H) 1.7 - 7.7 K/uL   Lymphocytes Relative 14 %   Lymphs Abs 1.5 0.7 - 4.0 K/uL   Monocytes Relative 7 %   Monocytes Absolute 0.7 0.1 - 1.0 K/uL   Eosinophils Relative 1 %   Eosinophils Absolute 0.1 0.0 - 0.7 K/uL   Basophils Relative 0 %   Basophils Absolute 0.0 0.0 - 0.1 K/uL  Urinalysis, Routine w reflex microscopic (not at [facility])     Status: Abnormal   Collection Time: 11/30/15  1:03 PM  Result Value Ref Range   Color, Urine YELLOW YELLOW   APPearance CLEAR CLEAR   Specific Gravity, Urine 1.028 1.005 - 1.030   pH 5.5 5.0 - 8.0   Glucose, UA NEGATIVE NEGATIVE mg/dL   Hgb urine dipstick NEGATIVE NEGATIVE   Bilirubin Urine SMALL (A) NEGATIVE   Ketones, ur 40 (A) NEGATIVE mg/dL   Protein, ur NEGATIVE NEGATIVE mg/dL   Nitrite NEGATIVE NEGATIVE   Leukocytes, UA NEGATIVE NEGATIVE    Comment: MICROSCOPIC NOT DONE ON URINES WITH NEGATIVE PROTEIN, BLOOD, LEUKOCYTES, NITRITE, OR GLUCOSE <1000 mg/dL.  POCT CBC     Status: Abnormal   Collection Time: 12/03/15  3:23 PM  Result Value Ref Range   WBC 7.0 4.6 - 10.2 K/uL   Lymph, poc 2.3 0.6 - 3.4   POC LYMPH PERCENT 32.9 10 - 50 %L   MID (cbc) 0.3 0 - 0.9   POC MID % 5.0 0 - 12 %M   POC Granulocyte 4.3 2 - 6.9   Granulocyte percent 62.1 37 - 80 %G   RBC 4.29 4.04 - 5.48 M/uL   Hemoglobin 12.0 (A) 12.2 - 16.2 g/dL   HCT, POC 35.0 (A) 37.7 - 47.9 %   MCV 81.6 80 - 97 fL   MCH, POC 27.9 27 - 31.2 pg   MCHC 34.2 31.8 - 35.4 g/dL   RDW, POC 13.4 %   Platelet Count, POC 328 142 - 424 K/uL   MPV 8.3 0 - 99.8 fL  POCT rapid strep A     Status: None   Collection Time: 12/18/15  6:39 PM  Result Value Ref Range   Rapid Strep A Screen Negative Negative  Culture, Group A Strep     Status: None   Collection Time: 12/18/15  6:44 PM  Result Value Ref Range   Organism ID, Bacteria Normal Upper Respiratory Flora    Organism ID,  Bacteria No Beta Hemolytic Streptococci Isolated   POCT CBC     Status: Abnormal   Collection Time: 12/18/15  7:17 PM  Result Value Ref Range   WBC 16.2 (A) 4.6 - 10.2 K/uL   Lymph, poc 2.3 0.6 - 3.4   POC LYMPH PERCENT 14.4 10 - 50 %L   MID (cbc) 0.2 0 - 0.9   POC MID % 1.4 0 - 12 %M   POC Granulocyte 13.6 (A) 2 - 6.9   Granulocyte percent 84.2 (A) 37 - 80 %G   RBC 4.45 4.04 - 5.48 M/uL   Hemoglobin 12.4 12.2 - 16.2 g/dL   HCT, POC 36.6 (A) 37.7 - 47.9 %   MCV 82.1 80 - 97 fL   MCH, POC 27.8 27 - 31.2 pg  MCHC 33.8 31.8 - 35.4 g/dL   RDW, POC 15.4 %   Platelet Count, POC 380 142 - 424 K/uL   MPV 8.1 0 - 99.8 fL      Constitutional:  BP 140/80 mmHg  Pulse 92  Ht 5' (1.524 m)  Wt 145 lb 6.4 oz (65.953 kg)  BMI 28.40 kg/m2   Musculoskeletal: Strength & Muscle Tone: within normal limits Gait & Station: normal Patient leans: N/A  Psychiatric Specialty Exam: General Appearance: Casual and Shy  Eye Contact::  Fair  Speech:  Slow  Volume:  Decreased  Mood:  Depressed and Dysphoric  Affect:  Flat  Thought Process:  Coherent  Orientation:  Full (Time, Place, and Person)  Thought Content:  Rumination  Suicidal Thoughts:  No  Homicidal Thoughts:  No  Memory:  Immediate;   Fair Recent;   Fair Remote;   Fair  Judgement:  Fair  Insight:  Fair  Psychomotor Activity:  Decreased  Concentration:  Fair  Recall:  [employer] of Knowledge:  Fair  Language:  Good  Akathisia:  No  Handed:  Right  AIMS (if indicated):     Assets:  Communication Skills Desire for Improvement Housing Transportation  ADL's:  Intact  Cognition:  WNL  Sleep:        Review of Psycho-Social Stressors (1), Review or order clinical lab tests (1), Decision to obtain old records (1), Review and summation of old records (2), Established Problem, Worsening (2), New Problem, with no additional work-up planned (3), Review of Medication Regimen & Side Effects (2) and Review of New Medication or Change  in Dosage (2)  Assessment: Axis I: Major depressive disorder, recurrent moderate.  Grief  Axis II: Deferred  Axis III:  Past Medical History  Diagnosis Date  . Arthritis   . Diabetes mellitus without complication ([place])   . Hyperlipidemia   . Heart murmur   . Hypertension   . Wears glasses   . Headache   . GERD (gastroesophageal reflux disease)   . Speech problem      Plan:  I review her symptoms, history, current medication, collateral information from other providers, current medication and blood work.  Patient is experiencing depression.  At this time she is not taking any antidepressant.  Recommended to discontinue Depakote as she does not feel any improvement.  Start Lexapro 10 mg daily to help with depressive symptoms.  She will continue gabapentin 100 mg prescribed by her primary care physician and temazepam 30 mg as needed for insomnia.  I discussed medication side effects.  I would also scheduled her to see therapist for grief counseling.  Encouraged to keep appointment with neurology for her slurred speech.  Recommended to call us back if she has any question, concern if she feels worsening of the symptom.  Discuss safety plan that anytime having active suicidal thoughts or homicidal thoughts and she need to call 911 or go to the local emergency room.  Follow-up in 6 weeks.  Elea Holtzclaw T., MD 02/12/2016

## 2016-02-13 ENCOUNTER — Other Ambulatory Visit: Payer: Self-pay | Admitting: Family Medicine

## 2016-02-16 DIAGNOSIS — H2512 Age-related nuclear cataract, left eye: Secondary | ICD-10-CM | POA: Diagnosis not present

## 2016-03-01 DIAGNOSIS — H25812 Combined forms of age-related cataract, left eye: Secondary | ICD-10-CM | POA: Diagnosis not present

## 2016-03-01 DIAGNOSIS — H2512 Age-related nuclear cataract, left eye: Secondary | ICD-10-CM | POA: Diagnosis not present

## 2016-03-02 ENCOUNTER — Other Ambulatory Visit: Payer: Self-pay | Admitting: Emergency Medicine

## 2016-03-02 ENCOUNTER — Other Ambulatory Visit: Payer: Self-pay | Admitting: Family Medicine

## 2016-03-02 DIAGNOSIS — M25511 Pain in right shoulder: Secondary | ICD-10-CM

## 2016-03-02 DIAGNOSIS — R1013 Epigastric pain: Secondary | ICD-10-CM

## 2016-03-04 ENCOUNTER — Other Ambulatory Visit: Payer: Self-pay | Admitting: Emergency Medicine

## 2016-03-04 NOTE — Telephone Encounter (Signed)
Please call patient and be sure she has taken this before. she is on an SSRI and there is a  risk of serotonin syndrome when these drugs are take together. Route it back to me after you have spoken with her

## 2016-03-04 NOTE — Telephone Encounter (Signed)
Called pt and she stated that she took the Lexapro and tramadol together twice last week and she did not notice any Sxs. I went over a list of Sxs to watch for and she agreed to stop the tramadol and notify us if she experiences any Sxs. Dr Cleta Alberts, do you want to RF?

## 2016-03-05 NOTE — Telephone Encounter (Signed)
Called in RX. Pt had stated that her pharm will notify her when ready.

## 2016-03-09 DIAGNOSIS — H2511 Age-related nuclear cataract, right eye: Secondary | ICD-10-CM | POA: Diagnosis not present

## 2016-03-15 ENCOUNTER — Ambulatory Visit (INDEPENDENT_AMBULATORY_CARE_PROVIDER_SITE_OTHER): Payer: Federal, State, Local not specified - PPO | Admitting: Clinical

## 2016-03-15 ENCOUNTER — Encounter (HOSPITAL_COMMUNITY): Payer: Self-pay | Admitting: Clinical

## 2016-03-15 DIAGNOSIS — F331 Major depressive disorder, recurrent, moderate: Secondary | ICD-10-CM | POA: Diagnosis not present

## 2016-03-15 NOTE — Progress Notes (Signed)
Comprehensive Clinical Assessment (CCA) Note  03/15/2016 Meghan GardenerLinda M Hickman [id number]  Visit Diagnosis:      ICD-9-CM ICD-10-CM   1. Moderate episode of recurrent major depressive disorder (HCC) 296.32 F33.1       CCA Part One  Part One has been completed on paper by the patient.  (See scanned document in Chart Review)  CCA Part Two A  Intake/Chief Complaint:  CCA Intake With Chief Complaint CCA Part Two Date: 03/15/16 CCA Part Two Time: 0906 Chief Complaint/Presenting Problem: Depression, grief  Patients Currently Reported Symptoms/Problems: Depression began in 3640's, got worse a few years ago when mother passed away (4 years ago of stroke) Individual's Strengths: "Can't think anything." Individual's Preferences: "I just want to be not misserable, I want to beable to find a way to function my daily activities." Type of Services Patient Feels Are Needed: Individual Therapy   Mental Health Symptoms Depression:  Depression: Change in energy/activity, Difficulty Concentrating, Fatigue, Hopelessness, Irritability, Sleep (too much or little) (fittful sleep - sleeping too long)  Mania:  Mania: N/A  Anxiety:   Anxiety: Worrying, Difficulty concentrating, Fatigue, Irritability, Sleep, Tension  Psychosis:  Psychosis: N/A  Trauma:  Trauma: N/A  Obsessions:  Obsessions: N/A  Compulsions:  Compulsions: N/A  Inattention:  Inattention: N/A  Hyperactivity/Impulsivity:  Hyperactivity/Impulsivity: N/A  Oppositional/Defiant Behaviors:  Oppositional/Defiant Behaviors: N/A  Borderline Personality:  Emotional Irregularity: Chronic feelings of emptiness, Mood lability  Other Mood/Personality Symptoms:      Mental Status Exam Appearance and self-care  Stature:  Stature: Small  Weight:  Weight: Average weight  Clothing:  Clothing: Casual  Grooming:  Grooming: Normal  Cosmetic use:  Cosmetic Use: None  Posture/gait:  Posture/Gait: Normal  Motor activity:  Motor Activity: Restless  Sensorium   Attention:  Attention: Normal  Concentration:  Concentration: Normal  Orientation:  Orientation: X5  Recall/memory:  Recall/Memory: Normal  Affect and Mood  Affect:  Affect: Depressed  Mood:  Mood: Depressed  Relating  Eye contact:  Eye Contact: Normal  Facial expression:  Facial Expression: Depressed  Attitude toward examiner:  Attitude Toward Examiner: Cooperative  Thought and Language  Speech flow: Speech Flow: Soft  Thought content:  Thought Content: Appropriate to mood and circumstances  Preoccupation:  Preoccupations: Other (Comment) (Grief)  Hallucinations:     Organization:     Company secretaryxecutive Functions  Fund of Knowledge:  Fund of Knowledge: Average  Intelligence:  Intelligence: Average  Abstraction:  Abstraction: Normal  Judgement:  Judgement: Fair  Dance movement psychotherapisteality Testing:  Reality Testing: Realistic  Insight:  Insight: Fair  Decision Making:  Decision Making: Normal  Social Functioning  Social Maturity:  Social Maturity: Isolates  Social Judgement:  Social Judgement: Naive  Stress  Stressors:  Stressors: Grief/losses  Coping Ability:  Coping Ability: Overwhelmed, Horticulturist, commercialxhausted  Skill Deficits:     Supports:      Family and Psychosocial History: Family history Marital status: Single Are you sexually active?: No What is your sexual orientation?: Heterosexual  Has your sexual activity been affected by drugs, alcohol, medication, or emotional stress?: No Does patient have children?: No  Childhood History:  Childhood History By whom was/is the patient raised?: Grandparents, Both parents Additional childhood history information: Lived with my  Grandma and parents lived down the road and I saw them everyday. I had sheltered childhood. I wasn't exposed to any harmful or bad things. Even as an adult I stray from movies that have violence.  Description of patient's relationship with caregiver when they were a child:  Grandmother - she was 52 when I was a kid. It was very close.  Granfather - The same. What ever I wanted they gave me.  Mother - it was good but I was not as close to her then as I was an adult. Dad was close too - he died in my 5's Patient's description of current relationship with people who raised him/her: Grandparents passed, Dad passed in car accident when I was in my 20's. Mother passed 4 years ago from a stroke How were you disciplined when you got in trouble as a child/adolescent?: not often Does patient have siblings?: Yes Number of Siblings: 3 Description of patient's current relationship with siblings: Meghan Hickman passed of stroke at age 39. 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  How long has patient been employed?: 30 years  Patient's job has been impacted by current illness: Yes Describe how patient's job has been impacted: I missed a lot of days  What is the longest time patient has a held a job?: 30 years  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  Has patient ever been in the [employer]?: No Are There Guns or Other Weapons in Your Home?: No  Education: Education Name of [employer]: Meghan Hickman, Meghan Hickman Did Garment/textile technologist From [employer]?: Yes Did You Attend College?: Yes What Type of College Degree Do you Have?: BS Bussiness  Did You Attend Graduate School?: No Did You Have An Individualized Education Program (IIEP): No Did You Have Any Difficulty At School?: No  Religion: Religion/Spirituality Are You  A Religious Person?: Yes What is Your Religious Affiliation?: Baptist How Might This Affect Treatment?: "I don't think it will."  Leisure/Recreation: Leisure / Recreation Leisure and Hobbies: "plants, I plant trees bushes etc."  Exercise/Diet: Exercise/Diet Do You Exercise?: Yes What Type of Exercise Do You Do?: Run/Walk How Many Times a Week Do You Exercise?: 1-3 times a week Have You Gained or Lost A Significant Amount of Weight in the Past Six Months?: No Do You Follow a Special Diet?: No Do You Have Any Trouble Sleeping?: Yes Explanation of Sleeping Difficulties: sleep too much but fitful. Hard time getting to sleep and a hard time waking up once I get into a deep sleep."  CCA Part Two C  Alcohol/Drug Use: Alcohol / Drug Use Pain Medications: See Chart Prescriptions: See Chart Over the Counter: See Chart History of alcohol / drug use?: No history of alcohol / drug abuse                      CCA Part Three  ASAM's:  Six Dimensions of Multidimensional Assessment  Dimension 1:  Acute Intoxication and/or Withdrawal Potential:     Dimension 2:  Biomedical Conditions and Complications:     Dimension 3:  Emotional, Behavioral, or Cognitive Conditions and Complications:     Dimension 4:  Readiness to Change:     Dimension 5:  Relapse, Continued use, or Continued Problem Potential:     Dimension 6:  Recovery/Living Environment:      Substance use Disorder (SUD)    Social Function:  Social Functioning Social Maturity: Isolates Social Judgement: Naive  Stress:  Stress Stressors: Grief/losses Coping Ability: Overwhelmed, Exhausted Patient Takes Medications The Way The Doctor Instructed?: Yes Priority Risk: Low Acuity  Risk Assessment- Self-Harm Potential: Risk Assessment For Self-Harm Potential Thoughts of Self-Harm: No current thoughts Method: No plan Availability of Means: No access/NA  Risk Assessment -Dangerous to Others Potential: Risk Assessment For  Dangerous to Others Potential Method: No Plan Availability of Means: No access or NA Intent: Vague intent or NA Notification Required: No need or identified person  DSM5 Diagnoses: Patient Active Problem List   Diagnosis Date Noted  . Small vessel disease, cerebrovascular 12/22/2015  . Slurred speech 07/24/2015  . Abnormal CT of brain 07/08/2015  . Speech abnormality 06/20/2015  . Thyroid nodule 06/20/2015  . S/P laparoscopic cholecystectomy 12/05/2014  . Heart murmur 02/11/2013  . Type II or unspecified type diabetes mellitus without mention of complication, not stated as uncontrolled 02/09/2013  . Other and unspecified hyperlipidemia 02/09/2013    Patient Centered Plan: Patient is on the following Treatment Plan(s):  Treatment plan to be formulated at next session Individual therapy 1x every 1-2 weeks, sessions to become less frequent as symptoms decrease, follow safety plan as needed.   Recommendations for Services/Supports/Treatments: Recommendations for Services/Supports/Treatments Recommendations For Services/Supports/Treatments: Individual Therapy, Medication Management  Treatment Plan Summary:    Referrals to Alternative Service(s): Referred to Alternative Service(s):   Place:   Date:   Time:    Referred to Alternative Service(s):   Place:   Date:   Time:    Referred to Alternative Service(s):   Place:   Date:   Time:    Referred to Alternative Service(s):   Place:   Date:   Time:     Geraldy Akridge A

## 2016-03-25 ENCOUNTER — Ambulatory Visit (HOSPITAL_COMMUNITY): Payer: Self-pay | Admitting: Psychiatry

## 2016-03-31 ENCOUNTER — Ambulatory Visit (HOSPITAL_COMMUNITY): Payer: Self-pay | Admitting: Clinical

## 2016-04-19 ENCOUNTER — Other Ambulatory Visit (HOSPITAL_COMMUNITY): Payer: Self-pay | Admitting: Psychiatry

## 2016-04-19 DIAGNOSIS — F331 Major depressive disorder, recurrent, moderate: Secondary | ICD-10-CM

## 2016-04-20 NOTE — Telephone Encounter (Signed)
Met with Dr. Lovena Le, helping to cover for Dr. Adele Schilder in his absence, who approved a new one time refill order of patient's prescribed Lexapro 33m, one a day, #30 with no refills.  E-scribed in new one time order as approved by Dr. TLovena Leto [facility] Patient to return for evaluation on 05/03/16.

## 2016-04-21 ENCOUNTER — Ambulatory Visit (INDEPENDENT_AMBULATORY_CARE_PROVIDER_SITE_OTHER): Payer: Federal, State, Local not specified - PPO | Admitting: Clinical

## 2016-04-21 DIAGNOSIS — F331 Major depressive disorder, recurrent, moderate: Secondary | ICD-10-CM

## 2016-04-26 ENCOUNTER — Encounter (HOSPITAL_COMMUNITY): Payer: Self-pay | Admitting: Clinical

## 2016-04-26 NOTE — Progress Notes (Signed)
   THERAPIST PROGRESS NOTE  Session Time: 2:35-3:35  Participation Level: Active  Behavioral Response: CasualAlertDepressed  Type of Therapy: Individual Therapy  Treatment Goals addressed: improve psychiatric symptoms, elevate mood (improve energy, increase activities, increase social interaction), Improve unhelpful thought patterns (decrease rumination, guilt, and regret), learn about diagnosis, healthy coping skills  Interventions: cbt, motivational interviewing, psychoeducation, grounding and mindfulness techniques,   Summary: Seriyah Collison is a 65  y.o. female who presents with Major Depressive Disorder, moderate  Suicidal/Homicidal: No, without intent/plan  Therapist Response: Romonia met with clinician for an individual therapy session she discussed her psychiatric symptoms, her current life events and her goals for therapy. Leverne shared that she experiences a lot of guilt and regret. Clinician asked open ended questions and Lenox shared about some of her ruminations and regrets. Clinician introduced a seven panel thought record sheet. Clinician asked open ended questions and Lakashia identified her trigger, she rated her emotions, she stated her negative automatic thoughts, she identified the evidence for and against the thoughts, she was then able to formulate healthier alternative thoughts. [name] and clinician discussed the process and her insights. Client and clinician discussed the thought emotion connection. Client and clinician agreed to discuss this further at future sessions.   Plan: Return again in 2-3 weeks.  Diagnosis: Axis I: Major Depressive Disorder, moderate    Treyana Sturgell A, LCSW 04/26/2016

## 2016-05-03 ENCOUNTER — Ambulatory Visit (HOSPITAL_COMMUNITY): Payer: Self-pay | Admitting: Psychiatry

## 2016-05-03 DIAGNOSIS — H2511 Age-related nuclear cataract, right eye: Secondary | ICD-10-CM | POA: Diagnosis not present

## 2016-05-03 DIAGNOSIS — H25811 Combined forms of age-related cataract, right eye: Secondary | ICD-10-CM | POA: Diagnosis not present

## 2016-05-07 ENCOUNTER — Other Ambulatory Visit: Payer: Self-pay | Admitting: Emergency Medicine

## 2016-05-10 ENCOUNTER — Ambulatory Visit (INDEPENDENT_AMBULATORY_CARE_PROVIDER_SITE_OTHER): Payer: Federal, State, Local not specified - PPO | Admitting: Clinical

## 2016-05-10 DIAGNOSIS — F331 Major depressive disorder, recurrent, moderate: Secondary | ICD-10-CM

## 2016-05-10 NOTE — Telephone Encounter (Signed)
Last visit with Dr. Cleta Albertsaub was actually 11/2015, but for an acute visit (abdominal pain, then follow-up diverticulitis). I will prescribe a refill, but recommend that patient discuss the use of this medication with her psychiatrist, Dr. Lolly MustacheArfeen, for additional fills.  Meds ordered this encounter  Medications  . temazepam (RESTORIL) 30 MG capsule    Sig: TAKE 1 CAPSULE BY MOUTH AT BEDTIME AS NEEDED FOR SLEEP    Dispense:  30 capsule    Refill:  0    This request is for a new prescription for a controlled substance as required by Federal/State law.

## 2016-05-10 NOTE — Progress Notes (Signed)
   THERAPIST PROGRESS NOTE  Session Time: 3:30 - 4:28  Participation Level: Active  Behavioral Response: CasualAlertDepressed  Type of Therapy: Individual Therapy  Treatment Goals addressed: improve psychiatric symptoms, elevate mood , Improve unhelpful thought patterns ( regret), learn about diagnosis, healthy coping skills  Interventions: cbt, motivational interviewing, psychoeducation, grounding   Summary: Meghan Hickman is Hickman 65  y.o. female who presents with Major Depressive Disorder, moderate  Suicidal/Homicidal: No, without intent/plan  Therapist Response: Meghan Hickman met with clinician for an individual therapy session she discussed her psychiatric symptoms, her current life events and her homework. Meghan Hickman shared that she continues to feel same to his depressed. He shared that she is been having trouble completing tasks and that she is not sleeping well. He stated that he is been having Hickman little bit of trouble with her speech and thinks it may be due to times she is been under anesthesia he clinician encouraged her to check this out with her doctor. She also shared that she had eye surgery and that it went very well. Meghan Hickman had completed her homework which was packet 1 and 2 depression. Client and clinician Reviewed and discussed her packet. Meghan Hickman shared Hickman bout some of her symptoms and her desire to be more engaged in life. Meghan Hickman shared Hickman bout some of her negative thoughts of regret. Client and clinician took one of her regrets and clinician asked open ended questions and Meghan Hickman explored the meaning the event had. Client and clinician began the process of challenging his meaning. Client and clinician agreed to continue the process at Hickman later date. Clinician asked what was one activity that Meghan Hickman enjoyed. She shared gardening. Meghan Hickman agreed to do some gardening in addition to packet 3 of depression.   Plan: Return again in 2-3 weeks.  Diagnosis: Axis I: Major Depressive Disorder,  moderate    Meghan Legendre A, LCSW 05/10/2016

## 2016-05-10 NOTE — Telephone Encounter (Signed)
05/25/15 last ov with Dr Cleta Albertsaub.  Refill 09/02/15 30 days with 3 refills. [facility]/2016 and 04/2016. Please advise on refill and ov needed.

## 2016-05-10 NOTE — Telephone Encounter (Signed)
rx called to pharmacy listed and hard hard copy destroyed

## 2016-05-11 NOTE — Progress Notes (Signed)
BH MD/PA/NP OP Progress Note  05/12/2016 3:45 PM Meghan Hickman  MRN:  [id number]  Chief Complaint:  Subjective:  "I'm just not happy" HPI:  Patient states that she has been feeling depressed since the last encounter. She talks about her mother who deceased in 12-23-11. She reports anxiety an needs to force herself to go to work. She continues to have insomnia, and feels somnolent after taking temazepam. She has been taking it twice a week. She denies SI. She denies previous suicide attempt. She denies AH/VH.    Visit Diagnosis:    ICD-9-CM ICD-10-CM   1. Moderate episode of recurrent major depressive disorder (HCC) 296.32 F33.1     Past Psychiatric History:  Per Dr. Sheela Stack note with update. Patient denies any history of psychiatric inpatient treatment, suicidal attempt, mania, psychosis or any hallucination.  She denies any history of PTSD, OCD or panic attacks.  She [facility] which was switched to Depakote due to high blood pressure from Effexor.  Past Medical History:  Past Medical History:  Diagnosis Date  . Arthritis   . Diabetes mellitus without complication (HCC)   . GERD (gastroesophageal reflux disease)   . Headache   . Heart murmur   . Hyperlipidemia   . Hypertension   . Speech problem   . Wears glasses     Past Surgical History:  Procedure Laterality Date  . CHOLECYSTECTOMY N/A 12/05/2014   Procedure: LAPAROSCOPIC CHOLECYSTECTOMY;  Surgeon: Emelia Loron, MD;  Location[facility];  Service: General;  Laterality: N/A;  . SHOULDER ARTHROSCOPY WITH SUBACROMIAL DECOMPRESSION AND BICEP TENDON REPAIR Right 04/23/2013   Procedure: SHOULDER ARTHROSCOPY WITH SUBACROMIAL DECOMPRESSION AND BICEP TENDON TENOTOMY;  Surgeon: Mable Paris, MD;  Location: [facility];  Service: Orthopedics;  Laterality: Right;    Family Psychiatric History: Patient denies any family history of psychiatric  illness.  Family History:  Family History  Problem Relation Age of Onset  . Stroke Mother   . Hypertension Mother   . Diabetes Mother   . Breast cancer Mother   . Heart disease Brother   . Healthy Father     Social History:  Social History   Social History  . Marital status: Single    Spouse name: N/A  . Number of children: 0  . Years of education: 51   Occupational History  . Postal Solicitor    Social History Main Topics  . Smoking status: Former Smoker    Packs/day: 0.20    Years: 10.00    Types: Cigarettes    Start date: 04/30/2012    Quit date: 08/16/2012  . Smokeless tobacco: Not on file  . Alcohol use No  . Drug use: No  . Sexual activity: No     Comment: was smoking 1/2 pppd   Other Topics Concern  . Not on file   Social History Narrative   Lives at home alone.   Right-handed.   2 cups caffeine per day.    Allergies: No Known Allergies  Metabolic Disorder Labs: Lab Results  Component Value Date   HGBA1C 6.5 (H) 10/30/2015   MPG 140 (H) 10/30/2015   No results found for: PROLACTIN Lab Results  Component Value Date   CHOL 147 10/30/2015   TRIG 98 10/30/2015   HDL 43 (L) 10/30/2015   CHOLHDL 3.4 10/30/2015   VLDL 20 10/30/2015   LDLCALC 84 10/30/2015   LDLCALC 111 04/24/2015  Current Medications: Current Outpatient Prescriptions  Medication Sig Dispense Refill  . almotriptan (AXERT) 12.5 MG tablet Take 12.5 mg by mouth daily as needed for migraine.   4  . aspirin 81 MG tablet Take 81 mg by mouth daily.    [name] Kitchen. atorvastatin (LIPITOR) 40 MG tablet Take 1 tablet (40 mg total) by mouth daily at 6 PM. 90 tablet 3  . divalproex (DEPAKOTE ER) 500 MG 24 hr tablet Take 1 tablet daily for depression. (Patient taking differently: Take 500 mg by mouth daily. ) 30 tablet 11  . escitalopram (LEXAPRO) 10 MG tablet Take 1 tablet (10 mg total) by mouth daily. 30 tablet 0  . estradiol (ESTRACE) 0.5 MG tablet Take 0.5 mg by mouth daily.  0  . gabapentin  (NEURONTIN) 100 MG capsule Take 100 mg by mouth 3 (three) times daily.    . Guaifenesin (MUCINEX MAXIMUM STRENGTH) 1200 MG TB12 Take 1 tablet (1,200 mg total) by mouth every 12 (twelve) hours as needed. (Patient not taking: Reported on 02/12/2016) 14 tablet 1  . ibuprofen (ADVIL,MOTRIN) 200 MG tablet Take 200 mg by mouth every 6 (six) hours as needed for mild pain or moderate pain.    [name] Kitchen. losartan-hydrochlorothiazide (HYZAAR) 50-12.5 MG per tablet Take 1 tablet by mouth daily. 90 tablet 3  . metFORMIN (GLUCOPHAGE) 500 MG tablet Take 1 tablet (500 mg total) by mouth daily. 90 tablet 3  . Multiple Vitamins-Minerals (MULTIVITAMIN & MINERAL PO) Take 1 tablet by mouth daily.    . ondansetron (ZOFRAN ODT) 4 MG disintegrating tablet Take 1 tablet (4 mg total) by mouth every 8 (eight) hours as needed for nausea or vomiting. 10 tablet 1  . ranitidine (ZANTAC) 150 MG tablet TAKE 1 TABLET (150 MG TOTAL) BY MOUTH 2 (TWO) TIMES DAILY. FOR 2 WEEKS THEN AS NEEDED 180 tablet 0  . temazepam (RESTORIL) 30 MG capsule TAKE 1 CAPSULE BY MOUTH AT BEDTIME AS NEEDED FOR SLEEP 30 capsule 0  . traMADol (ULTRAM) 50 MG tablet TAKE 1 TABLET BY MOUTH EVERY 6 HOURS AS NEEDED 30 tablet 5   No current facility-administered medications for this visit.     Neurologic: Headache: No Seizure: No Paresthesias: No  Musculoskeletal: Strength & Muscle Tone: within normal limits Gait & Station: normal Patient leans: N/A  Psychiatric Specialty Exam: Review of Systems  Psychiatric/Behavioral: Positive for depression and suicidal ideas. Negative for hallucinations and substance abuse. The patient is nervous/anxious and has insomnia.   All other systems reviewed and are negative.   There were no vitals taken for this visit.There is no height or weight on file to calculate BMI.  General Appearance: Casual  Eye Contact:  Good  Speech:  Clear and Coherent  Volume:  Normal  Mood:  Depressed  Affect:  Restricted, down  Thought Process:   Coherent and Goal Directed  Orientation:  Full (Time, Place, and Person)  Thought Content: Logical  Perceptions: denies AH/VH  Suicidal Thoughts:  No  Homicidal Thoughts:  No  Memory:  Immediate;   Fair  Judgement:  Fair  Insight:  Good  Psychomotor Activity:  Normal  Concentration:  Concentration: Fair and Attention Span: Fair  Recall:  [employer]  Fund of Knowledge: Fair  Language: Fair  Akathisia:  No  Handed:  Right  AIMS (if indicated):  N/A  Assets:  Communication Skills Desire for Improvement  ADL's:  Intact  Cognition: WNL  Sleep:  insomnia   Assessment Michelene GardenerLinda M Vanmaanen is a 65 year old female with  depression, hypertension, hyperlipidemia, diabetes who presented for follow up. She is one of Dr. Sheela Stack patients.   # MDD Patient endorses neurovegetative symptoms after death of her mother in 2011-12-20. Will increase lexapro to target her mood symptoms. Will discontinue temazepam given limited effect. Will have prn trazodone for insomnia. Patient to continue see a therapist.  Plan 1. Increase Lexapro 20 mg daily 2. Discontinue temazepam 3. Start Trazodone 25- 50 mg at night for insomnia 4. Return to clinic in 3 months  Treatment Plan Summary:Medication management  The patient demonstrates the following  risk factors for suicide: Chronic risk factors for suicide include psychiatric disorder. Acute risk factors for suicide include loss of her mother in 12/20/11. Protective factors for this patient include positive therapeutic relationship, coping skills, hope for the future.  Considering these factors, the overall suicide risk at this point appears to be low.  Neysa Hotter, MD 05/12/2016, 3:45 PM

## 2016-05-12 ENCOUNTER — Ambulatory Visit (INDEPENDENT_AMBULATORY_CARE_PROVIDER_SITE_OTHER): Payer: Federal, State, Local not specified - PPO | Admitting: Psychiatry

## 2016-05-12 VITALS — BP 130/79 | HR 90 | Resp 12 | Ht 60.0 in | Wt 144.6 lb

## 2016-05-12 DIAGNOSIS — F331 Major depressive disorder, recurrent, moderate: Secondary | ICD-10-CM

## 2016-05-12 MED ORDER — ESCITALOPRAM OXALATE 10 MG PO TABS: 20.0000 mg | ORAL_TABLET | Freq: Every day | ORAL | 2 refills | Status: DC

## 2016-05-12 MED ORDER — TRAZODONE HCL 50 MG PO TABS: 50.0000 mg | ORAL_TABLET | Freq: Every day | ORAL | 2 refills | Status: DC

## 2016-05-12 NOTE — Patient Instructions (Addendum)
1. Increase Lexapro 20 mg daily 2. Discontinue temazepam 3. Start Trazodone 25- 50 mg at night for insomnia 4. Return to clinic in 3 months

## 2016-05-17 ENCOUNTER — Encounter (HOSPITAL_COMMUNITY): Payer: Self-pay | Admitting: Clinical

## 2016-05-17 ENCOUNTER — Ambulatory Visit (INDEPENDENT_AMBULATORY_CARE_PROVIDER_SITE_OTHER): Payer: Federal, State, Local not specified - PPO | Admitting: Clinical

## 2016-05-17 DIAGNOSIS — F331 Major depressive disorder, recurrent, moderate: Secondary | ICD-10-CM | POA: Diagnosis not present

## 2016-05-17 NOTE — Progress Notes (Signed)
   THERAPIST PROGRESS NOTE  Session Time: 8:05 - 9:00   Participation Level: Active  Behavioral Response: CasualAlertDepressed  Type of Therapy: Individual Therapy  Treatment Goals addressed: improve psychiatric symptoms, elevate mood, Improve unhelpful thought patterns , learn about diagnosis, healthy coping skills  Interventions: cbt, motivational interviewing, psychoeducation, grounding and mindfulness techniques,   Summary: Meghan Hickman is a 65  y.o. female who presents with Major Depressive Disorder, moderate  Suicidal/Homicidal: No, without intent/plan  Therapist Response: Meghan Hickman met with clinician for an individual therapy session she discussed her psychiatric symptoms, her current life events and her homework. Meghan Hickman shared that she continues to be depressed but was happy to be here. She shared that she completed her packet 3 on depression. Client and clinician discussed the thought emotion connection. Meghan Hickman shared some examples from her life. She shared some a bout an experience at work. Client and clinician discussed her example and how changing her thoughts which change her experience. Client and clinician discussed the process of challenging and changing thoughts. Meghan Hickman asked questions a bout the process and thoughts. Clinician answered the questions. Client and clinician discussed grounding and mindfulness techniques client and clinician practiced one of the techniques to gather. Meghan Hickman agreed to practice her grounding techniques and mindfulne as well as finish packet 4 (depression) before and bring it back with her next sessionss techniques     Plan: Return again in 2-3 weeks.  Diagnosis: Axis I: Major Depressive Disorder, moderate     Cherae Marton A, LCSW 05/17/2016

## 2016-05-24 ENCOUNTER — Encounter (HOSPITAL_COMMUNITY): Payer: Self-pay | Admitting: Clinical

## 2016-05-31 DIAGNOSIS — G43719 Chronic migraine without aura, intractable, without status migrainosus: Secondary | ICD-10-CM | POA: Diagnosis not present

## 2016-06-01 ENCOUNTER — Ambulatory Visit (INDEPENDENT_AMBULATORY_CARE_PROVIDER_SITE_OTHER): Payer: Federal, State, Local not specified - PPO | Admitting: Clinical

## 2016-06-01 DIAGNOSIS — F331 Major depressive disorder, recurrent, moderate: Secondary | ICD-10-CM

## 2016-06-01 NOTE — Progress Notes (Signed)
   THERAPIST PROGRESS NOTE  Session Time: 4:30 - 5:30  Participation Level: Active  Behavioral Response: CasualAlertDepressed  Type of Therapy: Individual Therapy  Treatment Goals addressed: improve psychiatric symptoms, elevate mood (improve energy, increase activities, increase social interaction), Improve unhelpful thought patterns (decrease rumination, guilt, and regret), learn about diagnosis, healthy coping skills  Interventions: cbt, motivational interviewing, psychoeducation, grounding and mindfulness techniques,   Summary: Meghan Hickman is a 65  y.o. female who presents with Major Depressive Disorder, moderate  Suicidal/Homicidal: No, without intent/plan  Therapist Response: Sharniece met with clinician for an individual therapy session she discussed her psychiatric symptoms, her current life events and her homework. Bryar share that he symptoms "have been the same." She shared that she continues to feel depressed and uninterested. Clinician asked open ended questions and Ladawna shared that a lot of her thoughts are about her mother and that she should've done more for her and that her mother went too soon. Yomara shared what she felt she should've done for her mother. Client and clinician discussed hindsight versus reality. Clinician asked what would've been different if she had done those things. Client and clinician discussed whether or not her hindsight predictions would be true. client and clinician discussed whether or not Olla did what she felt was best at the time. She did. Client and clinician discussed the purpose of replaying past that we we can change. Clinician asked open ended questions and Rileyann had the insight that if she keeps working on how to change the outcome she will  Not have to accept the very difficult truth - that her mother is dead and there is nothing she can do about it. Client and clinician discussed how working on accepting this truth could help improve her  mood. Client and clinician discussed the process of acceptance.  Elianne shared that she has been practicing her grounding techniques, but has not completed packet  4 (depression) but will  bring it back with her next session.    Plan: Return again in 2-3 weeks.  Diagnosis: Axis I: Major Depressive Disorder, moderate    Baer Hinton A, LCSW 06/01/2016

## 2016-06-03 DIAGNOSIS — H26492 Other secondary cataract, left eye: Secondary | ICD-10-CM | POA: Diagnosis not present

## 2016-06-07 ENCOUNTER — Encounter (HOSPITAL_COMMUNITY): Payer: Self-pay | Admitting: Clinical

## 2016-06-23 ENCOUNTER — Ambulatory Visit (INDEPENDENT_AMBULATORY_CARE_PROVIDER_SITE_OTHER): Payer: Federal, State, Local not specified - PPO | Admitting: Family Medicine

## 2016-06-23 ENCOUNTER — Ambulatory Visit (INDEPENDENT_AMBULATORY_CARE_PROVIDER_SITE_OTHER): Payer: Federal, State, Local not specified - PPO

## 2016-06-23 VITALS — BP 110/68 | HR 98 | Temp 98.4°F

## 2016-06-23 DIAGNOSIS — R062 Wheezing: Secondary | ICD-10-CM | POA: Diagnosis not present

## 2016-06-23 DIAGNOSIS — J209 Acute bronchitis, unspecified: Secondary | ICD-10-CM | POA: Diagnosis not present

## 2016-06-23 DIAGNOSIS — J019 Acute sinusitis, unspecified: Secondary | ICD-10-CM

## 2016-06-23 DIAGNOSIS — R05 Cough: Secondary | ICD-10-CM | POA: Diagnosis not present

## 2016-06-23 MED ORDER — AMOXICILLIN-POT CLAVULANATE 875-125 MG PO TABS: 1.0000 | ORAL_TABLET | Freq: Two times a day (BID) | ORAL | 0 refills | Status: DC

## 2016-06-23 MED ORDER — IPRATROPIUM BROMIDE 0.03 % NA SOLN: 2.0000 | Freq: Two times a day (BID) | NASAL | 0 refills | Status: DC

## 2016-06-23 MED ORDER — BENZONATATE 100 MG PO CAPS: 100.0000 mg | ORAL_CAPSULE | Freq: Three times a day (TID) | ORAL | 0 refills | Status: DC | PRN

## 2016-06-23 NOTE — Progress Notes (Signed)
Patient ID: Meghan GardenerLinda M Houser, female    DOB: 06-Feb-1951, 65 y.o.   MRN: [id number]  PCP: No PCP Per Patient  Chief Complaint  Patient presents with  . Cough    congestion; Sneezing  . Fatigue  . Fever    101 this morning    Subjective:   HPI 65 year old female presents for evaluation of cough with nasal congestion times 6 days. [facility] Reports today awakening with 101 fever. Symptoms of throat soreness, cough (occasionally productive yellow sputum), intermittent wheezing times 5 days, persistent headache and fatigue. She has taken Coricidin, Mucinex, Robitussin, and Advil without relief of symptoms. Reports some chest tightness. Denies sore throat or ear pain.  Social History   Social History  . Marital status: Single    Spouse name: N/A  . Number of children: 0  . Years of education: 8316   Occupational History  . Postal SolicitorClerk    Social History Main Topics  . Smoking status: Former Smoker    Packs/day: 0.20    Years: 10.00    Types: Cigarettes    Start date: 04/30/2012    Quit date: 08/16/2012  . Smokeless tobacco: Not on file  . Alcohol use No  . Drug use: No  . Sexual activity: No     Comment: was smoking 1/2 pppd   Other Topics Concern  . Not on file   Social History Narrative   Lives at home alone.   Right-handed.   2 cups caffeine per day.   Family History  Problem Relation Age of Onset  . Stroke Mother   . Hypertension Mother   . Diabetes Mother   . Breast cancer Mother   . Healthy Father   . Heart disease Brother    Review of Systems HPI  Patient Active Problem List   Diagnosis Date Noted  . Moderate episode of recurrent major depressive disorder (HCC) 05/12/2016  . Small vessel disease, cerebrovascular 12/22/2015  . Slurred speech 07/24/2015  . Abnormal CT of brain 07/08/2015  . Speech abnormality 06/20/2015  . Thyroid nodule 06/20/2015  . S/P laparoscopic cholecystectomy 12/05/2014  . Heart murmur 02/11/2013  . Type II or  unspecified type diabetes mellitus without mention of complication, not stated as uncontrolled 02/09/2013  . Other and unspecified hyperlipidemia 02/09/2013     Prior to Admission medications   Medication Sig Start Date End Date Taking? Authorizing Provider  almotriptan (AXERT) 12.5 MG tablet Take 12.5 mg by mouth daily as needed for migraine.  09/23/14  Yes Historical Provider, MD  aspirin 81 MG tablet Take 81 mg by mouth daily.   Yes Historical Provider, MD  atorvastatin (LIPITOR) 40 MG tablet Take 1 tablet (40 mg total) by mouth daily at 6 PM. 04/24/15  Yes Collene GobbleSteven A Daub, MD  escitalopram (LEXAPRO) 10 MG tablet Take 2 tablets (20 mg total) by mouth daily. 05/12/16  Yes Neysa Hottereina Hisada, MD  estradiol (ESTRACE) 0.5 MG tablet Take 0.5 mg by mouth daily. 11/12/15  Yes Historical Provider, MD  gabapentin (NEURONTIN) 100 MG capsule Take 100 mg by mouth 3 (three) times daily.   Yes Historical Provider, MD  losartan-hydrochlorothiazide (HYZAAR) 50-12.5 MG per tablet Take 1 tablet by mouth daily. 04/24/15 06/23/16 Yes Collene GobbleSteven A Daub, MD  metFORMIN (GLUCOPHAGE) 500 MG tablet Take 1 tablet (500 mg total) by mouth daily. 04/24/15  Yes Collene GobbleSteven A Daub, MD  Multiple Vitamins-Minerals (MULTIVITAMIN & MINERAL PO) Take 1 tablet by mouth daily.  Yes Historical Provider, MD  ranitidine (ZANTAC) 150 MG tablet TAKE 1 TABLET (150 MG TOTAL) BY MOUTH 2 (TWO) TIMES DAILY. FOR 2 WEEKS THEN AS NEEDED 03/04/16  Yes Collene GobbleSteven A Daub, MD  traMADol (ULTRAM) 50 MG tablet TAKE 1 TABLET BY MOUTH EVERY 6 HOURS AS NEEDED 03/04/16  Yes Collene GobbleSteven A Daub, MD  traZODone (DESYREL) 50 MG tablet Take 1 tablet (50 mg total) by mouth at bedtime. Try 25- 50 mg at night for insomnia 05/12/16  Yes Neysa Hottereina Hisada, MD  ondansetron (ZOFRAN ODT) 4 MG disintegrating tablet Take 1 tablet (4 mg total) by mouth every 8 (eight) hours as needed for nausea or vomiting. Patient not taking: Reported on 06/23/2016 11/30/15   Vanetta MuldersScott Zackowski, MD  temazepam (RESTORIL) 30 MG  capsule TAKE 1 CAPSULE BY MOUTH AT BEDTIME AS NEEDED FOR SLEEP Patient not taking: Reported on 06/23/2016 05/10/16   Porfirio Oarhelle Jeffery, PA-C   No Known Allergies    Objective:  Physical Exam  Constitutional: She is oriented to person, place, and time. She appears well-developed and well-nourished.  HENT:  Head: Normocephalic and atraumatic.  Right Ear: External ear normal.  Left Ear: External ear normal.  Eyes: Conjunctivae and EOM are normal. Pupils are equal, round, and reactive to light.  Neck: Normal range of motion.  Cardiovascular: Normal rate, regular rhythm, normal heart sounds and intact distal pulses.   Pulmonary/Chest: Effort normal. She has wheezes.  Bilateral wheezes auscultated mid posterior back.  Musculoskeletal: Normal range of motion.  Neurological: She is alert and oriented to person, place, and time.  Skin: Skin is warm and dry.  Psychiatric: She has a normal mood and affect. Her behavior is normal. Judgment and thought content normal.  Flat Affect     Vitals:   06/23/16 1512  BP: 110/68  Temp: 98.4 F (36.9 C)   Assessment & Plan:  1. Acute bronchitis, unspecified organism - DG Chest 2 View;  2. Acute sinusitis, recurrence not specified, unspecified location  Plan:  . amoxicillin-clavulanate (AUGMENTIN) 875-125 MG tablet    Sig: Take 1 tablet by mouth 2 (two) times daily.    Dispense:  20 tablet  . benzonatate (TESSALON) 100 MG capsule    Sig: Take 1-2 capsules (100-200 mg total) by mouth 3 (three) times daily as needed for cough.  [name] Kitchen. ipratropium (ATROVENT) 0.03 % nasal spray    Sig: Place 2 sprays into both nostrils 2 (two) times daily.   Return for follow-up as needed.  Godfrey PickKimberly S. Tiburcio PeaHarris, MSN, FNP-C [facility]

## 2016-06-23 NOTE — Patient Instructions (Addendum)
Start Augmentin 1 tablet twice daily for 10 days.  Start Atrovent 2 sprays, twice daily for nasal congestion.  Take Benzonatate 100-200 mg up to 3 times daily for cough.   IF you received an x-ray today, you will receive an invoice from [facility]. Please contact [facility] at [phone] with questions or concerns regarding your invoice.   IF you received labwork today, you will receive an invoice from [facility]. Please contact [facility] at [phone] with questions or concerns regarding your invoice.   Our billing staff will not be able to assist you with questions regarding bills from these companies.  You will be contacted with the lab results as soon as they are available. The fastest way to get your results is to activate your My Chart account. Instructions are located on the last page of this paperwork. If you have not heard from us regarding the results in 2 weeks, please contact this office.

## 2016-07-26 ENCOUNTER — Other Ambulatory Visit: Payer: Self-pay | Admitting: Emergency Medicine

## 2016-07-26 DIAGNOSIS — R1013 Epigastric pain: Secondary | ICD-10-CM

## 2016-08-18 ENCOUNTER — Ambulatory Visit (HOSPITAL_COMMUNITY): Payer: Self-pay | Admitting: Psychiatry

## 2016-08-19 ENCOUNTER — Ambulatory Visit (INDEPENDENT_AMBULATORY_CARE_PROVIDER_SITE_OTHER): Payer: Federal, State, Local not specified - PPO | Admitting: Physician Assistant

## 2016-08-19 ENCOUNTER — Ambulatory Visit (INDEPENDENT_AMBULATORY_CARE_PROVIDER_SITE_OTHER): Payer: Federal, State, Local not specified - PPO

## 2016-08-19 VITALS — BP 128/78 | HR 85 | Temp 97.9°F | Ht 60.0 in | Wt 150.2 lb

## 2016-08-19 DIAGNOSIS — R05 Cough: Secondary | ICD-10-CM

## 2016-08-19 DIAGNOSIS — R062 Wheezing: Secondary | ICD-10-CM

## 2016-08-19 DIAGNOSIS — R0989 Other specified symptoms and signs involving the circulatory and respiratory systems: Secondary | ICD-10-CM

## 2016-08-19 DIAGNOSIS — J9801 Acute bronchospasm: Secondary | ICD-10-CM | POA: Diagnosis not present

## 2016-08-19 DIAGNOSIS — R059 Cough, unspecified: Secondary | ICD-10-CM

## 2016-08-19 LAB — POCT CBC
GRANULOCYTE PERCENT: 58.9 % (ref 37–80)
HCT, POC: 38.8 % (ref 37.7–47.9)
Hemoglobin: 13.1 g/dL (ref 12.2–16.2)
Lymph, poc: 2.7 (ref 0.6–3.4)
MCH: 27.7 pg (ref 27–31.2)
MCHC: 33.7 g/dL (ref 31.8–35.4)
MCV: 82.3 fL (ref 80–97)
MID (CBC): 0.3 (ref 0–0.9)
MPV: 8.2 fL (ref 0–99.8)
PLATELET COUNT, POC: 260 10*3/uL (ref 142–424)
POC Granulocyte: 4.3 (ref 2–6.9)
POC LYMPH PERCENT: 36.4 %L (ref 10–50)
POC MID %: 4.7 %M (ref 0–12)
RBC: 4.71 M/uL (ref 4.04–5.48)
RDW, POC: 14.4 %
WBC: 7.3 10*3/uL (ref 4.6–10.2)

## 2016-08-19 LAB — POCT GLYCOSYLATED HEMOGLOBIN (HGB A1C): Hemoglobin A1C: 5.9

## 2016-08-19 MED ORDER — ALBUTEROL SULFATE HFA 108 (90 BASE) MCG/ACT IN AERS: 2.0000 | INHALATION_SPRAY | Freq: Four times a day (QID) | RESPIRATORY_TRACT | 0 refills | Status: DC | PRN

## 2016-08-19 MED ORDER — IPRATROPIUM BROMIDE 0.02 % IN SOLN
0.5000 mg | Freq: Once | RESPIRATORY_TRACT | Status: AC
  Administered 2016-08-19: 0.5 mg via RESPIRATORY_TRACT

## 2016-08-19 MED ORDER — PREDNISONE 20 MG PO TABS: 40.0000 mg | ORAL_TABLET | Freq: Every day | ORAL | 0 refills | Status: DC

## 2016-08-19 MED ORDER — ALBUTEROL SULFATE (2.5 MG/3ML) 0.083% IN NEBU
2.5000 mg | INHALATION_SOLUTION | Freq: Once | RESPIRATORY_TRACT | Status: AC
  Administered 2016-08-19: 2.5 mg via RESPIRATORY_TRACT

## 2016-08-19 NOTE — Progress Notes (Signed)
08/22/2016 8:25 AM   DOB: [birth year] / MRN: [id number]  SUBJECTIVE:  Meghan Hickman is a 66 y.o. female presenting for cough that has been present 1 week.   She associates chest congestion, rhinorrhea, and fatigue. She has tried Coricidin and "a bunch of others" all with minimal relief. She feels that she is worsening. She denies a history asthma, COPD, and does have a history of diabetes.   She has No Known Allergies.   She  has a past medical history of Arthritis; Diabetes mellitus without complication (HCC); GERD (gastroesophageal reflux disease); Headache; Heart murmur; Hyperlipidemia; Hypertension; Speech problem; and Wears glasses.    She  reports that she quit smoking about 4 years ago. Her smoking use included Cigarettes. She started smoking about 4 years ago. She has a 10.00 pack-year smoking history. She has never used smokeless tobacco. She reports that she does not drink alcohol or use drugs. She  reports that she does not engage in sexual activity. The patient  has a past surgical history that includes Shoulder arthroscopy with subacromial decompression and bicep tendon repair (Right, 04/23/2013) and Cholecystectomy (N/A, 12/05/2014).  Her family history includes Breast cancer in her mother; Diabetes in her mother; Healthy in her father; Heart disease in her brother; Hypertension in her mother; Stroke in her mother.  Review of Systems  Constitutional: Negative for chills and fever.  Respiratory: Negative for shortness of breath.   Cardiovascular: Negative for chest pain.  Neurological: Negative for dizziness.    The problem list and medications were reviewed and updated by myself where necessary and exist elsewhere in the encounter.   OBJECTIVE:  BP 128/78 (BP Location: Right Arm, Patient Position: Sitting, Cuff Size: Small)   Pulse 85   Temp 97.9 F (36.6 C) (Oral)   Ht 5' (1.524 m)   Wt 150 lb 3.2 oz (68.1 kg)   SpO2 96%   PF 250 L/min   BMI 29.33 kg/m    Pulse  Readings from Last 3 Encounters:  08/19/16 85  06/23/16 98  12/22/15 84   Lab Results  Component Value Date   HGBA1C 5.9 08/19/2016     Physical Exam  Constitutional: She is oriented to person, place, and time. No distress.  Cardiovascular: Normal rate, regular rhythm and normal heart sounds.   Pulmonary/Chest: No respiratory distress. She has wheezes. She has rales. She exhibits no tenderness.  Musculoskeletal: Normal range of motion.  Neurological: She is alert and oriented to person, place, and time.  Skin: She is not diaphoretic.    Results for orders placed or performed in visit on 08/19/16 (from the past 72 hour(s))  POCT CBC     Status: None   Collection Time: 08/19/16  7:00 PM  Result Value Ref Range   WBC 7.3 4.6 - 10.2 K/uL   Lymph, poc 2.7 0.6 - 3.4   POC LYMPH PERCENT 36.4 10 - 50 %L   MID (cbc) 0.3 0 - 0.9   POC MID % 4.7 0 - 12 %M   POC Granulocyte 4.3 2 - 6.9   Granulocyte percent 58.9 37 - 80 %G   RBC 4.71 4.04 - 5.48 M/uL   Hemoglobin 13.1 12.2 - 16.2 g/dL   HCT, POC 40.9 81.1 - 47.9 %   MCV 82.3 80 - 97 fL   MCH, POC 27.7 27 - 31.2 pg   MCHC 33.7 31.8 - 35.4 g/dL   RDW, POC 91.4 %   Platelet Count, POC 260 142 - 424  K/uL   MPV 8.2 0 - 99.8 fL  POCT glycosylated hemoglobin (Hb A1C)     Status: None   Collection Time: 08/19/16  7:03 PM  Result Value Ref Range   Hemoglobin A1C 5.9     No results found.  ASSESSMENT AND PLAN:  Meghan Hickman was seen today for cough.  Diagnoses and all orders for this visit:  Cough: Rads negative for pneumonia and CBC reassuring. She was wheezing in the office and nebs improved this.  Given her history of smoking I will start her on prednisone. Negative for diabetes.    -     DG Chest 2 View; Future  Chest rales -     POCT CBC  Wheezes -     albuterol (PROVENTIL) (2.5 MG/3ML) 0.083% nebulizer solution 2.5 mg; Take 3 mLs (2.5 mg total) by nebulization once. -     ipratropium (ATROVENT) nebulizer solution 0.5 mg; Take  2.5 mLs (0.5 mg total) by nebulization once. -     POCT glycosylated hemoglobin (Hb A1C)  Bronchospasm -     predniSONE (DELTASONE) 20 MG tablet; Take 2 tablets (40 mg total) by mouth daily with breakfast. -     albuterol (PROVENTIL HFA;VENTOLIN HFA) 108 (90 Base) MCG/ACT inhaler; Inhale 2 puffs into the lungs every 6 (six) hours as needed for wheezing or shortness of breath.  Other orders -     Cancel: HM Diabetes Foot Exam    The patient is advised to call or return to clinic if she does not see an improvement in symptoms, or to seek the care of the closest emergency department if she worsens with the above plan.   Deliah BostonMichael Aniket Paye, MHS, PA-C [facility] 08/22/2016 8:25 AM

## 2016-08-19 NOTE — Progress Notes (Signed)
Post neb peak flow 1st-250, 2nd-250, 3rd-252-TKG cma

## 2016-08-19 NOTE — Patient Instructions (Signed)
     IF you received an x-ray today, you will receive an invoice from [facility]. Please contact [facility] at [phone] with questions or concerns regarding your invoice.   IF you received labwork today, you will receive an invoice from [facility]. Please contact [facility] at [phone] with questions or concerns regarding your invoice.   Our billing staff will not be able to assist you with questions regarding bills from these companies.  You will be contacted with the lab results as soon as they are available. The fastest way to get your results is to activate your My Chart account. Instructions are located on the last page of this paperwork. If you have not heard from us regarding the results in 2 weeks, please contact this office.     

## 2016-09-01 DIAGNOSIS — H26491 Other secondary cataract, right eye: Secondary | ICD-10-CM | POA: Diagnosis not present

## 2016-09-11 ENCOUNTER — Other Ambulatory Visit: Payer: Self-pay | Admitting: Emergency Medicine

## 2016-09-11 DIAGNOSIS — E785 Hyperlipidemia, unspecified: Secondary | ICD-10-CM

## 2016-09-11 DIAGNOSIS — E119 Type 2 diabetes mellitus without complications: Secondary | ICD-10-CM

## 2016-09-11 DIAGNOSIS — I10 Essential (primary) hypertension: Secondary | ICD-10-CM

## 2016-09-15 NOTE — Telephone Encounter (Signed)
Meds ordered this encounter  Medications  . atorvastatin (LIPITOR) 40 MG tablet    Sig: TAKE 1 TABLET BY MOUTH EVERY DAY AT 6 PM    Dispense:  90 tablet    Refill:  0  . metFORMIN (GLUCOPHAGE) 500 MG tablet    Sig: TAKE 1 TABLET BY MOUTH EVERY DAY    Dispense:  90 tablet    Refill:  0  . losartan-hydrochlorothiazide (HYZAAR) 50-12.5 MG tablet    Sig: TAKE 1 TABLET BY MOUTH DAILY.    Dispense:  90 tablet    Refill:  0   Transferred call to Ms. C. Califf to schedule follow-up, fasting labs and establish care with new provider, as Dr. Cleta Albertsaub has retired.

## 2016-09-23 DIAGNOSIS — H26492 Other secondary cataract, left eye: Secondary | ICD-10-CM | POA: Diagnosis not present

## 2016-10-13 ENCOUNTER — Encounter: Payer: Self-pay | Admitting: Family Medicine

## 2016-10-13 ENCOUNTER — Ambulatory Visit (INDEPENDENT_AMBULATORY_CARE_PROVIDER_SITE_OTHER): Payer: Federal, State, Local not specified - PPO | Admitting: Family Medicine

## 2016-10-13 VITALS — BP 129/77 | HR 105 | Temp 98.5°F | Resp 18 | Ht 60.0 in | Wt 150.0 lb

## 2016-10-13 DIAGNOSIS — F5101 Primary insomnia: Secondary | ICD-10-CM

## 2016-10-13 DIAGNOSIS — Z23 Encounter for immunization: Secondary | ICD-10-CM | POA: Diagnosis not present

## 2016-10-13 DIAGNOSIS — I10 Essential (primary) hypertension: Secondary | ICD-10-CM | POA: Diagnosis not present

## 2016-10-13 DIAGNOSIS — E119 Type 2 diabetes mellitus without complications: Secondary | ICD-10-CM | POA: Diagnosis not present

## 2016-10-13 DIAGNOSIS — G4700 Insomnia, unspecified: Secondary | ICD-10-CM | POA: Insufficient documentation

## 2016-10-13 DIAGNOSIS — G43909 Migraine, unspecified, not intractable, without status migrainosus: Secondary | ICD-10-CM | POA: Insufficient documentation

## 2016-10-13 DIAGNOSIS — G43709 Chronic migraine without aura, not intractable, without status migrainosus: Secondary | ICD-10-CM

## 2016-10-13 DIAGNOSIS — E782 Mixed hyperlipidemia: Secondary | ICD-10-CM

## 2016-10-13 DIAGNOSIS — F331 Major depressive disorder, recurrent, moderate: Secondary | ICD-10-CM | POA: Diagnosis not present

## 2016-10-13 NOTE — Assessment & Plan Note (Signed)
Pt on metformin for monotherapy. Well controlled. Last a1c at goal. Prevnar given today. Continue ADA diet

## 2016-10-13 NOTE — Assessment & Plan Note (Signed)
Insomnia treated with Temazepam. Continue follow up with Specialist. Pt should also continue to practice good sleep hygiene.

## 2016-10-13 NOTE — Progress Notes (Signed)
Chief Complaint  Patient presents with  . Establish Care    HPI   Diabetes Mellitus Type II, Follow-up: Patient here for follow-up of Type 2 diabetes mellitus.  Current symptoms/problems include none  She is compliant with metformin and denies side effects.  Known diabetic complications: none Cardiovascular risk factors: advanced age (older than 63 for men, 6 for women), diabetes mellitus, dyslipidemia, hypertension and obesity (BMI >= 30 kg/m2) Current diabetic medications include oral agent (monotherapy): metformin (generic).   Eye exam current (within one year): yes Weight trend: stable Prior visit with dietician: no Current diet: in general, a "healthy" diet   Current exercise: walking  Current monitoring regimen: home blood tests - weekly Home blood sugar records: postprandial range: 120 Any episodes of hypoglycemia? no  Is She on ACE inhibitor or angiotensin II receptor blocker?  Yes  losartan + HCTZ (Hyzaar)  Wt Readings from Last 3 Encounters:  10/13/16 150 lb (68 kg)  08/19/16 150 lb 3.2 oz (68.1 kg)  12/22/15 142 lb 12 oz (64.8 kg)   Hypertension: Patient here for follow-up of elevated blood pressure. She is exercising and is adherent to low salt diet.  Blood pressure is well controlled at home. Cardiac symptoms none. Patient denies chest pain, chest pressure/discomfort, fatigue and palpitations.  Cardiovascular risk factors: advanced age (older than 1 for men, 33 for women), diabetes mellitus, hypertension and obesity (BMI >= 30 kg/m2). Use of agents associated with hypertension: estrogens. History of target organ damage: none. BP Readings from Last 3 Encounters:  10/13/16 129/77  08/19/16 128/78  06/23/16 110/68   Hyperlipidemia: Patient presents with hyperlipidemia.  She was tested because of her diabetes. negative. There is a family history of hyperlipidemia. There is a family history of early ischemia heart disease. She eats most of the meals at fast  food Dinner was a [place] sandwich from [employer]  This morning she had pancakes from [employer]  Lab Results  Component Value Date   CHOL 147 10/30/2015   HDL 43 (L) 10/30/2015   LDLCALC 84 10/30/2015   TRIG 98 10/30/2015   CHOLHDL 3.4 10/30/2015    Migraines Pt reports that she has been doing well on her axert when she gets migraines She has less headaches due to her gabapentin for prophylaxis She takes Tramadol for pain occasionally.  Insomnia She reports that she takes Restoril for sleep She reports that the gabapentin does not help her sleep She has improvement in sleep since being on Restoril She is no longer on trazodone  Depression Pt reports that she is on Lexapro 10mg  for depression It seems to help her mood and has been working well for her She denies SI or depressed mood currently  Past Medical History:  Diagnosis Date  . Arthritis   . Diabetes mellitus without complication (HCC)   . GERD (gastroesophageal reflux disease)   . Headache   . Heart murmur   . Hyperlipidemia   . Hypertension   . Speech problem   . Wears glasses     Current Outpatient Prescriptions  Medication Sig Dispense Refill  . albuterol (PROVENTIL HFA;VENTOLIN HFA) 108 (90 Base) MCG/ACT inhaler Inhale 2 puffs into the lungs every 6 (six) hours as needed for wheezing or shortness of breath. 1 Inhaler 0  . almotriptan (AXERT) 12.5 MG tablet Take 12.5 mg by mouth daily as needed for migraine.   4  . aspirin 81 MG tablet Take 81 mg by mouth daily.    [name] Kitchen atorvastatin (LIPITOR) 40 MG  tablet TAKE 1 TABLET BY MOUTH EVERY DAY AT 6 PM 90 tablet 0  . benzonatate (TESSALON) 100 MG capsule Take 1-2 capsules (100-200 mg total) by mouth 3 (three) times daily as needed for cough. 40 capsule 0  . escitalopram (LEXAPRO) 10 MG tablet Take 2 tablets (20 mg total) by mouth daily. 60 tablet 2  . estradiol (ESTRACE) 0.5 MG tablet Take 0.5 mg by mouth daily.  0  . gabapentin (NEURONTIN) 100 MG capsule Take 100 mg by  mouth 3 (three) times daily.    [name] Kitchen ipratropium (ATROVENT) 0.03 % nasal spray Place 2 sprays into both nostrils 2 (two) times daily. 30 mL 0  . losartan-hydrochlorothiazide (HYZAAR) 50-12.5 MG tablet TAKE 1 TABLET BY MOUTH DAILY. 90 tablet 0  . metFORMIN (GLUCOPHAGE) 500 MG tablet TAKE 1 TABLET BY MOUTH EVERY DAY 90 tablet 0  . Multiple Vitamins-Minerals (MULTIVITAMIN & MINERAL PO) Take 1 tablet by mouth daily.    . ondansetron (ZOFRAN ODT) 4 MG disintegrating tablet Take 1 tablet (4 mg total) by mouth every 8 (eight) hours as needed for nausea or vomiting. 10 tablet 1  . predniSONE (DELTASONE) 20 MG tablet Take 2 tablets (40 mg total) by mouth daily with breakfast. 10 tablet 0  . ranitidine (ZANTAC) 150 MG tablet TAKE 1 TABLET BY MOUTH TWICE DAILY FOR 2 WEEKS, THEN AS NEEDED 180 tablet 0  . temazepam (RESTORIL) 30 MG capsule TAKE 1 CAPSULE BY MOUTH AT BEDTIME AS NEEDED FOR SLEEP 30 capsule 0  . traMADol (ULTRAM) 50 MG tablet TAKE 1 TABLET BY MOUTH EVERY 6 HOURS AS NEEDED 30 tablet 5   No current facility-administered medications for this visit.     Allergies: No Known Allergies  Past Surgical History:  Procedure Laterality Date  . CHOLECYSTECTOMY N/A 12/05/2014   Procedure: LAPAROSCOPIC CHOLECYSTECTOMY;  Surgeon: Emelia Loron, MD;  Location: [facility] OR;  Service: General;  Laterality: N/A;  . SHOULDER ARTHROSCOPY WITH SUBACROMIAL DECOMPRESSION AND BICEP TENDON REPAIR Right 04/23/2013   Procedure: SHOULDER ARTHROSCOPY WITH SUBACROMIAL DECOMPRESSION AND BICEP TENDON TENOTOMY;  Surgeon: Mable Paris, MD;  Location:  [facility];  Service: Orthopedics;  Laterality: Right;    Social History   Social History  . Marital status: Single    Spouse name: N/A  . Number of children: 0  . Years of education: 70   Occupational History  . Postal Solicitor    Social History Main Topics  . Smoking status: Former Smoker    Packs/day: 0.50    Years: 20.00    Types: Cigarettes     Start date: 04/30/2012    Quit date: 08/16/2012  . Smokeless tobacco: Never Used  . Alcohol use No  . Drug use: No  . Sexual activity: No     Comment: was smoking 1/2 pppd   Other Topics Concern  . None   Social History Narrative   Lives at home alone.   Right-handed.   2 cups caffeine per day.    ROS  See hpi  Objective: Vitals:   10/13/16 1606  BP: 129/77  Pulse: (!) 105  Resp: 18  Temp: 98.5 F (36.9 C)  TempSrc: Oral  SpO2: 93%  Weight: 150 lb (68 kg)  Height: 5' (1.524 m)   Diabetic Foot Exam - Simple   Simple Foot Form Diabetic Foot exam was performed with the following findings:  Yes 10/13/2016  4:48 PM  Visual Inspection No deformities, no ulcerations, no other skin breakdown bilaterally:  Yes Sensation  Testing Intact to touch and monofilament testing bilaterally:  Yes Pulse Check Posterior Tibialis and Dorsalis pulse intact bilaterally:  Yes Comments     Physical Exam  Constitutional: She is oriented to person, place, and time. She appears well-developed and well-nourished.  HENT:  Head: Normocephalic and atraumatic.  Nose: Nose normal.  Mouth/Throat: Oropharynx is clear and moist.  Eyes: Conjunctivae and EOM are normal. Pupils are equal, round, and reactive to light.  Neck: Normal range of motion. Neck supple.  Cardiovascular: Normal rate, regular rhythm, normal heart sounds and intact distal pulses.   No murmur heard. Pulmonary/Chest: Effort normal and breath sounds normal. No respiratory distress. She has no wheezes. She has no rales.  Musculoskeletal: Normal range of motion. She exhibits no edema.  Neurological: She is alert and oriented to person, place, and time.  Skin: Skin is warm. Capillary refill takes less than 2 seconds. No erythema.  Psychiatric: She has a normal mood and affect. Her behavior is normal. Judgment and thought content normal.    Assessment and Plan Meghan Hickman was seen today for establish care.  Diagnoses and all orders for  this visit:  Type 2 diabetes mellitus without complication, without long-term current use of insulin (HCC) -     Comprehensive metabolic panel -     Pneumococcal conjugate vaccine 13-valent IM  Essential hypertension -     Comprehensive metabolic panel  Mixed hyperlipidemia -     Lipid panel -     TSH -     Comprehensive metabolic panel  Need for prophylactic vaccination against Streptococcus pneumoniae (pneumococcus) -     Pneumococcal conjugate vaccine 13-valent IM  Moderate episode of recurrent major depressive disorder (HCC)  Chronic migraine without aura without status migrainosus, not intractable  Primary insomnia  Controlled type 2 diabetes mellitus without complication, without long-term current use of insulin (HCC)   Problem List Items Addressed This Visit      Cardiovascular and Mediastinum   Migraine    Currently on Gapabentin with Axert  Pt reports improvement in symptoms.       Essential hypertension    Continue ARB which helps with diabetes as well.  Hyzaar is controlling bp well.  DASH diet encouraged.       Relevant Orders   Comprehensive metabolic panel     Endocrine   Type 2 diabetes mellitus, controlled (HCC)    Pt on metformin for monotherapy. Well controlled. Last a1c at goal. Prevnar given today. Continue ADA diet        Other   Moderate episode of recurrent major depressive disorder (HCC)    Stable on lexapro. Continue stress mgmt.       Insomnia    Insomnia treated with Temazepam. Continue follow up with Specialist. Pt should also continue to practice good sleep hygiene.       Other Visit Diagnoses    Type 2 diabetes mellitus without complication, without long-term current use of insulin (HCC)    -  Primary   Relevant Orders   Comprehensive metabolic panel   Pneumococcal conjugate vaccine 13-valent IM   Mixed hyperlipidemia       Relevant Orders   Lipid panel   TSH   Comprehensive metabolic panel   Need for prophylactic  vaccination against Streptococcus pneumoniae (pneumococcus)       Relevant Orders   Pneumococcal conjugate vaccine 13-valent IM       Meghan Hickman

## 2016-10-13 NOTE — Patient Instructions (Signed)
     IF you received an x-ray today, you will receive an invoice from [facility]. Please contact [facility] at [phone] with questions or concerns regarding your invoice.   IF you received labwork today, you will receive an invoice from [facility]. Please contact [facility] at [phone] with questions or concerns regarding your invoice.   Our billing staff will not be able to assist you with questions regarding bills from these companies.  You will be contacted with the lab results as soon as they are available. The fastest way to get your results is to activate your My Chart account. Instructions are located on the last page of this paperwork. If you have not heard from us regarding the results in 2 weeks, please contact this office.     

## 2016-10-13 NOTE — Assessment & Plan Note (Signed)
Stable on lexapro. Continue stress mgmt.

## 2016-10-13 NOTE — Assessment & Plan Note (Signed)
Continue ARB which helps with diabetes as well.  Hyzaar is controlling bp well.  DASH diet encouraged.

## 2016-10-13 NOTE — Assessment & Plan Note (Signed)
Currently on Gapabentin with Axert  Pt reports improvement in symptoms.

## 2016-10-14 ENCOUNTER — Encounter: Payer: Self-pay | Admitting: Family Medicine

## 2016-10-14 LAB — LIPID PANEL
CHOL/HDL RATIO: 3 ratio (ref 0.0–4.4)
CHOLESTEROL TOTAL: 163 mg/dL (ref 100–199)
HDL: 54 mg/dL (ref >39)
LDL CALC: 95 mg/dL (ref 0–99)
TRIGLYCERIDES: 68 mg/dL (ref 0–149)
VLDL CHOLESTEROL CAL: 14 mg/dL (ref 5–40)

## 2016-10-14 LAB — COMPREHENSIVE METABOLIC PANEL
A/G RATIO: 1.5 (ref 1.2–2.2)
ALBUMIN: 4.3 g/dL (ref 3.6–4.8)
ALK PHOS: 90 IU/L (ref 39–117)
ALT: 27 IU/L (ref 0–32)
AST: 28 IU/L (ref 0–40)
BILIRUBIN TOTAL: 0.7 mg/dL (ref 0.0–1.2)
BUN / CREAT RATIO: 12 (ref 12–28)
BUN: 11 mg/dL (ref 8–27)
CHLORIDE: 103 mmol/L (ref 96–106)
CO2: 26 mmol/L (ref 18–29)
Calcium: 9.7 mg/dL (ref 8.7–10.3)
Creatinine, Ser: 0.92 mg/dL (ref 0.57–1.00)
GFR calc Af Amer: 75 mL/min/{1.73_m2} (ref >59)
GFR calc non Af Amer: 65 mL/min/{1.73_m2} (ref >59)
GLOBULIN, TOTAL: 2.9 g/dL (ref 1.5–4.5)
Glucose: 95 mg/dL (ref 65–99)
POTASSIUM: 4.5 mmol/L (ref 3.5–5.2)
SODIUM: 145 mmol/L — AB (ref 134–144)
Total Protein: 7.2 g/dL (ref 6.0–8.5)

## 2016-10-14 LAB — TSH: TSH: 2.36 u[IU]/mL (ref 0.450–4.500)

## 2016-10-25 ENCOUNTER — Ambulatory Visit (INDEPENDENT_AMBULATORY_CARE_PROVIDER_SITE_OTHER): Payer: Federal, State, Local not specified - PPO | Admitting: Clinical

## 2016-10-25 ENCOUNTER — Encounter (HOSPITAL_COMMUNITY): Payer: Self-pay | Admitting: Clinical

## 2016-10-25 DIAGNOSIS — F331 Major depressive disorder, recurrent, moderate: Secondary | ICD-10-CM | POA: Diagnosis not present

## 2016-10-25 NOTE — Progress Notes (Signed)
Comprehensive Clinical Assessment (CCA) Note  10/25/2016 Meghan Hickman [id number]  Visit Diagnosis:      ICD-9-CM ICD-10-CM   1. Moderate episode of recurrent major depressive disorder (HCC) 296.32 F33.1       CCA Part One  Part One has been completed on paper by the patient.  (See scanned document in Chart Review)  CCA Part Two A  Intake/Chief Complaint:  CCA Intake With Chief Complaint CCA Part Two Date: 10/25/16 CCA Part Two Time: 1445 Chief Complaint/Presenting Problem: Depresssion, Grief, Having speach problem  Patients Currently Reported Symptoms/Problems: Depression began in 40's, got worse a few years ago when mother passed away (4 years ago of stroke) Recently been sick 2x with bronchitis, Brain injury is causing speach problems- for over a year Individual's Strengths: "I don't know" Individual's Preferences: "I just want to be not misserable, I want to beable to find a way to function  and do my daily activities." client asked what she said last time and asked that it be the same this time. Type of Services Patient Feels Are Needed: Individual Therapy   Mental Health Symptoms Depression:  Depression: Change in energy/activity, Difficulty Concentrating, Fatigue, Hopelessness, Irritability, Sleep (too much or little) (Trouble going to sleep, when I do finally go to sleep I have a hard time getting up. )  Mania:  Mania: N/A  Anxiety:   Anxiety: Worrying, Tension  Psychosis:  Psychosis: N/A  Trauma:  Trauma: N/A  Obsessions:  Obsessions: N/A  Compulsions:  Compulsions: N/A  Inattention:  Inattention: N/A  Hyperactivity/Impulsivity:  Hyperactivity/Impulsivity: N/A  Oppositional/Defiant Behaviors:  Oppositional/Defiant Behaviors: N/A  Borderline Personality:  Emotional Irregularity: Chronic feelings of emptiness, Mood lability  Other Mood/Personality Symptoms:      Mental Status Exam Appearance and self-care  Stature:  Stature: Small  Weight:  Weight: Average weight   Clothing:  Clothing: Casual  Grooming:  Grooming: Normal  Cosmetic use:  Cosmetic Use: None  Posture/gait:  Posture/Gait: Normal  Motor activity:  Motor Activity: Restless  Sensorium  Attention:  Attention: Normal  Concentration:  Concentration: Normal  Orientation:  Orientation: X5  Recall/memory:  Recall/Memory: Normal  Affect and Mood  Affect:  Affect: Depressed  Mood:  Mood: Depressed  Relating  Eye contact:  Eye Contact: Normal  Facial expression:  Facial Expression: Depressed  Attitude toward examiner:  Attitude Toward Examiner: Cooperative  Thought and Language  Speech flow: Speech Flow: Soft  Thought content:  Thought Content: Appropriate to mood and circumstances  Preoccupation:     Hallucinations:     Organization:     Company secretary of Knowledge:  Fund of Knowledge: Average  Intelligence:  Intelligence: Average  Abstraction:  Abstraction: Normal  Judgement:  Judgement: Fair  Dance movement psychotherapist:  Reality Testing: Realistic  Insight:  Insight: Fair  Decision Making:  Decision Making: Normal  Social Functioning  Social Maturity:  Social Maturity: Isolates  Social Judgement:  Social Judgement: Naive  Stress  Stressors:  Stressors: Grief/losses  Coping Ability:  Coping Ability: Overwhelmed, Horticulturist, commercial Deficits:     Supports:      Family and Psychosocial History: Family history Marital status: Single Are you sexually active?: No What is your sexual orientation?: Heterosexual  Has your sexual activity been affected by drugs, alcohol, medication, or emotional stress?: No Does patient have children?: No  Childhood History:  Childhood History By whom was/is the patient raised?: Grandparents, Both parents Additional childhood history information: Lived with my  Grandma and parents lived  down the road and I saw them everyday. I had sheltered childhood. I wasn't exposed to any harmful or bad things. Even as an adult I stray from movies that have  violence.  Description of patient's relationship with caregiver when they were a child: Grandmother - she was 53 when I was a kid. It was very close. Granfather - The same. What ever I wanted they gave me.  Mother - it was good but I was not as close to her then as I was an adult. Dad was close too - he died in my 12-21-22 How were you disciplined when you got in trouble as a child/adolescent?: not often Does patient have siblings?: Yes Number of Siblings: 3 Description of patient's current relationship with siblings: Duffy Rhody passed of stroke at age 24. 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 Did Garment/textile technologist From [employer]?: Yes Did You Attend College?: Yes What Type of College Degree Do you Have?: BS Bussiness  Did You Attend Graduate School?: No Did You Have An Individualized Education Program (IIEP): No Did You Have Any Difficulty At School?: No  Religion: Religion/Spirituality Are You A Religious Person?: Yes What is Your  Religious Affiliation?: Baptist How Might This Affect Treatment?: "I don't think it will."  Leisure/Recreation: Leisure / Recreation Leisure and Hobbies: "plants, I plant trees bushes etc."  Exercise/Diet: Exercise/Diet Do You Exercise?: Yes What Type of Exercise Do You Do?: Run/Walk How Many Times a Week Do You Exercise?: 1-3 times a week Have You Gained or Lost A Significant Amount of Weight in the Past Six Months?: No Do You Follow a Special Diet?: No Do You Have Any Trouble Sleeping?: Yes  CCA Part Two C  Alcohol/Drug Use: Alcohol / Drug Use Pain Medications: See Chart Prescriptions: See Chart Over the Counter: See Chart History of alcohol / drug use?: No history of alcohol / drug abuse                      CCA Part Three  ASAM's:  Six Dimensions of Multidimensional Assessment  Dimension 1:  Acute Intoxication and/or Withdrawal Potential:     Dimension 2:  Biomedical Conditions and Complications:     Dimension 3:  Emotional, Behavioral, or Cognitive Conditions and Complications:     Dimension 4:  Readiness to Change:     Dimension 5:  Relapse, Continued use, or Continued Problem Potential:     Dimension 6:  Recovery/Living Environment:      Substance use Disorder (SUD)  Social Function:  Social Functioning Social Maturity: Isolates Social Judgement: Naive  Stress:  Stress Stressors: Grief/losses Coping Ability: Overwhelmed, Exhausted Patient Takes Medications The Way The Doctor Instructed?: Yes Priority Risk: Low Acuity  Risk Assessment- Self-Harm Potential: Risk Assessment For Self-Harm Potential Thoughts of Self-Harm: No current thoughts Method: No plan Availability of Means: No access/NA  Risk Assessment -Dangerous to Others Potential: Risk Assessment For Dangerous to Others Potential Method: No Plan Availability of Means: No access or NA Intent: Vague intent or NA Notification Required: No need or identified person  DSM5  Diagnoses: Patient Active Problem List   Diagnosis Date Noted  . Migraine 10/13/2016  . Insomnia 10/13/2016  . Essential hypertension 10/13/2016  . Moderate episode of recurrent major depressive disorder (HCC) 05/12/2016  . Small vessel disease, cerebrovascular 12/22/2015  . Slurred speech 07/24/2015  . Abnormal CT of brain 07/08/2015  . Speech abnormality 06/20/2015  . Thyroid nodule 06/20/2015  . S/P laparoscopic cholecystectomy 12/05/2014  . Heart murmur 02/11/2013  . Type 2 diabetes mellitus, controlled (HCC) 02/09/2013  . Other and unspecified hyperlipidemia 02/09/2013    Patient Centered Plan: Patient is on the following Treatment Plan(s):  Treatment plan to be formulated at next session Individual therapy 1x every 1-2 weeks, sessions to become less frequent as symptoms improve  Recommendations for Services/Supports/Treatments: Recommendations for Services/Supports/Treatments Recommendations For Services/Supports/Treatments: Individual Therapy, Medication Management  Treatment Plan Summary:    Referrals to Alternative Service(s): Referred to Alternative Service(s):   Place:   Date:   Time:    Referred to Alternative Service(s):   Place:   Date:   Time:    Referred to Alternative Service(s):   Place:   Date:   Time:    Referred to Alternative Service(s):   Place:   Date:   Time:     Kimela Malstrom A

## 2016-10-27 DIAGNOSIS — H43392 Other vitreous opacities, left eye: Secondary | ICD-10-CM | POA: Diagnosis not present

## 2016-10-27 DIAGNOSIS — H43811 Vitreous degeneration, right eye: Secondary | ICD-10-CM | POA: Diagnosis not present

## 2016-10-27 DIAGNOSIS — H35352 Cystoid macular degeneration, left eye: Secondary | ICD-10-CM | POA: Diagnosis not present

## 2016-10-27 DIAGNOSIS — H35372 Puckering of macula, left eye: Secondary | ICD-10-CM | POA: Diagnosis not present

## 2016-11-06 ENCOUNTER — Other Ambulatory Visit: Payer: Self-pay | Admitting: Physician Assistant

## 2016-11-06 DIAGNOSIS — R1013 Epigastric pain: Secondary | ICD-10-CM

## 2016-11-08 ENCOUNTER — Ambulatory Visit (HOSPITAL_COMMUNITY): Payer: Self-pay | Admitting: Clinical

## 2016-11-08 ENCOUNTER — Telehealth (HOSPITAL_COMMUNITY): Payer: Self-pay

## 2016-11-08 NOTE — Telephone Encounter (Signed)
Medication management - Telephone call with patient's [facility] on [address] with Zella Ball, pharmacist to inform patient would need to schedule an appointment for any further refills.  Agreed to request from Dr. Lolly Mustache upon his return from vacation if patient schedules an appointment as directed.

## 2016-11-08 NOTE — Telephone Encounter (Signed)
No refill unless she has follow up appointment. If she needs refill, it is preferable to be ordered by covering doctor in [place], where Dr. Lolly Mustache works.

## 2016-11-08 NOTE — Telephone Encounter (Signed)
Medication refill requests - Fax received from [facility] on [address] for refills of patient's past prescribed Lexapro and Trazodone. Patient last seen 05/12/16 and pt. no showed 08/18/16 for Dr. Lolly Mustache.  No current appt. scheduled.

## 2016-11-15 ENCOUNTER — Ambulatory Visit (INDEPENDENT_AMBULATORY_CARE_PROVIDER_SITE_OTHER): Payer: Federal, State, Local not specified - PPO | Admitting: Clinical

## 2016-11-15 ENCOUNTER — Encounter (HOSPITAL_COMMUNITY): Payer: Self-pay | Admitting: Clinical

## 2016-11-15 DIAGNOSIS — F331 Major depressive disorder, recurrent, moderate: Secondary | ICD-10-CM | POA: Diagnosis not present

## 2016-11-15 NOTE — Progress Notes (Signed)
   THERAPIST PROGRESS NOTE  Session Time: 4:45 -5:30  Participation Level: Active  Behavioral Response: CasualAlertDepressed  Type of Therapy: Individual Therapy  Treatment Goals addressed: improve psychiatric symptoms,  Improve unhelpful thought patterns  learn about diagnosis, healthy coping skills  Interventions: cbt, motivational interviewing, psychoeducation, grounding and mindfulness techniques,   Summary: Meghan Hickman is a 65  y.o. female who presents with Major Depressive Disorder, moderate  Suicidal/Homicidal: No, without intent/plan  Therapist Response: Meghan Hickman met with clinician for an individual therapy session she discussed her psychiatric symptoms, her current life events and her goals for therapy. Meghan Hickman shared that she has been depressed over the past week.  She shared that she feels lonely and has been missing the people who are gone. Clinician had given her a homework packet Depression 1 &2 after her assessment. She had completed the packet. Client and clinician reviewed and discussed the packet. Client and clinician discussed her diagnosis and her symptoms. Clinician presented some basic cbt concepts. Client and clinician discussed the thought emotion connection. Client and clinician agreed to discuss this further at future sessions. Clinician introduced some grounding and mindfulness techniques. Clinician explained the process, purpose and practice of the techniques. Client and clinician practiced some of the techniques together. Clinician gave Meghan Hickman packet 3 of depression which she agreed to complete before next session as well as to practice her grounding and mindfulness techniques daily.  Plan: Return again in 2-3 weeks.  Diagnosis: Axis I: Major Depressive Disorder, moderate    Meghan Hickman A, LCSW 11/15/2016

## 2016-11-16 ENCOUNTER — Other Ambulatory Visit (HOSPITAL_COMMUNITY): Payer: Self-pay | Admitting: Psychiatry

## 2016-11-16 DIAGNOSIS — F331 Major depressive disorder, recurrent, moderate: Secondary | ICD-10-CM

## 2016-11-16 MED ORDER — ESCITALOPRAM OXALATE 10 MG PO TABS: 20.0000 mg | ORAL_TABLET | Freq: Every day | ORAL | 0 refills | Status: DC

## 2016-12-02 ENCOUNTER — Ambulatory Visit (INDEPENDENT_AMBULATORY_CARE_PROVIDER_SITE_OTHER): Payer: Federal, State, Local not specified - PPO | Admitting: Clinical

## 2016-12-02 ENCOUNTER — Encounter (HOSPITAL_COMMUNITY): Payer: Self-pay | Admitting: Clinical

## 2016-12-02 DIAGNOSIS — F331 Major depressive disorder, recurrent, moderate: Secondary | ICD-10-CM | POA: Diagnosis not present

## 2016-12-02 NOTE — Progress Notes (Signed)
   THERAPIST PROGRESS NOTE  Session Time: 8:11 -9:00  Participation Level: Active  Behavioral Response: CasualAlertDepressed  Type of Therapy: Individual Therapy  Treatment Goals addressed: improve psychiatric symptoms, elevate mood,  learn about diagnosis, healthy coping skills  Interventions: cbt, motivational interviewing, psychoeducation,   Summary: Meghan Hickman is a 66  y.o. female who presents with Major Depressive Disorder, moderate  Suicidal/Homicidal: No, without intent/plan  Therapist Response: Trivia met with clinician for an individual therapy session she discussed her psychiatric symptoms, her current life events and her homework. [name] shared she was doing  A"tiny bit better." she shared that while she completed her homework she did not bring it. Client and clinician discussed the content of her homework and her diagnosis and symptoms. She shared that she has not wanted to go out and has had continued depressive symptoms.Clinician asked open ended questions and [name] shared about her symptoms and clinician shared about her diagnosis. She shared that she was not sure if the depressive symptoms were a part of her diagnosis or due to her stroke Clinician encouraged her to follow up with her doctors. Client and clinician discussed healthy coping skills - such as positive social interaction.    Clinician gave her packet 5 on depression.   Plan: Return again in 2-3 weeks.  Diagnosis: Axis I: Major Depressive Disorder, moderate    Fate Galanti A, LCSW 12/02/2016

## 2016-12-07 DIAGNOSIS — H35352 Cystoid macular degeneration, left eye: Secondary | ICD-10-CM | POA: Diagnosis not present

## 2016-12-07 DIAGNOSIS — H43813 Vitreous degeneration, bilateral: Secondary | ICD-10-CM | POA: Diagnosis not present

## 2016-12-07 DIAGNOSIS — H35372 Puckering of macula, left eye: Secondary | ICD-10-CM | POA: Diagnosis not present

## 2016-12-15 ENCOUNTER — Ambulatory Visit (INDEPENDENT_AMBULATORY_CARE_PROVIDER_SITE_OTHER): Payer: Federal, State, Local not specified - PPO | Admitting: Psychiatry

## 2016-12-15 ENCOUNTER — Encounter (HOSPITAL_COMMUNITY): Payer: Self-pay | Admitting: Psychiatry

## 2016-12-15 DIAGNOSIS — Z7982 Long term (current) use of aspirin: Secondary | ICD-10-CM

## 2016-12-15 DIAGNOSIS — Z79899 Other long term (current) drug therapy: Secondary | ICD-10-CM

## 2016-12-15 DIAGNOSIS — F331 Major depressive disorder, recurrent, moderate: Secondary | ICD-10-CM

## 2016-12-15 DIAGNOSIS — Z87891 Personal history of nicotine dependence: Secondary | ICD-10-CM

## 2016-12-15 MED ORDER — ESCITALOPRAM OXALATE 20 MG PO TABS: 20.0000 mg | ORAL_TABLET | Freq: Every day | ORAL | 2 refills | Status: DC

## 2016-12-15 NOTE — Progress Notes (Signed)
BH MD/PA/NP OP Progress Note  12/15/2016 1:26 PM Meghan Hickman  MRN:  [id number]  Chief Complaint:  Subjective:  I am doing good but sometime I don't take second Lexapro.  I'm sleeping better.  I'm concerned about my memory and his speech.  HPI: [name] came for her follow-up appointment.  She was last seen by Dr. Duane Lope in October.  At that time she was recommended to increase Lexapro 20 mg and her temazepam was discontinued and she was recommended to try trazodone.  Patient did not see any improvement with the trazodone and her primary care physician to resume temazepam.  She sleeping better.  She is taking Lexapro but sometime she does not take the second dose.  Her main concern is lack of motivation, lack of energy, memory issue and speech problem.  She was seen by neurologist for last year and she was told that she may have a phasia but she was not given any medication.  Patient is wondering if she can go back to see neurologist for speech therapy.  She still have at times slurred speech.  Patient denies any agitation, crying spells, suicidal thoughts or homicidal thought.  She denies any mania or psychosis.  Her vital signs are stable.  Visit Diagnosis:    ICD-9-CM ICD-10-CM   1. Moderate episode of recurrent major depressive disorder (HCC) 296.32 F33.1 escitalopram (LEXAPRO) 20 MG tablet    Past Psychiatric History: Reviewed. Patient denies any history of psychiatric inpatient treatment, suicidal attempt, mania, psychosis or any hallucination. She denies any history of PTSD, OCD or panic attacks. [facility] but developed high blood pressure.  She was given Depakote but limited effect.  We have tried trazodone in the past with limited effect.  Past Medical History:  Past Medical History:  Diagnosis Date  . Arthritis   . Depression   . Diabetes mellitus without complication (HCC)   . GERD (gastroesophageal reflux disease)    . Headache   . Heart murmur   . Hyperlipidemia   . Hypertension   . Speech problem   . Wears glasses     Past Surgical History:  Procedure Laterality Date  . CHOLECYSTECTOMY N/A 12/05/2014   Procedure: LAPAROSCOPIC CHOLECYSTECTOMY;  Surgeon: Emelia Loron, MD;  Location: [facility] OR;  Service: General;  Laterality: N/A;  . SHOULDER ARTHROSCOPY WITH SUBACROMIAL DECOMPRESSION AND BICEP TENDON REPAIR Right 04/23/2013   Procedure: SHOULDER ARTHROSCOPY WITH SUBACROMIAL DECOMPRESSION AND BICEP TENDON TENOTOMY;  Surgeon: Mable Paris, MD;  Location: [facility];  Service: Orthopedics;  Laterality: Right;    Family Psychiatric History: Reviewed.  Family History:  Family History  Problem Relation Age of Onset  . Stroke Mother   . Hypertension Mother   . Diabetes Mother   . Breast cancer Mother   . Healthy Father   . Heart disease Brother     Social History:  Social History   Social History  . Marital status: Single    Spouse name: N/A  . Number of children: 0  . Years of education: 38   Occupational History  . Postal Solicitor    Social History Main Topics  . Smoking status: Former Smoker    Packs/day: 0.50    Years: 20.00    Types: Cigarettes    Start date: 04/30/2012    Quit date: 08/16/2012  . Smokeless tobacco: Never Used  . Alcohol use No  . Drug use: No  .  Sexual activity: No     Comment: was smoking 1/2 pppd   Other Topics Concern  . None   Social History Narrative   Lives at home alone.   Right-handed.   2 cups caffeine per day.    Allergies: No Known Allergies  Metabolic Disorder Labs: Lab Results  Component Value Date   HGBA1C 5.9 08/19/2016   MPG 140 (H) 10/30/2015   No results found for: PROLACTIN Lab Results  Component Value Date   CHOL 163 10/13/2016   TRIG 68 10/13/2016   HDL 54 10/13/2016   CHOLHDL 3.0 10/13/2016   VLDL 20 10/30/2015   LDLCALC 95 10/13/2016   LDLCALC 84 10/30/2015     Current  Medications: Current Outpatient Prescriptions  Medication Sig Dispense Refill  . albuterol (PROVENTIL HFA;VENTOLIN HFA) 108 (90 Base) MCG/ACT inhaler Inhale 2 puffs into the lungs every 6 (six) hours as needed for wheezing or shortness of breath. 1 Inhaler 0  . almotriptan (AXERT) 12.5 MG tablet Take 12.5 mg by mouth daily as needed for migraine.   4  . aspirin 81 MG tablet Take 81 mg by mouth daily.    [name] Kitchen. atorvastatin (LIPITOR) 40 MG tablet TAKE 1 TABLET BY MOUTH EVERY DAY AT 6 PM 90 tablet 0  . escitalopram (LEXAPRO) 10 MG tablet Take 2 tablets (20 mg total) by mouth daily. 60 tablet 0  . estradiol (ESTRACE) 0.5 MG tablet Take 0.5 mg by mouth daily.  0  . gabapentin (NEURONTIN) 100 MG capsule Take 100 mg by mouth 3 (three) times daily.    [name] Kitchen. ipratropium (ATROVENT) 0.03 % nasal spray Place 2 sprays into both nostrils 2 (two) times daily. 30 mL 0  . losartan-hydrochlorothiazide (HYZAAR) 50-12.5 MG tablet TAKE 1 TABLET BY MOUTH DAILY. 90 tablet 0  . metFORMIN (GLUCOPHAGE) 500 MG tablet TAKE 1 TABLET BY MOUTH EVERY DAY 90 tablet 0  . Multiple Vitamins-Minerals (MULTIVITAMIN & MINERAL PO) Take 1 tablet by mouth daily.    . ranitidine (ZANTAC) 150 MG tablet 1 as needed daily 90 tablet 0  . temazepam (RESTORIL) 30 MG capsule TAKE 1 CAPSULE BY MOUTH AT BEDTIME AS NEEDED FOR SLEEP 30 capsule 0  . traMADol (ULTRAM) 50 MG tablet TAKE 1 TABLET BY MOUTH EVERY 6 HOURS AS NEEDED 30 tablet 5   No current facility-administered medications for this visit.     Neurologic: Headache: No Seizure: No Paresthesias: No  Musculoskeletal: Strength & Muscle Tone: within normal limits Gait & Station: normal Patient leans: N/A  Psychiatric Specialty Exam: ROS  Blood pressure 124/76, pulse 100, height 5' (1.524 m), weight 151 lb 3.2 oz (68.6 kg).Body mass index is 29.53 kg/m.  General Appearance: Casual  Eye Contact:  Good  Speech:  Slow  Volume:  Normal  Mood:  Anxious  Affect:  Appropriate  Thought  Process:  Goal Directed  Orientation:  Full (Time, Place, and Person)  Thought Content: Paranoid Ideation   Suicidal Thoughts:  No  Homicidal Thoughts:  No  Memory:  Immediate;   Fair Recent;   Fair Remote;   Fair  Judgement:  Good  Insight:  Good  Psychomotor Activity:  Normal  Concentration:  Concentration: Fair and Attention Span: Fair  Recall:  F[employer]Fair  Fund of Knowledge: Good  Language: Good  Akathisia:  No  Handed:  Right  AIMS (if indicated):  0  Assets:  Communication Skills Desire for Improvement Housing Resilience  ADL's:  Intact  Cognition: Impaired,  Mild  Sleep:  good  Assessment: Major depressive disorder, recurrent.  Plan: Patient is a stable on Lexapro.  I recommended to take 20 mg every day to help her residual depression and lack of motivation.  She is getting temazepam from her primary care physician.  I encouraged to see neurologist for speech issues and they can recommend speech therapy if needed.  Discussed medication side effects and benefits.  Recommended to call us back if she has any question, concern or if she feels worsening of the symptom.  Follow-up in 3 months.  Cheick Suhr T., MD 12/15/2016, 1:26 PM

## 2016-12-17 ENCOUNTER — Other Ambulatory Visit: Payer: Self-pay | Admitting: Physician Assistant

## 2016-12-17 DIAGNOSIS — E785 Hyperlipidemia, unspecified: Secondary | ICD-10-CM

## 2016-12-17 DIAGNOSIS — E119 Type 2 diabetes mellitus without complications: Secondary | ICD-10-CM

## 2016-12-17 DIAGNOSIS — I10 Essential (primary) hypertension: Secondary | ICD-10-CM

## 2016-12-22 ENCOUNTER — Ambulatory Visit (HOSPITAL_COMMUNITY): Payer: Self-pay | Admitting: Clinical

## 2016-12-29 ENCOUNTER — Ambulatory Visit (HOSPITAL_COMMUNITY): Payer: Self-pay | Admitting: Clinical

## 2017-01-17 ENCOUNTER — Encounter (HOSPITAL_COMMUNITY): Payer: Self-pay | Admitting: Clinical

## 2017-01-17 ENCOUNTER — Ambulatory Visit (INDEPENDENT_AMBULATORY_CARE_PROVIDER_SITE_OTHER): Payer: Federal, State, Local not specified - PPO | Admitting: Clinical

## 2017-01-17 DIAGNOSIS — F321 Major depressive disorder, single episode, moderate: Secondary | ICD-10-CM | POA: Diagnosis not present

## 2017-01-17 DIAGNOSIS — F331 Major depressive disorder, recurrent, moderate: Secondary | ICD-10-CM

## 2017-01-17 NOTE — Progress Notes (Signed)
   THERAPIST PROGRESS NOTE  Session Time: 4:30 -4:25  Participation Level: Active  Behavioral Response: CasualAlertDepressed  Type of Therapy: Individual Therapy  Treatment Goals addressed: improve psychiatric symptoms, elevate mood, healthy coping skills  Interventions: cbt, motivational interviewing,   Summary: [name] is a 66  y.o. female who presents with Major Depressive Disorder, moderate  Suicidal/Homicidal: No, without intent/plan  Therapist Response: [name] met with clinician for an individual therapy session she discussed her psychiatric symptoms, her current life events and her homework. [name] shared that she has had some good days and some challenging days with her symptoms. Clinician asked open ended questions She shared that she has been missing her mother. She shared that she is planning on retiring soon and she was planning to travel with her mother but her mother passed away. Clinician asked open ended questions about her hobbies etc. That she currently has (not very many). Clinician discussed depression and how a non-schedule could increase her depression. Clinician asked open ended questions and [name] identified some options on ways to spend her days when retired. Client and clinician discussed planning and having things to look forward. She shared that it felt good to feel like she had some direction. She shared that she had not finished her homework packet . She agreed to complete it before next session.   Plan: Return again in 2-3 weeks.  Diagnosis: Axis I: Major Depressive Disorder, moderate    [name], LCSW 01/17/2017

## 2017-02-02 ENCOUNTER — Ambulatory Visit (INDEPENDENT_AMBULATORY_CARE_PROVIDER_SITE_OTHER): Payer: Federal, State, Local not specified - PPO | Admitting: Clinical

## 2017-02-02 ENCOUNTER — Encounter (HOSPITAL_COMMUNITY): Payer: Self-pay | Admitting: Clinical

## 2017-02-02 DIAGNOSIS — F331 Major depressive disorder, recurrent, moderate: Secondary | ICD-10-CM

## 2017-02-02 DIAGNOSIS — F321 Major depressive disorder, single episode, moderate: Secondary | ICD-10-CM

## 2017-02-02 NOTE — Progress Notes (Signed)
   THERAPIST PROGRESS NOTE  Session Time: 10:06 -10:58  Participation Level: Active  Behavioral Response: CasualAlertDepressed  Type of Therapy: Individual Therapy  Treatment Goals addressed: improve psychiatric symptoms, elevate mood, Improve unhelpful thought patterns, learn about diagnosis, healthy coping skills  Interventions: cbt, motivational interviewing, psychoeducation,   Summary: Meghan Hickman is a 67  y.o. female who presents with Major Depressive Disorder, moderate  Suicidal/Homicidal: No, without intent/plan  Therapist Response: [name] met with clinician for an individual therapy session she discussed her psychiatric symptoms, her current life events and her homework. [name] shared she has been struggling. Clinician asked open ended questions and she shared that her thoughts are often negative and she has a lot of negative memories. She shared that she feels regretful a lot of the time. Client and clinician discussed the thought emotion connection. Client and clinician discussed how to interrupt negative thoughts. Clinician asked open ended questions and [name] shared some of her negative thoughts. Client and clinician used a 7 panel thought record sheet to challenge her thoughts. Clinician asked open ended questions and [name] identified her negative thoughts and emotions. [name] identified the evidence for and against the thoughts. She was then able to formulate healthier alternative thoughts. Client and clinician discussed how increased social interactions would help improve her symptoms. Client and clinician reviewed her homework packet and clinician printed her the next few packets. Clinician showed her where she could get them for free on the web. Because [name] had shared that procrastination is a problem for her clinician showed her where she could print packets to help with that. Clinician informed [name] she would be leaving [facility] soon. Clinician asked open ended questions  and [name] shared her thoughts and emotions. [name] and clinician discussed options for her to continue therapy with another clinician  Plan: Return again in 2-3 weeks.  Diagnosis: Axis I: Major Depressive Disorder, moderate   Seena Ritacco A, LCSW 02/02/2017

## 2017-02-04 ENCOUNTER — Other Ambulatory Visit: Payer: Self-pay | Admitting: Family Medicine

## 2017-02-04 DIAGNOSIS — R1013 Epigastric pain: Secondary | ICD-10-CM

## 2017-02-15 ENCOUNTER — Encounter (HOSPITAL_COMMUNITY): Payer: Self-pay | Admitting: Clinical

## 2017-02-15 ENCOUNTER — Ambulatory Visit (INDEPENDENT_AMBULATORY_CARE_PROVIDER_SITE_OTHER): Payer: Federal, State, Local not specified - PPO | Admitting: Clinical

## 2017-02-15 DIAGNOSIS — F331 Major depressive disorder, recurrent, moderate: Secondary | ICD-10-CM | POA: Diagnosis not present

## 2017-02-15 NOTE — Progress Notes (Signed)
   THERAPIST PROGRESS NOTE  Session Time: 4:29 - 5:25  Participation Level: Active  Behavioral Response: CasualAlertDepressed  Type of Therapy: Individual Therapy  Treatment Goals addressed: improve psychiatric symptoms, elevate mood, Improve unhelpful thought patterns ,  healthy coping skills  Interventions:  motivational interviewing, psychoeducation,  Summary: Comfort Iversen is a 66  y.o. female who presents with Major Depressive Disorder, moderate  Suicidal/Homicidal: No, without intent/plan  Therapist Response: Diania met with clinician for an individual therapy session she discussed her psychiatric symptoms and her current life events.  Clinician asked open ended questions and Aricela shared that she has been depressed but had realized that she needs to spend more time "living" rather than spending all her thoughts with those who have passed. "I need to start living in this world." Clinician agreed. Clinician asked open ended questions and Beau identified ways she could do so. She then almost immediately said why it wasn't possible. Client and clinician discussed Depression and depressive thoughts. Client and clinician used a 7 panel thought record sheet to challenge her negative thoughts. She identified the thoughts, her emotions ( fear). Clinician asked open ended questions and Kayline identified the evidence for and against the thoughts. She was then able to formulate healthier alternative thoughts. Client and clinician discussed fear and trying something new. Clinician asked open ended questions and Lyrique identified past times that she has done new things and overcame the fear. Client and clinician reviewed and discussed tools learned in therapy. This is Bonnye and clinician's last session together as discussed in previous session. Client and clinician bid farewell to each other.  Plan: Return again in 2-3 weeks.  Diagnosis: Axis I: Major Depressive Disorder, moderate   Kaiya Boatman  A, LCSW 02/15/2017

## 2017-02-25 DIAGNOSIS — E119 Type 2 diabetes mellitus without complications: Secondary | ICD-10-CM | POA: Diagnosis not present

## 2017-02-25 DIAGNOSIS — H35342 Macular cyst, hole, or pseudohole, left eye: Secondary | ICD-10-CM | POA: Diagnosis not present

## 2017-02-25 DIAGNOSIS — H33192 Other retinoschisis and retinal cysts, left eye: Secondary | ICD-10-CM | POA: Diagnosis not present

## 2017-02-25 DIAGNOSIS — H35372 Puckering of macula, left eye: Secondary | ICD-10-CM | POA: Diagnosis not present

## 2017-03-01 ENCOUNTER — Ambulatory Visit (HOSPITAL_COMMUNITY): Payer: Self-pay | Admitting: Clinical

## 2017-03-09 ENCOUNTER — Other Ambulatory Visit: Payer: Self-pay

## 2017-03-09 NOTE — Telephone Encounter (Signed)
Refill request for metformin hcl 500 mg tablet approved for 90 day supply with no refills.  Pharmacy advised to let patient know she needs to call office and schedule appt for DM followup.  dg

## 2017-03-10 ENCOUNTER — Telehealth: Payer: Self-pay | Admitting: Family Medicine

## 2017-03-10 MED ORDER — METFORMIN HCL 500 MG PO TABS: 500.0000 mg | ORAL_TABLET | Freq: Every day | ORAL | 0 refills | Status: DC

## 2017-03-10 NOTE — Telephone Encounter (Signed)
Refilled. Patient Notified. Needs OV for Additional Refills.

## 2017-03-10 NOTE — Telephone Encounter (Signed)
Pt calling stating that the pharmacy had sent over a request for Metformin last week and they haven't heard anything from us she is completely out of her Metformin

## 2017-03-15 ENCOUNTER — Ambulatory Visit (HOSPITAL_COMMUNITY): Payer: Self-pay | Admitting: Clinical

## 2017-03-17 ENCOUNTER — Ambulatory Visit (INDEPENDENT_AMBULATORY_CARE_PROVIDER_SITE_OTHER): Payer: Federal, State, Local not specified - PPO | Admitting: Psychiatry

## 2017-03-17 ENCOUNTER — Encounter (HOSPITAL_COMMUNITY): Payer: Self-pay | Admitting: Psychiatry

## 2017-03-17 DIAGNOSIS — Z87891 Personal history of nicotine dependence: Secondary | ICD-10-CM | POA: Diagnosis not present

## 2017-03-17 DIAGNOSIS — F331 Major depressive disorder, recurrent, moderate: Secondary | ICD-10-CM | POA: Diagnosis not present

## 2017-03-17 MED ORDER — ESCITALOPRAM OXALATE 20 MG PO TABS: 20.0000 mg | ORAL_TABLET | Freq: Every day | ORAL | 0 refills | Status: DC

## 2017-03-17 NOTE — Progress Notes (Signed)
BH MD/PA/NP OP Progress Note  03/17/2017 8:55 AM Meghan GardenerLinda M Hickman  MRN:  [id number]  Chief Complaint:  Chief Complaint    Follow-up     Subjective:  I am doing better with the medication.  I'm not getting irritable.  HPI: Meghan Hickman came for her follow-up appointment.  She is taking Lexapro 20 mg daily.  She has noticed since taking 20 mg every day she has been less irritable, less angry and she is sleeping better.  She cut down her temazepam and only takes as needed.  Last week she took only once.  Her temazepam as prescribed by her primary care physician.  She scheduled to see primary care physician in coming weeks and hoping to get referral to get speech therapist.  She is still concerned about her speech and memory but no new issues.  She denies any crying spells, paranoia, feeling of hopelessness or worthlessness.  She denies any suicidal thoughts.  Her sleep is good.  Her energy level is better.  Patient denies drinking alcohol or using any illegal substances.  She continues to work night shift.  She was seeing Meghan Hickman but since she left she has not scheduled to see therapist anymore.  She feels she does not need it and realize if needed then she will restart counseling.  Visit Diagnosis:    ICD-10-CM   1. Moderate episode of recurrent major depressive disorder (HCC) F33.1 escitalopram (LEXAPRO) 20 MG tablet    Past Psychiatric History: Reviewed. Patient denies any history of psychiatric inpatient treatment, suicidal attempt, mania, psychosis or any hallucination. She denies any history of PTSD, OCD or panic attacks. She [facility] but developed high blood pressure.  She was given Depakote but limited effect.  We have tried trazodone in the past with limited effect.  Past Medical History:  Past Medical History:  Diagnosis Date  . Arthritis   . Depression   . Diabetes mellitus without complication (HCC)   . GERD (gastroesophageal  reflux disease)   . Headache   . Heart murmur   . Hyperlipidemia   . Hypertension   . Speech problem   . Wears glasses     Past Surgical History:  Procedure Laterality Date  . CHOLECYSTECTOMY N/A 12/05/2014   Procedure: LAPAROSCOPIC CHOLECYSTECTOMY;  Surgeon: Emelia LoronMatthew Wakefield, MD;  [facility] OR;  Service: General;  Laterality: N/A;  . SHOULDER ARTHROSCOPY WITH SUBACROMIAL DECOMPRESSION AND BICEP TENDON REPAIR Right 04/23/2013   Procedure: SHOULDER ARTHROSCOPY WITH SUBACROMIAL DECOMPRESSION AND BICEP TENDON TENOTOMY;  Surgeon: Mable ParisJustin William Chandler, MD;  Location: [facility];  Service: Orthopedics;  Laterality: Right;    Family Psychiatric History: Reviewed.  Family History:  Family History  Problem Relation Age of Onset  . Stroke Mother   . Hypertension Mother   . Diabetes Mother   . Breast cancer Mother   . Healthy Father   . Heart disease Brother     Social History:  Social History   Social History  . Marital status: Single    Spouse name: N/A  . Number of children: 0  . Years of education: 3216   Occupational History  . Postal SolicitorClerk    Social History Main Topics  . Smoking status: Former Smoker    Packs/day: 0.50    Years: 20.00    Types: Cigarettes    Start date: 04/30/2012    Quit date: 08/16/2012  . Smokeless tobacco: Never Used  . Alcohol use  No  . Drug use: No  . Sexual activity: No     Comment: was smoking 1/2 pppd   Other Topics Concern  . None   Social History Narrative   Lives at home alone.   Right-handed.   2 cups caffeine per day.    Allergies: No Known Allergies  Metabolic Disorder Labs: Lab Results  Component Value Date   HGBA1C 5.9 08/19/2016   MPG 140 (H) 10/30/2015   No results found for: PROLACTIN Lab Results  Component Value Date   CHOL 163 10/13/2016   TRIG 68 10/13/2016   HDL 54 10/13/2016   CHOLHDL 3.0 10/13/2016   VLDL 20 10/30/2015   LDLCALC 95 10/13/2016   LDLCALC 84 10/30/2015     Current  Medications: Current Outpatient Prescriptions  Medication Sig Dispense Refill  . albuterol (PROVENTIL HFA;VENTOLIN HFA) 108 (90 Base) MCG/ACT inhaler Inhale 2 puffs into the lungs every 6 (six) hours as needed for wheezing or shortness of breath. 1 Inhaler 0  . almotriptan (AXERT) 12.5 MG tablet Take 12.5 mg by mouth daily as needed for migraine.   4  . aspirin 81 MG tablet Take 81 mg by mouth daily.    [name] Kitchen atorvastatin (LIPITOR) 40 MG tablet TAKE 1 TABLET BY MOUTH EVERY DAY AT 6 PM 90 tablet 0  . escitalopram (LEXAPRO) 20 MG tablet Take 1 tablet (20 mg total) by mouth daily. 90 tablet 0  . estradiol (ESTRACE) 0.5 MG tablet Take 0.5 mg by mouth daily.  0  . gabapentin (NEURONTIN) 100 MG capsule Take 100 mg by mouth 3 (three) times daily.    [name] Kitchen ipratropium (ATROVENT) 0.03 % nasal spray Place 2 sprays into both nostrils 2 (two) times daily. 30 mL 0  . losartan-hydrochlorothiazide (HYZAAR) 50-12.5 MG tablet TAKE 1 TABLET BY MOUTH DAILY. 90 tablet 0  . metFORMIN (GLUCOPHAGE) 500 MG tablet Take 1 tablet (500 mg total) by mouth daily. NEEDS OFFICE VISIT FOR ADDITIONAL REFILLS 30 tablet 0  . Multiple Vitamins-Minerals (MULTIVITAMIN & MINERAL PO) Take 1 tablet by mouth daily.    . ranitidine (ZANTAC) 150 MG tablet TAKE 1 TABLET BY MOUTH EVERY DAY AS NEEDED 90 tablet 0  . temazepam (RESTORIL) 30 MG capsule TAKE 1 CAPSULE BY MOUTH AT BEDTIME AS NEEDED FOR SLEEP 30 capsule 0  . traMADol (ULTRAM) 50 MG tablet TAKE 1 TABLET BY MOUTH EVERY 6 HOURS AS NEEDED 30 tablet 5   No current facility-administered medications for this visit.     Neurologic: Headache: No Seizure: No Paresthesias: No  Musculoskeletal: Strength & Muscle Tone: within normal limits Gait & Station: normal Patient leans: N/A  Psychiatric Specialty Exam: ROS  Blood pressure 130/80, pulse (!) 103, height 5' (1.524 m), weight 149 lb (67.6 kg).Body mass index is 29.1 kg/m.  General Appearance: Casual  Eye Contact:  Fair  Speech:  Slow  and Mild aphasia   Volume:  Decreased  Mood:  Euthymic  Affect:  Restricted  Thought Process:  Goal Directed  Orientation:  Full (Time, Place, and Person)  Thought Content: Rumination   Suicidal Thoughts:  No  Homicidal Thoughts:  No  Memory:  Immediate;   Fair Recent;   Fair Remote;   Fair  Judgement:  Good  Insight:  Good  Psychomotor Activity:  Normal  Concentration:  Concentration: Fair and Attention Span: Fair  Recall:  Fair  Fund of Knowledge: Good  Language: Good  Akathisia:  No  Handed:  Right  AIMS (if indicated):  0  Assets:  Communication Skills Desire for Improvement Housing Resilience  ADL's:  Intact  Cognition: Impaired,  Mild  Sleep:  Good     Assessment: Major depressive disorder, recurrent.  Plan: Patient is doing much better since taking the Lexapro 20 mg every day.  I reminded that she should see neurologist for memory impairment and get a referral from primary care physician for speech therapy.  She's not taking temazepam at night which is prescribed by primary care physician.  Patient has no side effects from the Lexapro.  She has no rash, tremors shakes or any EPS.  Recommended to call us back if she feels worsening of the symptom.  Patient is not interested to continue counseling however agree if symptoms worse then she will consider therapy.  Follow-up in 3 months.  Daisha Filosa T., MD 03/17/2017, 8:55 AM

## 2017-03-27 IMAGING — US US BIOPSY
1 series · 6 of 6 positions shown · non-contrast
Comparison: US Soft tissue neck 06/26/2015

MEDICATIONS:
10 cc 1% lidocaine

COMPLICATIONS:
None immediate

INDICATION: Indeterminate thyroid nodule

Right nodule 3.8 cm
EXAM:
ULTRASOUND GUIDED FINE NEEDLE ASPIRATION OF INDETERMINATE THYROID
NODULE
TECHNIQUE: Informed written consent was obtained from the patient after a
discussion of the risks, benefits and alternatives to treatment.
Questions regarding the procedure were encouraged and answered. A
timeout was performed prior to the initiation of the procedure.

[Series 1: us biopsy · 0.05mm/px · 6 of 6 slices shown]
[im 1/6]
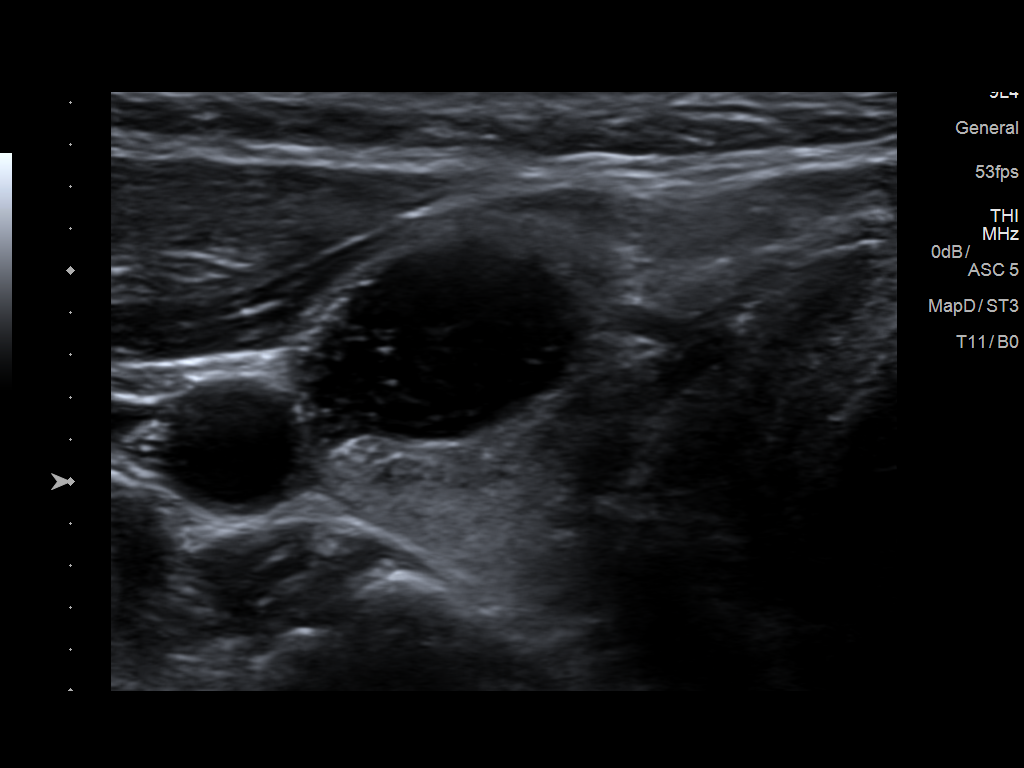
[im 2/6]
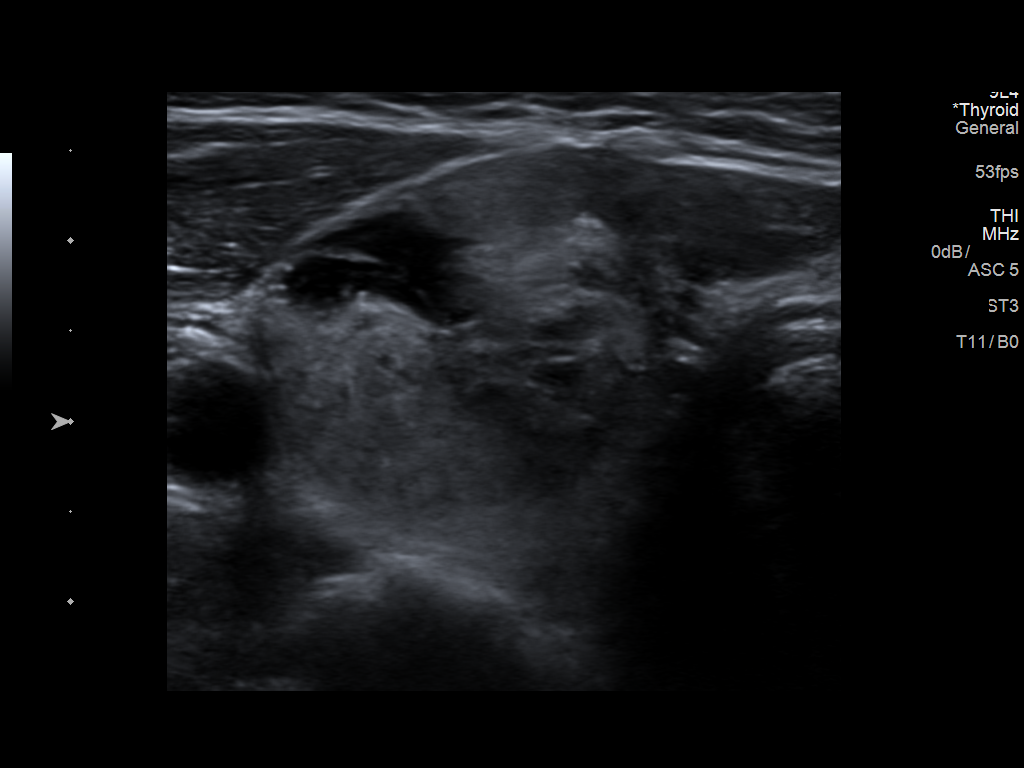
[im 3/6]
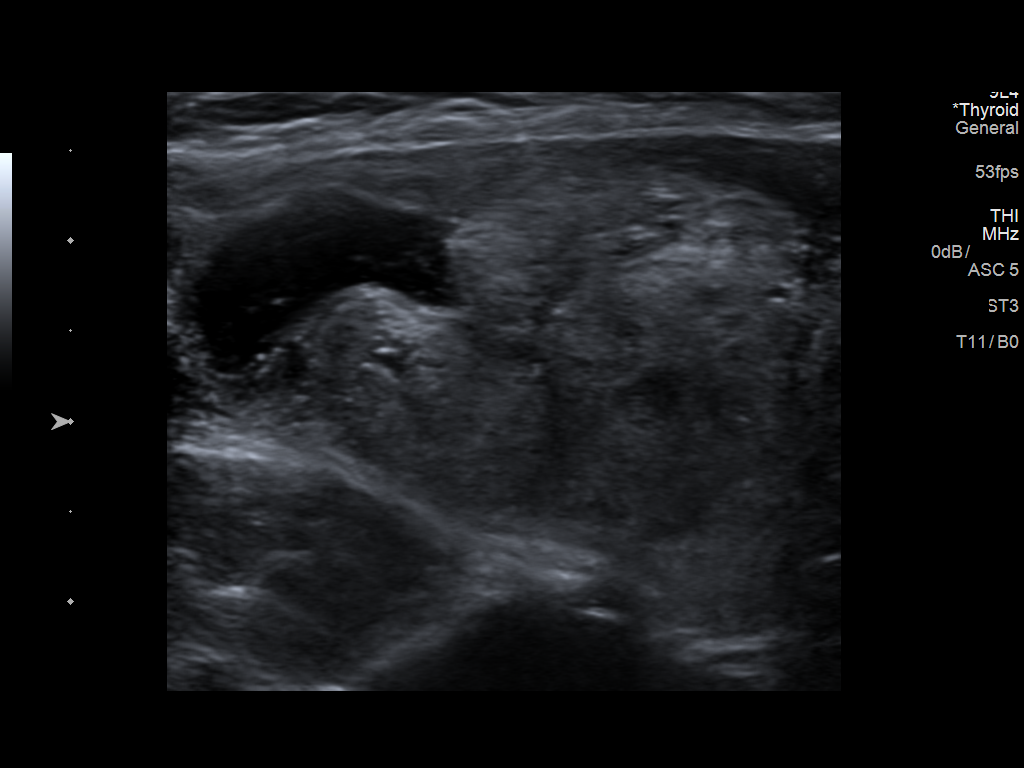
[im 4/6]
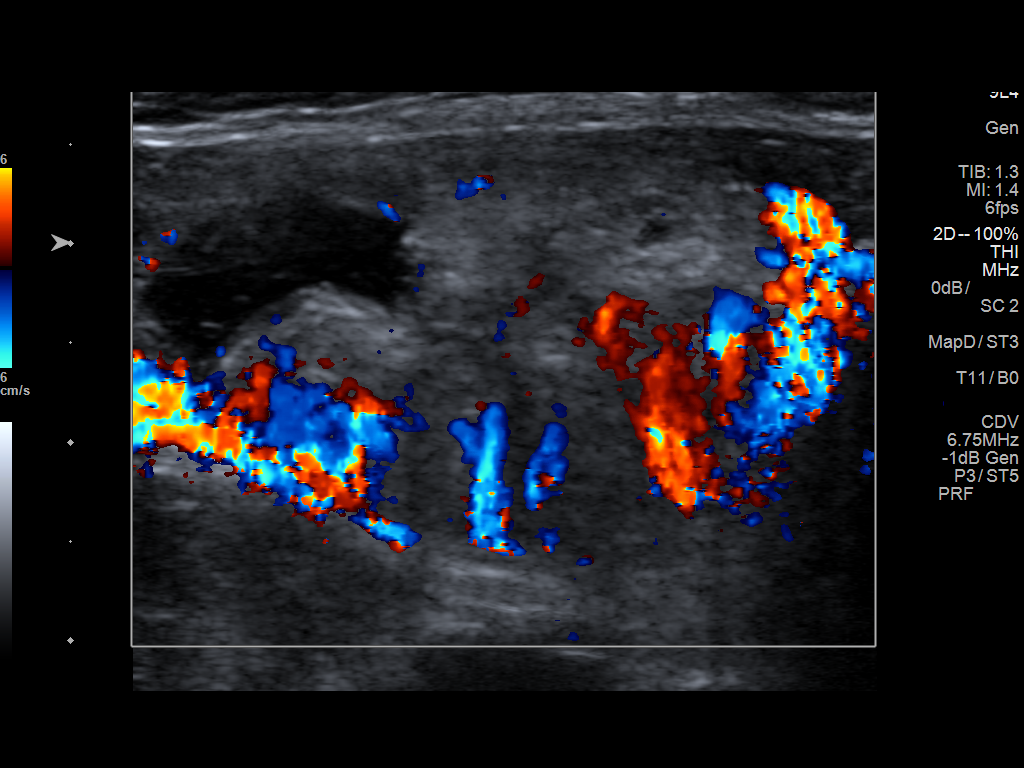
[im 5/6]
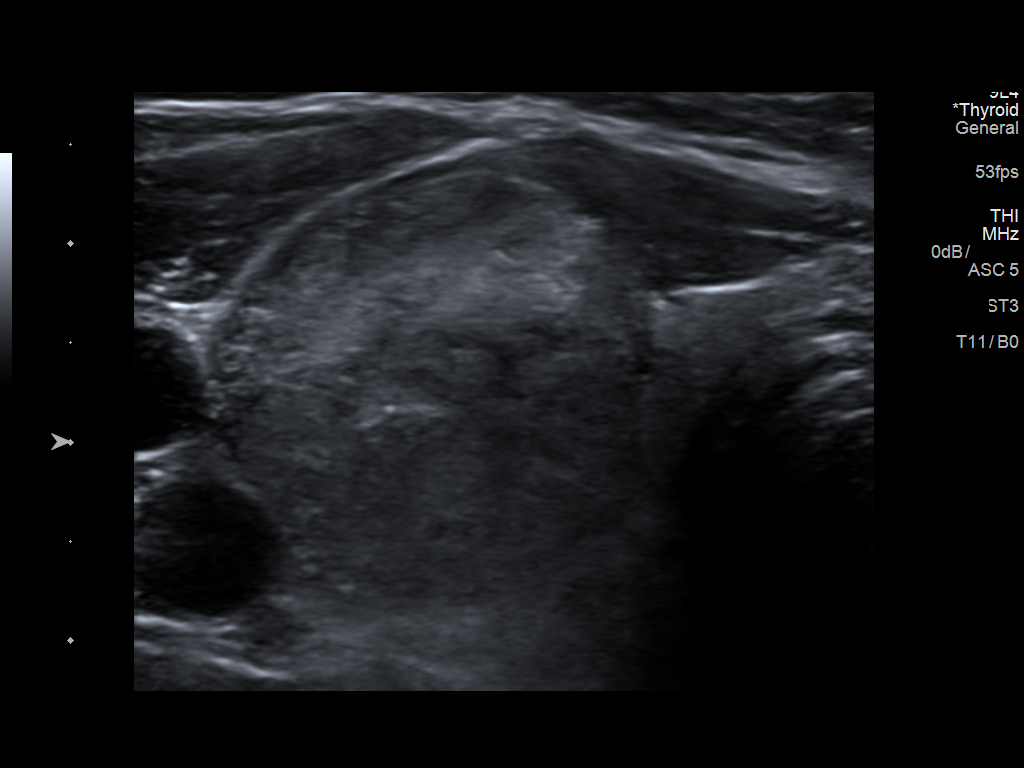
[im 6/6]
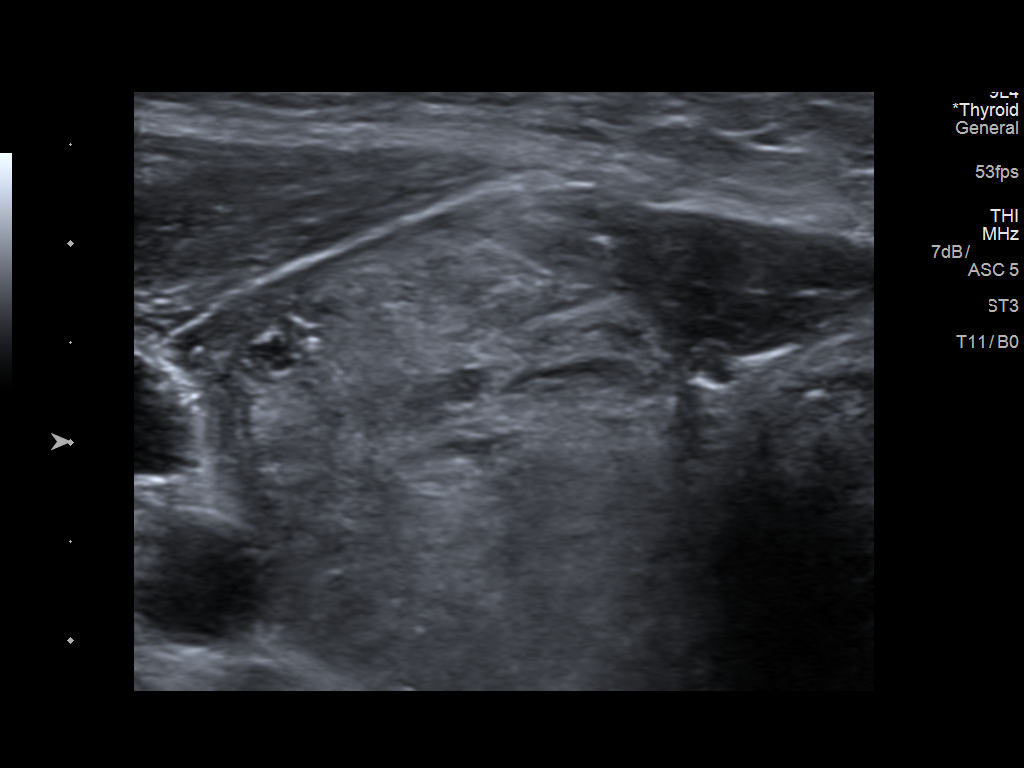

[6 of 6 positions shown; findings below may reference images not displayed]

Pre-procedural ultrasound scanning demonstrated Right thyroid
nodule: 3.8 cm

The procedure was planned. The neck was prepped in the usual sterile
fashion, and a sterile drape was applied covering the operative
field. A timeout was performed prior to the initiation of the
procedure. Local anesthesia was provided with 1% lidocaine.

Under direct ultrasound guidance, 4 FNA biopsies were performed of
the Rt thyroid nodule with a 25 gauge needle. The samples were
prepared and submitted to pathology.

Limited post procedural scanning was negative for hematoma or
additional complication. Dressings were placed. The patient
tolerated the above procedures procedure well without immediate
postprocedural complication.
IMPRESSION: Technically successful ultrasound guided fine needle aspiration of
Right thyroid nodule

Read by:  Champion Weldon

## 2017-04-20 ENCOUNTER — Ambulatory Visit: Payer: Federal, State, Local not specified - PPO | Admitting: Family Medicine

## 2017-04-20 NOTE — Progress Notes (Deleted)
No chief complaint on file.   HPI  Past Medical History:  Diagnosis Date  . Arthritis   . Depression   . Diabetes mellitus without complication (HCC)   . GERD (gastroesophageal reflux disease)   . Headache   . Heart murmur   . Hyperlipidemia   . Hypertension   . Speech problem   . Wears glasses     Current Outpatient Prescriptions  Medication Sig Dispense Refill  . albuterol (PROVENTIL HFA;VENTOLIN HFA) 108 (90 Base) MCG/ACT inhaler Inhale 2 puffs into the lungs every 6 (six) hours as needed for wheezing or shortness of breath. 1 Inhaler 0  . almotriptan (AXERT) 12.5 MG tablet Take 12.5 mg by mouth daily as needed for migraine.   4  . aspirin 81 MG tablet Take 81 mg by mouth daily.    [name] Kitchen. atorvastatin (LIPITOR) 40 MG tablet TAKE 1 TABLET BY MOUTH EVERY DAY AT 6 PM 90 tablet 0  . escitalopram (LEXAPRO) 20 MG tablet Take 1 tablet (20 mg total) by mouth daily. 90 tablet 0  . estradiol (ESTRACE) 0.5 MG tablet Take 0.5 mg by mouth daily.  0  . gabapentin (NEURONTIN) 100 MG capsule Take 100 mg by mouth 3 (three) times daily.    [name] Kitchen. ipratropium (ATROVENT) 0.03 % nasal spray Place 2 sprays into both nostrils 2 (two) times daily. 30 mL 0  . losartan-hydrochlorothiazide (HYZAAR) 50-12.5 MG tablet TAKE 1 TABLET BY MOUTH DAILY. 90 tablet 0  . metFORMIN (GLUCOPHAGE) 500 MG tablet Take 1 tablet (500 mg total) by mouth daily. NEEDS OFFICE VISIT FOR ADDITIONAL REFILLS 30 tablet 0  . Multiple Vitamins-Minerals (MULTIVITAMIN & MINERAL PO) Take 1 tablet by mouth daily.    . ranitidine (ZANTAC) 150 MG tablet TAKE 1 TABLET BY MOUTH EVERY DAY AS NEEDED 90 tablet 0  . temazepam (RESTORIL) 30 MG capsule TAKE 1 CAPSULE BY MOUTH AT BEDTIME AS NEEDED FOR SLEEP 30 capsule 0  . traMADol (ULTRAM) 50 MG tablet TAKE 1 TABLET BY MOUTH EVERY 6 HOURS AS NEEDED 30 tablet 5   No current facility-administered medications for this visit.     Allergies: No Known Allergies  Past Surgical History:  Procedure  Laterality Date  . CHOLECYSTECTOMY N/A 12/05/2014   Procedure: LAPAROSCOPIC CHOLECYSTECTOMY;  Surgeon: Emelia LoronMatthew Wakefield, MD;  [facility];  Service: General;  Laterality: N/A;  . SHOULDER ARTHROSCOPY WITH SUBACROMIAL DECOMPRESSION AND BICEP TENDON REPAIR Right 04/23/2013   Procedure: SHOULDER ARTHROSCOPY WITH SUBACROMIAL DECOMPRESSION AND BICEP TENDON TENOTOMY;  Surgeon: Mable ParisJustin William Chandler, MD;  Location: [facility];  Service: Orthopedics;  Laterality: Right;    Social History   Social History  . Marital status: Single    Spouse name: N/A  . Number of children: 0  . Years of education: 5316   Occupational History  . Postal SolicitorClerk    Social History Main Topics  . Smoking status: Former Smoker    Packs/day: 0.50    Years: 20.00    Types: Cigarettes    Start date: 04/30/2012    Quit date: 08/16/2012  . Smokeless tobacco: Never Used  . Alcohol use No  . Drug use: No  . Sexual activity: No     Comment: was smoking 1/2 pppd   Other Topics Concern  . Not on file   Social History Narrative   Lives at home alone.   Right-handed.   2 cups caffeine per day.    ROS  Objective: There were no vitals filed for this  visit.  Physical Exam  Assessment and Plan There are no diagnoses linked to this encounter.   Meghan Hickman P [employer]

## 2017-06-01 NOTE — Progress Notes (Signed)
Chief Complaint  Patient presents with  . Follow-up    dm and cholesterol    HPI   Diabetes Mellitus: Patient presents for follow up of diabetes. Symptoms: hypoglycemia . Symptoms have stabilized. Patient denies hyperglycemia, hypoglycemia , nausea and vomitting.  Evaluation to date has been included: hemoglobin A1C.  Home sugars: patient does not check sugars. Treatment to date: Continued metformin which has been effective.  She gets low sugars every week and has to eat candy to correct it. She typically eats cereal for breakfast and sandwiches for dinner She sticks to an ADA diet Lab Results  Component Value Date   HGBA1C 5.9 08/19/2016   Lab Results  Component Value Date   HGBA1C 6.0 06/02/2017   Hypertension: Patient here for follow-up of elevated blood pressure. She is exercising and is adherent to low salt diet.  Blood pressure is well controlled at home. Cardiac symptoms none. Patient denies chest pain, claudication, exertional chest pressure/discomfort, irregular heart beat, lower extremity edema and palpitations.  Cardiovascular risk factors: advanced age (older than 70 for men, 83 for women), diabetes mellitus, dyslipidemia and hypertension. Use of agents associated with hypertension: none. History of target organ damage: none.  BP Readings from Last 3 Encounters:  06/02/17 130/82  03/17/17 130/80  12/15/16 124/76   Hyperlipidemia: Patient presents with hyperlipidemia.  She was tested because screening.  Her last labs showed Total cholesterol see below. There is a family history of hyperlipidemia. There is a family history of early ischemia heart disease.  Lab Results  Component Value Date   CHOL 163 10/13/2016   CHOL 147 10/30/2015   CHOL 183 04/24/2015   Lab Results  Component Value Date   HDL 54 10/13/2016   HDL 43 (L) 10/30/2015   HDL 55 04/24/2015   Lab Results  Component Value Date   LDLCALC 95 10/13/2016   LDLCALC 84 10/30/2015   LDLCALC 111 04/24/2015    Lab Results  Component Value Date   TRIG 68 10/13/2016   TRIG 98 10/30/2015   TRIG 85 04/24/2015   Lab Results  Component Value Date   CHOLHDL 3.0 10/13/2016   CHOLHDL 3.4 10/30/2015   CHOLHDL 3.3 04/24/2015   No results found for: LDLDIRECT  Speech Changes Patient has been having about 1.5years of worsening speech with speech that is noticiable with word finding and fluency. She also has hard time saying words that are more than 2 syllables She had a brain scan 2016 for this same issue Now she is struggling with recall She can understand what people are saying and what she wants to say but just can't get it out very well She denies headaches, falls, or injuries. She reports that she has had anesthesia numerous times Her mother had a stroke when she was 19 She denies dementia in the family She reports that her mother had some memory loss after the stroke. She denies any physical weakness  Depression screen [facility] 2/9 06/02/2017 10/13/2016 08/19/2016 06/23/2016 12/18/2015  Decreased Interest 0 0 0 1 1  Down, Depressed, Hopeless 0 0 0 0 0  PHQ - 2 Score 0 0 0 1 1  Altered sleeping - - - - -  Tired, decreased energy - - - - -  Change in appetite - - - - -  Feeling bad or failure about yourself  - - - - -  Trouble concentrating - - - - -  Moving slowly or fidgety/restless - - - - -  Suicidal thoughts - - - - -  PHQ-9 Score - - - - -  Difficult doing work/chores - - - - -    MRI 07/23/2015 Mildly abnormal MRI brain without acute findings Noted to have 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 Arthritis   . Depression   . Diabetes mellitus without complication (HCC)   . GERD (gastroesophageal reflux disease)   . Headache   . Heart murmur   . Hyperlipidemia   . Hypertension   . Speech problem   . Wears glasses     Current Outpatient Prescriptions  Medication Sig Dispense  Refill  . albuterol (PROVENTIL HFA;VENTOLIN HFA) 108 (90 Base) MCG/ACT inhaler Inhale 2 puffs into the lungs every 6 (six) hours as needed for wheezing or shortness of breath. 1 Inhaler 0  . almotriptan (AXERT) 12.5 MG tablet Take 12.5 mg by mouth daily as needed for migraine.   4  . aspirin 81 MG tablet Take 81 mg by mouth daily.    [name] Kitchen atorvastatin (LIPITOR) 20 MG tablet TAKE 1 TABLET BY MOUTH EVERY DAY AT 6 PM 90 tablet 3  . escitalopram (LEXAPRO) 20 MG tablet Take 1 tablet (20 mg total) by mouth daily. 90 tablet 1  . estradiol (ESTRACE) 0.5 MG tablet Take 0.5 mg by mouth daily.  0  . gabapentin (NEURONTIN) 100 MG capsule Take 100 mg by mouth 3 (three) times daily.    [name] Kitchen ipratropium (ATROVENT) 0.03 % nasal spray Place 2 sprays into both nostrils 2 (two) times daily. 30 mL 0  . losartan-hydrochlorothiazide (HYZAAR) 50-12.5 MG tablet Take 1 tablet by mouth daily. 90 tablet 1  . Multiple Vitamins-Minerals (MULTIVITAMIN & MINERAL PO) Take 1 tablet by mouth daily.    . ranitidine (ZANTAC) 150 MG tablet Take 1 tablet (150 mg total) by mouth daily as needed. 90 tablet 3  . temazepam (RESTORIL) 30 MG capsule TAKE 1 CAPSULE BY MOUTH AT BEDTIME AS NEEDED FOR SLEEP 30 capsule 0  . traMADol (ULTRAM) 50 MG tablet TAKE 1 TABLET BY MOUTH EVERY 6 HOURS AS NEEDED 30 tablet 5   No current facility-administered medications for this visit.     Allergies: No Known Allergies  Past Surgical History:  Procedure Laterality Date  . CHOLECYSTECTOMY N/A 12/05/2014   Procedure: LAPAROSCOPIC CHOLECYSTECTOMY;  Surgeon: Emelia Loron, MD;  Location: [facility] OR;  Service: General;  Laterality: N/A;  . SHOULDER ARTHROSCOPY WITH SUBACROMIAL DECOMPRESSION AND BICEP TENDON REPAIR Right 04/23/2013   Procedure: SHOULDER ARTHROSCOPY WITH SUBACROMIAL DECOMPRESSION AND BICEP TENDON TENOTOMY;  Surgeon: Mable Paris, MD;  Location:  SUR[facility]ERY [facility];  Service: Orthopedics;  Laterality: Right;    Social History    Social History  . Marital status: Single    Spouse name: N/A  . Number of children: 0  . Years of education: 36   Occupational History  . Postal Solicitor    Social History Main Topics  . Smoking status: Former Smoker    Packs/day: 0.50    Years: 20.00    Types: Cigarettes    Start date: 04/30/2012    Quit date: 08/16/2012  . Smokeless tobacco: Never Used  . Alcohol use No  . Drug use: No  . Sexual activity: No     Comment: was smoking 1/2 pppd   Other Topics Concern  . None   Social History Narrative   Lives at home alone.   Right-handed.   2 cups caffeine per day.  Family History  Problem Relation Age of Onset  . Stroke Mother   . Hypertension Mother   . Diabetes Mother   . Breast cancer Mother   . Healthy Father   . Heart disease Brother      Review of Systems  Constitutional: Negative for chills, fever and weight loss.  HENT: Negative for sinus pain and tinnitus.   Eyes: Negative for double vision, photophobia and pain.  Respiratory: Negative for cough, shortness of breath, wheezing and stridor.   Cardiovascular: Negative for chest pain, palpitations and orthopnea.  Gastrointestinal: Negative for abdominal pain, nausea and vomiting.  Genitourinary: Negative for dysuria, frequency and urgency.  Musculoskeletal: Negative for myalgias and neck pain.  Skin: Negative for itching and rash.  Neurological: Positive for speech change. Negative for dizziness, tingling, tremors, sensory change, focal weakness, seizures, loss of consciousness and headaches.  Endo/Heme/Allergies: Negative for polydipsia. Does not bruise/bleed easily.  Psychiatric/Behavioral: Negative for depression, hallucinations, substance abuse and suicidal ideas. The patient is not nervous/anxious.        Objective: Vitals:   06/02/17 0803  BP: 130/82  Pulse: 92  Resp: 17  Temp: 98 F (36.7 C)  TempSrc: Oral  SpO2: 96%  Weight: 149 lb 12.8 oz (67.9 kg)  Height: 5' (1.524 m)     Physical Exam  Constitutional: She is oriented to person, place, and time. She appears well-developed and well-nourished.  HENT:  Head: Normocephalic and atraumatic.  Right Ear: External ear normal.  Left Ear: External ear normal.  Nose: Nose normal.  Mouth/Throat: Oropharynx is clear and moist.  Eyes: Conjunctivae and EOM are normal.  Neck: Normal range of motion. No thyromegaly present.  Cardiovascular: Normal rate, regular rhythm and normal heart sounds.   No murmur heard. Pulmonary/Chest: Effort normal and breath sounds normal. No respiratory distress. She has no wheezes. She has no rales. She exhibits no tenderness.  Abdominal: Soft. Bowel sounds are normal. She exhibits no distension and no mass. There is no tenderness. There is no rebound and no guarding. No hernia.  Musculoskeletal: Normal range of motion. She exhibits no edema.  Neurological: She is alert and oriented to person, place, and time. She displays normal reflexes. No cranial nerve deficit. Coordination normal.  Skin: Skin is warm. No erythema.  Psychiatric: She has a normal mood and affect. Her behavior is normal. Judgment and thought content normal.      Assessment and Plan Meghan Hickman was seen today for follow-up.  Diagnoses and all orders for this visit:  Diabetes mellitus without complication (HCC) Diabetes well controlled  -     POCT glycosylated hemoglobin (Hb A1C) -     Lipid panel -     Comprehensive metabolic panel  Need for prophylactic vaccination and inoculation against influenza -     Flu Vaccine QUAD 36+ mos IM  Essential hypertension Lab Results  Component Value Date   CREATININE 0.94 06/02/2017   Stable creatinine and electrolytes with good bp control -     Comprehensive metabolic panel -     losartan-hydrochlorothiazide (HYZAAR) 50-12.5 MG tablet; Take 1 tablet by mouth daily.  Mixed hyperlipidemia- lipid panel performed today Lab Results  Component Value Date   CHOL 169 06/02/2017    HDL 56 06/02/2017   LDLCALC 103 (H) 06/02/2017   TRIG 51 06/02/2017   CHOLHDL 3.0 06/02/2017   showed that she is well controlled  Med refilled today -     Lipid panel -     Comprehensive metabolic panel -  atorvastatin (LIPITOR) 20 MG tablet; TAKE 1 TABLET BY MOUTH EVERY DAY AT 6 PM  Moderate episode of recurrent major depressive disorder (HCC) -   Stable on lexapro as pseuedo-dementia is a concern -     escitalopram (LEXAPRO) 20 MG tablet; Take 1 tablet (20 mg total) by mouth daily.  Abdominal pain, epigastric  Difficulty communicating Rate of speech, slow Since the patient was evaluated in 2016 and stroke was ruled out she continues to have slowing speech and cognitive changes Concerning for processing disorder Does not seems consistent with dementia -     Ambulatory referral to Neuropsychology  Other orders -     Cancel: Flu Vaccine QUAD 36+ mos IM -     Cancel: Tdap vaccine greater than or equal to 7yo IM -     Cancel: MM Digital Screening; Future -     Cancel: Ambulatory referral to Gastroenterology -     Cancel: MM Digital Screening; Future -     Cancel: Ambulatory referral to Gastroenterology -     ranitidine (ZANTAC) 150 MG tablet; Take 1 tablet (150 mg total) by mouth daily as needed.  A total of 42 minutes were spent face-to-face with the patient during this encounter and over half of that time was spent on counseling and coordination of care.   Brendi Mccarroll A Cylas Falzone

## 2017-06-02 ENCOUNTER — Ambulatory Visit (INDEPENDENT_AMBULATORY_CARE_PROVIDER_SITE_OTHER): Payer: Federal, State, Local not specified - PPO | Admitting: Family Medicine

## 2017-06-02 ENCOUNTER — Encounter: Payer: Self-pay | Admitting: Family Medicine

## 2017-06-02 VITALS — BP 130/82 | HR 92 | Temp 98.0°F | Resp 17 | Ht 60.0 in | Wt 149.8 lb

## 2017-06-02 DIAGNOSIS — R4789 Other speech disturbances: Secondary | ICD-10-CM | POA: Diagnosis not present

## 2017-06-02 DIAGNOSIS — F809 Developmental disorder of speech and language, unspecified: Secondary | ICD-10-CM | POA: Diagnosis not present

## 2017-06-02 DIAGNOSIS — E119 Type 2 diabetes mellitus without complications: Secondary | ICD-10-CM

## 2017-06-02 DIAGNOSIS — I10 Essential (primary) hypertension: Secondary | ICD-10-CM | POA: Diagnosis not present

## 2017-06-02 DIAGNOSIS — Z23 Encounter for immunization: Secondary | ICD-10-CM

## 2017-06-02 DIAGNOSIS — F331 Major depressive disorder, recurrent, moderate: Secondary | ICD-10-CM | POA: Diagnosis not present

## 2017-06-02 DIAGNOSIS — E782 Mixed hyperlipidemia: Secondary | ICD-10-CM | POA: Diagnosis not present

## 2017-06-02 DIAGNOSIS — R1013 Epigastric pain: Secondary | ICD-10-CM | POA: Diagnosis not present

## 2017-06-02 LAB — POCT GLYCOSYLATED HEMOGLOBIN (HGB A1C): HEMOGLOBIN A1C: 6

## 2017-06-02 MED ORDER — ATORVASTATIN CALCIUM 20 MG PO TABS: ORAL_TABLET | ORAL | 3 refills | Status: DC

## 2017-06-02 MED ORDER — RANITIDINE HCL 150 MG PO TABS: 150.0000 mg | ORAL_TABLET | Freq: Every day | ORAL | 3 refills | Status: DC | PRN

## 2017-06-02 MED ORDER — ESCITALOPRAM OXALATE 20 MG PO TABS: 20.0000 mg | ORAL_TABLET | Freq: Every day | ORAL | 1 refills | Status: DC

## 2017-06-02 MED ORDER — LOSARTAN POTASSIUM-HCTZ 50-12.5 MG PO TABS: 1.0000 | ORAL_TABLET | Freq: Every day | ORAL | 1 refills | Status: DC

## 2017-06-02 NOTE — Patient Instructions (Addendum)
[facility] [address]  [address] [place], [place] [id number] Call Dr. Lorenda IshiharaMojeed [id number]([phone]     IF you received an x-ray today, you will receive an invoice from [facility]. Please contact [facility] at [phone] with questions or concerns regarding your invoice.   IF you received labwork today, you will receive an invoice from [facility]. Please contact [facility] at [phone] with questions or concerns regarding your invoice.   Our billing staff will not be able to assist you with questions regarding bills from these companies.  You will be contacted with the lab results as soon as they are available. The fastest way to get your results is to activate your My Chart account. Instructions are located on the last page of this paperwork. If you have not heard from us regarding the results in 2 weeks, please contact this office.

## 2017-06-03 ENCOUNTER — Encounter: Payer: Self-pay | Admitting: Family Medicine

## 2017-06-03 LAB — COMPREHENSIVE METABOLIC PANEL
ALK PHOS: 94 IU/L (ref 39–117)
ALT: 31 IU/L (ref 0–32)
AST: 47 IU/L — AB (ref 0–40)
Albumin/Globulin Ratio: 1.8 (ref 1.2–2.2)
Albumin: 4.5 g/dL (ref 3.6–4.8)
BUN/Creatinine Ratio: 17 (ref 12–28)
BUN: 16 mg/dL (ref 8–27)
Bilirubin Total: 0.6 mg/dL (ref 0.0–1.2)
CALCIUM: 9.7 mg/dL (ref 8.7–10.3)
CO2: 27 mmol/L (ref 20–29)
CREATININE: 0.94 mg/dL (ref 0.57–1.00)
Chloride: 104 mmol/L (ref 96–106)
GFR calc Af Amer: 73 mL/min/{1.73_m2} (ref >59)
GFR, EST NON AFRICAN AMERICAN: 63 mL/min/{1.73_m2} (ref >59)
GLOBULIN, TOTAL: 2.5 g/dL (ref 1.5–4.5)
GLUCOSE: 103 mg/dL — AB (ref 65–99)
Potassium: 4.2 mmol/L (ref 3.5–5.2)
Sodium: 145 mmol/L — ABNORMAL HIGH (ref 134–144)
Total Protein: 7 g/dL (ref 6.0–8.5)

## 2017-06-03 LAB — LIPID PANEL
CHOLESTEROL TOTAL: 169 mg/dL (ref 100–199)
Chol/HDL Ratio: 3 ratio (ref 0.0–4.4)
HDL: 56 mg/dL (ref >39)
LDL CALC: 103 mg/dL — AB (ref 0–99)
Triglycerides: 51 mg/dL (ref 0–149)
VLDL CHOLESTEROL CAL: 10 mg/dL (ref 5–40)

## 2017-06-05 ENCOUNTER — Other Ambulatory Visit: Payer: Self-pay | Admitting: Family Medicine

## 2017-06-05 DIAGNOSIS — E119 Type 2 diabetes mellitus without complications: Secondary | ICD-10-CM

## 2017-06-16 DIAGNOSIS — Z23 Encounter for immunization: Secondary | ICD-10-CM | POA: Diagnosis not present

## 2017-06-17 ENCOUNTER — Ambulatory Visit (HOSPITAL_COMMUNITY): Payer: Self-pay | Admitting: Psychiatry

## 2017-06-23 ENCOUNTER — Telehealth: Payer: Self-pay

## 2017-06-23 NOTE — Telephone Encounter (Signed)
Copied from CRM [id number]. Topic: Referral - Question >> Jun 22, 2017  4:47 PM Arlyss Gandyichardson, Taren N, NT wrote: Reason for CRM: Latoya from [facility] is calling and states they cannot treat this pt there for communicating problems or slow rate of speech. They state that it sounds like she needs a full psych eval and [facility] [place] would be a better office suited for this pt. Phone# to [facility]: [phone] [place] CB#: [phone]

## 2017-07-06 DIAGNOSIS — Z01419 Encounter for gynecological examination (general) (routine) without abnormal findings: Secondary | ICD-10-CM | POA: Diagnosis not present

## 2017-07-06 DIAGNOSIS — Z1231 Encounter for screening mammogram for malignant neoplasm of breast: Secondary | ICD-10-CM | POA: Diagnosis not present

## 2017-07-06 DIAGNOSIS — Z6829 Body mass index (BMI) 29.0-29.9, adult: Secondary | ICD-10-CM | POA: Diagnosis not present

## 2017-07-14 DIAGNOSIS — G43719 Chronic migraine without aura, intractable, without status migrainosus: Secondary | ICD-10-CM | POA: Diagnosis not present

## 2017-07-20 ENCOUNTER — Telehealth: Payer: Self-pay | Admitting: Family Medicine

## 2017-07-20 NOTE — Telephone Encounter (Signed)
Called pt and left a voice mail to call back to let pt know that I have sent her referral for Psychiatric to barbara farran on 12/12

## 2017-08-15 ENCOUNTER — Telehealth: Payer: Self-pay | Admitting: Neurology

## 2017-08-15 NOTE — Telephone Encounter (Signed)
Pt is asking for a call from Dr Terrace ArabiaYan or her RN re: her speech worsening.  Pt states she has had x rays and Brain scans done, she would like to know if Dr Terrace ArabiaYan can refer her to speech therapy.  Please call, pt has agreed to accept 1st available appointment to see Dr Terrace ArabiaYan, she is also on wait list to be called if anything opens up before

## 2017-08-15 NOTE — Telephone Encounter (Signed)
Patient requesting to see Dr. Terrace Meghan Hickman.  Last here in May 2017.  I have spoken with her and moved her appt to 09/15/17.  She will come back in for an evaluation and to discuss treatment options.  She was appreciative of the earlier date.

## 2017-08-18 DIAGNOSIS — E119 Type 2 diabetes mellitus without complications: Secondary | ICD-10-CM | POA: Diagnosis not present

## 2017-08-18 DIAGNOSIS — H26492 Other secondary cataract, left eye: Secondary | ICD-10-CM | POA: Diagnosis not present

## 2017-08-18 DIAGNOSIS — H35372 Puckering of macula, left eye: Secondary | ICD-10-CM | POA: Diagnosis not present

## 2017-08-27 NOTE — Telephone Encounter (Signed)
Pt has appointment with Neurology 09/15/2017

## 2017-09-05 ENCOUNTER — Ambulatory Visit: Payer: Federal, State, Local not specified - PPO | Admitting: Family Medicine

## 2017-09-05 NOTE — Progress Notes (Deleted)
No chief complaint on file.   HPI  4 review of systems  Past Medical History:  Diagnosis Date  . Arthritis   . Depression   . Diabetes mellitus without complication (HCC)   . GERD (gastroesophageal reflux disease)   . Headache   . Heart murmur   . Hyperlipidemia   . Hypertension   . Speech problem   . Wears glasses     Current Outpatient Medications  Medication Sig Dispense Refill  . albuterol (PROVENTIL HFA;VENTOLIN HFA) 108 (90 Base) MCG/ACT inhaler Inhale 2 puffs into the lungs every 6 (six) hours as needed for wheezing or shortness of breath. 1 Inhaler 0  . almotriptan (AXERT) 12.5 MG tablet Take 12.5 mg by mouth daily as needed for migraine.   4  . aspirin 81 MG tablet Take 81 mg by mouth daily.    [name] Kitchen atorvastatin (LIPITOR) 20 MG tablet TAKE 1 TABLET BY MOUTH EVERY DAY AT 6 PM 90 tablet 3  . escitalopram (LEXAPRO) 20 MG tablet Take 1 tablet (20 mg total) by mouth daily. 90 tablet 1  . estradiol (ESTRACE) 0.5 MG tablet Take 0.5 mg by mouth daily.  0  . gabapentin (NEURONTIN) 100 MG capsule Take 100 mg by mouth 3 (three) times daily.    [name] Kitchen ipratropium (ATROVENT) 0.03 % nasal spray Place 2 sprays into both nostrils 2 (two) times daily. 30 mL 0  . losartan-hydrochlorothiazide (HYZAAR) 50-12.5 MG tablet Take 1 tablet by mouth daily. 90 tablet 1  . metFORMIN (GLUCOPHAGE) 500 MG tablet TAKE 1 TABLET BY MOUTH EVERY DAY *SCHEDULE APPOINTMENT FOR FOLLOW UP* 90 tablet 0  . Multiple Vitamins-Minerals (MULTIVITAMIN & MINERAL PO) Take 1 tablet by mouth daily.    . ranitidine (ZANTAC) 150 MG tablet Take 1 tablet (150 mg total) by mouth daily as needed. 90 tablet 3  . temazepam (RESTORIL) 30 MG capsule TAKE 1 CAPSULE BY MOUTH AT BEDTIME AS NEEDED FOR SLEEP 30 capsule 0  . traMADol (ULTRAM) 50 MG tablet TAKE 1 TABLET BY MOUTH EVERY 6 HOURS AS NEEDED 30 tablet 5   No current facility-administered medications for this visit.     Allergies: No Known Allergies  Past Surgical History:    Procedure Laterality Date  . CHOLECYSTECTOMY N/A 12/05/2014   Procedure: LAPAROSCOPIC CHOLECYSTECTOMY;  Surgeon: Emelia Loron, MD;  Location: [facility] OR;  Service: General;  Laterality: N/A;  . SHOULDER ARTHROSCOPY WITH SUBACROMIAL DECOMPRESSION AND BICEP TENDON REPAIR Right 04/23/2013   Procedure: SHOULDER ARTHROSCOPY WITH SUBACROMIAL DECOMPRESSION AND BICEP TENDON TENOTOMY;  Surgeon: Mable Paris, MD;  Location: [facility];  Service: Orthopedics;  Laterality: Right;    Social History   Socioeconomic History  . Marital status: Single    Spouse name: Not on file  . Number of children: 0  . Years of education: 67  . Highest education level: Not on file  Social Needs  . Financial resource strain: Not on file  . Food insecurity - worry: Not on file  . Food insecurity - inability: Not on file  . Transportation needs - medical: Not on file  . Transportation needs - non-medical: Not on file  Occupational History  . Occupation: Research scientist (physical sciences)  Tobacco Use  . Smoking status: Former Smoker    Packs/day: 0.50    Years: 20.00    Pack years: 10.00    Types: Cigarettes    Start date: 04/30/2012    Last attempt to quit: 08/16/2012    Years since quitting: 5.0  .  Smokeless tobacco: Never Used  Substance and Sexual Activity  . Alcohol use: No    Alcohol/week: 0.0 oz  . Drug use: No  . Sexual activity: No    Comment: was smoking 1/2 pppd  Other Topics Concern  . Not on file  Social History Narrative   Lives at home alone.   Right-handed.   2 cups caffeine per day.    Family History  Problem Relation Age of Onset  . Stroke Mother   . Hypertension Mother   . Diabetes Mother   . Breast cancer Mother   . Healthy Father   . Heart disease Brother      ROS Review of Systems See HPI Constitution: No fevers or chills No malaise No diaphoresis Skin: No rash or itching Eyes: no blurry vision, no double vision GU: no dysuria or hematuria Neuro: no dizziness  or headaches * all others reviewed and negative   Objective: There were no vitals filed for this visit.  Physical Exam  Assessment and Plan There are no diagnoses linked to this encounter.   Jaz Laningham A Teague Goynes

## 2017-09-06 DIAGNOSIS — H43392 Other vitreous opacities, left eye: Secondary | ICD-10-CM | POA: Diagnosis not present

## 2017-09-06 DIAGNOSIS — H43812 Vitreous degeneration, left eye: Secondary | ICD-10-CM | POA: Diagnosis not present

## 2017-09-06 DIAGNOSIS — H33192 Other retinoschisis and retinal cysts, left eye: Secondary | ICD-10-CM | POA: Diagnosis not present

## 2017-09-15 ENCOUNTER — Encounter (INDEPENDENT_AMBULATORY_CARE_PROVIDER_SITE_OTHER): Payer: Self-pay

## 2017-09-15 ENCOUNTER — Encounter: Payer: Self-pay | Admitting: Neurology

## 2017-09-15 ENCOUNTER — Ambulatory Visit: Payer: Federal, State, Local not specified - PPO | Admitting: Neurology

## 2017-09-15 VITALS — BP 136/66 | HR 79 | Ht 60.0 in | Wt 154.0 lb

## 2017-09-15 DIAGNOSIS — R4701 Aphasia: Secondary | ICD-10-CM | POA: Diagnosis not present

## 2017-09-15 DIAGNOSIS — G4733 Obstructive sleep apnea (adult) (pediatric): Secondary | ICD-10-CM

## 2017-09-15 DIAGNOSIS — R479 Unspecified speech disturbances: Secondary | ICD-10-CM | POA: Diagnosis not present

## 2017-09-15 NOTE — Progress Notes (Signed)
Chief Complaint  Patient presents with  . Speech Abnormality    Last seen 12/22/15.  She is here for worsening speech.  She would like to discuss treatment options.   Chief Complaint  Patient presents with  . Speech Abnormality    Last seen 12/22/15.  She is here for worsening speech.  She would like to discuss treatment options.      PATIENT: Meghan GardenerLinda M Hickman DOB: [birth year]  Chief Complaint  Patient presents with  . Speech Abnormality    Last seen 12/22/15.  She is here for worsening speech.  She would like to discuss treatment options.     HISTORICAL  Meghan Hickman is a 67 years old right-handed female, alone at today's clinical visit, seen in refer by  her primary care physician Dr. Elgie CongoStephen Daub in July 08 2015 for evaluation of speech difficulty  She had a history of hypertension, hyperlipidemia, diabetes, depression, was recently started on Depakote 500 mg daily  She began to noticed gradual onset slurred speech since April 2016, initially attributed to her dental work, even after it was fixed, she continue have progressive slow talking, struggle with words, intermittent slurring, she denies chewing swallowing difficulty, she denies visual loss, she work as a Hydrographic surveyorpost office clerk, no limb muscle weakness,  She recently found to have enlarged thyroid, needle biopsy is planned, ultrasound of carotid artery was patent,  We have Reviewed CAT scan in October 2016, periventricular white matter disease, no acute lesions  UPDATE Jul 24 2015: She still struggle with words, mild slur,word finding difficulty, mild confusion. MRI of the brain reviewed, mild small vessel disease, more on left hemisphere  She has no acute lesions  UPDATE May 15th 2017:  She suffered sinus infection in early May, is treated with antibiotics, she had a temporary upper denture, complains of falling object sensation, mildly slurred speech, but overall is getting better, denies swallowing difficulty, she  denies limb muscle weakness  I have reviewed laboratory evaluation, elevated CBC in Dec 18 2014 16.2,  Personally reviewed MRI of the brain with patients again, supratentorium small vessel disease, especially at the deep white matter, she also complains of worsening depression, bilateral ear tinnitus,  UPDATE Sep 15 2017: Her speech is getting worse, the words would not come out, she has no trouble swallowing, no comprehensive problem. She has no double vision.   She has mild memory loss.   She has no family history of memory loss.  She has no weakness.  She still works at post office, Herbalistsorting mail. She complains of depression for many years, she lives alone.  She goes out with her friends occasionally.  She also complains of vibratory sensation in her head, snoring, frequent awakening, excessive daytime fatigue and sleepiness,  REVIEW OF SYSTEMS: Full 14 system review of systems performed and notable only for as above  No Known Allergies  HOME MEDICATIONS: Current Outpatient Medications  Medication Sig Dispense Refill  . albuterol (PROVENTIL HFA;VENTOLIN HFA) 108 (90 Base) MCG/ACT inhaler Inhale 2 puffs into the lungs every 6 (six) hours as needed for wheezing or shortness of breath. 1 Inhaler 0  . almotriptan (AXERT) 12.5 MG tablet Take 12.5 mg by mouth daily as needed for migraine.   4  . aspirin 81 MG tablet Take 81 mg by mouth daily.    [name] Kitchen. atorvastatin (LIPITOR) 20 MG tablet TAKE 1 TABLET BY MOUTH EVERY DAY AT 6 PM 90 tablet 3  . escitalopram (LEXAPRO) 20 MG tablet Take 1  tablet (20 mg total) by mouth daily. 90 tablet 1  . estradiol (ESTRACE) 0.5 MG tablet Take 0.5 mg by mouth daily.  0  . gabapentin (NEURONTIN) 100 MG capsule Take 100 mg by mouth 3 (three) times daily.    [name] Kitchen ipratropium (ATROVENT) 0.03 % nasal spray Place 2 sprays into both nostrils 2 (two) times daily. 30 mL 0  . losartan-hydrochlorothiazide (HYZAAR) 50-12.5 MG tablet Take 1 tablet by mouth daily. 90 tablet 1  .  metFORMIN (GLUCOPHAGE) 500 MG tablet TAKE 1 TABLET BY MOUTH EVERY DAY *SCHEDULE APPOINTMENT FOR FOLLOW UP* 90 tablet 0  . Multiple Vitamins-Minerals (MULTIVITAMIN & MINERAL PO) Take 1 tablet by mouth daily.    . ranitidine (ZANTAC) 150 MG tablet Take 1 tablet (150 mg total) by mouth daily as needed. 90 tablet 3  . temazepam (RESTORIL) 30 MG capsule TAKE 1 CAPSULE BY MOUTH AT BEDTIME AS NEEDED FOR SLEEP 30 capsule 0  . traMADol (ULTRAM) 50 MG tablet TAKE 1 TABLET BY MOUTH EVERY 6 HOURS AS NEEDED 30 tablet 5   No current facility-administered medications for this visit.     PAST MEDICAL HISTORY: Past Medical History:  Diagnosis Date  . Arthritis   . Depression   . Diabetes mellitus without complication (HCC)   . GERD (gastroesophageal reflux disease)   . Headache   . Heart murmur   . Hyperlipidemia   . Hypertension   . Speech problem   . Wears glasses     PAST SURGICAL HISTORY: Past Surgical History:  Procedure Laterality Date  . CHOLECYSTECTOMY N/A 12/05/2014   Procedure: LAPAROSCOPIC CHOLECYSTECTOMY;  Surgeon: Emelia Loron, MD;  Location: [facility] OR;  Service: General;  Laterality: N/A;  . SHOULDER ARTHROSCOPY WITH SUBACROMIAL DECOMPRESSION AND BICEP TENDON REPAIR Right 04/23/2013   Procedure: SHOULDER ARTHROSCOPY WITH SUBACROMIAL DECOMPRESSION AND BICEP TENDON TENOTOMY;  Surgeon: Mable Paris, MD;  Location: [facility];  Service: Orthopedics;  Laterality: Right;    FAMILY HISTORY: Family History  Problem Relation Age of Onset  . Stroke Mother   . Hypertension Mother   . Diabetes Mother   . Breast cancer Mother   . Healthy Father   . Heart disease Brother     SOCIAL HISTORY:  Social History   Socioeconomic History  . Marital status: Single    Spouse name: Not on file  . Number of children: 0  . Years of education: 33  . Highest education level: Not on file  Social Needs  . Financial resource strain: Not on file  . Food insecurity -  worry: Not on file  . Food insecurity - inability: Not on file  . Transportation needs - medical: Not on file  . Transportation needs - non-medical: Not on file  Occupational History  . Occupation: Research scientist (physical sciences)  Tobacco Use  . Smoking status: Former Smoker    Packs/day: 0.50    Years: 20.00    Pack years: 10.00    Types: Cigarettes    Start date: 04/30/2012    Last attempt to quit: 08/16/2012    Years since quitting: 5.0  . Smokeless tobacco: Never Used  Substance and Sexual Activity  . Alcohol use: No    Alcohol/week: 0.0 oz  . Drug use: No  . Sexual activity: No    Comment: was smoking 1/2 pppd  Other Topics Concern  . Not on file  Social History Narrative   Lives at home alone.   Right-handed.   2 cups caffeine per day.  PHYSICAL EXAM   Vitals:   09/15/17 1304  BP: 136/66  Pulse: 79  Weight: 154 lb (69.9 kg)  Height: 5' (1.524 m)    Not recorded      Body mass index is 30.08 kg/m.  PHYSICAL EXAMNIATION:  Gen: NAD, conversant, well nourised, obese, well groomed                     Cardiovascular: Regular rate rhythm, no peripheral edema, warm, nontender. Eyes: Conjunctivae clear without exudates or hemorrhage Neck: Supple, no carotid bruise. Pulmonary: Clear to auscultation bilaterally   NEUROLOGICAL EXAM:  MENTAL STATUS: Speech:  Mild hesitation in her speech, mild slurred, but variable degree, struggle with the world, normal comprehension, Cognition:     Orientation to time, place and person     Normal recent and remote memory     Normal Attention span and concentration     Normal Language, naming, repeating,spontaneous speech     Fund of knowledge   CRANIAL NERVES: CN II: Visual fields are full to confrontation. Fundoscopic exam is normal with sharp discs and no vascular changes. Pupils are round equal and briskly reactive to light. CN III, IV, VI: extraocular movement are normal. No ptosis. CN V: Facial sensation is intact to pinprick in  all 3 divisions bilaterally. Corneal responses are intact.  CN VII: Face is symmetric with normal eye closure and smile. CN VIII: Hearing is normal to rubbing fingers CN IX, X: Palate elevates symmetrically. Phonation is normal. CN XI: Head turning and shoulder shrug are intact CN XII: Tongue is midline with normal movements and no atrophy.  MOTOR: There is no pronator drift of out-stretched arms. Muscle bulk and tone are normal. Muscle strength is normal.  REFLEXES: Reflexes are 2+ and symmetric at the biceps, triceps, knees, and ankles. Plantar responses are flexor.  SENSORY: Intact to light touch, pinprick, position sense, and vibration sense are intact in fingers and toes.  COORDINATION: Rapid alternating movements and fine finger movements are intact. There is no dysmetria on finger-to-nose and heel-knee-shin.    GAIT/STANCE: Posture is normal. Gait is steady with normal steps, base, arm swing, and turning. Heel and toe walking are normal. Tandem gait is normal.  Romberg is absent.   DIAGNOSTIC DATA (LABS, IMAGING, TESTING) - I reviewed patient records, labs, notes, testing and imaging myself where available.   ASSESSMENT AND PLAN  Meghan Hickman is a 67 y.o. female   Expressive aphasia   Variable effort on exam  No evidence of muscle weakness, neuromuscular junctional disorders  Bilateral tinnitus  No structural lesions found  Small vessel disease  She has multiple vascular risk factors, hypertension, diabetes, hyperlipidemia,  I have advised her to take baby aspirin daily,    Symptoms of obstructive sleep apnea  Refer her to sleep study   Levert Feinstein, M.D. Ph.D.  [facility] [address], [address] [place], [place] [address] Ph: [phone] Fax: [phone]  CC: Collene Gobble, MD

## 2017-09-16 IMAGING — US US SOFT TISSUE HEAD/NECK
1 series · 14 of 25 positions shown · non-contrast
Comparison: None.

CLINICAL DATA: Right thyroid nodule

EXAM:
THYROID ULTRASOUND
TECHNIQUE: Ultrasound examination of the thyroid gland and adjacent soft
tissues was performed.

[Series 1: us soft tissue head/neck · 0.08mm/px · 14 of 65 slices shown]
[im 1/65]
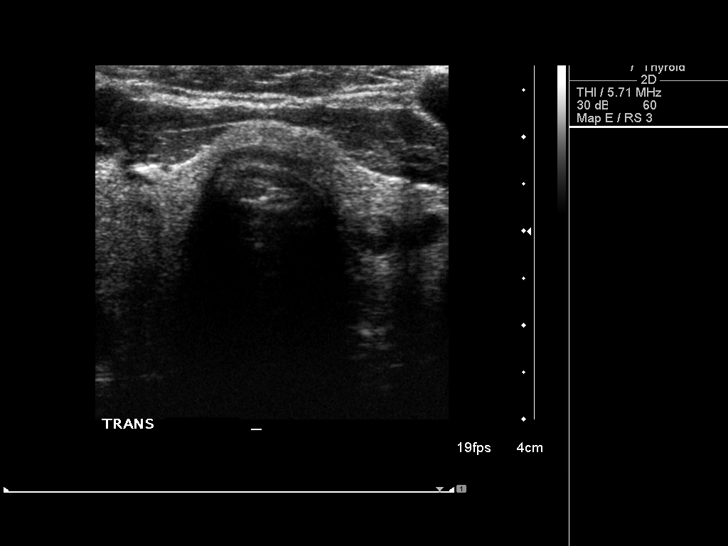
[im 6/65]
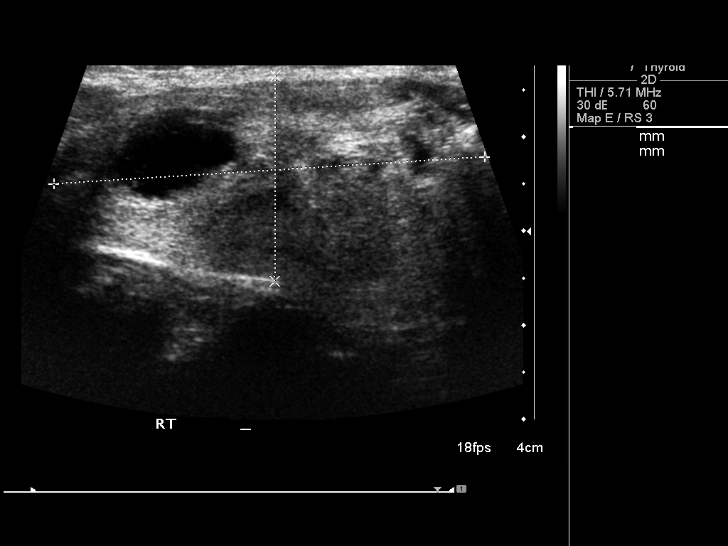
[im 11/65]
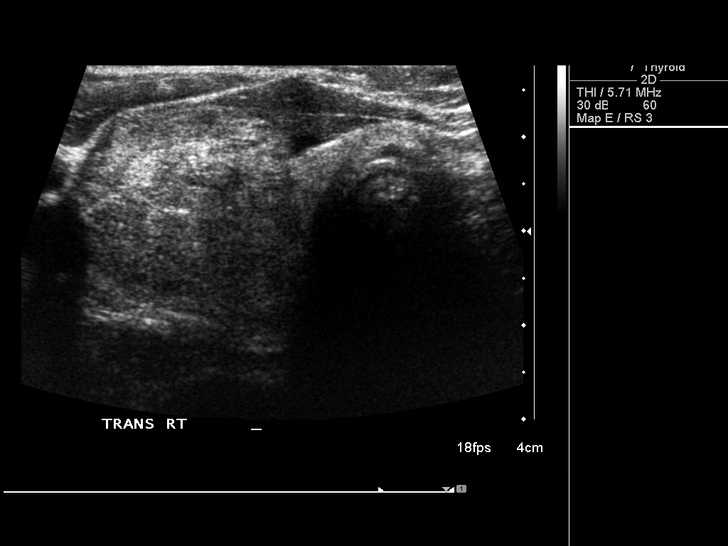
[im 17/65]
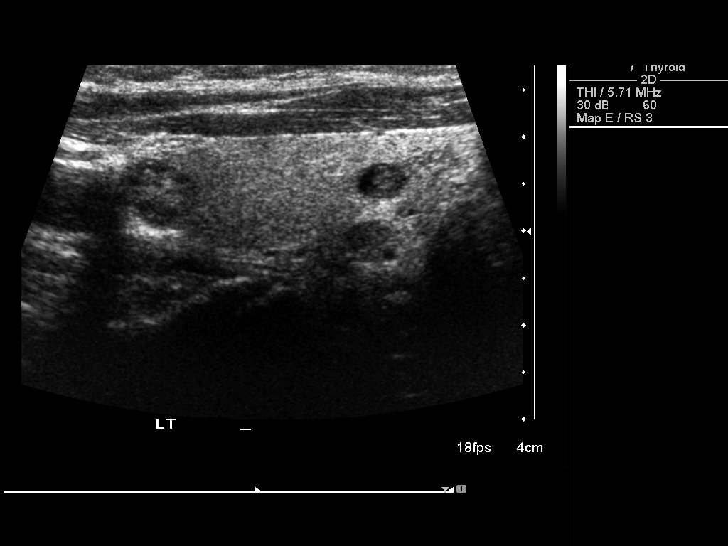
[im 22/65]
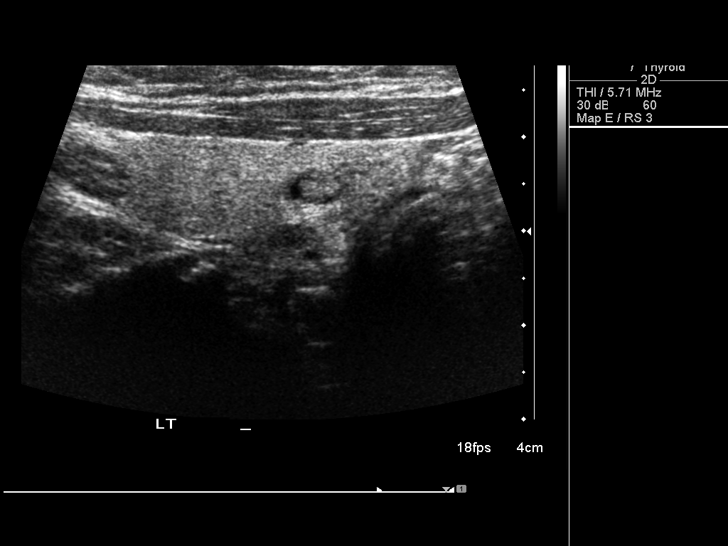
[im 25/65]
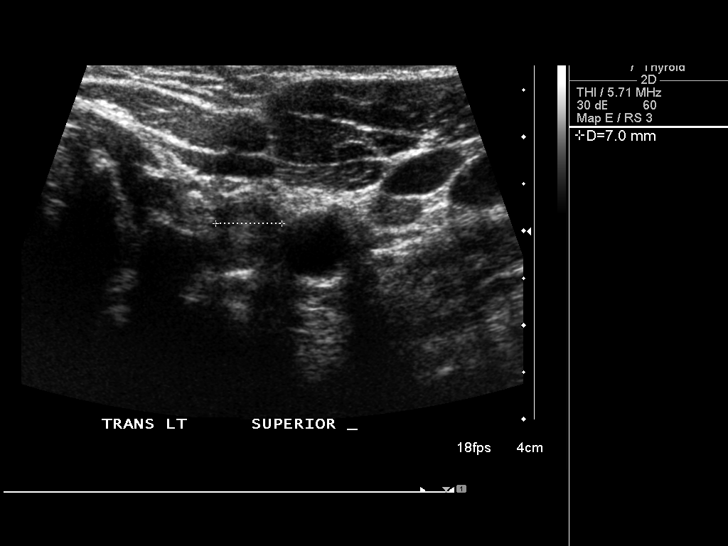
[im 30/65]
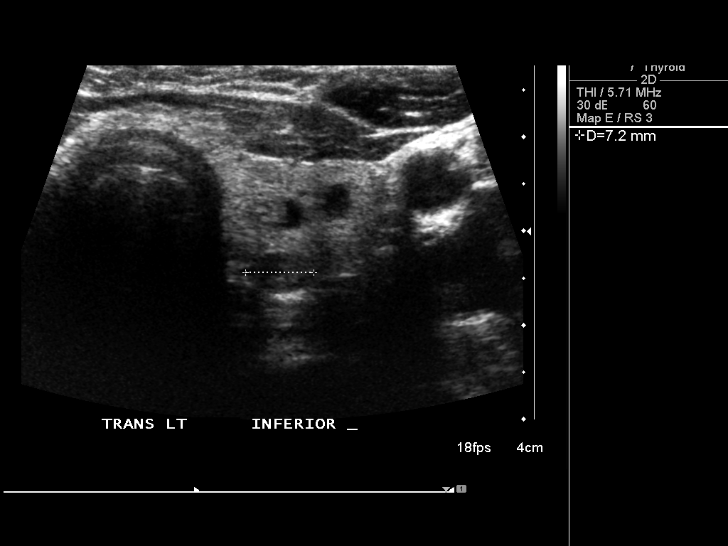
[im 35/65]
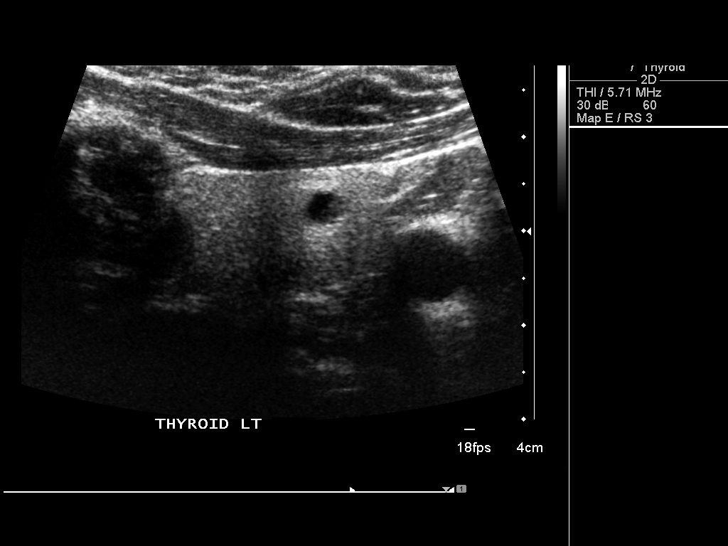
[im 41/65]
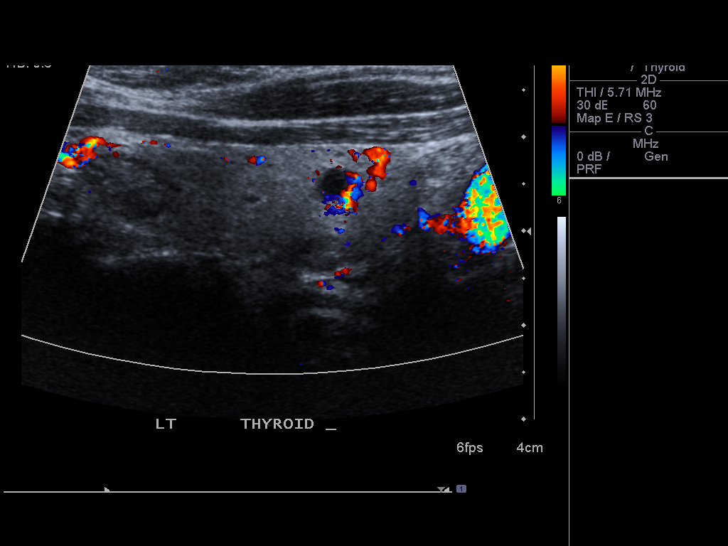
[im 43/65]
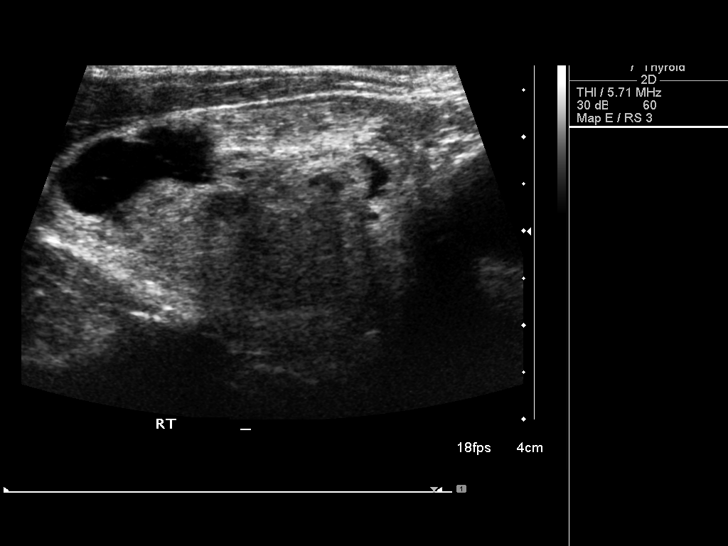
[im 49/65]
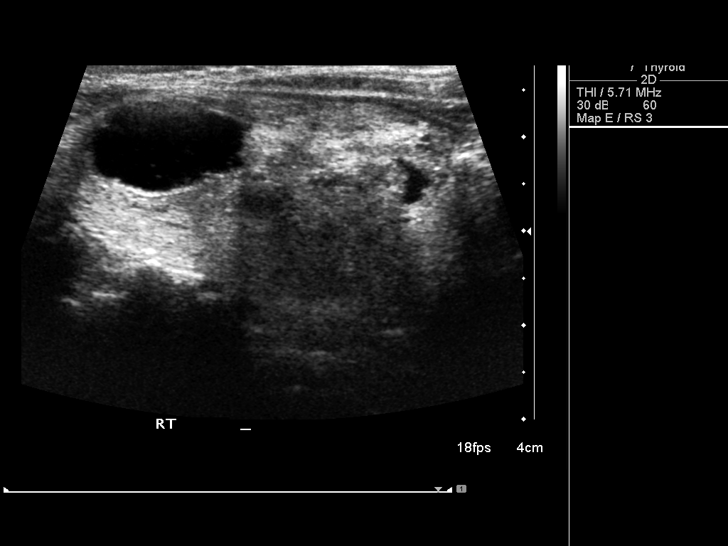
[im 54/65]
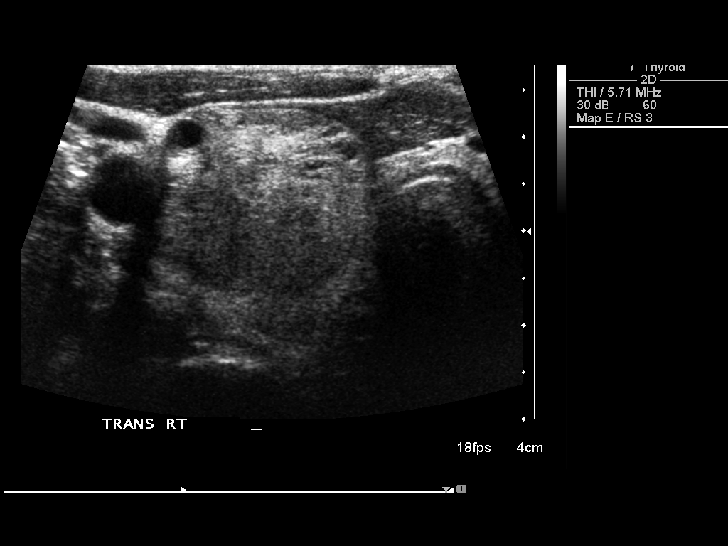
[im 59/65]
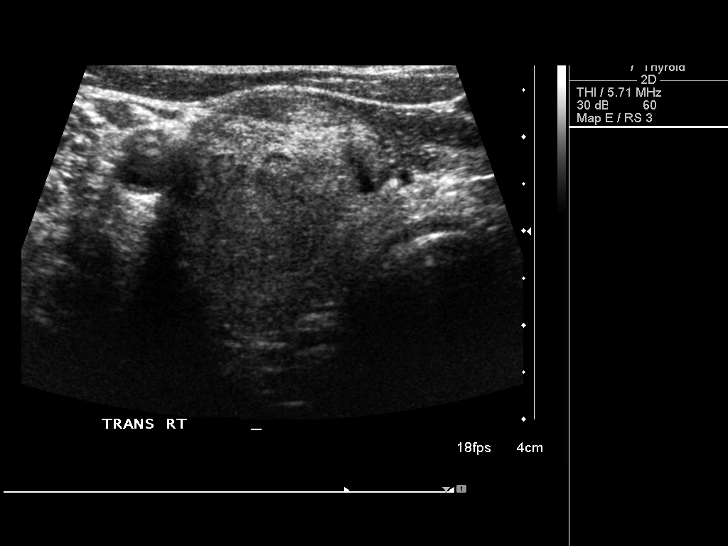
[im 65/65]
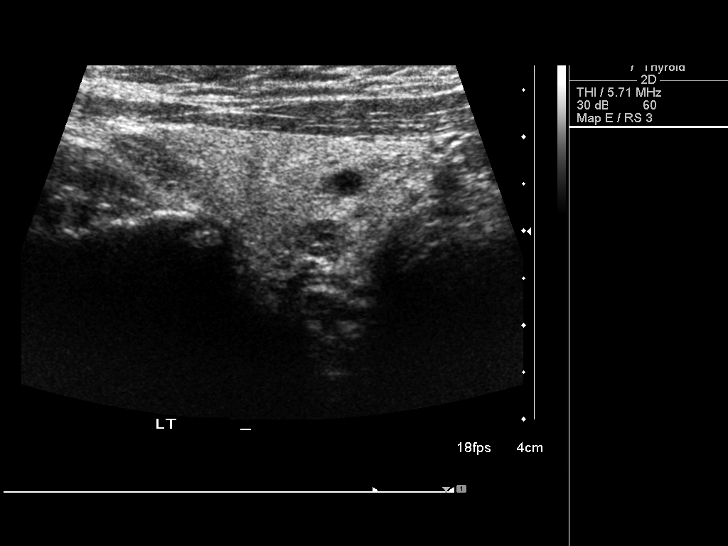

[14 of 25 positions shown; findings below may reference images not displayed]

FINDINGS: Right thyroid lobe

Measurements: 4.6 x 2.2 x 2.7 cm. Complex predominately solid right
lobe mass measures 3.8 x 2.0 x 2.2 cm.

Left thyroid lobe

Measurements: 3.9 x 1.4 x 2.0 cm. 11 mm upper pole heterogeneous and
hypoechoic nodule. Subcentimeter lower pole nodules.

Isthmus

Thickness: 3 mm.  No nodules visualized.

Lymphadenopathy

None visualized.
IMPRESSION: Bilateral nodules. Dominant right lobe mass measures 3.8 cm.
Findings meet consensus criteria for biopsy. Ultrasound-guided fine
needle aspiration should be considered, as per the consensus
statement: Management of Thyroid Nodules Detected at US: Society of
Radiologists in Ultrasound Consensus Conference Statement. Radiology

## 2017-09-19 ENCOUNTER — Telehealth: Payer: Self-pay | Admitting: Neurology

## 2017-09-19 MED ORDER — ALPRAZOLAM 1 MG PO TABS: ORAL_TABLET | ORAL | 0 refills | Status: DC

## 2017-09-19 NOTE — Telephone Encounter (Signed)
Per vo by Dr. Terrace ArabiaYan, provided Xanax per MRI protocol.  Pt has no known drug allergies.  Pt is aware that she must have driver with this medication.

## 2017-09-19 NOTE — Telephone Encounter (Signed)
Spoke to the patient and schedule [facility]. She informed me she is claustrophobic and will need something to calm her nerves.

## 2017-09-20 ENCOUNTER — Ambulatory Visit: Payer: Federal, State, Local not specified - PPO

## 2017-09-20 DIAGNOSIS — R479 Unspecified speech disturbances: Secondary | ICD-10-CM

## 2017-09-21 ENCOUNTER — Other Ambulatory Visit: Payer: Federal, State, Local not specified - PPO

## 2017-09-22 ENCOUNTER — Telehealth: Payer: Self-pay | Admitting: Neurology

## 2017-09-22 NOTE — Telephone Encounter (Signed)
Please call patient, MRI of the brain showed small vessel disease, no acute abnormality, no significant change compared to previous scan in November 2016.  IMPRESSION:   Abnormal MRI brain (without) demonstrating: 1. Mild scattered periventricular and 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  2. No acute findings. 3. No change from MRI on 07/25/15.

## 2017-09-22 NOTE — Telephone Encounter (Signed)
Spoke to patient she is aware of results

## 2017-10-05 ENCOUNTER — Ambulatory Visit: Payer: Federal, State, Local not specified - PPO | Admitting: Neurology

## 2017-10-05 DIAGNOSIS — R4701 Aphasia: Secondary | ICD-10-CM

## 2017-10-05 DIAGNOSIS — R479 Unspecified speech disturbances: Secondary | ICD-10-CM

## 2017-10-07 NOTE — Procedures (Signed)
   HISTORY: 67 year old female, has speech difficulty, TECHNIQUE:  16 channel EEG was performed based on standard 10-16 international system. One channel was dedicated to EKG, which has demonstrates normal sinus rhythm of 78 beats per minutes.  Upon awakening, the posterior background activity was well-developed, in alpha range, with amplitude of microvoltage, reactive to eye opening and closure.  There was no evidence of epileptiform discharge.  Photic stimulation was performed, which induced a symmetric photic driving.  Hyperventilation was not performed.  No sleep was achieved.  CONCLUSION: This is a  normal awake EEG.  There is no electrodiagnostic evidence of epileptiform discharge.  Meghan Hickman, M.D. Ph.D.  [facility] [address] [place], [place] [address] Phone: [phone] Fax:      [phone]

## 2017-10-10 ENCOUNTER — Encounter: Payer: Self-pay | Admitting: Neurology

## 2017-10-10 ENCOUNTER — Ambulatory Visit (INDEPENDENT_AMBULATORY_CARE_PROVIDER_SITE_OTHER): Payer: Federal, State, Local not specified - PPO | Admitting: Neurology

## 2017-10-10 VITALS — BP 110/78 | HR 88 | Ht 60.0 in | Wt 155.0 lb

## 2017-10-10 DIAGNOSIS — R0683 Snoring: Secondary | ICD-10-CM

## 2017-10-10 DIAGNOSIS — R0681 Apnea, not elsewhere classified: Secondary | ICD-10-CM

## 2017-10-10 DIAGNOSIS — R351 Nocturia: Secondary | ICD-10-CM | POA: Diagnosis not present

## 2017-10-10 DIAGNOSIS — E669 Obesity, unspecified: Secondary | ICD-10-CM

## 2017-10-10 DIAGNOSIS — G4719 Other hypersomnia: Secondary | ICD-10-CM | POA: Diagnosis not present

## 2017-10-10 DIAGNOSIS — G4726 Circadian rhythm sleep disorder, shift work type: Secondary | ICD-10-CM

## 2017-10-10 NOTE — Patient Instructions (Signed)

## 2017-10-10 NOTE — Progress Notes (Signed)
Subjective:    Patient ID: Meghan Hickman is a 67 y.o. female.  HPI     Huston Foley, MD, PhD [facility] [address], [address] [address] [place], [place] [address]  Dear [name],   I saw your patient, Meghan Hickman upon your kind request, in my clinic today for initial consultation of her sleep disorder, in particular, concern for obstructive sleep apnea. The patient is unaccompanied today. As you know, [name] is a 67 year old right-handed woman with an underlying medical history of hypertension, hyperlipidemia, diabetes, history of slurring of speech, reflux disease, arthritis, and borderline obesity, who reports snoring and excessive daytime somnolence, as well as witnessed apneas per friend, who also reports that her snoring is loud. I reviewed your office note from 09/15/2017. She reports having had a sleep study many years ago which showed borderline findings. Her Epworth sleepiness score is 7 out of 24 today, fatigue score is 48 out of 63. She is single and lives alone, she works for the [employer]. She has no children. She quit smoking in 2001, drinks alcohol very rarely, maybe twice a year or so, drinks caffeine, about 2 servings per day, coffee and soda. She works nights, typically from 7 PM to 3:30 AM. Bedtime is around 8 AM and rise time around 5 PM. She is not aware of any family history of OSA. She has nocturia about 3 times per average sleep schedule. She would prefer a daytime sleep study. She is off on Mondays and Tuesdays but tries to maintain her sleep schedule. She had a recent brain MRI on 09/20/2017 which showed no acute findings.  Her Past Medical History Is Significant For: Past Medical History:  Diagnosis Date  . Arthritis   . Depression   . Diabetes mellitus without complication (HCC)   . GERD (gastroesophageal reflux disease)   . Headache   . Heart murmur   . Hyperlipidemia   . Hypertension   . Speech problem   . Wears glasses      Her Past Surgical History Is Significant For: Past Surgical History:  Procedure Laterality Date  . CHOLECYSTECTOMY N/A 12/05/2014   Procedure: LAPAROSCOPIC CHOLECYSTECTOMY;  Surgeon: Emelia Loron, MD;  Location: [facility] OR;  Service: General;  Laterality: N/A;  . SHOULDER ARTHROSCOPY WITH SUBACROMIAL DECOMPRESSION AND BICEP TENDON REPAIR Right 04/23/2013   Procedure: SHOULDER ARTHROSCOPY WITH SUBACROMIAL DECOMPRESSION AND BICEP TENDON TENOTOMY;  Surgeon: Mable Paris, MD;  Location: [facility];  Service: Orthopedics;  Laterality: Right;    Her Family History Is Significant For: Family History  Problem Relation Age of Onset  . Stroke Mother   . Hypertension Mother   . Diabetes Mother   . Breast cancer Mother   . Healthy Father   . Heart disease Brother     Her Social History Is Significant For: Social History   Socioeconomic History  . Marital status: Single    Spouse name: None  . Number of children: 0  . Years of education: 93  . Highest education level: None  Social Needs  . Financial resource strain: None  . Food insecurity - worry: None  . Food insecurity - inability: None  . Transportation needs - medical: None  . Transportation needs - non-medical: None  Occupational History  . Occupation: Research scientist (physical sciences)  Tobacco Use  . Smoking status: Former Smoker    Packs/day: 0.50    Years: 20.00    Pack years: 10.00    Types: Cigarettes  Start date: 04/30/2012    Last attempt to quit: 08/16/2012    Years since quitting: 5.1  . Smokeless tobacco: Never Used  Substance and Sexual Activity  . Alcohol use: No    Alcohol/week: 0.0 oz  . Drug use: No  . Sexual activity: No    Comment: was smoking 1/2 pppd  Other Topics Concern  . None  Social History Narrative   Lives at home alone.   Right-handed.   2 cups caffeine per day.    Her Allergies Are:  No Known Allergies:   Her Current Medications Are:  Outpatient Encounter Medications as of  10/10/2017  Medication Sig  . albuterol (PROVENTIL HFA;VENTOLIN HFA) 108 (90 Base) MCG/ACT inhaler Inhale 2 puffs into the lungs every 6 (six) hours as needed for wheezing or shortness of breath.  [name] Kitchen. almotriptan (AXERT) 12.5 MG tablet Take 12.5 mg by mouth daily as needed for migraine.   . ALPRAZolam (XANAX) 1 MG tablet Take 1-2 tablets thirty minutes prior to MRI.  May repeat with 1 additional tablet prior to entering scanner, if needed.  Must have driver.  [name] Kitchen. aspirin 81 MG tablet Take 81 mg by mouth daily.  [name] Kitchen. atorvastatin (LIPITOR) 20 MG tablet TAKE 1 TABLET BY MOUTH EVERY DAY AT 6 PM  . escitalopram (LEXAPRO) 20 MG tablet Take 1 tablet (20 mg total) by mouth daily.  [name] Kitchen. estradiol (ESTRACE) 0.5 MG tablet Take 0.5 mg by mouth daily.  [name] Kitchen. gabapentin (NEURONTIN) 100 MG capsule Take 100 mg by mouth 3 (three) times daily.  [name] Kitchen. ipratropium (ATROVENT) 0.03 % nasal spray Place 2 sprays into both nostrils 2 (two) times daily.  [name] Kitchen. losartan-hydrochlorothiazide (HYZAAR) 50-12.5 MG tablet Take 1 tablet by mouth daily.  . metFORMIN (GLUCOPHAGE) 500 MG tablet TAKE 1 TABLET BY MOUTH EVERY DAY *SCHEDULE APPOINTMENT FOR FOLLOW UP*  . Multiple Vitamins-Minerals (MULTIVITAMIN & MINERAL PO) Take 1 tablet by mouth daily.  . ranitidine (ZANTAC) 150 MG tablet Take 1 tablet (150 mg total) by mouth daily as needed.  . temazepam (RESTORIL) 30 MG capsule TAKE 1 CAPSULE BY MOUTH AT BEDTIME AS NEEDED FOR SLEEP  . traMADol (ULTRAM) 50 MG tablet TAKE 1 TABLET BY MOUTH EVERY 6 HOURS AS NEEDED   No facility-administered encounter medications on file as of 10/10/2017.   :  Review of Systems:  Out of a complete 14 point review of systems, all are reviewed and negative with the exception of these symptoms as listed below:  Review of Systems  Neurological:       Pt presents today to discuss her sleep. Pt has had a sleep study about 20 years ago which showed "marginal" sleep apnea. Pt does endorse snoring.  Epworth Sleepiness Scale 0=  would never doze 1= slight chance of dozing 2= moderate chance of dozing 3= high chance of dozing  Sitting and reading: 0 Watching TV: 2 Sitting inactive in a public place (ex. Theater or meeting): 1 As a passenger in a car for an hour without a break: 1 Lying down to rest in the afternoon: 1 Sitting and talking to someone: 0 Sitting quietly after lunch (no alcohol): 2 In a car, while stopped in traffic: 0 Total: 7     Objective:  Neurological Exam  Physical Exam Physical Examination:   Vitals:   10/10/17 1556  BP: 110/78  Pulse: 88    General Examination: The patient is a very pleasant 67 y.o. female in no acute distress. She appears well-developed and well-nourished and well  groomed.   HEENT: Normocephalic, atraumatic, pupils are equal, round and reactive to light and accommodation. Extraocular tracking is good without limitation to gaze excursion or nystagmus noted. Normal smooth pursuit is noted. Hearing is grossly intact. Face is symmetric with normal facial animation and normal facial sensation. Speech is slow and with pauses at times, deliberate sounding, no clear pattern, no scanning speech. There is no hypophonia. There is no lip, neck/head, jaw or voice tremor. Neck is supple with full range of passive and active motion. There are no carotid bruits on auscultation. Oropharynx exam reveals: mild mouth dryness, adequate dental hygiene and moderate airway crowding, due to smaller airway entry, redundant soft palate, tonsils in place which are on the small side. Mallampati is class II. Neck circumference is 15 inches. She has a minimal to absent overbite.  Chest: Clear to auscultation without wheezing, rhonchi or crackles noted.  Heart: S1+S2+0, regular and normal without murmurs, rubs or gallops noted.   Abdomen: Soft, non-tender and non-distended with normal bowel sounds appreciated on auscultation.  Extremities: There is no pitting edema in the distal lower extremities  bilaterally.   Skin: Warm and dry without trophic changes noted.  Musculoskeletal: exam reveals no obvious joint deformities, tenderness or joint swelling or erythema.   Neurologically:  Mental status: The patient is awake, alert and oriented in all 4 spheres. Her immediate and remote memory, attention, language skills and fund of knowledge are appropriate. There is no evidence of aphasia, agnosia, apraxia or anomia. Speech as above. Thought process is linear. Mood is normal and affect is normal.  Cranial nerves II - XII are as described above under HEENT exam. In addition: shoulder shrug is normal with equal shoulder height noted. Motor exam: Normal bulk, strength and tone is noted. There is no drift, tremor or rebound. Romberg is negative. Reflexes are 1+ throughout. Fine motor skills and coordination: grossly intact.  Cerebellar testing: No dysmetria or intention tremor. There is no truncal or gait ataxia.  Sensory exam: intact to light touch in the upper and lower extremities.  Gait, station and balance: She stands easily. No veering to one side is noted. No leaning to one side is noted. Posture is age-appropriate and stance is narrow based. Gait shows normal stride length and normal pace. No problems turning are noted. Tandem walk is unremarkable.   Assessment and Plan:   In summary, KACY HEGNA is a very pleasant 67 y.o.-year old female with an underlying medical history of hypertension, hyperlipidemia, diabetes, history of slurring of speech, reflux disease, arthritis, and borderline obesity, whose history and physical exam are concerning for obstructive sleep apnea (OSA). I had a long chat with the patient about my findings and the diagnosis of OSA, its prognosis and treatment options. We talked about medical treatments, surgical interventions and non-pharmacological approaches. I explained in particular the risks and ramifications of untreated moderate to severe OSA, especially with  respect to developing cardiovascular disease down the Road, including congestive heart failure, difficult to treat hypertension, cardiac arrhythmias, or stroke. Even type 2 diabetes has, in part, been linked to untreated OSA. Symptoms of untreated OSA include daytime sleepiness, memory problems, mood irritability and mood disorder such as depression and anxiety, lack of energy, as well as recurrent headaches, especially morning headaches. We talked about trying to maintain a healthy lifestyle in general, as well as the importance of weight control. I encouraged the patient to eat healthy, exercise daily and keep well hydrated, to keep a scheduled bedtime and  wake time routine, to not skip any meals and eat healthy snacks in between meals. I advised the patient not to drive when feeling sleepy. I recommended the following at this time: sleep study with potential positive airway pressure titration. (We will score hypopneas at 3%).   I explained the sleep test procedure to the patient and also outlined possible surgical and non-surgical treatment options of OSA, including the use of a custom-made dental device (which would require a referral to a specialist dentist or oral surgeon), upper airway surgical options, such as pillar implants, radiofrequency surgery, tongue base surgery, and UPPP (which would involve a referral to an ENT surgeon). Rarely, jaw surgery such as mandibular advancement may be considered.  I also explained the CPAP treatment option to the patient, who indicated that she would be willing to try CPAP if the need arises. I explained the importance of being compliant with PAP treatment, not only for insurance purposes but primarily to improve Her symptoms, and for the patient's long term health benefit, including to reduce Her cardiovascular risks. I answered all her questions today and the patient was in agreement. I will likely see her back after the sleep study is completed and encouraged her  to call with any interim questions, concerns, problems or updates.   Thank you very much for allowing me to participate in the care of this nice patient. If I can be of any further assistance to you please do not hesitate to talk to me.  Sincerely,   Huston Foley, MD, PhD

## 2017-10-12 ENCOUNTER — Other Ambulatory Visit: Payer: Self-pay

## 2017-10-12 ENCOUNTER — Ambulatory Visit: Payer: Federal, State, Local not specified - PPO | Attending: Neurology

## 2017-10-12 DIAGNOSIS — R482 Apraxia: Secondary | ICD-10-CM | POA: Diagnosis not present

## 2017-10-12 NOTE — Patient Instructions (Addendum)
Speech Exercises  Repeat at least 2 times, at least twice a day  Call the cat "Buttercup" A calendar of [place] Four floors to cover Yellow oil ointment Fellow lovers of felines Catastrophe in [place] Plump plumbers' plums The church's chimes chimed Telling time 'til eleven Five valve levers Keep the gate closed Go see that guy Fat cows give milk Automatic DataMinnesota Golden Gophers Fat frogs flip freely TXU Corpuck Tommy into bed Get that game to American Standard Companiesreg Thick thistles stick together Cinnamon aluminum linoleum Black bugs blood Lovely lemon linament Red leather, yellow leather  Big grocery buggy    Purple baby carriage [facility] Proper copper coffee pot Ripe purple cabbage Three free throws Owens-Illinoisim Tebow tackled  [employer] Eagles [place] Dave dipped the dessert  Duke Navistar International CorporationBlue Devils Buckle that Health Netbracket    The gospel of BJ'sMark Shirts shrink, shells shouldn't MetalineSan Francisco 49ers Take the tackle box File the flash message Give me five flapjacks Fundamental relatives Dye the pets purple Talking Malawiturkey time after time Dark chocolate chunks Political landscape of the kingdom Actuarylectrical engineering genius We played yo-yos yesterday     About Apraxia of Speech  To speak, messages must go from your brain to your mouth. These messages tell the muscles how and when to move to make sounds. When you have apraxia of speech, the messages do not get through correctly, due to brain damage. You might not be able to move your lips or tongue the right way to say sounds. Sometimes, you might not be able to speak at all.  Apraxia of speech is sometimes called acquired apraxia of speech, verbal apraxia, or dyspraxia. It is a motor speech disorder. You can also have apraxia in other parts of your body, like in your arms or legs. This is called limb apraxia.  How severe your apraxia is depends on what type of brain damage you have. Apraxia can happen at the same time as other  speech or language problems. You may have muscle weakness in your mouth. This is called dysarthria. You could also have trouble understanding what others say or telling others what you are thinking. This is called aphasia.

## 2017-10-12 NOTE — Therapy (Signed)
[facility] [address] [place], [place], [address] Phone: [phone]   Fax:  [phone]  Speech Language Pathology Evaluation  Patient Details  Name: Meghan GardenerLinda M Hickman MRN: [id number] Date of Birth: May 22, 1951 Referring Provider: Levert FeinsteinYan, Yijun, MD   Encounter Date: 10/12/2017  End of Session - 10/12/17 1638    Visit Number  1    Number of Visits  17    Date for SLP Re-Evaluation  12/30/17    SLP Start Time  1533    SLP Stop Time   1618    SLP Time Calculation (min)  45 min    Activity Tolerance  Patient tolerated treatment well       Past Medical History:  Diagnosis Date  . Arthritis   . Depression   . Diabetes mellitus without complication (HCC)   . GERD (gastroesophageal reflux disease)   . Headache   . Heart murmur   . Hyperlipidemia   . Hypertension   . Speech problem   . Wears glasses     Past Surgical History:  Procedure Laterality Date  . CHOLECYSTECTOMY N/A 12/05/2014   Procedure: LAPAROSCOPIC CHOLECYSTECTOMY;  Surgeon: Emelia LoronMatthew Wakefield, MD;  [facility];  Service: General;  Laterality: N/A;  . SHOULDER ARTHROSCOPY WITH SUBACROMIAL DECOMPRESSION AND BICEP TENDON REPAIR Right 04/23/2013   Procedure: SHOULDER ARTHROSCOPY WITH SUBACROMIAL DECOMPRESSION AND BICEP TENDON TENOTOMY;  Surgeon: Mable ParisJustin William Chandler, MD;  Location:  SURGERY CENTER;  Service: Orthopedics;  Laterality: Right;    There were no vitals filed for this visit.  Subjective Assessment - 10/12/17 1533    Subjective  Pt with difficulty speaking in last 2 years but worse in last 6 months.    Currently in Pain?  No/denies         SLP Evaluation [facility] - 10/12/17 1547      SLP Visit Information   SLP Received On  10/12/17    Referring Provider  Levert FeinsteinYan, Yijun, MD    Onset Date  Approx 1 1/2-2 years ago    Medical Diagnosis  Speech difficulty      General Information   HPI  Pt with difficulty in speaking approx 2  years ago, thinking "it was going to heal itself". Now, pt reports seeing referring MD again in the fall, inquiring about speech therapy due to worsening symptoms. 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."      Prior Functional Status   Cognitive/Linguistic Baseline  Within functional limits      Cognition   Overall Cognitive Status  Within Functional Limits for tasks assessed      Auditory Comprehension   Overall Auditory Comprehension  Appears within functional limits for tasks assessed      Verbal Expression   Overall Verbal Expression  Appears within functional limits for tasks assessed      Oral Motor/Sensory Function   Overall Oral Motor/Sensory Function  Impaired    Labial Coordination  Reduced Alternating motion: /u/-/i/    Lingual Coordination  Reduced "buttercup"x3    Velum  Within Functional Limits      Motor Speech   Overall Motor Speech  Impaired    Articulation  Impaired    Level of Impairment  Word > 2-syllable    Intelligibility  Intelligible but consistently labored speech    Motor Planning  Impaired    Level of Impairment  Word  Motor Speech Errors  Groping for words;Aware;Inconsistent    Effective Techniques  Slow rate pt endorses that slower rate incr speech fluidity      Specific speech errors: "swissly"/swiftly "produnced"/pronounced "kissuhly"/skillfully "Artillee-erly"/artillery "refrigerderation"/refrigeration "[place]"/[place] "[place]"/[place]                SLP Education - 10/12/17 1637    Education provided  Yes    Education Details  Apraxia of speech, HEP, rationale for HEP, unsure of prognosis of ST    Person(s) Educated  Patient    Methods  Explanation;Handout    Comprehension  Verbalized understanding;Returned demonstration       SLP Short Term Goals -  10/12/17 1644      SLP SHORT TERM GOAL #1   Title  pt will demo HEP for apraxia with 70% slowed rate over two sessions    Time  4    Period  Weeks    Status  New      SLP SHORT TERM GOAL #2   Title  pt will demo HEP for apraxia with functional articulation 90% of the time over two sessions    Time  4    Period  Weeks    Status  New      SLP SHORT TERM GOAL #3   Title  pt will completed structured speech tasks at the sentence level with reduced speech rate 80% of the time over three sessions    Time  4    Period  Weeks    Status  New       SLP Long Term Goals - 10/12/17 1646      SLP LONG TERM GOAL #1   Title  pt will demo HEP for apraxia with 85% slowed rate over three sessions    Time  8    Period  Weeks    Status  New      SLP LONG TERM GOAL #2   Title  pt will demo HEP for apraxia with functional articulation 90% of the time over five sessions    Time  8    Period  Weeks    Status  New      SLP LONG TERM GOAL #3   Title  pt will demo 10 mintues functional simple to mod complex conversation with modified independence (using compensations) over three sessions    Time  8    Period  Weeks    Status  New       Plan - 10/12/17 1638    Clinical Impression Statement  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 February 2019 MRI unchanged from two years ago, however pt reports symptoms have worsened since August when she reports she saw her MD to inquire about ST. Pt was successful in decreasing (but not eliminating) the frequency of hesitation/pausing in her speech by reducing her speech rate today during the evaluation. Pt would benefit from skilled ST focusing on improving speech fluidity by structured speech tasks, and also by habituating reduced speech rate.    Speech Therapy Frequency  2x / week    Duration  -- 8 weeks or 17 sessions     Treatment/Interventions  Cueing hierarchy;SLP instruction and feedback;Other (comment);Compensatory techniques;Functional tasks;Patient/family education;Internal/external aids;Multimodal communcation approach HEP    Potential to Achieve Goals  Fair    Potential Considerations  Previous level of function  SLP Home Exercise Plan  provided today    Consulted and Agree with Plan of Care  Patient       Patient will benefit from skilled therapeutic intervention in order to improve the following deficits and impairments:   Verbal apraxia    Problem List Patient Active Problem List   Diagnosis Date Noted  . Expressive aphasia 09/15/2017  . Migraine 10/13/2016  . Insomnia 10/13/2016  . Essential hypertension 10/13/2016  . Moderate episode of recurrent major depressive disorder (HCC) 05/12/2016  . Small vessel disease, cerebrovascular 12/22/2015  . Slurred speech 07/24/2015  . Abnormal CT of brain 07/08/2015  . Speech abnormality 06/20/2015  . Thyroid nodule 06/20/2015  . S/P laparoscopic cholecystectomy 12/05/2014  . Heart murmur 02/11/2013  . Type 2 diabetes mellitus, controlled (HCC) 02/09/2013  . Other and unspecified hyperlipidemia 02/09/2013    [facility] ,MS, CCC-SLP  10/12/2017, 4:48 PM  [facility] [address] [place], [place], [address] Phone: [phone]   Fax:  [phone]  Name: Meghan Hickman MRN: [id number] Date of Birth: [birth year]

## 2017-10-17 ENCOUNTER — Institutional Professional Consult (permissible substitution): Payer: Federal, State, Local not specified - PPO | Admitting: Neurology

## 2017-10-19 ENCOUNTER — Ambulatory Visit: Payer: Federal, State, Local not specified - PPO

## 2017-10-19 DIAGNOSIS — R482 Apraxia: Secondary | ICD-10-CM | POA: Diagnosis not present

## 2017-10-19 NOTE — Therapy (Signed)
[facility] [address] [place], [place], [address] Phone: [phone]   Fax:  [phone]  Speech Language Pathology Treatment  Patient Details  Name: Meghan GardenerLinda M Hickman MRN: [id number] Date of Birth: 09-Sep-1950 Referring Provider: Levert FeinsteinYan, Yijun, MD   Encounter Date: 10/19/2017  End of Session - 10/19/17 1140    Visit Number  2    Number of Visits  17    Date for SLP Re-Evaluation  12/30/17    SLP Start Time  0856    SLP Stop Time   0930    SLP Time Calculation (min)  34 min    Activity Tolerance  Patient tolerated treatment well       Past Medical History:  Diagnosis Date  . Arthritis   . Depression   . Diabetes mellitus without complication (HCC)   . GERD (gastroesophageal reflux disease)   . Headache   . Heart murmur   . Hyperlipidemia   . Hypertension   . Speech problem   . Wears glasses     Past Surgical History:  Procedure Laterality Date  . CHOLECYSTECTOMY N/A 12/05/2014   Procedure: LAPAROSCOPIC CHOLECYSTECTOMY;  Surgeon: Emelia LoronMatthew Wakefield, MD;  [facility];  Service: General;  Laterality: N/A;  . SHOULDER ARTHROSCOPY WITH SUBACROMIAL DECOMPRESSION AND BICEP TENDON REPAIR Right 04/23/2013   Procedure: SHOULDER ARTHROSCOPY WITH SUBACROMIAL DECOMPRESSION AND BICEP TENDON TENOTOMY;  Surgeon: Mable ParisJustin William Chandler, MD;  Location: [facility];  Service: Orthopedics;  Laterality: Right;    There were no vitals filed for this visit.  Subjective Assessment - 10/19/17 0857    Subjective  Minor hesitation in explaining how the homework (HEP) went. Pt 10 minutes late to session.    Currently in Pain?  No/denies            ADULT SLP TREATMENT - 10/19/17 0859      General Information   Behavior/Cognition  Alert;Cooperative;Pleasant mood      Cognitive-Linquistic Treatment   Treatment focused on  Apraxia    Skilled Treatment  Pt states she went online and checked out some  information about apraxia of speech. She and SLP discussed question pt had about primary progressive aphasia (PPA). SLP told pt that she presents not unlike a pt with PPA however her brain imaging does not follow the pattern of PPA. SLP had pt practice random HEP stimuli - she maintained slower rate 100% of the time, with functional articulation 95-100%. SLP then had pt read a mod complex paragraphs in a story in order to practice unfamiliar text/material. Pt speech fluidity improved as the selection progressed. SLP had pt self-rate her ability to reduce rate to improve her awareness       Assessment / Recommendations / Plan   Plan  Continue with current plan of care      Progression Toward Goals   Progression toward goals  Progressing toward goals       SLP Education - 10/19/17 1139    Education provided  Yes    Education Details  compensations for apraxia (reduced speech rate - feel every sound, elongate vowels, etc), homework, PPA    Person(s) Educated  Patient    Methods  Explanation;Handout    Comprehension  Verbalized understanding       SLP Short Term Goals - 10/19/17 1141      SLP SHORT TERM GOAL #1   Title  pt will demo HEP for apraxia with 70% slowed rate over two sessions  Time  4    Period  Weeks    Status  On-going      SLP SHORT TERM GOAL #2   Title  pt will demo HEP for apraxia with functional articulation 90% of the time over two sessions    Time  4    Period  Weeks    Status  On-going      SLP SHORT TERM GOAL #3   Title  pt will completed structured speech tasks at the sentence level with reduced speech rate 80% of the time over three sessions    Time  4    Period  Weeks    Status  On-going       SLP Long Term Goals - 10/19/17 1141      SLP LONG TERM GOAL #1   Title  pt will demo HEP for apraxia with 85% slowed rate over three sessions    Time  8    Period  Weeks    Status  On-going      SLP LONG TERM GOAL #2   Title  pt will demo HEP for apraxia  with functional articulation 90% of the time over five sessions    Time  8    Period  Weeks    Status  On-going      SLP LONG TERM GOAL #3   Title  pt will demo 10 mintues functional simple to mod complex conversation with modified independence (using compensations) over three sessions    Time  8    Period  Weeks    Status  On-going       Plan - 10/19/17 1141    Clinical Impression Statement  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 February 2019 MRI unchanged from two years ago, however pt reports symptoms have worsened since August when she reports she saw her MD to inquire about ST. Pt was successful in decreasing (but not eliminating) the frequency of hesitation/pausing in her speech by reducing her speech rate today during the evaluation. Pt would benefit from skilled ST focusing on improving speech fluidity by structured speech tasks, and also by habituating reduced speech rate.    Speech Therapy Frequency  2x / week    Duration  -- 8 weeks / 17 sessions    Treatment/Interventions  Cueing hierarchy;SLP instruction and feedback;Other (comment);Compensatory techniques;Functional tasks;Patient/family education;Internal/external aids;Multimodal communcation approach    Potential to Achieve Goals  Fair    Potential Considerations  Previous level of function       Patient will benefit from skilled therapeutic intervention in order to improve the following deficits and impairments:   Verbal apraxia    Problem List Patient Active Problem List   Diagnosis Date Noted  . Expressive aphasia 09/15/2017  . Migraine 10/13/2016  . Insomnia 10/13/2016  . Essential hypertension 10/13/2016  . Moderate episode of recurrent major depressive disorder (HCC) 05/12/2016  . Small vessel disease, cerebrovascular 12/22/2015  . Slurred speech 07/24/2015  . Abnormal CT of brain  07/08/2015  . Speech abnormality 06/20/2015  . Thyroid nodule 06/20/2015  . S/P laparoscopic cholecystectomy 12/05/2014  . Heart murmur 02/11/2013  . Type 2 diabetes mellitus, controlled (HCC) 02/09/2013  . Other and unspecified hyperlipidemia 02/09/2013    [facility] ,MS, CCC-SLP  10/19/2017, 11:42 AM  [facility] [address] [place], [place], [address] Phone: [phone]   Fax:  [phone]  Name: Meghan Hickman MRN: [id number] Date of Birth: Feb 10, 1951

## 2017-10-19 NOTE — Patient Instructions (Signed)
   Pick something to read for about thirty seconds, 10x - Record and re-listen to each one after you do it.   Pick the two or three words that gave you the most difficulty and practice those words 5-10 times by themselves.

## 2017-10-20 ENCOUNTER — Ambulatory Visit: Payer: Federal, State, Local not specified - PPO | Admitting: Neurology

## 2017-10-26 ENCOUNTER — Ambulatory Visit: Payer: Federal, State, Local not specified - PPO | Admitting: Speech Pathology

## 2017-11-02 ENCOUNTER — Ambulatory Visit: Payer: Federal, State, Local not specified - PPO

## 2017-11-04 ENCOUNTER — Ambulatory Visit: Payer: Federal, State, Local not specified - PPO

## 2017-11-04 DIAGNOSIS — R482 Apraxia: Secondary | ICD-10-CM | POA: Diagnosis not present

## 2017-11-04 NOTE — Patient Instructions (Signed)
Pick an hour each day (preferably the same hour) and focus diligently on your speech rate slowing down.  Please complete the assigned speech therapy homework prior to your next session and return it to the speech therapist at your next visit.

## 2017-11-04 NOTE — Therapy (Signed)
[facility] [address] [place], [place], [address] Phone: [phone]   Fax:  [phone]  Speech Language Pathology Treatment  Patient Details  Name: Meghan Hickman MRN: [id number] Date of Birth: [birth year] Referring Provider: Levert Feinstein, MD   Encounter Date: 11/04/2017  End of Session - 11/04/17 1644    Visit Number  3    Number of Visits  17    Date for SLP Re-Evaluation  12/30/17    SLP Start Time  1533    SLP Stop Time   1615    SLP Time Calculation (min)  42 min    Activity Tolerance  Patient tolerated treatment well       Past Medical History:  Diagnosis Date  . Arthritis   . Depression   . Diabetes mellitus without complication (HCC)   . GERD (gastroesophageal reflux disease)   . Headache   . Heart murmur   . Hyperlipidemia   . Hypertension   . Speech problem   . Wears glasses     Past Surgical History:  Procedure Laterality Date  . CHOLECYSTECTOMY N/A 12/05/2014   Procedure: LAPAROSCOPIC CHOLECYSTECTOMY;  Surgeon: Emelia Loron, MD;  Location: [facility] OR;  Service: General;  Laterality: N/A;  . SHOULDER ARTHROSCOPY WITH SUBACROMIAL DECOMPRESSION AND BICEP TENDON REPAIR Right 04/23/2013   Procedure: SHOULDER ARTHROSCOPY WITH SUBACROMIAL DECOMPRESSION AND BICEP TENDON TENOTOMY;  Surgeon: Mable Paris, MD;  Location: [facility];  Service: Orthopedics;  Laterality: Right;    There were no vitals filed for this visit.  Subjective Assessment - 11/04/17 1538    Subjective  "It's (speech) not been good."    Currently in Pain?  No/denies            ADULT SLP TREATMENT - 11/04/17 1538      General Information   Behavior/Cognition  Alert;Cooperative;Pleasant mood      Treatment Provided   Treatment provided  Cognitive-Linquistic      Cognitive-Linquistic Treatment   Treatment focused on  Apraxia    Skilled Treatment  She relates that there has not been fast  improvement and she is disappointed. She maintained slowed rate with her HEP with 90% success, in sentence tasks she maintained lsowed rate 85% of the time, with only 0-5% of verbal output unintelligble. Pt stated, "This is so strange, because it seems like a stroke." SLP told pt that her symptoms are not unlike those of a CVA, however, re-educated pt that speech symptoms from a CVA would improve and not decline, as her speech skills have, and a CVA would result in immediate speech deficits, not a slow and gradual decline as in her case. SLP had pt think of two ways she could communicate non-verbally at a doctor's appointment and she did so without cues.      Assessment / Recommendations / Plan   Plan  Continue with current plan of care      Progression Toward Goals   Progression toward goals  Progressing toward goals       SLP Education - 11/04/17 1643    Education provided  Yes    Education Details  symptoms following CVA vs. symptoms pt has had, compensations for verbal communication    Person(s) Educated  Patient    Methods  Explanation    Comprehension  Verbalized understanding       SLP Short Term Goals - 11/04/17 1646      SLP SHORT TERM GOAL #1  Title  pt will demo HEP for apraxia with 70% slowed rate over two sessions    Baseline  11-04-17    Time  3    Period  Weeks    Status  On-going      SLP SHORT TERM GOAL #2   Title  pt will demo HEP for apraxia with functional articulation 90% of the time over two sessions    Time  3    Period  Weeks    Status  On-going      SLP SHORT TERM GOAL #3   Title  pt will completed structured speech tasks at the sentence level with reduced speech rate 80% of the time over three sessions    Baseline  11-04-17    Time  3    Period  Weeks    Status  On-going       SLP Long Term Goals - 11/04/17 1647      SLP LONG TERM GOAL #1   Title  pt will demo HEP for apraxia with 95% slowed rate over three sessions    Time  7    Period  Weeks     Status  Revised      SLP LONG TERM GOAL #2   Title  pt will demo HEP for apraxia with functional articulation 90% of the time over five sessions    Time  7    Period  Weeks    Status  On-going      SLP LONG TERM GOAL #3   Title  pt will demo 10 mintues functional simple to mod complex conversation with modified independence (using compensations) over three sessions    Time  7    Period  Weeks    Status  On-going       Plan - 11/04/17 1644    Clinical Impression Statement  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  Pt was again successful in almost eliminating the frequency of hesitation/pausing in sentence responses speech by reducing her speech rate. Pt would cont to benefit from skilled ST focusing on improving speech fluidity by structured speech tasks, and also by habituating reduced speech rate.    Speech Therapy Frequency  2x / week    Duration  -- 8 weeks / 17 sessions    Treatment/Interventions  Cueing hierarchy;SLP instruction and feedback;Other (comment);Compensatory techniques;Functional tasks;Patient/family education;Internal/external aids;Multimodal communcation approach    Potential to Achieve Goals  Fair    Potential Considerations  Previous level of function       Patient will benefit from skilled therapeutic intervention in order to improve the following deficits and impairments:   Verbal apraxia    Problem List Patient Active Problem List   Diagnosis Date Noted  . Expressive aphasia 09/15/2017  . Migraine 10/13/2016  . Insomnia 10/13/2016  . Essential hypertension 10/13/2016  . Moderate episode of recurrent major depressive disorder (HCC) 05/12/2016  . Small vessel disease, cerebrovascular 12/22/2015  . Slurred speech 07/24/2015  . Abnormal CT of brain 07/08/2015  . Speech abnormality 06/20/2015  . Thyroid nodule 06/20/2015  .  S/P laparoscopic cholecystectomy 12/05/2014  . Heart murmur 02/11/2013  . Type 2 diabetes mellitus, controlled (HCC) 02/09/2013  . Other and unspecified hyperlipidemia 02/09/2013    [facility],Reneta Niehaus ,MS, CCC-SLP  11/04/2017, 4:49 PM  [facility] [address] [place], [place], [address] Phone: [phone]   Fax:  [phone]   Name: Meghan Hickman MRN: [id number] Date of Birth: [birth year]

## 2017-11-07 ENCOUNTER — Ambulatory Visit: Payer: Federal, State, Local not specified - PPO | Attending: Neurology | Admitting: Speech Pathology

## 2017-11-07 DIAGNOSIS — R482 Apraxia: Secondary | ICD-10-CM | POA: Diagnosis not present

## 2017-11-08 ENCOUNTER — Ambulatory Visit (INDEPENDENT_AMBULATORY_CARE_PROVIDER_SITE_OTHER): Payer: Federal, State, Local not specified - PPO | Admitting: Neurology

## 2017-11-08 ENCOUNTER — Telehealth: Payer: Self-pay | Admitting: Neurology

## 2017-11-08 DIAGNOSIS — R0683 Snoring: Secondary | ICD-10-CM

## 2017-11-08 DIAGNOSIS — R4701 Aphasia: Secondary | ICD-10-CM

## 2017-11-08 DIAGNOSIS — E669 Obesity, unspecified: Secondary | ICD-10-CM

## 2017-11-08 DIAGNOSIS — G4726 Circadian rhythm sleep disorder, shift work type: Secondary | ICD-10-CM

## 2017-11-08 DIAGNOSIS — G4720 Circadian rhythm sleep disorder, unspecified type: Secondary | ICD-10-CM

## 2017-11-08 DIAGNOSIS — R351 Nocturia: Secondary | ICD-10-CM

## 2017-11-08 DIAGNOSIS — G4719 Other hypersomnia: Secondary | ICD-10-CM

## 2017-11-08 DIAGNOSIS — R0681 Apnea, not elsewhere classified: Secondary | ICD-10-CM

## 2017-11-08 DIAGNOSIS — G4733 Obstructive sleep apnea (adult) (pediatric): Secondary | ICD-10-CM

## 2017-11-08 NOTE — Therapy (Signed)
[facility] [address] [place], [place], [address] Phone: [phone]   Fax:  [phone]  Speech Language Pathology Treatment  Patient Details  Name: Meghan Hickman MRN: [id number] Date of Birth: September 08, 1950 Referring Provider: Levert Feinstein, MD   Encounter Date: 11/07/2017  End of Session - 11/08/17 1002    Visit Number  4    Number of Visits  17    Date for SLP Re-Evaluation  12/30/17    SLP Start Time  0847    SLP Stop Time   0930    SLP Time Calculation (min)  43 min    Activity Tolerance  Patient tolerated treatment well       Past Medical History:  Diagnosis Date  . Arthritis   . Depression   . Diabetes mellitus without complication (HCC)   . GERD (gastroesophageal reflux disease)   . Headache   . Heart murmur   . Hyperlipidemia   . Hypertension   . Speech problem   . Wears glasses     Past Surgical History:  Procedure Laterality Date  . CHOLECYSTECTOMY N/A 12/05/2014   Procedure: LAPAROSCOPIC CHOLECYSTECTOMY;  Surgeon: Emelia Loron, MD;  Location: [facility] OR;  Service: General;  Laterality: N/A;  . SHOULDER ARTHROSCOPY WITH SUBACROMIAL DECOMPRESSION AND BICEP TENDON REPAIR Right 04/23/2013   Procedure: SHOULDER ARTHROSCOPY WITH SUBACROMIAL DECOMPRESSION AND BICEP TENDON TENOTOMY;  Surgeon: Mable Paris, MD;  Location: [facility];  Service: Orthopedics;  Laterality: Right;    There were no vitals filed for this visit.  Subjective Assessment - 11/07/17 0850    Subjective  "It's going slowly."     Currently in Pain?  No/denies            ADULT SLP TREATMENT - 11/08/17 0847      General Information   Behavior/Cognition  Alert;Cooperative;Pleasant mood    Patient Positioning  Upright in chair      Treatment Provided   Treatment provided  Cognitive-Linquistic      Pain Assessment   Pain Assessment  No/denies pain      Cognitive-Linquistic Treatment   Treatment  focused on  Apraxia    Skilled Treatment  Patient demo'd HEP for apraxia with slowed rate . Occasional written cues provided to facilitate fluidity of speech. In simple structured speech tasks and simple conversation, pt demo'd slowed rate 80% of the time. Pt told SLP she initiates conversations less often because of her speech. She told SLP of a communication breakdown she had recently while having her tires changed. When she informed the attendant she had trouble speaking and spoke slowly, she was able to communicate her message. SLP encouraged pt to continue requesting additional time from her communication partners to reduce time pressure. SLP provided wallet card for pt with information and listening tips which she can show communication partners if she is having difficulty with her speech.       Assessment / Recommendations / Plan   Plan  Continue with current plan of care      Progression Toward Goals   Progression toward goals  Progressing toward goals       SLP Education - 11/07/17 0847    Education provided  Yes    Education Details  Request additional time from communication partners to reduce time pressure for speaking    Person(s) Educated  Patient    Methods  Explanation;Handout    Comprehension  Verbalized understanding  SLP Short Term Goals - 11/08/17 0954      SLP SHORT TERM GOAL #1   Title  pt will demo HEP for apraxia with 70% slowed rate over two sessions    Baseline  11-04-17, 11-07-17    Time  2    Period  Weeks    Status  Achieved      SLP SHORT TERM GOAL #2   Title  pt will demo HEP for apraxia with functional articulation 90% of the time over two sessions    Time  2    Period  Weeks    Status  On-going      SLP SHORT TERM GOAL #3   Title  pt will completed structured speech tasks at the sentence level with reduced speech rate 80% of the time over three sessions    Baseline  11-04-17,  11-07-17    Time  2    Period  Weeks    Status  On-going        SLP Long Term Goals - 11/08/17 1000      SLP LONG TERM GOAL #1   Title  pt will demo HEP for apraxia with 95% slowed rate over three sessions    Time  6    Period  Weeks    Status  On-going      SLP LONG TERM GOAL #2   Title  pt will demo HEP for apraxia with functional articulation 90% of the time over five sessions    Time  6    Period  Weeks    Status  On-going      SLP LONG TERM GOAL #3   Title  pt will demo 10 mintues functional simple to mod complex conversation with modified independence (using compensations) over three sessions    Time  6    Period  Weeks    Status  On-going       Plan - 11/07/17 0847    Clinical Impression Statement  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  Pt was again successful in almost eliminating the frequency of hesitation/pausing in sentence responses speech by reducing her speech rate. Pt would cont to benefit from skilled ST focusing on improving speech fluidity by structured speech tasks, and also by habituating reduced speech rate.    Speech Therapy Frequency  2x / week    Treatment/Interventions  Cueing hierarchy;SLP instruction and feedback;Other (comment);Compensatory techniques;Functional tasks;Patient/family education;Internal/external aids;Multimodal communcation approach    Potential to Achieve Goals  Fair    Potential Considerations  Previous level of function    Consulted and Agree with Plan of Care  Patient       Patient will benefit from skilled therapeutic intervention in order to improve the following deficits and impairments:   Verbal apraxia    Problem List Patient Active Problem List   Diagnosis Date Noted  . Expressive aphasia 09/15/2017  . Migraine 10/13/2016  . Insomnia 10/13/2016  . Essential hypertension 10/13/2016  . Moderate episode of recurrent major depressive disorder (HCC)  05/12/2016  . Small vessel disease, cerebrovascular 12/22/2015  . Slurred speech 07/24/2015  . Abnormal CT of brain 07/08/2015  . Speech abnormality 06/20/2015  . Thyroid nodule 06/20/2015  . S/P laparoscopic cholecystectomy 12/05/2014  . Heart murmur 02/11/2013  . Type 2 diabetes mellitus, controlled (HCC) 02/09/2013  . Other and unspecified hyperlipidemia 02/09/2013   Rondel BatonMary Beth Jehu Mccauslin, MS, CCC-SLP  Speech-Language Pathologist   Arlana Lindau 11/08/2017, 10:03 AM  [facility] [address] [place], [place], [address] Phone: [phone]   Fax:  [phone]   Name: Meghan Hickman MRN: [id number] Date of Birth: 10/30/1950

## 2017-11-08 NOTE — Telephone Encounter (Signed)
Her EEG came back normal. Left patient a detailed message, with results, on voicemail (ok per DPR).  Provided our number to call back with any questions.

## 2017-11-08 NOTE — Telephone Encounter (Signed)
Pt requesting a call back to know the result of her EEG.

## 2017-11-08 NOTE — Telephone Encounter (Signed)
Pt returning RNs call, stating that since the EEG came back normal what did Dr. Terrace ArabiaYan think the problem has been caused from.

## 2017-11-09 NOTE — Telephone Encounter (Signed)
Left message requesting a return call.

## 2017-11-09 NOTE — Addendum Note (Signed)
Addended by: Lindell SparKIRKMAN, Jaxie Racanelli C on: 11/09/2017 10:03 AM   Modules accepted: Orders

## 2017-11-09 NOTE — Telephone Encounter (Addendum)
Spoke to patient - she is currently in speech therapy now and is making mild improvements.  Dr. Terrace ArabiaYan has reviewed her chart.  A neurological cause has not been determined after an extensive work-up. Dr. Terrace ArabiaYan feels the next step would be [facility].  Patient is agreeable to this plan.

## 2017-11-10 ENCOUNTER — Telehealth: Payer: Self-pay

## 2017-11-10 NOTE — Telephone Encounter (Signed)
I called pt. I advised pt that Dr. Frances FurbishAthar reviewed their sleep study results and found that pt has severe osa with an O2 ndir of 73% and recommends that pt be treated with a cpap. Dr. Frances FurbishAthar recommends that pt return for a repeat sleep study in order to properly titrate the cpap and ensure a good mask fit. Pt is agreeable to returning for a titration study. I advised pt that our sleep lab will file with pt's insurance and call pt to schedule the sleep study when we hear back from the pt's insurance regarding coverage of this sleep study. Pt verbalized understanding of results.   Pt wants to know if she is a candidate for the oral appliance. I explained that with severe osa with oxygen desaturations, a cpap is the first line treatment, and a dental appliance is only indicated in certain patients with osa. Pt is still insistent that I ask Dr. Frances FurbishAthar if she is a candidate right now for an oral appliance. I advised pt that I will discuss this with Dr. Frances FurbishAthar, and if Dr. Frances FurbishAthar is willing to recommend an oral appliance at this time for the pt, then I will call her back. If Dr. Frances FurbishAthar is not agreeable and recommends cpap for her, then I will not call her back. Pt verbalized understanding and is agreeable to this plan.   I spoke with Dr. Frances FurbishAthar. She recommends at this time that pt try a cpap before attempting an oral appliance because of the severity of pt's osa.

## 2017-11-10 NOTE — Addendum Note (Signed)
Addended by: Huston FoleyATHAR, Yehuda Printup on: 11/10/2017 08:14 AM   Modules accepted: Orders

## 2017-11-10 NOTE — Progress Notes (Signed)
Patient referred by Dr. Terrace ArabiaYan, seen by me on 10/10/17, diagnostic PSG on 11/08/17.   Please call and notify the patient that the recent sleep study showed severe obstructive sleep apnea. I recommend treatment for this in the form of CPAP. This will require a repeat sleep study for proper titration and mask fitting and correct monitoring of the oxygen saturations. Please explain to patient. I have placed an order in the chart. Thanks.  Huston FoleySaima Jameire Kouba, MD, PhD [facility])

## 2017-11-10 NOTE — Procedures (Signed)
PATIENT'S NAME:  Meghan Hickman, Sarahelizabeth M DOB:      27-Aug-1950      MR#:    [id number]     DATE OF RECORDING: 11/08/2017 REFERRING M.D.: Levert FeinsteinYijun Yan, MD           PCP: Collie SiadZoe Stallings, MD Study Performed:   Baseline Polysomnogram HISTORY: 67 year old woman with a history of hypertension, hyperlipidemia, diabetes, history of slurring of speech, reflux disease, arthritis, and borderline obesity, who reports snoring and excessive daytime somnolence, as well as witnessed apneas. The patient endorsed the Epworth Sleepiness Scale at 7 points.  The patient's weight 155 pounds with a height of 60 (inches), resulting in a BMI of 30.3 kg/m2. The patient's neck circumference measured 15 inches.  CURRENT MEDICATIONS: Albuterol inhaler, Axert, Xanax, Lipitor, Lexapro, Estrace, Neurontin, Atrovert, Hyzaar, Glucophage, Multivitamins, Zantac, Restoril, Ultram   PROCEDURE:  This is a multichannel digital polysomnogram utilizing the Somnostar 11.2 system.  Electrodes and sensors were applied and monitored per AASM Specifications.   EEG, EOG, Chin and Limb EMG, were sampled at 200 Hz.  ECG, Snore and Nasal Pressure, Thermal Airflow, Respiratory Effort, CPAP Flow and Pressure, Oximetry was sampled at 50 Hz. Digital video and audio were recorded.      BASELINE STUDY  Lights Out was at 08:43 and Lights On at 14:44.  Total recording time (TRT) was 361.5 minutes, with a total sleep time (TST) of 200.5 minutes.   The patient's sleep latency was 27.5 minutes.  REM latency was 113.5 minutes.  The sleep efficiency was 55.5 %.     SLEEP ARCHITECTURE: WASO (Wake after sleep onset) was high and patient was unable to return to sleep after 12:32.  There were 4.5 minutes in Stage N1, 95.5 minutes Stage N2, 77.5 minutes Stage N3 and 23 minutes in Stage REM.  The percentage of Stage N1 was 2.2%, Stage N2 was 47.6%, Stage N3 was 38.7% and Stage R (REM sleep) was 11.5%. The arousals were noted as: 11 were spontaneous, 0 were associated with PLMs, 104  were associated with respiratory events.  Audio and video analysis did not show any abnormal or unusual movements, behaviors, phonations or vocalizations. The patient took 1 bathroom break. Mild to moderate snoring was noted. The EKG was in keeping with normal sinus rhythm (NSR).  RESPIRATORY ANALYSIS:  There were a total of 104 respiratory events:  31 obstructive apneas, 0 central apneas and 0 mixed apneas with a total of 31 apneas and an apnea index (AI) of 9.3 /hour. There were 73 hypopneas with a hypopnea index of 21.8 /hour. The patient also had 0 respiratory event related arousals (RERAs).      The total APNEA/HYPOPNEA INDEX (AHI) was 31.1/hour and the total RESPIRATORY DISTURBANCE INDEX was 31.1 /hour.  29 events occurred in REM sleep and 108 events in NREM. The REM AHI was 75.7 /hour, versus a non-REM AHI of 25.4. The patient spent 17 minutes of total sleep time in the supine position and 184 minutes in non-supine.. The supine AHI was 45.9 versus a non-supine AHI of 29.7.  OXYGEN SATURATION & C02:  The Wake baseline 02 saturation was 95%, with the lowest being 73%. Time spent below 89% saturation equaled 39 minutes.  PERIODIC LIMB MOVEMENTS: The patient had a total of 0 Periodic Limb Movements.  The Periodic Limb Movement (PLM) index was 0 and the PLM Arousal index was 0/hour.  Post-study, the patient indicated that sleep was worse than usual.   IMPRESSION:  1. Obstructive Sleep Apnea (OSA)  2. Dysfunctions associated with sleep stages or arousal from sleep  RECOMMENDATIONS:  1. This study demonstrates severe obstructive sleep apnea, with a total AHI of 31.1/hour, REM AHI of 75.7/hour, supine AHI of 45.9/hour and O2 nadir of 73%. Treatment with positive airway pressure in the form of CPAP is recommended. This will require a full night titration study to optimize therapy. Other treatment options may include avoidance of supine sleep position along with weight loss, upper airway or jaw  surgery in selected patients or the use of an oral appliance in certain patients. ENT evaluation and/or consultation with a maxillofacial surgeon or dentist may be feasible in some instances.    2. Please note that untreated obstructive sleep apnea can carry additional perioperative morbidity. Patients with significant obstructive sleep apnea should receive perioperative PAP therapy and the surgeons and particularly the anesthesiologist should be informed of the diagnosis and the severity of the sleep disordered breathing. 3. This study shows sleep fragmentation and abnormal sleep stage percentages; these are nonspecific findings and per se do not signify an intrinsic sleep disorder or a cause for the patient's sleep-related symptoms. Causes include (but are not limited to) the first night effect of the sleep study, circadian rhythm disturbances, medication effect or an underlying mood disorder or medical problem.  4. The patient should be cautioned not to drive, work at heights, or operate dangerous or heavy equipment when tired or sleepy. Review and reiteration of good sleep hygiene measures should be pursued with any patient. 5. The patient will be seen in follow-up by Dr. Frances Furbish at [facility] for discussion of the test results and further management strategies. The referring provider will be notified of the test results.  I certify that I have reviewed the entire raw data recording prior to the issuance of this report in accordance with the Standards of Accreditation of the American Academy of Sleep Medicine (AASM)   Huston Foley, MD, PhD Diplomat, American Board of Psychiatry and Neurology (Neurology and Sleep Medicine)

## 2017-11-10 NOTE — Telephone Encounter (Signed)
-----   Message from Huston FoleySaima Athar, MD sent at 11/10/2017  8:14 AM EDT ----- Patient referred by Dr. Terrace ArabiaYan, seen by me on 10/10/17, diagnostic PSG on 11/08/17.   Please call and notify the patient that the recent sleep study showed severe obstructive sleep apnea. I recommend treatment for this in the form of CPAP. This will require a repeat sleep study for proper titration and mask fitting and correct monitoring of the oxygen saturations. Please explain to patient. I have placed an order in the chart. Thanks.  Huston FoleySaima Athar, MD, PhD [facility])

## 2017-11-11 ENCOUNTER — Ambulatory Visit: Payer: Federal, State, Local not specified - PPO

## 2017-11-11 DIAGNOSIS — R482 Apraxia: Secondary | ICD-10-CM

## 2017-11-11 NOTE — Patient Instructions (Signed)
Pick 2 30-minute time periods to work on your speech, the same time each day, to habitualize your slower speech.

## 2017-11-11 NOTE — Therapy (Signed)
[facility] [address] [place], [place], [address] Phone: [phone]   Fax:  [phone]  Speech Language Pathology Treatment  Patient Details  Name: Meghan GardenerLinda M Hickman MRN: [id number] Date of Birth: [birth year] Referring Provider: Levert FeinsteinYan, Yijun, MD   Encounter Date: 11/11/2017  End of Session - 11/11/17 0855    Visit Number  5    Number of Visits  17    Date for SLP Re-Evaluation  12/30/17    SLP Start Time  0810 pt 9 minutes late    SLP Stop Time   0849    SLP Time Calculation (min)  39 min    Activity Tolerance  Patient tolerated treatment well       Past Medical History:  Diagnosis Date  . Arthritis   . Depression   . Diabetes mellitus without complication (HCC)   . GERD (gastroesophageal reflux disease)   . Headache   . Heart murmur   . Hyperlipidemia   . Hypertension   . Speech problem   . Wears glasses     Past Surgical History:  Procedure Laterality Date  . CHOLECYSTECTOMY N/A 12/05/2014   Procedure: LAPAROSCOPIC CHOLECYSTECTOMY;  Surgeon: Emelia LoronMatthew Wakefield, MD;  [facility];  Service: General;  Laterality: N/A;  . SHOULDER ARTHROSCOPY WITH SUBACROMIAL DECOMPRESSION AND BICEP TENDON REPAIR Right 04/23/2013   Procedure: SHOULDER ARTHROSCOPY WITH SUBACROMIAL DECOMPRESSION AND BICEP TENDON TENOTOMY;  Surgeon: Mable ParisJustin William Chandler, MD;  Location: [facility];  Service: Orthopedics;  Laterality: Right;    There were no vitals filed for this visit.  Subjective Assessment - 11/11/17 0810    Subjective  Pt agreed that last session with other SLP went well.    Currently in Pain?  No/denies            ADULT SLP TREATMENT - 11/11/17 0811      General Information   Behavior/Cognition  Alert;Cooperative;Pleasant mood    Patient Positioning  Upright in chair      Treatment Provided   Treatment provided  Cognitive-Linquistic      Cognitive-Linquistic Treatment   Treatment focused  on  Apraxia    Skilled Treatment  Pt talked with SLP re: the weather and how her body responds. With the HEP (to help pt habitualize slowed speech rate to compensate for apraxic-like symptoms - pt read these at 95-100% accuracy, with slowed rate 100% of the time. Pt reports that she has less-frequent episodes where she gets frustrated. SLP engaged with pt in mod complex conversation for 8 minutes and pt produced functional speech with slowed rate. Pt talked with SLP re: the basis of her speech difficulty and going to [employer]; pt accomplished this with fluent speech/slower speech rate. SLP told pt to pick 2 30-minute time blocks to especially FOCUS on slower talking. Pt agreed to this.      Assessment / Recommendations / Plan   Plan  Continue with current plan of care      Progression Toward Goals   Progression toward goals  Progressing toward goals       SLP Education - 11/11/17 0854    Education provided  Yes    Education Details  pick 2 half-our time blocks to focus on slower rate of speech    Person(s) Educated  Patient    Methods  Explanation    Comprehension  Verbalized understanding       SLP Short Term Goals - 11/11/17 [id number]      SLP  SHORT TERM GOAL #1   Title  pt will demo HEP for apraxia with 70% slowed rate over two sessions    Status  Achieved      SLP SHORT TERM GOAL #2   Title  pt will demo HEP for apraxia with functional articulation 90% of the time over two sessions    Baseline  11-11-17    Time  2    Period  Weeks    Status  On-going      SLP SHORT TERM GOAL #3   Title  pt will completed structured speech tasks at the sentence level with reduced speech rate 80% of the time over three sessions    Status  Achieved       SLP Long Term Goals - 11/11/17 0857      SLP LONG TERM GOAL #1   Title  pt will demo HEP for apraxia with 95% slowed rate over three sessions    Baseline  11-11-17    Time  6    Period  Weeks    Status  On-going      SLP LONG TERM GOAL #2   Title   pt will demo HEP for apraxia with functional articulation 90% of the time over five sessions    Baseline  11-11-17    Time  6    Period  Weeks    Status  On-going      SLP LONG TERM GOAL #3   Title  pt will demo 10 mintues functional simple to mod complex conversation with modified independence (using compensations) over three sessions    Time  6    Period  Weeks    Status  On-going       Plan - 11/11/17 1610    Clinical Impression Statement  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  Pt was successful with simple and mod complex today -speech was functional- by almost eliminating the frequency of hesitation/pausing in sentence responses speech by reducing her speech rate. Pt would cont to benefit from skilled ST focusing on improving speech fluidity by structured speech tasks, and also by habituating reduced speech rate.    Speech Therapy Frequency  2x / week    Treatment/Interventions  Cueing hierarchy;SLP instruction and feedback;Other (comment);Compensatory techniques;Functional tasks;Patient/family education;Internal/external aids;Multimodal communcation approach    Potential to Achieve Goals  Fair    Potential Considerations  Previous level of function    Consulted and Agree with Plan of Care  Patient       Patient will benefit from skilled therapeutic intervention in order to improve the following deficits and impairments:   Verbal apraxia    Problem List Patient Active Problem List   Diagnosis Date Noted  . Expressive aphasia 09/15/2017  . Migraine 10/13/2016  . Insomnia 10/13/2016  . Essential hypertension 10/13/2016  . Moderate episode of recurrent major depressive disorder (HCC) 05/12/2016  . Small vessel disease, cerebrovascular 12/22/2015  . Slurred speech 07/24/2015  . Abnormal CT of brain 07/08/2015  . Speech abnormality 06/20/2015  .  Thyroid nodule 06/20/2015  . S/P laparoscopic cholecystectomy 12/05/2014  . Heart murmur 02/11/2013  . Type 2 diabetes mellitus, controlled (HCC) 02/09/2013  . Other and unspecified hyperlipidemia 02/09/2013    [facility] ,MS, CCC-SLP  11/11/2017, 5:26 PM  [facility] [address] [place], [place], [address] Phone: [phone]   Fax:  [phone]  Name: Meghan Hickman MRN: [id number] Date of Birth: Jan 27, 1951

## 2017-11-14 ENCOUNTER — Ambulatory Visit: Payer: Federal, State, Local not specified - PPO

## 2017-11-14 ENCOUNTER — Telehealth: Payer: Self-pay

## 2017-11-14 DIAGNOSIS — R482 Apraxia: Secondary | ICD-10-CM

## 2017-11-14 NOTE — Telephone Encounter (Signed)
Pt did not sleep well in lab for daytime study. Think we can just do an Auto-CPAP home??  Also, due to pt's work schedule, pt is only interested on Tuesdays to be schedule. First Tuesday available in lab is not until May 28th.

## 2017-11-14 NOTE — Therapy (Signed)
[facility] [address] [place], [place], [address] Phone: [phone]   Fax:  [phone]  Speech Language Pathology Treatment  Patient Details  Name: Meghan Hickman MRN: [id number] Date of Birth: [birth year] Referring Provider: Levert Feinstein, MD   Encounter Date: 11/14/2017  End of Session - 11/14/17 1632    Visit Number  6    Number of Visits  17    Date for SLP Re-Evaluation  12/30/17    SLP Start Time  1537 pt 6 minutes late    SLP Stop Time   1620    SLP Time Calculation (min)  43 min    Activity Tolerance  Patient tolerated treatment well       Past Medical History:  Diagnosis Date  . Arthritis   . Depression   . Diabetes mellitus without complication (HCC)   . GERD (gastroesophageal reflux disease)   . Headache   . Heart murmur   . Hyperlipidemia   . Hypertension   . Speech problem   . Wears glasses     Past Surgical History:  Procedure Laterality Date  . CHOLECYSTECTOMY N/A 12/05/2014   Procedure: LAPAROSCOPIC CHOLECYSTECTOMY;  Surgeon: Emelia Loron, MD;  Location[facility] OR;  Service: General;  Laterality: N/A;  . SHOULDER ARTHROSCOPY WITH SUBACROMIAL DECOMPRESSION AND BICEP TENDON REPAIR Right 04/23/2013   Procedure: SHOULDER ARTHROSCOPY WITH SUBACROMIAL DECOMPRESSION AND BICEP TENDON TENOTOMY;  Surgeon: Mable Paris, MD;  Location:  SURGERY CENTER;  Service: Orthopedics;  Laterality: Right;    There were no vitals filed for this visit.  Subjective Assessment - 11/14/17 1539    Subjective  Pt talked with co-workers, cousin over the weekend.     Currently in Pain?  No/denies            ADULT SLP TREATMENT - 11/14/17 1541      General Information   Behavior/Cognition  Alert;Cooperative;Pleasant mood      Treatment Provided   Treatment provided  Cognitive-Linquistic      Cognitive-Linquistic Treatment   Treatment focused on  Apraxia    Skilled Treatment  Pt  reports speech was more frustrating than pleasing over the weekend, due to "my brain is faster than my mouth."  Pt tells SLP "it's tiring" having to think about talking slower. SLP introduced pt to Speech Easy device and provided name of SLP who is trained to fit/treat pts with this device. SLP worked with pt at the reading level first, with pt's greatest difficulty coming with words having >2 syllables. Pt intelligibility was 90% during this task; pt self-corrected for 95%+ intelligibility.  Speech responses produced spontaneously in 7 minute conversation were generated with slowed rate functionally 95-100% of the time. In the middle of the session pt stated, "I wonder why my brain is doing this."       Assessment / Recommendations / Plan   Plan  Continue with current plan of care      Progression Toward Goals   Progression toward goals  Progressing toward goals         SLP Short Term Goals - 11/14/17 1649      SLP SHORT TERM GOAL #1   Title  pt will demo HEP for apraxia with 70% slowed rate over two sessions    Status  Achieved      SLP SHORT TERM GOAL #2   Title  pt will demo HEP for apraxia with functional articulation 90% of the time over two sessions  Baseline  11-11-17    Time  1    Period  Weeks    Status  On-going      SLP SHORT TERM GOAL #3   Title  pt will completed structured speech tasks at the sentence level with reduced speech rate 80% of the time over three sessions    Status  Achieved       SLP Long Term Goals - 11/14/17 1649      SLP LONG TERM GOAL #1   Title  pt will demo HEP for apraxia with 95% slowed rate over three sessions    Baseline  11-11-17    Time  5    Period  Weeks    Status  On-going      SLP LONG TERM GOAL #2   Title  pt will demo HEP for apraxia with functional articulation 90% of the time over five sessions    Baseline  11-11-17    Time  5    Period  Weeks    Status  On-going      SLP LONG TERM GOAL #3   Title  pt will demo 10 mintues  functional simple to mod complex conversation with modified independence (using compensations) over three sessions    Baseline  11-14-17    Time  5    Period  Weeks    Status  On-going       Plan - 11/14/17 1632    Clinical Impression Statement  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  Pt was successful with simple and mod complex today -speech was functional- by almost eliminating the frequency of hesitation/pausing in sentence responses speech by reducing her speech rate. Pt would cont to benefit from skilled ST focusing on improving speech fluidity by structured speech tasks, and also by habituating reduced speech rate.  (Pended)     Speech Therapy Frequency  2x / week  (Pended)     Treatment/Interventions  Cueing hierarchy;SLP instruction and feedback;Other (comment);Compensatory techniques;Functional tasks;Patient/family education;Internal/external aids;Multimodal communcation approach  (Pended)     Potential to Achieve Goals  Fair  (Pended)     Potential Considerations  Previous level of function  (Pended)     Consulted and Agree with Plan of Care  Patient  (Pended)        Patient will benefit from skilled therapeutic intervention in order to improve the following deficits and impairments:   Verbal apraxia    Problem List Patient Active Problem List   Diagnosis Date Noted  . Expressive aphasia 09/15/2017  . Migraine 10/13/2016  . Insomnia 10/13/2016  . Essential hypertension 10/13/2016  . Moderate episode of recurrent major depressive disorder (HCC) 05/12/2016  . Small vessel disease, cerebrovascular 12/22/2015  . Slurred speech 07/24/2015  . Abnormal CT of brain 07/08/2015  . Speech abnormality 06/20/2015  . Thyroid nodule 06/20/2015  . S/P laparoscopic cholecystectomy 12/05/2014  . Heart murmur 02/11/2013  . Type 2 diabetes mellitus, controlled  (HCC) 02/09/2013  . Other and unspecified hyperlipidemia 02/09/2013    [facility],Niyla Marone ,MS, CCC-SLP  11/14/2017, 4:50 PM  [facility] [address] [place], [place], [address] Phone: [phone]   Fax:  [phone]   Name: Meghan Hickman MRN: [id number] Date of Birth: 01-Sep-1950

## 2017-11-14 NOTE — Telephone Encounter (Signed)
I would recommend an in house sleep study for proper titration and monitoring her oxygen saturations, I am sure she understands that we may be limited in our appointments if she has only Tuesday mornings available. Please advise patient that we have limited appts for daytime testing. She may opt for an overnight sleep study if she were to be able to come on a weekend.

## 2017-11-14 NOTE — Patient Instructions (Signed)
Read for 2 minutes at least once/day in a slower rate.

## 2017-11-16 ENCOUNTER — Ambulatory Visit: Payer: Federal, State, Local not specified - PPO

## 2017-11-22 ENCOUNTER — Ambulatory Visit: Payer: Federal, State, Local not specified - PPO

## 2017-11-24 ENCOUNTER — Ambulatory Visit: Payer: Federal, State, Local not specified - PPO | Admitting: Speech Pathology

## 2017-11-28 ENCOUNTER — Ambulatory Visit: Payer: Federal, State, Local not specified - PPO

## 2017-11-28 DIAGNOSIS — R482 Apraxia: Secondary | ICD-10-CM | POA: Diagnosis not present

## 2017-11-28 NOTE — Patient Instructions (Signed)
   [employer]  [address] [place] [address]  SLPMarland Kitchen- Lyndee LeoMaria Lucente  [phone]

## 2017-11-28 NOTE — Therapy (Signed)
[facility] [address] [place], [place], [address] Phone: [phone]   Fax:  [phone]  Speech Language Pathology Treatment  Patient Details  Name: Meghan Hickman MRN: [id number] Date of Birth: Jun 22, 1951 Referring Provider: Levert Feinstein, MD   Encounter Date: 11/28/2017  End of Session - 11/28/17 1628    Visit Number  7    Number of Visits  17    Date for SLP Re-Evaluation  12/30/17    SLP Start Time  1541    SLP Stop Time   1615    SLP Time Calculation (min)  34 min    Activity Tolerance  Patient tolerated treatment well       Past Medical History:  Diagnosis Date  . Arthritis   . Depression   . Diabetes mellitus without complication (HCC)   . GERD (gastroesophageal reflux disease)   . Headache   . Heart murmur   . Hyperlipidemia   . Hypertension   . Speech problem   . Wears glasses     Past Surgical History:  Procedure Laterality Date  . CHOLECYSTECTOMY N/A 12/05/2014   Procedure: LAPAROSCOPIC CHOLECYSTECTOMY;  Surgeon: Emelia Loron, MD;  Location: [facility] OR;  Service: General;  Laterality: N/A;  . SHOULDER ARTHROSCOPY WITH SUBACROMIAL DECOMPRESSION AND BICEP TENDON REPAIR Right 04/23/2013   Procedure: SHOULDER ARTHROSCOPY WITH SUBACROMIAL DECOMPRESSION AND BICEP TENDON TENOTOMY;  Surgeon: Mable Paris, MD;  Location: [facility];  Service: Orthopedics;  Laterality: Right;    There were no vitals filed for this visit.  Subjective Assessment - 11/28/17 1626    Subjective  Pt 10 minutes late to ST today. "I wonder why this is happening to my speech."    Currently in Pain?  No/denies            ADULT SLP TREATMENT - 11/28/17 1543      General Information   Behavior/Cognition  Alert;Cooperative;Pleasant mood      Treatment Provided   Treatment provided  Cognitive-Linquistic      Cognitive-Linquistic Treatment   Treatment focused on  Apraxia    Skilled Treatment   Pt looked at speech easy device online and stated it was expensive. SLP encouraged pt to talk with SLP associated with the device and see what insurance coverage would be. Pt expressed frustration at strangers who do not want to wait for her to talk - makes her more anxious and she finds it difficult to talk around these types of people. SLP discussed some strategies to help engage with these people. Pt states family and friends are not difficult to talk with compensatory techniques. In structured tasks of sentence and multi-sentence responses pt was 95-100% successful at slowing rate for functional communication. In mod complex conversation pt was functional for 15 minutes.       Assessment / Recommendations / Plan   Plan  Continue with current plan of care d/c likely week of 12-12-17/4 more sessions      Progression Toward Goals   Progression toward goals  Progressing toward goals       SLP Education - 11/28/17 1628    Education provided  Yes    Education Details  Compensatinos for people who do not want pt to take extra time    Person(s) Educated  Patient    Methods  Explanation    Comprehension  Verbalized understanding       SLP Short Term Goals - 11/28/17 1631  SLP SHORT TERM GOAL #1   Title  pt will demo HEP for apraxia with 70% slowed rate over two sessions    Status  Achieved      SLP SHORT TERM GOAL #2   Title  pt will demo HEP for apraxia with functional articulation 90% of the time over two sessions    Status  Deferred see LTG      SLP SHORT TERM GOAL #3   Title  pt will completed structured speech tasks at the sentence level with reduced speech rate 80% of the time over three sessions    Status  Achieved       SLP Long Term Goals - 11/28/17 1612      SLP LONG TERM GOAL #1   Title  pt will demo HEP for apraxia with 95% slowed rate over three sessions    Baseline  11-11-17,    Time  4    Period  Weeks    Status  On-going      SLP LONG TERM GOAL #2   Title  pt  will demo HEP for apraxia with functional articulation 90% of the time over five sessions    Baseline  11-11-17,    Time  4    Period  Weeks    Status  On-going      SLP LONG TERM GOAL #3   Title  pt will demo 10 mintues functional simple to mod complex conversation with modified independence (using compensations) over three sessions    Baseline  11-14-17, 11-28-17    Time  4    Period  Weeks    Status  On-going       Plan - 11/28/17 1628    Clinical Impression Statement  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  In structured tasks and in conversation with SLP pt was successful approx 95% of the time and speech was functional, however pt needs more practice outside ST room as she states talking with strangers is still more difficult and not as successful. Pt will likely be seen for approx 4 more sessions to practice conversation outside of ST room. and outdoors. Pt would cont to benefit from skilled ST focusing on improving speech fluidity by habituating reduced speech rate outside of ST room.    Speech Therapy Frequency  2x / week    Treatment/Interventions  Cueing hierarchy;SLP instruction and feedback;Other (comment);Compensatory techniques;Functional tasks;Patient/family education;Internal/external aids;Multimodal communcation approach    Potential to Achieve Goals  Fair    Potential Considerations  Previous level of function    Consulted and Agree with Plan of Care  Patient       Patient will benefit from skilled therapeutic intervention in order to improve the following deficits and impairments:   Verbal apraxia    Problem List Patient Active Problem List   Diagnosis Date Noted  . Expressive aphasia 09/15/2017  . Migraine 10/13/2016  . Insomnia 10/13/2016  . Essential hypertension 10/13/2016  . Moderate episode of recurrent major depressive disorder  (HCC) 05/12/2016  . Small vessel disease, cerebrovascular 12/22/2015  . Slurred speech 07/24/2015  . Abnormal CT of brain 07/08/2015  . Speech abnormality 06/20/2015  . Thyroid nodule 06/20/2015  . S/P laparoscopic cholecystectomy 12/05/2014  . Heart murmur 02/11/2013  . Type 2 diabetes mellitus, controlled (HCC) 02/09/2013  . Other and unspecified hyperlipidemia 02/09/2013    [facility]IN[facility],Amritha Yorke ,MS, CCC-SLP  11/28/2017, 4:32 PM  [facility] [address] [place], [place], [address] Phone: [phone]   Fax:  [phone]   Name: Meghan Hickman MRN: [id number] Date of Birth: 30-Oct-1950

## 2017-12-02 ENCOUNTER — Encounter: Payer: Self-pay | Admitting: Urgent Care

## 2017-12-02 ENCOUNTER — Ambulatory Visit: Payer: Federal, State, Local not specified - PPO | Admitting: Urgent Care

## 2017-12-02 ENCOUNTER — Ambulatory Visit: Payer: Federal, State, Local not specified - PPO

## 2017-12-02 ENCOUNTER — Ambulatory Visit: Payer: Self-pay | Admitting: *Deleted

## 2017-12-02 VITALS — BP 130/76 | HR 100 | Temp 98.6°F | Resp 18 | Ht 60.0 in | Wt 154.0 lb

## 2017-12-02 DIAGNOSIS — R1084 Generalized abdominal pain: Secondary | ICD-10-CM | POA: Diagnosis not present

## 2017-12-02 DIAGNOSIS — K5732 Diverticulitis of large intestine without perforation or abscess without bleeding: Secondary | ICD-10-CM | POA: Diagnosis not present

## 2017-12-02 DIAGNOSIS — D72829 Elevated white blood cell count, unspecified: Secondary | ICD-10-CM

## 2017-12-02 DIAGNOSIS — E860 Dehydration: Secondary | ICD-10-CM

## 2017-12-02 DIAGNOSIS — R195 Other fecal abnormalities: Secondary | ICD-10-CM

## 2017-12-02 DIAGNOSIS — R197 Diarrhea, unspecified: Secondary | ICD-10-CM | POA: Diagnosis not present

## 2017-12-02 DIAGNOSIS — R1032 Left lower quadrant pain: Secondary | ICD-10-CM | POA: Diagnosis not present

## 2017-12-02 LAB — POCT CBC
Granulocyte percent: 82.7 %G — AB (ref 37–80)
HCT, POC: 39.8 % (ref 37.7–47.9)
HEMOGLOBIN: 12.7 g/dL (ref 12.2–16.2)
Lymph, poc: 2.2 (ref 0.6–3.4)
MCH, POC: 27 pg (ref 27–31.2)
MCHC: 31.9 g/dL (ref 31.8–35.4)
MCV: 84.5 fL (ref 80–97)
MID (cbc): 0.3 (ref 0–0.9)
MPV: 7.3 fL (ref 0–99.8)
POC Granulocyte: 11.8 — AB (ref 2–6.9)
POC LYMPH PERCENT: 15.4 %L (ref 10–50)
POC MID %: 1.9 %M (ref 0–12)
Platelet Count, POC: 352 10*3/uL (ref 142–424)
RBC: 4.71 M/uL (ref 4.04–5.48)
RDW, POC: 14.2 %
WBC: 14.3 10*3/uL — AB (ref 4.6–10.2)

## 2017-12-02 LAB — POCT URINALYSIS DIP (MANUAL ENTRY)
BILIRUBIN UA: NEGATIVE mg/dL
Blood, UA: NEGATIVE
Glucose, UA: NEGATIVE mg/dL
LEUKOCYTES UA: NEGATIVE
NITRITE UA: NEGATIVE
Spec Grav, UA: 1.02 (ref 1.010–1.025)
Urobilinogen, UA: 1 E.U./dL
pH, UA: 6 (ref 5.0–8.0)

## 2017-12-02 MED ORDER — AMOXICILLIN-POT CLAVULANATE 875-125 MG PO TABS: 1.0000 | ORAL_TABLET | Freq: Two times a day (BID) | ORAL | 0 refills | Status: DC

## 2017-12-02 NOTE — Telephone Encounter (Signed)
Appointment made  With Wallis Bambergmario mani  Today at 5  Pm   Pt  Verbalizes  Knowledge    Reason for Disposition . [1] MODERATE diarrhea (e.g., 4-6 times / day more than normal) AND [2] present > 48 hours (2 days)  Answer Assessment - Initial Assessment Questions 1. DIARRHEA SEVERITY: "How bad is the diarrhea?" "How many extra stools have you had in the past 24 hours than normal?"    - MILD: Few loose or mushy BMs; increase of 1-3 stools over normal daily number of stools; mild increase in ostomy output.   - MODERATE: Increase of 4-6 stools daily over normal; moderate increase in ostomy output.   - SEVERE (or Worst Possible): Increase of 7 or more stools daily over normal; moderate increase in ostomy output; incontinence.     4     2. ONSET: "When did the diarrhea begin?"      Yesterday   3. BM CONSISTENCY: "How loose or watery is the diarrhea?"        Loose   4. VOMITING: "Are you also vomiting?" If so, ask: "How many times in the past 24 hours?"     No  5. ABDOMINAL PAIN: "Are you having any abdominal pain?" If yes: "What does it feel like?" (e.g., crampy, dull, intermittent, constant)      Crampy  intermittant   6. ABDOMINAL PAIN SEVERITY: If present, ask: "How bad is the pain?"  (e.g., Scale 1-10; mild, moderate, or severe)    - MILD (1-3): doesn't interfere with normal activities, abdomen soft and not tender to touch     - MODERATE (4-7): interferes with normal activities or awakens from sleep, tender to touch     - SEVERE (8-10): excruciating pain, doubled over, unable to do any normal activities         7  Moderate   7. ORAL INTAKE: If vomiting, "Have you been able to drink liquids?" "How much fluids have you had in the past 24 hours?"       N/a  8. HYDRATION: "Any signs of dehydration?" (e.g., dry mouth [not just dry lips], too weak to stand, dizziness, new weight loss) "When did you last urinate?"      Slightly  Weak  Last urinated  30  Minutes  Ago   9. EXPOSURE: "Have you traveled to a  foreign country recently?" "Have you been exposed to anyone with diarrhea?" "Could you have eaten any food that was spoiled?"     NO 10. OTHER SYMPTOMS: "Do you have any other symptoms?" (e.g., fever, blood in stool)       Fever last night better today   11. PREGNANCY: "Is there any chance you are pregnant?" "When was your last menstrual period?"       NO  Protocols used: DIARRHEA-A-AH

## 2017-12-02 NOTE — Progress Notes (Signed)
MRN: [id number] DOB: 07-Oct-1950  Subjective:   Meghan Hickman is a 67 y.o. female presenting for 2 day history of left sided abdominal pain, nausea with vomiting (2-3 episodes), subjective fever, 3-4 loose stools per day. Denies dark tarry stools, bloody stools, constipation. Has also had an intermittent frontal headache. She has a history of diverticulitis as seen on CT scan from 11/30/2015. She has taken an oral liquid medication for her upset stomach but cannot recall the name. Denies chest pain, shortness of breath, bloating, dysuria, hematuria, confusion, dizziness, double vision, photophobia. Quit smoking in 2013, denies alcohol use.   Meghan QuinLinda has a current medication list which includes the following prescription(s): albuterol, almotriptan, alprazolam, aspirin, atorvastatin, escitalopram, estradiol, gabapentin, ipratropium, losartan-hydrochlorothiazide, metformin, multiple vitamins-minerals, ranitidine, temazepam, and tramadol. Also has No Known Allergies.  Meghan QuinLinda  has a past medical history of Arthritis, Depression, Diabetes mellitus without complication (HCC), GERD (gastroesophageal reflux disease), Headache, Heart murmur, Hyperlipidemia, Hypertension, Speech problem, and Wears glasses. Also  has a past surgical history that includes Shoulder arthroscopy with subacromial decompression and bicep tendon repair (Right, 04/23/2013) and Cholecystectomy (N/A, 12/05/2014).  Objective:   Vitals: BP 130/76   Pulse 100   Temp 98.6 F (37 C) (Oral)   Resp 18   Ht 5' (1.524 m)   Wt 154 lb (69.9 kg)   SpO2 99%   BMI 30.08 kg/m   Physical Exam  Constitutional: She is oriented to person, place, and time. She appears well-developed and well-nourished.  HENT:  Mucous membranes dry.  Eyes: Pupils are equal, round, and reactive to light. EOM are normal. Right eye exhibits no discharge. Left eye exhibits no discharge. No scleral icterus.  Neck: Normal range of motion. Neck supple. No JVD present.    Cardiovascular: Normal rate, regular rhythm and intact distal pulses. Exam reveals no gallop and no friction rub.  No murmur heard. Pulmonary/Chest: No respiratory distress. She has no wheezes. She has no rhonchi. She has no rales.  Abdominal: Soft. Bowel sounds are normal. She exhibits no distension and no mass. There is no hepatosplenomegaly. There is tenderness (exquisite over LLQ) in the left lower quadrant. There is guarding. There is no rigidity, no rebound, no CVA tenderness, no tenderness at McBurney's point and negative Murphy's sign.  No ecchymosis.  Musculoskeletal: She exhibits no edema.  Neurological: She is alert and oriented to person, place, and time. She displays normal reflexes. No cranial nerve deficit. Coordination normal.  Skin: Skin is warm and dry. No rash noted. No erythema. No pallor.  Psychiatric: She has a normal mood and affect.   Results for orders placed or performed in visit on 12/02/17 (from the past 24 hour(s))  POCT urinalysis dipstick     Status: Abnormal   Collection Time: 12/02/17  5:36 PM  Result Value Ref Range   Color, UA yellow yellow   Clarity, UA clear clear   Glucose, UA negative negative mg/dL   Bilirubin, UA small (A) negative   Ketones, POC UA negative negative mg/dL   Spec Grav, UA 4.0981.020 1.1911.010 - 1.025   Blood, UA negative negative   pH, UA 6.0 5.0 - 8.0   Protein Ur, POC trace (A) negative mg/dL   Urobilinogen, UA 1.0 0.2 or 1.0 E.U./dL   Nitrite, UA Negative Negative   Leukocytes, UA Negative Negative  POCT CBC     Status: Abnormal   Collection Time: 12/02/17  5:37 PM  Result Value Ref Range   WBC 14.3 (A) 4.6 - 10.2  K/uL   Lymph, poc 2.2 0.6 - 3.4   POC LYMPH PERCENT 15.4 10 - 50 %L   MID (cbc) 0.3 0 - 0.9   POC MID % 1.9 0 - 12 %M   POC Granulocyte 11.8 (A) 2 - 6.9   Granulocyte percent 82.7 (A) 37 - 80 %G   RBC 4.71 4.04 - 5.48 M/uL   Hemoglobin 12.7 12.2 - 16.2 g/dL   HCT, POC 16.1 09.6 - 47.9 %   MCV 84.5 80 - 97 fL    MCH, POC 27.0 27 - 31.2 pg   MCHC 31.9 31.8 - 35.4 g/dL   RDW, POC 04.5 %   Platelet Count, POC 352 142 - 424 K/uL   MPV 7.3 0 - 99.8 fL    Assessment and Plan :   Diverticulitis of colon  Acute abdominal pain in left lower quadrant  Diarrhea, unspecified type - Plan: POCT urinalysis dipstick, POCT CBC, Basic metabolic panel, Stool culture  Generalized abdominal pain  Left lower quadrant pain - Plan: CT Abdomen Pelvis W Contrast  Case precepted with Dr. Neva Seat. Discussed risks of diverticulitis including bowel perforation, peritonitis, sepsis. Patient is not agreeing to present to the ER. I will have her start Augmentin. Will obtain stat CT abdomen and pelvis. ER precautions given for worsening symptoms. Patient verbalized understanding, agreed to start antibiotic immediately, will go to CT that we schedule. She also agreed to go to the ER if she gets worse. Patient is to follow up tomorrow in clinic for a recheck.  Wallis Bamberg, PA-C Primary [facility] at [facility] [phone] 12/02/2017  5:17 PM

## 2017-12-02 NOTE — Patient Instructions (Addendum)
Diverticulitis Diverticulitis is infection or inflammation of small pouches (diverticula) in the colon that form due to a condition called diverticulosis. Diverticula can trap stool (feces) and bacteria, causing infection and inflammation. Diverticulitis may cause severe stomach pain and diarrhea. It may lead to tissue damage in the colon that causes bleeding. The diverticula may also burst (rupture) and cause infected stool to enter other areas of the abdomen. Complications of diverticulitis can include:  Bleeding.  Severe infection.  Severe pain.  Rupture (perforation) of the colon.  Blockage (obstruction) of the colon.  What are the causes? This condition is caused by stool becoming trapped in the diverticula, which allows bacteria to grow in the diverticula. This leads to inflammation and infection. What increases the risk? You are more likely to develop this condition if:  You have diverticulosis. The risk for diverticulosis increases if: ? You are overweight or obese. ? You use tobacco products. ? You do not get enough exercise.  You eat a diet that does not include enough fiber. High-fiber foods include fruits, vegetables, beans, nuts, and whole grains.  What are the signs or symptoms? Symptoms of this condition may include:  Pain and tenderness in the abdomen. The pain is normally located on the left side of the abdomen, but it may occur in other areas.  Fever and chills.  Bloating.  Cramping.  Nausea.  Vomiting.  Changes in bowel routines.  Blood in your stool.  How is this diagnosed? This condition is diagnosed based on:  Your medical history.  A physical exam.  Tests to make sure there is nothing else causing your condition. These tests may include: ? Blood tests. ? Urine tests. ? Imaging tests of the abdomen, including X-rays, ultrasounds, MRIs, or CT scans.  How is this treated? Most cases of this condition are mild and can be treated at home.  Treatment may include:  Taking over-the-counter pain medicines.  Following a clear liquid diet.  Taking antibiotic medicines by mouth.  Rest.  More severe cases may need to be treated at a hospital. Treatment may include:  Not eating or drinking.  Taking prescription pain medicine.  Receiving antibiotic medicines through an IV tube.  Receiving fluids and nutrition through an IV tube.  Surgery.  When your condition is under control, your health care provider may recommend that you have a colonoscopy. This is an exam to look at the entire large intestine. During the exam, a lubricated, bendable tube is inserted into the anus and then passed into the rectum, colon, and other parts of the large intestine. A colonoscopy can show how severe your diverticula are and whether something else may be causing your symptoms. Follow these instructions at home: Medicines  Take over-the-counter and prescription medicines only as told by your health care provider. These include fiber supplements, probiotics, and stool softeners.  If you were prescribed an antibiotic medicine, take it as told by your health care provider. Do not stop taking the antibiotic even if you start to feel better.  Do not drive or use heavy machinery while taking prescription pain medicine. General instructions  Follow a full liquid diet or another diet as directed by your health care provider. After your symptoms improve, your health care provider may tell you to change your diet. He or she may recommend that you eat a diet that contains at least 25 g (25 grams) of fiber daily. Fiber makes it easier to pass stool. Healthy sources of fiber include: ? Berries. One cup   contains 4-8 grams of fiber. ? Beans or lentils. One half cup contains 5-8 grams of fiber. ? Green vegetables. One cup contains 4 grams of fiber.  Exercise for at least 30 minutes, 3 times each week. You should exercise hard enough to raise your heart rate and  break a sweat.  Keep all follow-up visits as told by your health care provider. This is important. You may need a colonoscopy. Contact a health care provider if:  Your pain does not improve.  You have a hard time drinking or eating food.  Your bowel movements do not return to normal. Get help right away if:  Your pain gets worse.  Your symptoms do not get better with treatment.  Your symptoms suddenly get worse.  You have a fever.  You vomit more than one time.  You have stools that are bloody, black, or tarry. Summary  Diverticulitis is infection or inflammation of small pouches (diverticula) in the colon that form due to a condition called diverticulosis. Diverticula can trap stool (feces) and bacteria, causing infection and inflammation.  You are at higher risk for this condition if you have diverticulosis and you eat a diet that does not include enough fiber.  Most cases of this condition are mild and can be treated at home. More severe cases may need to be treated at a hospital.  When your condition is under control, your health care provider may recommend that you have an exam called a colonoscopy. This exam can show how severe your diverticula are and whether something else may be causing your symptoms. This information is not intended to replace advice given to you by your health care provider. Make sure you discuss any questions you have with your health care provider. Document Released: 05/05/2005 Document Revised: 08/28/2016 Document Reviewed: 08/28/2016 Elsevier Interactive Patient Education  2018 [employer]     IF you received an x-ray today, you will receive an invoice from [facility]. Please contact [facility] at [phone] with questions or concerns regarding your invoice.   IF you received labwork today, you will receive an invoice from [facility]. Please contact [facility] at [phone] with questions or concerns regarding your  invoice.   Our billing staff will not be able to assist you with questions regarding bills from these companies.  You will be contacted with the lab results as soon as they are available. The fastest way to get your results is to activate your My Chart account. Instructions are located on the last page of this paperwork. If you have not heard from us regarding the results in 2 weeks, please contact this office.      

## 2017-12-03 ENCOUNTER — Ambulatory Visit (HOSPITAL_BASED_OUTPATIENT_CLINIC_OR_DEPARTMENT_OTHER)
Admission: RE | Admit: 2017-12-03 | Discharge: 2017-12-03 | Disposition: A | Discharge status: Home / Self Care | Payer: Federal, State, Local not specified - PPO | Source: Ambulatory Visit | Attending: Urgent Care | Admitting: Urgent Care

## 2017-12-03 ENCOUNTER — Encounter (HOSPITAL_BASED_OUTPATIENT_CLINIC_OR_DEPARTMENT_OTHER): Payer: Self-pay

## 2017-12-03 ENCOUNTER — Telehealth: Payer: Self-pay | Admitting: Radiology

## 2017-12-03 DIAGNOSIS — R1032 Left lower quadrant pain: Secondary | ICD-10-CM | POA: Diagnosis not present

## 2017-12-03 DIAGNOSIS — K5732 Diverticulitis of large intestine without perforation or abscess without bleeding: Secondary | ICD-10-CM | POA: Insufficient documentation

## 2017-12-03 LAB — BASIC METABOLIC PANEL
BUN / CREAT RATIO: 14 (ref 12–28)
BUN: 11 mg/dL (ref 8–27)
CALCIUM: 9.1 mg/dL (ref 8.7–10.3)
CO2: 23 mmol/L (ref 20–29)
Chloride: 104 mmol/L (ref 96–106)
Creatinine, Ser: 0.8 mg/dL (ref 0.57–1.00)
GFR, EST AFRICAN AMERICAN: 88 mL/min/{1.73_m2} (ref >59)
GFR, EST NON AFRICAN AMERICAN: 77 mL/min/{1.73_m2} (ref >59)
Glucose: 110 mg/dL — ABNORMAL HIGH (ref 65–99)
Potassium: 3.8 mmol/L (ref 3.5–5.2)
Sodium: 142 mmol/L (ref 134–144)

## 2017-12-03 MED ORDER — IOPAMIDOL (ISOVUE-300) INJECTION 61%
100.0000 mL | Freq: Once | INTRAVENOUS | Status: AC | PRN
  Administered 2017-12-03: 100 mL via INTRAVENOUS

## 2017-12-04 ENCOUNTER — Ambulatory Visit (HOSPITAL_COMMUNITY)
Admission: EM | Admit: 2017-12-04 | Discharge: 2017-12-04 | Disposition: A | Discharge status: Home / Self Care | Payer: Federal, State, Local not specified - PPO | Attending: Urgent Care | Admitting: Urgent Care

## 2017-12-04 ENCOUNTER — Other Ambulatory Visit: Payer: Self-pay

## 2017-12-04 ENCOUNTER — Encounter (HOSPITAL_COMMUNITY): Payer: Self-pay | Admitting: *Deleted

## 2017-12-04 DIAGNOSIS — K5732 Diverticulitis of large intestine without perforation or abscess without bleeding: Secondary | ICD-10-CM | POA: Insufficient documentation

## 2017-12-04 DIAGNOSIS — D72829 Elevated white blood cell count, unspecified: Secondary | ICD-10-CM

## 2017-12-04 DIAGNOSIS — R1032 Left lower quadrant pain: Secondary | ICD-10-CM

## 2017-12-04 HISTORY — DX: Diverticulitis of intestine, part unspecified, without perforation or abscess without bleeding: K57.92

## 2017-12-04 LAB — CBC
HEMATOCRIT: 40.9 % (ref 36.0–46.0)
HEMOGLOBIN: 12.9 g/dL (ref 12.0–15.0)
MCH: 27 pg (ref 26.0–34.0)
MCHC: 31.5 g/dL (ref 30.0–36.0)
MCV: 85.7 fL (ref 78.0–100.0)
Platelets: 370 10*3/uL (ref 150–400)
RBC: 4.77 MIL/uL (ref 3.87–5.11)
RDW: 13.7 % (ref 11.5–15.5)
WBC: 7.3 10*3/uL (ref 4.0–10.5)

## 2017-12-04 NOTE — ED Provider Notes (Signed)
MRN: [id number] DOB: Mar 26, 1951  Subjective:   Meghan Hickman is a 67 y.o. female presenting for recheck on diverticulitis.  Patient was seen by me a primary care [place] on 12/02/2017.  I started her on Augmentin for left lower quadrant pain.  Patient was refusing to go to the ER but agreed to get a CT scan, this was done on [id number].  CT confirmed that she had moderate diverticulitis.  Patient was instructed to come to the urgent care clinic today.  She reports improvement in her pain.  She is no longer having fever but still feels a little nausea, no more vomiting.  No current facility-administered medications for this encounter.   Current Outpatient Medications:  .  albuterol (PROVENTIL HFA;VENTOLIN HFA) 108 (90 Base) MCG/ACT inhaler, Inhale 2 puffs into the lungs every 6 (six) hours as needed for wheezing or shortness of breath., Disp: 1 Inhaler, Rfl: 0 .  almotriptan (AXERT) 12.5 MG tablet, Take 12.5 mg by mouth daily as needed for migraine. , Disp: , Rfl: 4 .  ALPRAZolam (XANAX) 1 MG tablet, Take 1-2 tablets thirty minutes prior to MRI.  May repeat with 1 additional tablet prior to entering scanner, if needed.  Must have driver., Disp: 3 tablet, Rfl: 0 .  amoxicillin-clavulanate (AUGMENTIN) 875-125 MG tablet, Take 1 tablet by mouth 2 (two) times daily., Disp: 20 tablet, Rfl: 0 .  aspirin 81 MG tablet, Take 81 mg by mouth daily., Disp: , Rfl:  .  atorvastatin (LIPITOR) 20 MG tablet, TAKE 1 TABLET BY MOUTH EVERY DAY AT 6 PM, Disp: 90 tablet, Rfl: 3 .  escitalopram (LEXAPRO) 20 MG tablet, Take 1 tablet (20 mg total) by mouth daily., Disp: 90 tablet, Rfl: 1 .  estradiol (ESTRACE) 0.5 MG tablet, Take 0.5 mg by mouth daily., Disp: , Rfl: 0 .  ipratropium (ATROVENT) 0.03 % nasal spray, Place 2 sprays into both nostrils 2 (two) times daily., Disp: 30 mL, Rfl: 0 .  losartan-hydrochlorothiazide (HYZAAR) 50-12.5 MG tablet, Take 1 tablet by mouth daily., Disp: 90 tablet, Rfl: 1 .  metFORMIN  (GLUCOPHAGE) 500 MG tablet, TAKE 1 TABLET BY MOUTH EVERY DAY *SCHEDULE APPOINTMENT FOR FOLLOW UP*, Disp: 90 tablet, Rfl: 0 .  Multiple Vitamins-Minerals (MULTIVITAMIN & MINERAL PO), Take 1 tablet by mouth daily., Disp: , Rfl:  .  ranitidine (ZANTAC) 150 MG tablet, Take 1 tablet (150 mg total) by mouth daily as needed., Disp: 90 tablet, Rfl: 3 .  temazepam (RESTORIL) 30 MG capsule, TAKE 1 CAPSULE BY MOUTH AT BEDTIME AS NEEDED FOR SLEEP, Disp: 30 capsule, Rfl: 0 .  traMADol (ULTRAM) 50 MG tablet, Take by mouth every 6 (six) hours as needed., Disp: , Rfl:    No Known Allergies  Past Medical History:  Diagnosis Date  . Arthritis   . Depression   . Diabetes mellitus without complication (HCC)   . Diverticulitis   . GERD (gastroesophageal reflux disease)   . Headache   . Heart murmur   . Hyperlipidemia   . Hypertension   . Speech problem   . Wears glasses      Past Surgical History:  Procedure Laterality Date  . CHOLECYSTECTOMY N/A 12/05/2014   Procedure: LAPAROSCOPIC CHOLECYSTECTOMY;  Surgeon: Emelia Loron, MD;  Location: [facility] OR;  Service: General;  Laterality: N/A;  . SHOULDER ARTHROSCOPY WITH SUBACROMIAL DECOMPRESSION AND BICEP TENDON REPAIR Right 04/23/2013   Procedure: SHOULDER ARTHROSCOPY WITH SUBACROMIAL DECOMPRESSION AND BICEP TENDON TENOTOMY;  Surgeon: Mable Paris, MD;  Location: [facility];  Service: Orthopedics;  Laterality: Right;    Objective:   Vitals: BP (!) 142/73   Pulse 91   Temp 98.4 F (36.9 C) (Oral)   Resp 18   SpO2 100%   Physical Exam  Constitutional: She is oriented to person, place, and time. She appears well-developed and well-nourished.  HENT:  Mouth/Throat: Oropharynx is clear and moist.  Eyes: No scleral icterus.  Cardiovascular: Normal rate, regular rhythm and intact distal pulses. Exam reveals no gallop and no friction rub.  No murmur heard. Pulmonary/Chest: No respiratory distress. She has no wheezes. She has no  rales.  Abdominal: Soft. Bowel sounds are normal. She exhibits no distension and no mass. There is tenderness. There is no rebound and no guarding.  Patient no longer has diffuse abdominal tenderness.  She does still have left lower quadrant tenderness.  Neurological: She is alert and oriented to person, place, and time.  Skin: Skin is warm and dry.  Psychiatric: She has a normal mood and affect.   Assessment and Plan :   Diverticulitis of colon  LLQ abdominal pain  Leukocytosis, unspecified type  Patient is improved.  Will have patient maintain her Augmentin.  Obtain CBC today, this lab is pending.  If she has worsening leukocytosis, I will notify patient and have her present to the ER.  She is to follow-up with me at primary care [place] on 12/09/2017.  ER precautions reviewed again with patient.     Wallis Bamberg, [place] 12/04/17 1636

## 2017-12-04 NOTE — ED Triage Notes (Signed)
Pt was told to return to [facility] today to see M. [place], [place].  Continues with abd pain.

## 2017-12-05 ENCOUNTER — Ambulatory Visit: Payer: Federal, State, Local not specified - PPO

## 2017-12-05 NOTE — Progress Notes (Signed)
Attempted to reach patient regarding normal results. No answer at this time. Voicemail left.

## 2017-12-08 ENCOUNTER — Ambulatory Visit: Payer: Federal, State, Local not specified - PPO | Attending: Neurology | Admitting: Speech Pathology

## 2017-12-08 ENCOUNTER — Encounter: Payer: Self-pay | Admitting: Speech Pathology

## 2017-12-08 NOTE — Therapy (Signed)
SPEECH THERAPY DISCHARGE SUMMARY  Visits from Start of Care: 7  Current functional level related to goals / functional outcomes: See goals below. In structured tasks and in conversation with SLP pt successful approx 95% of the time and speech was functional in controlled environment.   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 Pt states talking with strangers is still more difficult and not as successful.   Education / Equipment: Compensations for apraxia of speech.  Plan: Patient agrees to discharge.  Patient goals were partially met. Patient is being discharged due to not returning since the last visit.  ?????         SLP Short Term Goals - 12/08/17 0830      SLP SHORT TERM GOAL #1   Title  pt will demo HEP for apraxia with 70% slowed rate over two sessions    Status  Achieved      SLP SHORT TERM GOAL #2   Title  pt will demo HEP for apraxia with functional articulation 90% of the time over two sessions    Status  Deferred      SLP SHORT TERM GOAL #3   Title  pt will completed structured speech tasks at the sentence level with reduced speech rate 80% of the time over three sessions    Status  Achieved      SLP Long Term Goals - 12/08/17 0831      SLP LONG TERM GOAL #1   Title  pt will demo HEP for apraxia with 95% slowed rate over three sessions    Status  Partially Met      SLP LONG TERM GOAL #2   Title  pt will demo HEP for apraxia with functional articulation 90% of the time over five sessions    Status  Partially Met      SLP LONG TERM GOAL #3   Title  pt will demo 10 mintues functional simple to mod complex conversation with modified independence (using compensations) over three sessions    Status  Partially Met       Beth , MS, CCC-SLP Speech-Language Pathologist  [facility] [address] [place], [place], [address] Phone: [phone]   Fax:  [phone]  Patient Details  Name: Meghan Hickman MRN: [id number] Date of Birth: 10/11/1950 Referring Provider:  No ref. provider found  Encounter Date: 12/08/2017    E  12/08/2017, 8:31 AM  [facility] [address] [place], [place], [address] Phone: [phone]   Fax:  [phone] 

## 2017-12-09 ENCOUNTER — Ambulatory Visit: Payer: Federal, State, Local not specified - PPO | Admitting: Emergency Medicine

## 2017-12-09 ENCOUNTER — Other Ambulatory Visit: Payer: Self-pay

## 2017-12-09 ENCOUNTER — Encounter: Payer: Self-pay | Admitting: Emergency Medicine

## 2017-12-09 VITALS — BP 118/82 | HR 94 | Temp 98.2°F | Resp 16 | Ht 58.25 in | Wt 152.6 lb

## 2017-12-09 DIAGNOSIS — K5732 Diverticulitis of large intestine without perforation or abscess without bleeding: Secondary | ICD-10-CM | POA: Diagnosis not present

## 2017-12-09 DIAGNOSIS — R590 Localized enlarged lymph nodes: Secondary | ICD-10-CM

## 2017-12-09 NOTE — Progress Notes (Signed)
Meghan Hickman 67 y.o.   Chief Complaint  Patient presents with  . Diverticulitis    follow up  . Mass    below neck - left 12/08/17    HISTORY OF PRESENT ILLNESS: This is a 67 y.o. female here for follow-up of diverticulitis.  Diagnosis made on 12/02/2017 and started on antibiotics.  Seen again at the urgent care center 12/04/2017.  Feeling better.  Abdominal pain gone.  Able to eat and drink.  Denies nausea or vomiting.  Denies fever or chills.  Denies rectal bleeding. Also complaining of a small lump on left side of the lower neck she felt several days ago.  Nontender.  HPI   Prior to Admission medications   Medication Sig Start Date End Date Taking? Authorizing Provider  albuterol (PROVENTIL HFA;VENTOLIN HFA) 108 (90 Base) MCG/ACT inhaler Inhale 2 puffs into the lungs every 6 (six) hours as needed for wheezing or shortness of breath. 08/19/16  Yes Ofilia Neas, PA-C  almotriptan (AXERT) 12.5 MG tablet Take 12.5 mg by mouth daily as needed for migraine.  09/23/14  Yes [provider]  ALPRAZolam Prudy Feeler) 1 MG tablet Take 1-2 tablets thirty minutes prior to MRI.  May repeat with 1 additional tablet prior to entering scanner, if needed.  Must have driver. 09/19/17  Yes Levert Feinstein, MD  amoxicillin-clavulanate (AUGMENTIN) 875-125 MG tablet Take 1 tablet by mouth 2 (two) times daily. 12/02/17  Yes Wallis Bamberg, PA-C  aspirin 81 MG tablet Take 81 mg by mouth daily.   Yes [provider]  atorvastatin (LIPITOR) 20 MG tablet TAKE 1 TABLET BY MOUTH EVERY DAY AT 6 PM 06/02/17  Yes Stallings, Zoe A, MD  escitalopram (LEXAPRO) 20 MG tablet Take 1 tablet (20 mg total) by mouth daily. 06/02/17  Yes Stallings, Zoe A, MD  ipratropium (ATROVENT) 0.03 % nasal spray Place 2 sprays into both nostrils 2 (two) times daily. 06/23/16  Yes Bing Neighbors, FNP  losartan-hydrochlorothiazide (HYZAAR) 50-12.5 MG tablet Take 1 tablet by mouth daily. 06/02/17  Yes Stallings, Zoe A, MD  metFORMIN  (GLUCOPHAGE) 500 MG tablet TAKE 1 TABLET BY MOUTH EVERY DAY *SCHEDULE APPOINTMENT FOR FOLLOW UP* 06/06/17  Yes Stallings, Zoe A, MD  Multiple Vitamins-Minerals (MULTIVITAMIN & MINERAL PO) Take 1 tablet by mouth daily.   Yes [provider]  ranitidine (ZANTAC) 150 MG tablet Take 1 tablet (150 mg total) by mouth daily as needed. 06/02/17  Yes Stallings, Zoe A, MD  temazepam (RESTORIL) 30 MG capsule TAKE 1 CAPSULE BY MOUTH AT BEDTIME AS NEEDED FOR SLEEP 05/10/16  Yes Jeffery, Chelle, PA-C  traMADol (ULTRAM) 50 MG tablet Take by mouth every 6 (six) hours as needed.   Yes [provider]  estradiol (ESTRACE) 0.5 MG tablet Take 0.5 mg by mouth daily. 11/12/15   [provider]    No Known Allergies  Patient Active Problem List   Diagnosis Date Noted  . Expressive aphasia 09/15/2017  . Migraine 10/13/2016  . Insomnia 10/13/2016  . Essential hypertension 10/13/2016  . Moderate episode of recurrent major depressive disorder (HCC) 05/12/2016  . Small vessel disease, cerebrovascular 12/22/2015  . Slurred speech 07/24/2015  . Abnormal CT of brain 07/08/2015  . Speech abnormality 06/20/2015  . Thyroid nodule 06/20/2015  . S/P laparoscopic cholecystectomy 12/05/2014  . Heart murmur 02/11/2013  . Type 2 diabetes mellitus, controlled (HCC) 02/09/2013  . Other and unspecified hyperlipidemia 02/09/2013    Past Medical History:  Diagnosis Date  . Arthritis   .  Depression   . Diabetes mellitus without complication (HCC)   . Diverticulitis   . GERD (gastroesophageal reflux disease)   . Headache   . Heart murmur   . Hyperlipidemia   . Hypertension   . Speech problem   . Wears glasses     Past Surgical History:  Procedure Laterality Date  . CHOLECYSTECTOMY N/A 12/05/2014   Procedure: LAPAROSCOPIC CHOLECYSTECTOMY;  Surgeon: Emelia Loron, MD;  Location[facility] OR;  Service: General;  Laterality: N/A;  . SHOULDER ARTHROSCOPY WITH SUBACROMIAL DECOMPRESSION AND BICEP  TENDON REPAIR Right 04/23/2013   Procedure: SHOULDER ARTHROSCOPY WITH SUBACROMIAL DECOMPRESSION AND BICEP TENDON TENOTOMY;  Surgeon: Mable Paris, MD;  Location: [facility];  Service: Orthopedics;  Laterality: Right;    Social History   Socioeconomic History  . Marital status: Single    Spouse name: Not on file  . Number of children: 0  . Years of education: 49  . Highest education level: Not on file  Occupational History  . Occupation: Research scientist (physical sciences)  Social Needs  . Financial resource strain: Not on file  . Food insecurity:    Worry: Not on file    Inability: Not on file  . Transportation needs:    Medical: Not on file    Non-medical: Not on file  Tobacco Use  . Smoking status: Former Smoker    Packs/day: 0.50    Years: 20.00    Pack years: 10.00    Types: Cigarettes    Start date: 04/30/2012    Last attempt to quit: 08/16/2012    Years since quitting: 5.3  . Smokeless tobacco: Never Used  Substance and Sexual Activity  . Alcohol use: No    Alcohol/week: 0.0 oz  . Drug use: No  . Sexual activity: Never    Comment: was smoking 1/2 pppd  Lifestyle  . Physical activity:    Days per week: Not on file    Minutes per session: Not on file  . Stress: Not on file  Relationships  . Social connections:    Talks on phone: Not on file    Gets together: Not on file    Attends religious service: Not on file    Active member of club or organization: Not on file    Attends meetings of clubs or organizations: Not on file    Relationship status: Not on file  . Intimate partner violence:    Fear of current or ex partner: Not on file    Emotionally abused: Not on file    Physically abused: Not on file    Forced sexual activity: Not on file  Other Topics Concern  . Not on file  Social History Narrative   Lives at home alone.   Right-handed.   2 cups caffeine per day.    Family History  Problem Relation Age of Onset  . Stroke Mother   . Hypertension  Mother   . Diabetes Mother   . Breast cancer Mother   . Healthy Father   . Heart disease Brother      Review of Systems  Constitutional: Negative.  Negative for chills and fever.  HENT: Negative.   Eyes: Negative.   Respiratory: Negative.  Negative for cough and shortness of breath.   Cardiovascular: Negative.  Negative for chest pain and palpitations.  Gastrointestinal: Negative.  Negative for abdominal pain, blood in stool, diarrhea, melena, nausea and vomiting.  Genitourinary: Negative.  Negative for dysuria and hematuria.  Skin: Negative.  Negative  for rash.  Neurological: Negative.  Negative for dizziness and headaches.  Endo/Heme/Allergies: Negative.   All other systems reviewed and are negative.  Vitals:   12/09/17 1637  BP: 118/82  Pulse: 94  Resp: 16  Temp: 98.2 F (36.8 C)  SpO2: 97%    Physical Exam  Constitutional: She is oriented to person, place, and time. She appears well-developed and well-nourished.  HENT:  Head: Normocephalic.  Mouth/Throat: Oropharynx is clear and moist.  Eyes: Pupils are equal, round, and reactive to light.  Neck: Normal range of motion.  Cardiovascular: Normal rate, regular rhythm and normal heart sounds.  Pulmonary/Chest: Effort normal and breath sounds normal.  Abdominal: Soft. She exhibits no distension. There is no tenderness.  Lymphadenopathy:       Left: Supraclavicular adenopathy present.  Neurological: She is alert and oriented to person, place, and time.  Skin: Skin is warm and dry. Capillary refill takes less than 2 seconds. No rash noted.  Psychiatric: She has a normal mood and affect. Her behavior is normal.  Vitals reviewed.   A total of 25 minutes was spent in the room with the patient, greater than 50% of which was in counseling/coordination of care regarding treatment of diverticulitis, medications, and need for follow-up.  ASSESSMENT & PLAN: Meghan Hickman was seen today for diverticulitis and mass.  Diagnoses and all  orders for this visit:  Diverticulitis of colon Comments: improved Orders: -     Ambulatory referral to Gastroenterology  Supraclavicular adenopathy Comments: r/o Virchow's node   Needs follow-up with GI for colonoscopy to rule out intestinal malignancy.   Patient Instructions       IF you received an x-ray today, you will receive an invoice from [facility]. Please contact [facility] at [phone] with questions or concerns regarding your invoice.   IF you received labwork today, you will receive an invoice from [facility]. Please contact [facility] at [phone] with questions or concerns regarding your invoice.   Our billing staff will not be able to assist you with questions regarding bills from these companies.  You will be contacted with the lab results as soon as they are available. The fastest way to get your results is to activate your My Chart account. Instructions are located on the last page of this paperwork. If you have not heard from [place] regarding the results in 2 weeks, please contact this office.     Diverticulitis Diverticulitis is when small pockets in your large intestine (colon) get infected or swollen. This causes stomach pain and watery poop (diarrhea). These pouches are called diverticula. They form in people who have a condition called diverticulosis. Follow these instructions at home: Medicines  Take over-the-counter and prescription medicines only as told by your doctor. These include: ? Antibiotics. ? Pain medicines. ? Fiber pills. ? Probiotics. ? Stool softeners.  Do not drive or use heavy machinery while taking prescription pain medicine.  If you were prescribed an antibiotic, take it as told. Do not stop taking it even if you feel better. General instructions  Follow a diet as told by your doctor.  When you feel better, your doctor may tell you to change your diet. You may need to eat a lot of fiber. Fiber makes it  easier to poop (have bowel movements). Healthy foods with fiber include: ? Berries. ? Beans. ? Lentils. ? Green vegetables.  Exercise 3 or more times a week. Aim for 30 minutes each time. Exercise enough to sweat and make your heart beat  faster.  Keep all follow-up visits as told. This is important. You may need to have an exam of the large intestine. This is called a colonoscopy. Contact a doctor if:  Your pain does not get better.  You have a hard time eating or drinking.  You are not pooping like normal. Get help right away if:  Your pain gets worse.  Your problems do not get better.  Your problems get worse very fast.  You have a fever.  You throw up (vomit) more than one time.  You have poop that is: ? Bloody. ? Black. ? Tarry. Summary  Diverticulitis is when small pockets in your large intestine (colon) get infected or swollen.  Take medicines only as told by your doctor.  Follow a diet as told by your doctor. This information is not intended to replace advice given to you by your health care provider. Make sure you discuss any questions you have with your health care provider. Document Released: 01/12/2008 Document Revised: 08/12/2016 Document Reviewed: 08/12/2016 Elsevier Interactive Patient Education  2017 Elsevier Inc.    Edwina Barth, MD [facility]

## 2017-12-09 NOTE — Patient Instructions (Addendum)
     IF you received an x-ray today, you will receive an invoice from [facility]. Please contact [facility] at [phone] with questions or concerns regarding your invoice.   IF you received labwork today, you will receive an invoice from [facility]. Please contact [facility] at [phone] with questions or concerns regarding your invoice.   Our billing staff will not be able to assist you with questions regarding bills from these companies.  You will be contacted with the lab results as soon as they are available. The fastest way to get your results is to activate your My Chart account. Instructions are located on the last page of this paperwork. If you have not heard from us regarding the results in 2 weeks, please contact this office.     Diverticulitis Diverticulitis is when small pockets in your large intestine (colon) get infected or swollen. This causes stomach pain and watery poop (diarrhea). These pouches are called diverticula. They form in people who have a condition called diverticulosis. Follow these instructions at home: Medicines  Take over-the-counter and prescription medicines only as told by your doctor. These include: ? Antibiotics. ? Pain medicines. ? Fiber pills. ? Probiotics. ? Stool softeners.  Do not drive or use heavy machinery while taking prescription pain medicine.  If you were prescribed an antibiotic, take it as told. Do not stop taking it even if you feel better. General instructions  Follow a diet as told by your doctor.  When you feel better, your doctor may tell you to change your diet. You may need to eat a lot of fiber. Fiber makes it easier to poop (have bowel movements). Healthy foods with fiber include: ? Berries. ? Beans. ? Lentils. ? Green vegetables.  Exercise 3 or more times a week. Aim for 30 minutes each time. Exercise enough to sweat and make your heart beat faster.  Keep all follow-up visits as told. This is  important. You may need to have an exam of the large intestine. This is called a colonoscopy. Contact a doctor if:  Your pain does not get better.  You have a hard time eating or drinking.  You are not pooping like normal. Get help right away if:  Your pain gets worse.  Your problems do not get better.  Your problems get worse very fast.  You have a fever.  You throw up (vomit) more than one time.  You have poop that is: ? Bloody. ? Black. ? Tarry. Summary  Diverticulitis is when small pockets in your large intestine (colon) get infected or swollen.  Take medicines only as told by your doctor.  Follow a diet as told by your doctor. This information is not intended to replace advice given to you by your health care provider. Make sure you discuss any questions you have with your health care provider. Document Released: 01/12/2008 Document Revised: 08/12/2016 Document Reviewed: 08/12/2016 Elsevier Interactive Patient Education  2017 Elsevier Inc.  

## 2017-12-20 ENCOUNTER — Ambulatory Visit: Payer: Federal, State, Local not specified - PPO | Admitting: Neurology

## 2017-12-22 ENCOUNTER — Encounter: Payer: Self-pay | Admitting: Speech Pathology

## 2017-12-28 ENCOUNTER — Encounter: Payer: Self-pay | Admitting: Family Medicine

## 2017-12-28 ENCOUNTER — Ambulatory Visit: Payer: Federal, State, Local not specified - PPO | Admitting: Family Medicine

## 2017-12-28 VITALS — BP 116/70 | HR 105 | Temp 98.9°F | Resp 17 | Ht 58.5 in | Wt 150.2 lb

## 2017-12-28 DIAGNOSIS — R590 Localized enlarged lymph nodes: Secondary | ICD-10-CM

## 2017-12-28 DIAGNOSIS — K5732 Diverticulitis of large intestine without perforation or abscess without bleeding: Secondary | ICD-10-CM

## 2017-12-28 DIAGNOSIS — E1121 Type 2 diabetes mellitus with diabetic nephropathy: Secondary | ICD-10-CM | POA: Diagnosis not present

## 2017-12-28 LAB — POCT GLYCOSYLATED HEMOGLOBIN (HGB A1C): HEMOGLOBIN A1C: 6.4 % — AB (ref 4.0–5.6)

## 2017-12-28 MED ORDER — TRAMADOL HCL 50 MG PO TABS: 50.0000 mg | ORAL_TABLET | Freq: Four times a day (QID) | ORAL | 0 refills | Status: DC | PRN

## 2017-12-28 NOTE — Patient Instructions (Addendum)
[facility] [address] [place], [place]   [place]: [phone] Fax: [phone]    Ask for an appointment for diverticulitis and colonoscopy  IF you received an x-ray today, you will receive an invoice from [facility]. Please contact [facility] at [phone] with questions or concerns regarding your invoice.   IF you received labwork today, you will receive an invoice from [facility]. Please contact [facility] at [phone] with questions or concerns regarding your invoice.   Our billing staff will not be able to assist you with questions regarding bills from these companies.  You will be contacted with the lab results as soon as they are available. The fastest way to get your results is to activate your My Chart account. Instructions are located on the last page of this paperwork. If you have not heard from [place] regarding the results in 2 weeks, please contact this office.

## 2017-12-28 NOTE — Progress Notes (Signed)
Chief Complaint  Patient presents with  . diabetes check    HPI  Diabetes Pt reports that she has some low readings once a week She states that she takes metformin occasionally She had an episode of diverticulitis in April and made some dietary changes She no longer eats any beef She added [place] and fish  Lab Results  Component Value Date   HGBA1C 6.0 06/02/2017   Diverticulitis She avoids red meat She is eat more [place] and fish She is eating fruits and vegetables She is back to a normal diet She denies blood per rectum  Supraclavicular lymph node Reports that she her left side lymph node is still present for more than a month She was referred to GI but did not understand the call from referrals She has no fevers, chills Wt Readings from Last 3 Encounters:  12/28/17 150 lb 3.2 oz (68.1 kg)  12/09/17 152 lb 9.6 oz (69.2 kg)  12/02/17 154 lb (69.9 kg)    She is losing weight but she thinks it is because of the episode of diverticulosis  Lab Results  Component Value Date   WBC 7.3 12/04/2017   HGB 12.9 12/04/2017   HCT 40.9 12/04/2017   MCV 85.7 12/04/2017   PLT 370 12/04/2017     Past Medical History:  Diagnosis Date  . Arthritis   . Depression   . Diabetes mellitus without complication (HCC)   . Diverticulitis   . GERD (gastroesophageal reflux disease)   . Headache   . Heart murmur   . Hyperlipidemia   . Hypertension   . Speech problem   . Wears glasses     Current Outpatient Medications  Medication Sig Dispense Refill  . albuterol (PROVENTIL HFA;VENTOLIN HFA) 108 (90 Base) MCG/ACT inhaler Inhale 2 puffs into the lungs every 6 (six) hours as needed for wheezing or shortness of breath. 1 Inhaler 0  . almotriptan (AXERT) 12.5 MG tablet Take 12.5 mg by mouth daily as needed for migraine.   4  . ALPRAZolam (XANAX) 1 MG tablet Take 1-2 tablets thirty minutes prior to MRI.  May repeat with 1 additional tablet prior to entering scanner, if needed.  Must  have driver. 3 tablet 0  . amoxicillin-clavulanate (AUGMENTIN) 875-125 MG tablet Take 1 tablet by mouth 2 (two) times daily. 20 tablet 0  . aspirin 81 MG tablet Take 81 mg by mouth daily.    [name] Kitchen atorvastatin (LIPITOR) 20 MG tablet TAKE 1 TABLET BY MOUTH EVERY DAY AT 6 PM 90 tablet 3  . escitalopram (LEXAPRO) 20 MG tablet Take 1 tablet (20 mg total) by mouth daily. 90 tablet 1  . estradiol (ESTRACE) 0.5 MG tablet Take 0.5 mg by mouth daily.  0  . ipratropium (ATROVENT) 0.03 % nasal spray Place 2 sprays into both nostrils 2 (two) times daily. 30 mL 0  . losartan-hydrochlorothiazide (HYZAAR) 50-12.5 MG tablet Take 1 tablet by mouth daily. 90 tablet 1  . Multiple Vitamins-Minerals (MULTIVITAMIN & MINERAL PO) Take 1 tablet by mouth daily.    . ranitidine (ZANTAC) 150 MG tablet Take 1 tablet (150 mg total) by mouth daily as needed. 90 tablet 3  . temazepam (RESTORIL) 30 MG capsule TAKE 1 CAPSULE BY MOUTH AT BEDTIME AS NEEDED FOR SLEEP 30 capsule 0  . traMADol (ULTRAM) 50 MG tablet Take 1 tablet (50 mg total) by mouth every 6 (six) hours as needed. 30 tablet 0   No current facility-administered medications for this visit.  Allergies: No Known Allergies  Past Surgical History:  Procedure Laterality Date  . CHOLECYSTECTOMY N/A 12/05/2014   Procedure: LAPAROSCOPIC CHOLECYSTECTOMY;  Surgeon: Emelia Loron, MD;  Location: [facility] OR;  Service: General;  Laterality: N/A;  . SHOULDER ARTHROSCOPY WITH SUBACROMIAL DECOMPRESSION AND BICEP TENDON REPAIR Right 04/23/2013   Procedure: SHOULDER ARTHROSCOPY WITH SUBACROMIAL DECOMPRESSION AND BICEP TENDON TENOTOMY;  Surgeon: Mable Paris, MD;  Location:  SURGERY CENTER;  Service: Orthopedics;  Laterality: Right;    Social History   Socioeconomic History  . Marital status: Single    Spouse name: Not on file  . Number of children: 0  . Years of education: 32  . Highest education level: Not on file  Occupational History  . Occupation:  Research scientist (physical sciences)  Social Needs  . Financial resource strain: Not on file  . Food insecurity:    Worry: Not on file    Inability: Not on file  . Transportation needs:    Medical: Not on file    Non-medical: Not on file  Tobacco Use  . Smoking status: Former Smoker    Packs/day: 0.50    Years: 20.00    Pack years: 10.00    Types: Cigarettes    Start date: 04/30/2012    Last attempt to quit: 08/16/2012    Years since quitting: 5.3  . Smokeless tobacco: Never Used  Substance and Sexual Activity  . Alcohol use: No    Alcohol/week: 0.0 oz  . Drug use: No  . Sexual activity: Never    Comment: was smoking 1/2 pppd  Lifestyle  . Physical activity:    Days per week: Not on file    Minutes per session: Not on file  . Stress: Not on file  Relationships  . Social connections:    Talks on phone: Not on file    Gets together: Not on file    Attends religious service: Not on file    Active member of club or organization: Not on file    Attends meetings of clubs or organizations: Not on file    Relationship status: Not on file  Other Topics Concern  . Not on file  Social History Narrative   Lives at home alone.   Right-handed.   2 cups caffeine per day.    Family History  Problem Relation Age of Onset  . Stroke Mother   . Hypertension Mother   . Diabetes Mother   . Breast cancer Mother   . Healthy Father   . Heart disease Brother      ROS Review of Systems See HPI Constitution: No fevers or chills No malaise No diaphoresis Skin: No rash or itching Eyes: no blurry vision, no double vision GU: no dysuria or hematuria Neuro: no dizziness or headaches  all others reviewed and negative   Objective: Vitals:   12/28/17 1656  BP: 116/70  Pulse: (!) 105  Resp: 17  Temp: 98.9 F (37.2 C)  TempSrc: Oral  SpO2: 95%  Weight: 150 lb 3.2 oz (68.1 kg)  Height: 4' 10.5" (1.486 m)    Physical Exam  Constitutional: She is oriented to person, place, and time. She appears  well-developed and well-nourished.  HENT:  Head: Normocephalic and atraumatic.  Eyes: Conjunctivae and EOM are normal.  Neck: Normal range of motion. Neck supple.  Cardiovascular: Normal rate, regular rhythm and normal heart sounds.  No murmur heard. Pulmonary/Chest: Effort normal and breath sounds normal. No stridor. No respiratory distress. She has no wheezes.  Abdominal: Soft. Bowel sounds are normal.  Mild tenderness on the right with some tenderness on the left side. No rebound or guarding.   Lymphadenopathy:       Left: Supraclavicular adenopathy present.  Neurological: She is alert and oriented to person, place, and time.  Skin: Skin is warm. Capillary refill takes less than 2 seconds.  Psychiatric: She has a normal mood and affect. Her behavior is normal. Judgment and thought content normal.    Assessment and Plan Meghan Hickman was seen today for diabetes check.  Diagnoses and all orders for this visit:  Controlled type 2 diabetes mellitus with diabetic nephropathy, without long-term current use of insulin (HCC) -     POCT glycosylated hemoglobin (Hb A1C) -     HM Diabetes Foot Exam stop metformin  Monitor diet   Diverticulitis  Supraclavicular lymph node Follow up with GI for supraclavicular lymph node as well as diverticulitis Would like evaluation with colonoscopy  Other orders -     traMADol (ULTRAM) 50 MG tablet; Take 1 tablet (50 mg total) by mouth every 6 (six) hours as needed.  Roman Sandall A Ella Guillotte

## 2017-12-29 ENCOUNTER — Ambulatory Visit (INDEPENDENT_AMBULATORY_CARE_PROVIDER_SITE_OTHER): Payer: Federal, State, Local not specified - PPO | Admitting: Neurology

## 2017-12-29 DIAGNOSIS — E669 Obesity, unspecified: Secondary | ICD-10-CM

## 2017-12-29 DIAGNOSIS — G4733 Obstructive sleep apnea (adult) (pediatric): Secondary | ICD-10-CM

## 2017-12-29 DIAGNOSIS — R351 Nocturia: Secondary | ICD-10-CM

## 2017-12-29 DIAGNOSIS — G4720 Circadian rhythm sleep disorder, unspecified type: Secondary | ICD-10-CM

## 2018-01-03 ENCOUNTER — Telehealth: Payer: Self-pay

## 2018-01-03 NOTE — Procedures (Signed)
PATIENT'S NAME:  Meghan Hickman, Meghan Hickman DOB:      1951/05/22      MR#:    [id number]     DATE OF RECORDING: 12/29/2017 REFERRING M.D.:  Collie Siad, MD Study Performed:   CPAP  Titration HISTORY:  67 year old woman with a history of hypertension, hyperlipidemia, diabetes, history of slurring of speech, reflux disease, arthritis, and borderline obesity, who presents for a CPAP titration study during the day. She had a daytime sleep study with [place] on 11/08/17 with a total AHI of 31.1/hour, O2 nadir of 73%. BMI of 30.3 kg/m2, neck size of 15 inches.  CURRENT MEDICATIONS: Albuterol inhaler, Axert, Xanax, Lipitor, Lexapro, Estrace, Neurontin, Atrovert, Hyzaar, Glucophage, Multivitamins, Zantac, Restoril, Ultram  PROCEDURE:  This is a multichannel digital polysomnogram utilizing the SomnoStar 11.2 system.  Electrodes and sensors were applied and monitored per AASM Specifications.   EEG, EOG, Chin and Limb EMG, were sampled at 200 Hz.  ECG, Snore and Nasal Pressure, Thermal Airflow, Respiratory Effort, CPAP Flow and Pressure, Oximetry was sampled at 50 Hz. Digital video and audio were recorded.      The patient was fitted with small P10 nasal pillows. CPAP was initiated at 5 cmH20 with heated humidity per AASM split night standards and pressure was advanced to 8 cmH20 because of hypopneas, apneas and desaturations.  At a PAP pressure of 8 cmH20, there was a reduction of the AHI to 5.2/hour with supine NREM sleep achieved and O2 nadir 88%.   Lights Out was at 08:43 and Lights On at 15:01. Total recording time (TRT) was 378 minutes, with a total sleep time (TST) of 249.5 minutes. The patient's sleep latency was 71.5 minutes, which is delayed. REM latency was 194.5 minutes, which is delayed. The sleep efficiency was 66%.    SLEEP ARCHITECTURE: WASO (Wake after sleep onset) was 60.5 minutes.  There were 11 minutes in Stage N1, 122 minutes Stage N2, 79 minutes Stage N3 and 37.5 minutes in Stage REM.  The percentage of  Stage N1 was 4.4%, Stage N2 was 48.9%, Stage N3 was 31.7% and Stage R (REM sleep) was 15%.   RESPIRATORY ANALYSIS:  There was a total of 27 respiratory events: 7 obstructive apneas, 0 central apneas and 0 mixed apneas with a total of 7 apneas and an apnea index (AI) of 1.7 /hour. There were 20 hypopneas with a hypopnea index of 4.8/hour. The patient also had 0 respiratory event related arousals (RERAs). The arousals were noted as: 48 were spontaneous, 0 were associated with PLMs, 8 were associated with respiratory events.     The total APNEA/HYPOPNEA INDEX  (AHI) was 6.5/hour and the total RESPIRATORY DISTURBANCE INDEX was 6.5 0./hour  25 events occurred in REM sleep and 2 events in NREM. The REM AHI was 40/hour versus a non-REM AHI of .6 0./hour.  The patient spent 125 minutes of total sleep time in the supine position and 125 minutes in non-supine. The supine AHI was 0.0, versus a non-supine AHI of 13.0.  OXYGEN SATURATION & C02:  The baseline 02 saturation was 94%, with the lowest being 80%. Time spent below 89% saturation equaled 23 minutes.  PERIODIC LIMB MOVEMENTS:  The patient had a total of 12 Periodic Limb Movements. The Periodic Limb Movement (PLM) index was 2.9 and the PLM Arousal index was 0/hour.  Audio and video analysis did not show any abnormal or unusual movements, behaviors, phonations or vocalizations. The patient took 1 bathroom break. EKG was in keeping with normal sinus  rhythm (NSR).  Post-study, the patient indicated that sleep was the same as usual.   IMPRESSION:   1. Obstructive Sleep Apnea (OSA) 2. Dysfunctions associated with sleep stages or arousal from sleep   RECOMMENDATIONS:   1. This study significant reduction of her obstructive sleep apnea with CPAP therapy. I will recommend a home CPAP pressure of 9 cm via small nasal pillows with heated humidity. The patient should be reminded to be fully compliant with PAP therapy to improve sleep related symptoms and  decrease long term cardiovascular risks. The patient should be reminded, that it may take up to 3 months to get fully used to using PAP with all planned sleep. The earlier full compliance is achieved, the better long term compliance tends to be. Please note that untreated obstructive sleep apnea may carry additional perioperative morbidity. Patients with significant obstructive sleep apnea should receive perioperative PAP therapy and the surgeons and particularly the anesthesiologist should be informed of the diagnosis and the severity of the sleep disordered breathing. 2. This study shows sleep fragmentation and abnormal sleep stage percentages; these are nonspecific findings and per se do not signify an intrinsic sleep disorder or a cause for the patient's sleep-related symptoms. Causes include (but are not limited to) the first night effect of the sleep study, circadian rhythm disturbances, medication effect or an underlying mood disorder or medical problem.  3. The patient should be cautioned not to drive, work at heights, or operate dangerous or heavy equipment when tired or sleepy. Review and reiteration of good sleep hygiene measures should be pursued with any patient. 4. The patient will be seen in follow-up by Dr. Frances Furbish at [facility] for discussion of the test results and further management strategies. The referring provider will be notified of the test results.   I certify that I have reviewed the entire raw data recording prior to the issuance of this report in accordance with the Standards of Accreditation of the American Academy of Sleep Medicine (AASM)     Huston Foley, MD, PhD Diplomat, American Board of Psychiatry and Neurology (Neurology and Sleep Medicine)

## 2018-01-03 NOTE — Telephone Encounter (Signed)
-----   Message from Huston Foley, MD sent at 01/03/2018  8:00 AM EDT ----- Patient referred by Dr. Terrace Arabia, seen by me on 10/10/17, diagnostic PSG on 11/08/17. Patient had a CPAP titration study on 12/29/17.  Please call and inform patient that I have entered an order for treatment with positive airway pressure (PAP) treatment for obstructive sleep apnea (OSA). She did well during the latest sleep study with CPAP. We will, therefore, arrange for a machine for home use through a DME (durable medical equipment) company of Her choice; and I will see the patient back in follow-up in about 10 weeks. Please also explain to the patient that I will be looking out for compliance data, which can be downloaded from the machine (stored on an SD card, that is inserted in the machine) or via remote access through a modem, that is built into the machine. At the time of the followup appointment we will discuss sleep study results and how it is going with PAP treatment at home. Please advise patient to bring Her machine at the time of the first FU visit, even though this is cumbersome. Bringing the machine for every visit after that will likely not be needed, but often helps for the first visit to troubleshoot if needed. Please re-enforce the importance of compliance with treatment and the need for [place] to monitor compliance data - often an insurance requirement and actually good feedback for the patient as far as how they are doing.  Also remind patient, that any interim PAP machine or mask issues should be first addressed with the DME company, as they can often help better with technical and mask fit issues. Please ask if patient has a preference regarding DME company.  Please also make sure, the patient has a follow-up appointment with me in about 10 weeks from the setup date, thanks. May see one of our nurse practitioners if needed for proper timing of the FU appointment.  Please fax or rout report to the referring provider. Thanks,    Huston Foley, MD, PhD [facility])

## 2018-01-03 NOTE — Progress Notes (Signed)
Patient referred by Dr. Terrace Arabia, seen by me on 10/10/17, diagnostic PSG on 11/08/17. Patient had a CPAP titration study on 12/29/17.  Please call and inform patient that I have entered an order for treatment with positive airway pressure (PAP) treatment for obstructive sleep apnea (OSA). She did well during the latest sleep study with CPAP. We will, therefore, arrange for a machine for home use through a DME (durable medical equipment) company of Her choice; and I will see the patient back in follow-up in about 10 weeks. Please also explain to the patient that I will be looking out for compliance data, which can be downloaded from the machine (stored on an SD card, that is inserted in the machine) or via remote access through a modem, that is built into the machine. At the time of the followup appointment we will discuss sleep study results and how it is going with PAP treatment at home. Please advise patient to bring Her machine at the time of the first FU visit, even though this is cumbersome. Bringing the machine for every visit after that will likely not be needed, but often helps for the first visit to troubleshoot if needed. Please re-enforce the importance of compliance with treatment and the need for [place] to monitor compliance data - often an insurance requirement and actually good feedback for the patient as far as how they are doing.  Also remind patient, that any interim PAP machine or mask issues should be first addressed with the DME company, as they can often help better with technical and mask fit issues. Please ask if patient has a preference regarding DME company.  Please also make sure, the patient has a follow-up appointment with me in about 10 weeks from the setup date, thanks. May see one of our nurse practitioners if needed for proper timing of the FU appointment.  Please fax or rout report to the referring provider. Thanks,   Huston Foley, MD, PhD [facility])

## 2018-01-03 NOTE — Telephone Encounter (Signed)
I called pt to discuss her sleep study results. No answer, left a message asking her to call me back. 

## 2018-01-03 NOTE — Addendum Note (Signed)
Addended by: Huston Foley on: 01/03/2018 08:00 AM   Modules accepted: Orders

## 2018-01-04 NOTE — Telephone Encounter (Signed)
Pt has returned the call to RN Kristen.  Please call °

## 2018-01-04 NOTE — Telephone Encounter (Signed)
I called pt. I advised pt that Dr. Frances Furbish reviewed their sleep study results and found that pt did well with the cpap. Dr. Frances Furbish recommends that pt start a cpap at home. I reviewed PAP compliance expectations with the pt. Pt is agreeable to starting a CPAP. I advised pt that an order will be sent to a DME, [facility], and [facility] will call the pt within about one week after they file with the pt's insurance. [facility] will show the pt how to use the machine, fit for masks, and troubleshoot the CPAP if needed. A follow up appt was made for insurance purposes with Dr. Frances Furbish on 03/21/18 at 2:00pm Pt verbalized understanding to arrive 15 minutes early and bring their CPAP. A letter with all of this information in it will be mailed to the pt as a reminder. I verified with the pt that the address we have on file is correct. Pt verbalized understanding of results. Pt had no questions at this time but was encouraged to call back if questions arise.

## 2018-01-10 DIAGNOSIS — G4733 Obstructive sleep apnea (adult) (pediatric): Secondary | ICD-10-CM | POA: Diagnosis not present

## 2018-01-17 DIAGNOSIS — G43719 Chronic migraine without aura, intractable, without status migrainosus: Secondary | ICD-10-CM | POA: Diagnosis not present

## 2018-02-06 ENCOUNTER — Ambulatory Visit: Payer: Federal, State, Local not specified - PPO | Admitting: Neurology

## 2018-02-06 ENCOUNTER — Encounter

## 2018-02-06 ENCOUNTER — Encounter: Payer: Self-pay | Admitting: Neurology

## 2018-02-06 ENCOUNTER — Telehealth: Payer: Self-pay | Admitting: *Deleted

## 2018-02-06 NOTE — Telephone Encounter (Signed)
No showed follow up appointment. 

## 2018-02-09 DIAGNOSIS — G4733 Obstructive sleep apnea (adult) (pediatric): Secondary | ICD-10-CM | POA: Diagnosis not present

## 2018-02-14 ENCOUNTER — Encounter: Payer: Self-pay | Admitting: Neurology

## 2018-02-15 DIAGNOSIS — G3101 Pick's disease: Secondary | ICD-10-CM | POA: Diagnosis not present

## 2018-02-15 DIAGNOSIS — E538 Deficiency of other specified B group vitamins: Secondary | ICD-10-CM | POA: Diagnosis not present

## 2018-02-15 DIAGNOSIS — F0280 Dementia in other diseases classified elsewhere without behavioral disturbance: Secondary | ICD-10-CM | POA: Diagnosis not present

## 2018-02-15 DIAGNOSIS — E519 Thiamine deficiency, unspecified: Secondary | ICD-10-CM | POA: Diagnosis not present

## 2018-02-20 IMAGING — CR DG ABDOMEN ACUTE W/ 1V CHEST
3 series · 3 of 3 positions shown · non-contrast
Comparison: Chest radiograph on 11/19/2009

CLINICAL DATA: Generalized abdominal pain. Nausea without vomiting.

EXAM:
DG ABDOMEN ACUTE W/ 1V CHEST

[PA]
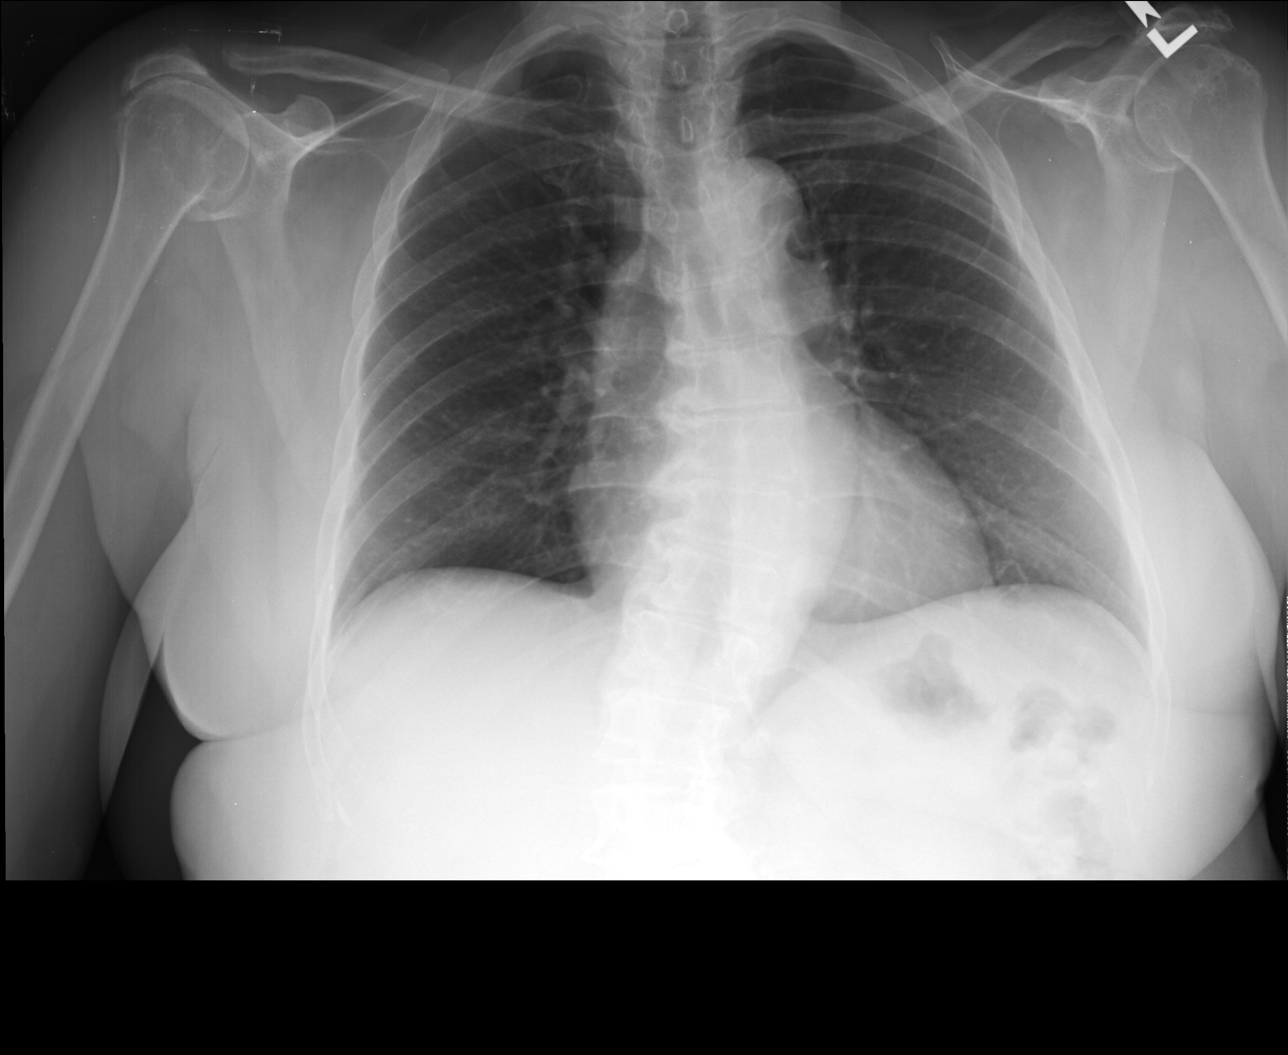

[AP (1 of 2)]
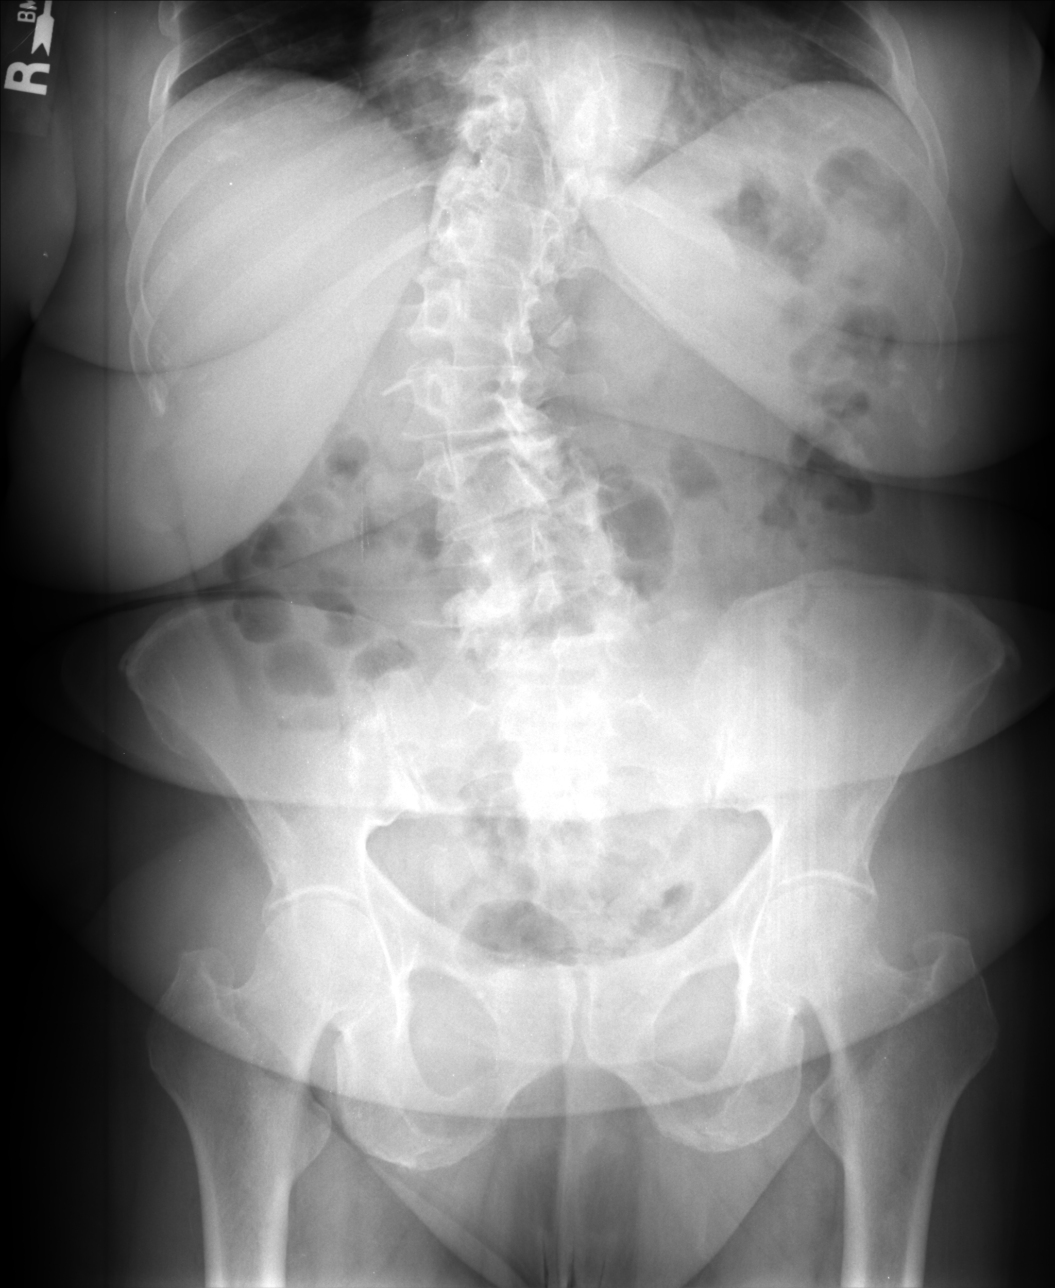

[AP (2 of 2)]
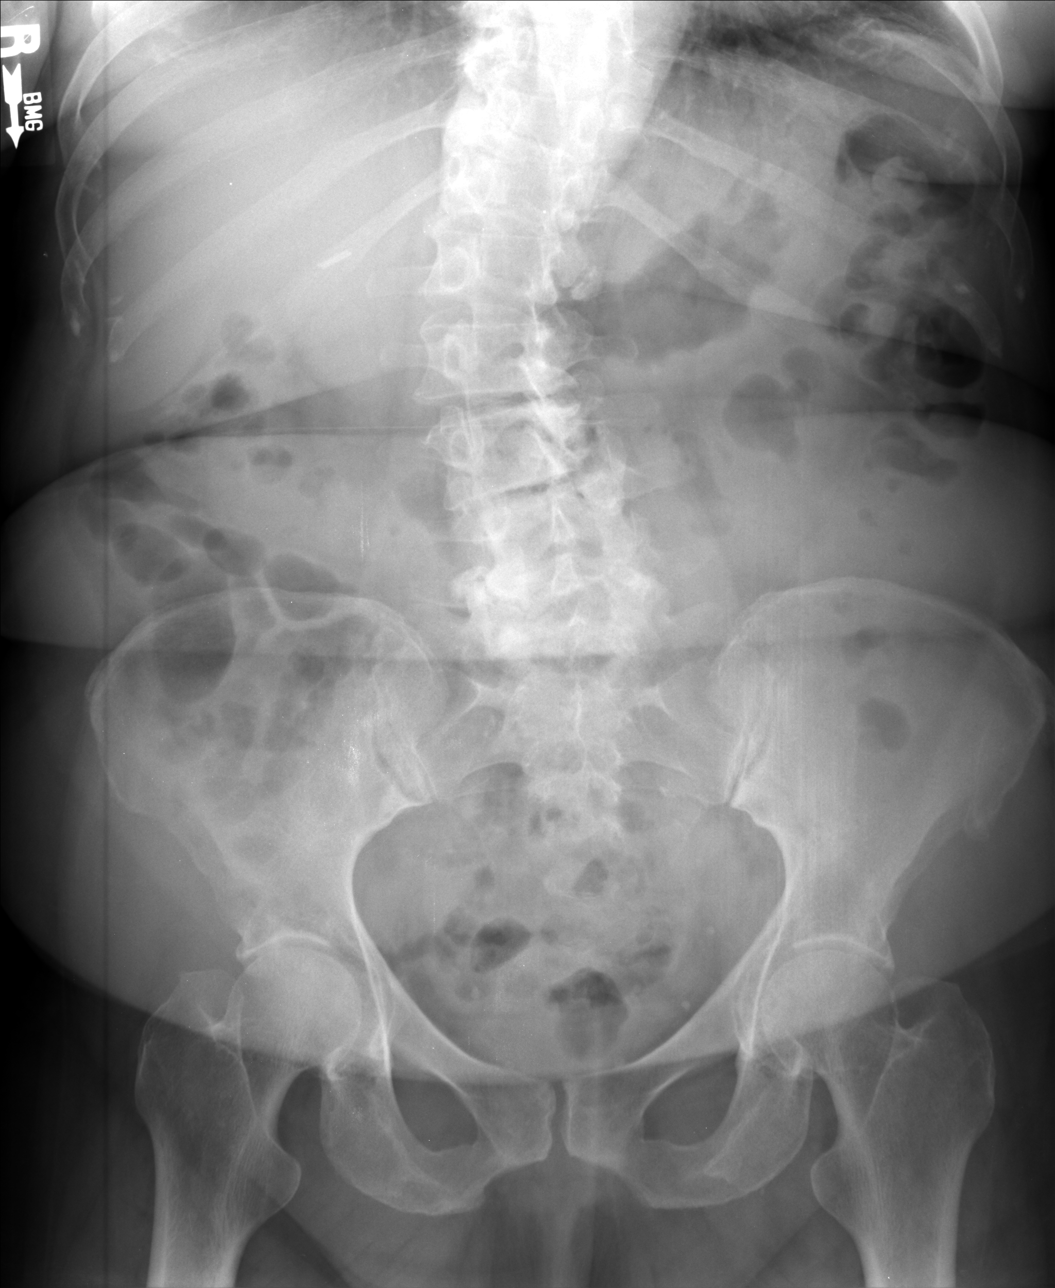

[3 of 3 positions shown; findings below may reference images not displayed]

FINDINGS: There is no evidence of dilated bowel loops or free intraperitoneal
air. Several pelvic phleboliths noted but no definite radiopaque
calculi identified. Right upper quadrant surgical clips from prior
cholecystectomy. S shaped thoracolumbar spine scoliosis and
degenerative changes noted.

Heart size and mediastinal contours are within normal limits. Both
lungs are clear. No evidence of pneumothorax or pleural effusion.
IMPRESSION: Unremarkable bowel gas pattern.  No acute findings.

No active cardiopulmonary disease.

Thoracolumbar scoliosis and degenerative changes.

## 2018-03-01 DIAGNOSIS — E119 Type 2 diabetes mellitus without complications: Secondary | ICD-10-CM | POA: Diagnosis not present

## 2018-03-01 DIAGNOSIS — Z961 Presence of intraocular lens: Secondary | ICD-10-CM | POA: Diagnosis not present

## 2018-03-01 DIAGNOSIS — H35372 Puckering of macula, left eye: Secondary | ICD-10-CM | POA: Diagnosis not present

## 2018-03-01 LAB — HM DIABETES EYE EXAM

## 2018-03-12 DIAGNOSIS — G4733 Obstructive sleep apnea (adult) (pediatric): Secondary | ICD-10-CM | POA: Diagnosis not present

## 2018-03-18 ENCOUNTER — Other Ambulatory Visit: Payer: Self-pay | Admitting: Family Medicine

## 2018-03-18 DIAGNOSIS — F331 Major depressive disorder, recurrent, moderate: Secondary | ICD-10-CM

## 2018-03-19 ENCOUNTER — Encounter: Payer: Self-pay | Admitting: Neurology

## 2018-03-20 NOTE — Telephone Encounter (Signed)
  Last Refill:06/02/17 #90 refill 1 Last OV: 12/28/17 PCP: Creta LevinStallings Pharmacy:[facility]

## 2018-03-21 ENCOUNTER — Ambulatory Visit: Payer: Federal, State, Local not specified - PPO | Admitting: Neurology

## 2018-03-21 ENCOUNTER — Encounter: Payer: Self-pay | Admitting: Neurology

## 2018-03-21 VITALS — BP 108/80 | HR 96 | Ht 60.0 in | Wt 153.0 lb

## 2018-03-21 DIAGNOSIS — Z9989 Dependence on other enabling machines and devices: Secondary | ICD-10-CM

## 2018-03-21 DIAGNOSIS — G4733 Obstructive sleep apnea (adult) (pediatric): Secondary | ICD-10-CM | POA: Diagnosis not present

## 2018-03-21 NOTE — Patient Instructions (Addendum)
Please continue using your CPAP regularly. While your insurance requires that you use CPAP at least 4 hours each night on 70% of the nights, I recommend, that you not skip any nights and use it throughout the night if you can. Getting used to CPAP and staying with the treatment long term does take time and patience and discipline. Untreated obstructive sleep apnea when it is moderate to severe can have an adverse impact on cardiovascular health and raise her risk for heart disease, arrhythmias, hypertension, congestive heart failure, stroke and diabetes. Untreated obstructive sleep apnea causes sleep disruption, nonrestorative sleep, and sleep deprivation. This can have an impact on your day to day functioning and cause daytime sleepiness and impairment of cognitive function, memory loss, mood disturbance, and problems focussing. Using CPAP regularly can improve these symptoms.  Please reschedule your appointment with Dr. Terrace ArabiaYan, as you missed an appointment in July.   We will see you back in 6 months for sleep apnea check up, you can see one of our nurse practitioners for sleep apnea follow up.   I have done a referral for oral appliance consultation with your dentist, you may or may not be an appropriate candidate for an oral appliance.

## 2018-03-21 NOTE — Progress Notes (Signed)
Subjective:    Patient ID: Meghan Hickman is a 67 y.o. female.  HPI     Interim history:   Meghan Hickman is a 67 year old right-handed woman with an underlying medical history of hypertension, hyperlipidemia, diabetes, history of slurring of speech, reflux disease, arthritis, and borderline obesity, who presents for follow-up consultation of her obstructive sleep apnea, after sleep study testing and starting CPAP therapy. The patient is unaccompanied today. I first met her on 10/10/2017 at the request of Dr. Krista Blue, at which time Meghan Hickman reported snoring and daytime somnolence as well as witnessed apneas. Meghan Hickman was advised to proceed with sleep study testing. Meghan Hickman had a baseline sleep study, followed by a CPAP titration study. I went over her test results with her in detail today. Her baseline sleep study from 11/08/2017 showed a sleep efficiency of 55.5%, sleep latency 27.5 minutes, REM latency of 113 minutes. Meghan Hickman had a reduced percentage of REM sleep. Total AHI was 31.1 per hour, REM AHI 75.7 per hour, supine AHI was 45.9 per hour. Average oxygen saturation was 95%, nadir was 73%. Meghan Hickman had no significant PLMS. Based on her test results he was advised to proceed with a full night CPAP titration study. Meghan Hickman had this on 12/29/2017. Sleep efficiency was 66%, sleep latency 71.5 minutes, REM latency was 194.5 minutes. Meghan Hickman was fitted with nasal pillows and titrated from 5 cm to 8 cm. On the final pressure her AHI was borderline at 5.2 per hour with supine non-REM sleep achieved an O2 nadir of 88%. Meghan Hickman had no significant PLMS. Based on her test results I suggested a home treatment pressure with CPAP of 9 cm via nasal pillows.  Today, 03/21/2018: I reviewed her CPAP compliance data from 02/18/2018 through 03/19/2018 which is a total of 30 days, during which time Meghan Hickman used her CPAP only 23 days with percent used days greater than 4 hours at 43%, indicating suboptimal compliance with an average usage of 4 hours and 35  minutes for days on treatment. Residual AHI 3.3 per hour, leak on the low side with the 95th percentile at 6.6 L/m on a pressure of 9 cm with EPR of 3. Her compliance for 4 or more hours was 77% during 01/16/2018 through 02/14/2018 with an average usage of 5 hours and 38 minutes at the time. Meghan Hickman reports that Meghan Hickman sleeps during the day and sometimes forgets to put it on, may doze off on the sofa. Meghan Hickman had initially noted some improvement in her initial feeling, more rested first thing. Tries to go to bed around 8 AM. Meghan Hickman would like to get a consultation for an oral appliance. Meghan Hickman does not necessarily have any telltale improvements of her sleep. Meghan Hickman would be willing to try to be more consistent with treatment. In the past month her average usage has gone down by one hour. Meghan Hickman missed an appointment with Dr. Krista Blue in July and is encouraged to reschedule that.   Previously:  10/10/2017: (Meghan Hickman) reports snoring and excessive daytime somnolence, as well as witnessed apneas per friend, who also reports that her snoring is loud. I reviewed your office note from 09/15/2017. Meghan Hickman reports having had a sleep study many years ago which showed borderline findings. Her Epworth sleepiness score is 7 out of 24 today, fatigue score is 48 out of 63. Meghan Hickman is single and lives alone, Meghan Hickman works for the [employer]. Meghan Hickman has no children. Meghan Hickman quit smoking in 2001, drinks alcohol very rarely, maybe twice a year or so, drinks caffeine,  about 2 servings per day, coffee and soda. Meghan Hickman works nights, typically from 7 PM to 3:30 AM. Bedtime is around 8 AM and rise time around 5 PM. Meghan Hickman is not aware of any family history of OSA. Meghan Hickman has nocturia about 3 times per average sleep schedule. Meghan Hickman would prefer a daytime sleep study. Meghan Hickman is off on Mondays and Tuesdays but tries to maintain her sleep schedule. Meghan Hickman had a recent brain MRI on 09/20/2017 which showed no acute findings.  Her Past Medical History Is Significant For: Past Medical History:  Diagnosis  Date  . Arthritis   . Depression   . Diabetes mellitus without complication ([place])   . Diverticulitis   . GERD (gastroesophageal reflux disease)   . Headache   . Heart murmur   . Hyperlipidemia   . Hypertension   . Speech problem   . Wears glasses     Her Past Surgical History Is Significant For: Past Surgical History:  Procedure Laterality Date  . CHOLECYSTECTOMY N/A 12/05/2014   Procedure: LAPAROSCOPIC CHOLECYSTECTOMY;  Surgeon: Rolm Bookbinder, MD;  Location: [place];  Service: General;  Laterality: N/A;  . SHOULDER ARTHROSCOPY WITH SUBACROMIAL DECOMPRESSION AND BICEP TENDON REPAIR Right 04/23/2013   Procedure: SHOULDER ARTHROSCOPY WITH SUBACROMIAL DECOMPRESSION AND BICEP TENDON TENOTOMY;  Surgeon: Nita Sells, MD;  Location: [place];  Service: Orthopedics;  Laterality: Right;    Her Family History Is Significant For: Family History  Problem Relation Age of Onset  . Stroke Mother   . Hypertension Mother   . Diabetes Mother   . Breast cancer Mother   . Healthy Father   . Heart disease Brother     Her Social History Is Significant For: Social History   Socioeconomic History  . Marital status: Single    Spouse name: Not on file  . Number of children: 0  . Years of education: 98  . Highest education level: Not on file  Occupational History  . Occupation: Scientist, research (medical)  Social Needs  . Financial resource strain: Not on file  . Food insecurity:    Worry: Not on file    Inability: Not on file  . Transportation needs:    Medical: Not on file    Non-medical: Not on file  Tobacco Use  . Smoking status: Former Smoker    Packs/day: 0.50    Years: 20.00    Pack years: 10.00    Types: Cigarettes    Start date: 04/30/2012    Last attempt to quit: 08/16/2012    Years since quitting: 5.5  . Smokeless tobacco: Never Used  Substance and Sexual Activity  . Alcohol use: No    Alcohol/week: 0.0 standard drinks  . Drug use: No  . Sexual activity:  Never    Comment: was smoking 1/2 pppd  Lifestyle  . Physical activity:    Days per week: Not on file    Minutes per session: Not on file  . Stress: Not on file  Relationships  . Social connections:    Talks on phone: Not on file    Gets together: Not on file    Attends religious service: Not on file    Active member of club or organization: Not on file    Attends meetings of clubs or organizations: Not on file    Relationship status: Not on file  Other Topics Concern  . Not on file  Social History Narrative   Lives at home alone.   Right-handed.  2 cups caffeine per day.    Her Allergies Are:  No Known Allergies:   Her Current Medications Are:  Outpatient Encounter Medications as of 03/21/2018  Medication Sig  . albuterol (PROVENTIL HFA;VENTOLIN HFA) 108 (90 Base) MCG/ACT inhaler Inhale 2 puffs into the lungs every 6 (six) hours as needed for wheezing or shortness of breath.  [name] Kitchen almotriptan (AXERT) 12.5 MG tablet Take 12.5 mg by mouth daily as needed for migraine.   . ALPRAZolam (XANAX) 1 MG tablet Take 1-2 tablets thirty minutes prior to MRI.  May repeat with 1 additional tablet prior to entering scanner, if needed.  Must have driver.  [name] Kitchen amoxicillin-clavulanate (AUGMENTIN) 875-125 MG tablet Take 1 tablet by mouth 2 (two) times daily.  [name] Kitchen aspirin 81 MG tablet Take 81 mg by mouth daily.  [name] Kitchen atorvastatin (LIPITOR) 20 MG tablet TAKE 1 TABLET BY MOUTH EVERY DAY AT 6 PM  . escitalopram (LEXAPRO) 20 MG tablet TAKE 1 TABLET BY MOUTH EVERY DAY  . estradiol (ESTRACE) 0.5 MG tablet Take 0.5 mg by mouth daily.  [name] Kitchen ipratropium (ATROVENT) 0.03 % nasal spray Place 2 sprays into both nostrils 2 (two) times daily.  [name] Kitchen losartan-hydrochlorothiazide (HYZAAR) 50-12.5 MG tablet Take 1 tablet by mouth daily.  . Multiple Vitamins-Minerals (MULTIVITAMIN & MINERAL PO) Take 1 tablet by mouth daily.  . ranitidine (ZANTAC) 150 MG tablet Take 1 tablet (150 mg total) by mouth daily as needed.  . temazepam  (RESTORIL) 30 MG capsule TAKE 1 CAPSULE BY MOUTH AT BEDTIME AS NEEDED FOR SLEEP  . traMADol (ULTRAM) 50 MG tablet Take 1 tablet (50 mg total) by mouth every 6 (six) hours as needed.   No facility-administered encounter medications on file as of 03/21/2018.   :  Review of Systems:  Out of a complete 14 point review of systems, all are reviewed and negative with the exception of these symptoms as listed below: Review of Systems  Neurological:       Pt presents today to discuss her cpap. Pt reports that Meghan Hickman does not feel any better.    Objective:  Neurological Exam  Physical Exam Physical Examination:   Vitals:   03/21/18 1401  BP: 108/80  Pulse: 96    General Examination: The patient is a very pleasant 67 y.o. female in no acute distress. Meghan Hickman appears well-developed and well-nourished and well groomed.   HEENT: Normocephalic, atraumatic, pupils are equal, round and reactive to light and accommodation. Corrective eyeglasses in place. Extraocular tracking is good without limitation to gaze excursion or nystagmus noted. Normal smooth pursuit is noted. Hearing is grossly intact. Face is symmetric with normal facial animation and normal facial sensation. Speech is slow and with pauses at times, no scanning, seems stable from last time. There is no hypophonia. There is no lip, neck/head, jaw or voice tremor. Neck with FROM. Oropharynx exam reveals: mild mouth dryness, adequate dental hygiene and moderate airway crowding, due to smaller airway entry, redundant soft palate, tonsils in place which are on the small side. Mallampati is class II. Neck circumference is 15 inches. Meghan Hickman has a minimal to absent overbite, some missing teeth, partial plate for the front teeth on top.   Chest: Clear to auscultation without wheezing, rhonchi or crackles noted.  Heart: S1+S2+0, regular and normal without murmurs, rubs or gallops noted.   Abdomen: Soft, non-tender and non-distended with normal bowel sounds  appreciated on auscultation.  Extremities: There is no pitting edema in the distal lower extremities bilaterally.   Skin: Warm  and dry without trophic changes noted.  Musculoskeletal: exam reveals no obvious joint deformities, tenderness or joint swelling or erythema.   Neurologically:  Mental status: The patient is awake, alert and oriented in all 4 spheres. Her immediate and remote memory, attention, language skills and fund of knowledge are appropriate. Speech as above. Thought process is linear. Mood is normal and affect is normal.  Cranial nerves II - XII are as described above under HEENT exam. Motor exam: Normal bulk, strength and tone is noted. There is no drift, tremor or rebound. Romberg is negative. Reflexes are 1+ throughout. Fine motor skills and coordination: grossly intact.  Cerebellar testing: No dysmetria or intention tremor. There is no truncal or gait ataxia.  Sensory exam: intact to light touch in the upper and lower extremities.  Gait, station and balance: Meghan Hickman stands easily. No veering to one side is noted. No leaning to one side is noted. Posture is age-appropriate and stance is narrow based. Gait shows normal stride length and normal pace. No problems turning are noted.   Assessment and Plan:   In summary, ALANIE SYLER is a very pleasant 67 year old female with an underlying medical history of hypertension, hyperlipidemia, diabetes, history of slurring of speech, reflux disease, arthritis, and borderline obesity, who presents for follow-up consultation of her obstructive sleep apnea which was determined to be in the severe range based on her baseline sleep study from 11/08/2017. Meghan Hickman will return for a CPAP titration study in May 2019. Meghan Hickman has established treatment with CPAP with some variable compliance. Meghan Hickman was initially compliant with treatment for a compliance percentage of 77% in the month of early June through early July. Meghan Hickman has had more lapses and treatment in  the past 30 days. Meghan Hickman is encouraged to be fully compliant with treatment. We again discussed the treatment options for sleep apnea. Meghan Hickman may not be a good candidate for an oral appliance. Meghan Hickman does not report intolerance to CPAP but does admit that Meghan Hickman sometimes falls asleep on the couch after coming home from work. Meghan Hickman works nights. Meghan Hickman sleeps during the day. Both sleep studies were done during the day to accommodate her schedule. I again explained to the patient the risks and ramifications of untreated moderate to severe obstructive sleep apnea. Meghan Hickman is encouraged to be consistent with her CPAP usage. Meghan Hickman is requesting a referral to dentistry, Meghan Hickman would like to see her own dentist for consultation for an oral appliance. I placed a referral for this to Dr. Orland Mustard, [place]. I asked her to reschedule her missed appointment with Dr. Krista Blue. From a sleep apnea standpoint Meghan Hickman is advised to follow-up in 6 months, Meghan Hickman can see one of our nurse practitioners.  I answered all her questions today and Meghan Hickman was in agreement. I spent 30 minutes in total face-to-face time with the patient, more than 50% of which was spent in counseling and coordination of care, reviewing test results, reviewing medication and discussing or reviewing the diagnosis of OSA, its prognosis and treatment options. Pertinent laboratory and imaging test results that were available during this visit with the patient were reviewed by me and considered in my medical decision making (see chart for details).

## 2018-03-28 DIAGNOSIS — K219 Gastro-esophageal reflux disease without esophagitis: Secondary | ICD-10-CM | POA: Diagnosis not present

## 2018-03-28 DIAGNOSIS — R159 Full incontinence of feces: Secondary | ICD-10-CM | POA: Diagnosis not present

## 2018-03-28 DIAGNOSIS — K5790 Diverticulosis of intestine, part unspecified, without perforation or abscess without bleeding: Secondary | ICD-10-CM | POA: Diagnosis not present

## 2018-04-12 DIAGNOSIS — G4733 Obstructive sleep apnea (adult) (pediatric): Secondary | ICD-10-CM | POA: Diagnosis not present

## 2018-04-26 DIAGNOSIS — K648 Other hemorrhoids: Secondary | ICD-10-CM | POA: Diagnosis not present

## 2018-04-26 DIAGNOSIS — R159 Full incontinence of feces: Secondary | ICD-10-CM | POA: Diagnosis not present

## 2018-04-26 DIAGNOSIS — K219 Gastro-esophageal reflux disease without esophagitis: Secondary | ICD-10-CM | POA: Diagnosis not present

## 2018-05-10 DIAGNOSIS — K648 Other hemorrhoids: Secondary | ICD-10-CM | POA: Diagnosis not present

## 2018-05-10 DIAGNOSIS — K219 Gastro-esophageal reflux disease without esophagitis: Secondary | ICD-10-CM | POA: Diagnosis not present

## 2018-05-15 ENCOUNTER — Ambulatory Visit: Payer: Federal, State, Local not specified - PPO | Admitting: Family Medicine

## 2018-05-18 NOTE — Progress Notes (Deleted)
No chief complaint on file.   HPI  4 review of systems  Past Medical History:  Diagnosis Date  . Arthritis   . Depression   . Diabetes mellitus without complication ([facility])   . Diverticulitis   . GERD (gastroesophageal reflux disease)   . Headache   . Heart murmur   . Hyperlipidemia   . Hypertension   . Speech problem   . Wears glasses     Current Outpatient Medications  Medication Sig Dispense Refill  . albuterol (PROVENTIL HFA;VENTOLIN HFA) 108 (90 Base) MCG/ACT inhaler Inhale 2 puffs into the lungs every 6 (six) hours as needed for wheezing or shortness of breath. 1 Inhaler 0  . almotriptan (AXERT) 12.5 MG tablet Take 12.5 mg by mouth daily as needed for migraine.   4  . ALPRAZolam (XANAX) 1 MG tablet Take 1-2 tablets thirty minutes prior to MRI.  May repeat with 1 additional tablet prior to entering scanner, if needed.  Must have driver. 3 tablet 0  . amoxicillin-clavulanate (AUGMENTIN) 875-125 MG tablet Take 1 tablet by mouth 2 (two) times daily. 20 tablet 0  . aspirin 81 MG tablet Take 81 mg by mouth daily.    [name] Kitchen atorvastatin (LIPITOR) 20 MG tablet TAKE 1 TABLET BY MOUTH EVERY DAY AT 6 PM 90 tablet 3  . escitalopram (LEXAPRO) 20 MG tablet TAKE 1 TABLET BY MOUTH EVERY DAY 90 tablet 1  . estradiol (ESTRACE) 0.5 MG tablet Take 0.5 mg by mouth daily.  0  . ipratropium (ATROVENT) 0.03 % nasal spray Place 2 sprays into both nostrils 2 (two) times daily. 30 mL 0  . losartan-hydrochlorothiazide (HYZAAR) 50-12.5 MG tablet Take 1 tablet by mouth daily. 90 tablet 1  . Multiple Vitamins-Minerals (MULTIVITAMIN & MINERAL PO) Take 1 tablet by mouth daily.    . ranitidine (ZANTAC) 150 MG tablet Take 1 tablet (150 mg total) by mouth daily as needed. 90 tablet 3  . temazepam (RESTORIL) 30 MG capsule TAKE 1 CAPSULE BY MOUTH AT BEDTIME AS NEEDED FOR SLEEP 30 capsule 0  . traMADol (ULTRAM) 50 MG tablet Take 1 tablet (50 mg total) by mouth every 6 (six) hours as needed. 30 tablet 0   No current  facility-administered medications for this visit.     Allergies: No Known Allergies  Past Surgical History:  Procedure Laterality Date  . CHOLECYSTECTOMY N/A 12/05/2014   Procedure: LAPAROSCOPIC CHOLECYSTECTOMY;  Surgeon: Emelia Loron, MD;  Location: [facility];  Service: General;  Laterality: N/A;  . SHOULDER ARTHROSCOPY WITH SUBACROMIAL DECOMPRESSION AND BICEP TENDON REPAIR Right 04/23/2013   Procedure: SHOULDER ARTHROSCOPY WITH SUBACROMIAL DECOMPRESSION AND BICEP TENDON TENOTOMY;  Surgeon: Mable Paris, MD;  Location: [facility];  Service: Orthopedics;  Laterality: Right;    Social History   Socioeconomic History  . Marital status: Single    Spouse name: Not on file  . Number of children: 0  . Years of education: 43  . Highest education level: Not on file  Occupational History  . Occupation: Research scientist (physical sciences)  Social Needs  . Financial resource strain: Not on file  . Food insecurity:    Worry: Not on file    Inability: Not on file  . Transportation needs:    Medical: Not on file    Non-medical: Not on file  Tobacco Use  . Smoking status: Former Smoker    Packs/day: 0.50    Years: 20.00    Pack years: 10.00    Types: Cigarettes  Start date: 04/30/2012    Last attempt to quit: 08/16/2012    Years since quitting: 5.7  . Smokeless tobacco: Never Used  Substance and Sexual Activity  . Alcohol use: No    Alcohol/week: 0.0 standard drinks  . Drug use: No  . Sexual activity: Never    Comment: was smoking 1/2 pppd  Lifestyle  . Physical activity:    Days per week: Not on file    Minutes per session: Not on file  . Stress: Not on file  Relationships  . Social connections:    Talks on phone: Not on file    Gets together: Not on file    Attends religious service: Not on file    Active member of club or organization: Not on file    Attends meetings of clubs or organizations: Not on file    Relationship status: Not on file  Other Topics Concern  .  Not on file  Social History Narrative   Lives at home alone.   Right-handed.   2 cups caffeine per day.    Family History  Problem Relation Age of Onset  . Stroke Mother   . Hypertension Mother   . Diabetes Mother   . Breast cancer Mother   . Healthy Father   . Heart disease Brother      ROS Review of Systems See HPI Constitution: No fevers or chills No malaise No diaphoresis Skin: No rash or itching Eyes: no blurry vision, no double vision GU: no dysuria or hematuria Neuro: no dizziness or headaches * all others reviewed and negative   Objective: There were no vitals filed for this visit.  Physical Exam  Assessment and Plan There are no diagnoses linked to this encounter.   Densel Kronick P [employer]

## 2018-05-19 ENCOUNTER — Ambulatory Visit: Payer: Federal, State, Local not specified - PPO | Admitting: Family Medicine

## 2018-05-25 ENCOUNTER — Other Ambulatory Visit: Payer: Self-pay

## 2018-05-25 ENCOUNTER — Ambulatory Visit: Payer: Federal, State, Local not specified - PPO | Admitting: Neurology

## 2018-05-25 ENCOUNTER — Encounter: Payer: Self-pay | Admitting: Neurology

## 2018-05-25 VITALS — BP 126/79 | HR 87 | Resp 18 | Ht 60.0 in | Wt 152.0 lb

## 2018-05-25 DIAGNOSIS — G3101 Pick's disease: Secondary | ICD-10-CM

## 2018-05-25 DIAGNOSIS — I679 Cerebrovascular disease, unspecified: Secondary | ICD-10-CM

## 2018-05-25 DIAGNOSIS — F0280 Dementia in other diseases classified elsewhere without behavioral disturbance: Secondary | ICD-10-CM | POA: Diagnosis not present

## 2018-05-25 MED ORDER — DONEPEZIL HCL 10 MG PO TABS: 10.0000 mg | ORAL_TABLET | Freq: Every day | ORAL | 5 refills | Status: DC

## 2018-05-25 NOTE — Progress Notes (Signed)
Chief Complaint  Patient presents with  . Expressive Aphasia    Rm. 4.  Sts. speech disturbance is no better. Sts. was seen at [facility], dx.with primary progressive aphasia and told there really is not anything they can do to improve or resolve it.  She was started on Donepezil, believes she is taking 5mg  and tolerating it well/fim   Chief Complaint  Patient presents with  . Expressive Aphasia    Rm. 4.  Sts. speech disturbance is no better. Sts. was seen at [facility], dx.with primary progressive aphasia and told there really is not anything they can do to improve or resolve it.  She was started on Donepezil, believes she is taking 5mg  and tolerating it well/fim      PATIENT: Meghan Hickman DOB: 28-Oct-1950  Chief Complaint  Patient presents with  . Expressive Aphasia    Rm. 4.  Sts. speech disturbance is no better. Sts. was seen at [facility], dx.with primary progressive aphasia and told there really is not anything they can do to improve or resolve it.  She was started on Donepezil, believes she is taking 5mg  and tolerating it well/fim     HISTORICAL  Meghan Hickman is a 67 years old right-handed female, alone at today's clinical visit, seen in refer by  her primary care physician Dr. Elgie Congo in July 08 2015 for evaluation of speech difficulty  She had a history of hypertension, hyperlipidemia, diabetes, depression, was recently started on Depakote 500 mg daily  She began to noticed gradual onset slurred speech since April 2016, initially attributed to her dental work, even after it was fixed, she continue have progressive slow talking, struggle with words, intermittent slurring, she denies chewing swallowing difficulty, she denies visual loss, she work as a Hydrographic surveyor, no limb muscle weakness,  She recently found to have enlarged thyroid, needle biopsy is planned, ultrasound of carotid artery was patent,  We have Reviewed CAT scan in October 2016, periventricular white matter  disease, no acute lesions  UPDATE Jul 24 2015: She still struggle with words, mild slur,word finding difficulty, mild confusion. MRI of the brain reviewed, mild small vessel disease, more on left hemisphere  She has no acute lesions  UPDATE May 15th 2017:  She suffered sinus infection in early May, is treated with antibiotics, she had a temporary upper denture, complains of falling object sensation, mildly slurred speech, but overall is getting better, denies swallowing difficulty, she denies limb muscle weakness  I have reviewed laboratory evaluation, elevated CBC in Dec 18 2014 16.2,  Personally reviewed MRI of the brain with patients again, supratentorium small vessel disease, especially at the deep white matter, she also complains of worsening depression, bilateral ear tinnitus,  UPDATE Sep 15 2017: Her speech is getting worse, the words would not come out, she has no trouble swallowing, no comprehensive problem. She has no double vision.   She has mild memory loss.   She has no family history of memory loss.  She has no weakness.  She still works at post office, Herbalist. She complains of depression for many years, she lives alone.  She goes out with her friends occasionally.  She also complains of vibratory sensation in her head, snoring, frequent awakening, excessive daytime fatigue and sleepiness  UPDATE May 25 2018: She was seen at [facility] in July 2019 by Dr. Sherryll Burger, was diagnosed with primary progressive aphasia, no trouble understanding, but has word finding difficulty, gradually getting worse.  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 albuterol (PROVENTIL HFA;VENTOLIN HFA) 108 (90 Base) MCG/ACT inhaler Inhale 2 puffs into the  lungs every 6 (six) hours as needed for wheezing or shortness of breath. 1 Inhaler 0  . almotriptan (AXERT) 12.5 MG tablet Take 12.5 mg by mouth daily as needed for migraine.   4  . ALPRAZolam (XANAX) 1 MG tablet Take 1-2 tablets thirty minutes prior to MRI.  May repeat with 1 additional tablet prior to entering scanner, if needed.  Must have driver. 3 tablet 0  . amoxicillin-clavulanate (AUGMENTIN) 875-125 MG tablet Take 1 tablet by mouth 2 (two) times daily. 20 tablet 0  . aspirin 81 MG tablet Take 81 mg by mouth daily.    [name] Kitchen atorvastatin (LIPITOR) 20 MG tablet TAKE 1 TABLET BY MOUTH EVERY DAY AT 6 PM 90 tablet 3  . donepezil (ARICEPT) 5 MG tablet Take by mouth.    . escitalopram (LEXAPRO) 20 MG tablet TAKE 1 TABLET BY MOUTH EVERY DAY 90 tablet 1  . estradiol (ESTRACE) 0.5 MG tablet Take 0.5 mg by mouth daily.  0  . gabapentin (NEURONTIN) 100 MG capsule TAKE 3 CAPSULES EVERY DAY    . ipratropium (ATROVENT) 0.03 % nasal spray Place 2 sprays into both nostrils 2 (two) times daily. 30 mL 0  . losartan-hydrochlorothiazide (HYZAAR) 50-12.5 MG tablet Take 1 tablet by mouth daily. 90 tablet 1  . Multiple Vitamins-Minerals (MULTIVITAMIN & MINERAL PO) Take 1 tablet by mouth daily.    . pantoprazole (PROTONIX) 40 MG tablet Take by mouth.    . temazepam (RESTORIL) 30 MG capsule TAKE 1 CAPSULE BY MOUTH AT BEDTIME AS NEEDED FOR SLEEP 30 capsule 0  . traMADol (ULTRAM) 50 MG tablet Take 1 tablet (50 mg total) by mouth every 6 (six) hours as needed. 30 tablet 0  . traZODone (DESYREL) 50 MG tablet trazodone 50 mg tablet     No current facility-administered medications for this visit.     PAST MEDICAL HISTORY: Past Medical History:  Diagnosis Date  . Arthritis   . Depression   . Diabetes mellitus without complication (HCC)   . Diverticulitis   . GERD (gastroesophageal reflux disease)   . Headache   . Heart murmur   . Hyperlipidemia   . Hypertension   . Speech problem   . Wears glasses     PAST  SURGICAL HISTORY: Past Surgical History:  Procedure Laterality Date  . CHOLECYSTECTOMY N/A 12/05/2014   Procedure: LAPAROSCOPIC CHOLECYSTECTOMY;  Surgeon: Emelia Loron, MD;  Location: [facility] OR;  Service: General;  Laterality: N/A;  . SHOULDER ARTHROSCOPY WITH SUBACROMIAL DECOMPRESSION AND BICEP TENDON REPAIR Right 04/23/2013   Procedure: SHOULDER ARTHROSCOPY WITH SUBACROMIAL DECOMPRESSION AND BICEP TENDON TENOTOMY;  Surgeon: Mable Paris, MD;  Location: [facility];  Service: Orthopedics;  Laterality: Right;    FAMILY HISTORY: Family History  Problem Relation Age of Onset  . Stroke Mother   . Hypertension Mother   . Diabetes Mother   . Breast cancer Mother   . Healthy Father   . Heart disease Brother     SOCIAL HISTORY:  Social History   Socioeconomic History  . Marital status: Single    Spouse name: Not on file  . Number of children: 0  .  Years of education: 10  . Highest education level: Not on file  Occupational History  . Occupation: Research scientist (physical sciences)  Social Needs  . Financial resource strain: Not on file  . Food insecurity:    Worry: Not on file    Inability: Not on file  . Transportation needs:    Medical: Not on file    Non-medical: Not on file  Tobacco Use  . Smoking status: Former Smoker    Packs/day: 0.50    Years: 20.00    Pack years: 10.00    Types: Cigarettes    Start date: 04/30/2012    Last attempt to quit: 08/16/2012    Years since quitting: 5.7  . Smokeless tobacco: Never Used  Substance and Sexual Activity  . Alcohol use: No    Alcohol/week: 0.0 standard drinks  . Drug use: No  . Sexual activity: Never    Comment: was smoking 1/2 pppd  Lifestyle  . Physical activity:    Days per week: Not on file    Minutes per session: Not on file  . Stress: Not on file  Relationships  . Social connections:    Talks on phone: Not on file    Gets together: Not on file    Attends religious service: Not on file    Active member of  club or organization: Not on file    Attends meetings of clubs or organizations: Not on file    Relationship status: Not on file  . Intimate partner violence:    Fear of current or ex partner: Not on file    Emotionally abused: Not on file    Physically abused: Not on file    Forced sexual activity: Not on file  Other Topics Concern  . Not on file  Social History Narrative   Lives at home alone.   Right-handed.   2 cups caffeine per day.     PHYSICAL EXAM   Vitals:   05/25/18 0711  BP: 126/79  Pulse: 87  Resp: 18  Weight: 152 lb (68.9 kg)  Height: 5' (1.524 m)    Not recorded      Body mass index is 29.69 kg/m.  PHYSICAL EXAMNIATION:  Gen: NAD, conversant, well nourised, obese, well groomed                     Cardiovascular: Regular rate rhythm, no peripheral edema, warm, nontender. Eyes: Conjunctivae clear without exudates or hemorrhage Neck: Supple, no carotid bruise. Pulmonary: Clear to auscultation bilaterally   NEUROLOGICAL EXAM:  MENTAL STATUS: Speech:  Mild hesitation in her speech, mild slurred, but variable degree, struggle with the world, normal comprehension, Cognition:     Orientation to time, place and person     Normal recent and remote memory     Normal Attention span and concentration     Normal Language, naming, repeating,spontaneous speech     Fund of knowledge   CRANIAL NERVES: CN II: Visual fields are full to confrontation. Fundoscopic exam is normal with sharp discs and no vascular changes. Pupils are round equal and briskly reactive to light. CN III, IV, VI: extraocular movement are normal. No ptosis. CN V: Facial sensation is intact to pinprick in all 3 divisions bilaterally. Corneal responses are intact.  CN VII: Face is symmetric with normal eye closure and smile. CN VIII: Hearing is normal to rubbing fingers CN IX, X: Palate elevates symmetrically. Phonation is normal. CN XI: Head turning and shoulder shrug are intact CN  XII:  Tongue is midline with normal movements and no atrophy.  MOTOR: There is no pronator drift of out-stretched arms. Muscle bulk and tone are normal. Muscle strength is normal.  REFLEXES: Reflexes are 2+ and symmetric at the biceps, triceps, knees, and ankles. Plantar responses are flexor.  SENSORY: Intact to light touch, pinprick, position sense, and vibration sense are intact in fingers and toes.  COORDINATION: Rapid alternating movements and fine finger movements are intact. There is no dysmetria on finger-to-nose and heel-knee-shin.    GAIT/STANCE: Posture is normal. Gait is steady with normal steps, base, arm swing, and turning. Heel and toe walking are normal. Tandem gait is normal.  Romberg is absent.   DIAGNOSTIC DATA (LABS, IMAGING, TESTING) - I reviewed patient records, labs, notes, testing and imaging myself where available.   ASSESSMENT AND PLAN  Meghan Hickman is a 67 y.o. 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 Ph.D.  [facility] [address] [place], [place] [address] Ph: [phone] Fax: [phone]  CC: Collene Gobble, MD

## 2018-06-07 DIAGNOSIS — K648 Other hemorrhoids: Secondary | ICD-10-CM | POA: Diagnosis not present

## 2018-06-07 DIAGNOSIS — K219 Gastro-esophageal reflux disease without esophagitis: Secondary | ICD-10-CM | POA: Diagnosis not present

## 2018-06-15 ENCOUNTER — Other Ambulatory Visit: Payer: Self-pay | Admitting: Family Medicine

## 2018-06-15 DIAGNOSIS — E782 Mixed hyperlipidemia: Secondary | ICD-10-CM

## 2018-06-15 NOTE — Telephone Encounter (Signed)
Requested Prescriptions  Pending Prescriptions Disp Refills  . atorvastatin (LIPITOR) 20 MG tablet [Pharmacy Med Name: ATORVASTATIN 20 MG TABLET] 90 tablet 0    Sig: TAKE 1 TABLET BY MOUTH EVERY DAY AT 6 PM     Cardiovascular:  Antilipid - Statins Failed - 06/15/2018  4:51 AM      Failed - Total Cholesterol in normal range and within 360 days    Cholesterol, Total  Date Value Ref Range Status  06/02/2017 169 100 - 199 mg/dL Final         Failed - LDL in normal range and within 360 days    LDL Calculated  Date Value Ref Range Status  06/02/2017 103 (H) 0 - 99 mg/dL Final         Failed - HDL in normal range and within 360 days    HDL  Date Value Ref Range Status  06/02/2017 56 >39 mg/dL Final         Failed - Triglycerides in normal range and within 360 days    Triglycerides  Date Value Ref Range Status  06/02/2017 51 0 - 149 mg/dL Final         Passed - Patient is not pregnant      Passed - Valid encounter within last 12 months    Recent Outpatient Visits          5 months ago Controlled type 2 diabetes mellitus with diabetic nephropathy, without long-term current use of insulin (HCC)   Primary Care at [facility], Manus Rudd, MD   6 months ago Diverticulitis of colon   Primary Care at [facility], Eilleen Kempf, MD   6 months ago Diverticulitis of colon   Primary Care at [facility], [place], [place]   1 year ago Diabetes mellitus without complication [facility])   Primary Care at [facility], Zoe A, MD   1 year ago Type 2 diabetes mellitus without complication, without long-term current use of insulin [facility])   Primary Care at [facility], Manus Rudd, MD

## 2018-06-22 DIAGNOSIS — G4733 Obstructive sleep apnea (adult) (pediatric): Secondary | ICD-10-CM | POA: Diagnosis not present

## 2018-07-11 ENCOUNTER — Ambulatory Visit: Payer: Federal, State, Local not specified - PPO | Admitting: Adult Health

## 2018-07-28 ENCOUNTER — Telehealth: Payer: Self-pay | Admitting: Family Medicine

## 2018-07-28 NOTE — Telephone Encounter (Signed)
Copied from CRM [id number]. Topic: General - Other >> Jul 28, 2018  3:06 PM Tamela OddiHarris, Brenda J wrote: Reason for CRM: Patient called to speak with the nurse or doctor regarding her insurance.  She needs to go over some things in order to keep her coverage.  Please call patient back at [phone].

## 2018-07-31 NOTE — Telephone Encounter (Signed)
Spoke with patient she was calling to see if her new insurance company have contacted the office. I explained to the patient that I see nothing in the chart to reference to. She was advised to contact the insurance regarding what information is needed.

## 2018-08-31 DIAGNOSIS — Z961 Presence of intraocular lens: Secondary | ICD-10-CM | POA: Diagnosis not present

## 2018-08-31 DIAGNOSIS — H35372 Puckering of macula, left eye: Secondary | ICD-10-CM | POA: Diagnosis not present

## 2018-08-31 DIAGNOSIS — E119 Type 2 diabetes mellitus without complications: Secondary | ICD-10-CM | POA: Diagnosis not present

## 2018-09-13 ENCOUNTER — Ambulatory Visit: Payer: Federal, State, Local not specified - PPO | Admitting: Family Medicine

## 2018-09-13 ENCOUNTER — Other Ambulatory Visit: Payer: Self-pay

## 2018-09-13 ENCOUNTER — Encounter: Payer: Self-pay | Admitting: Family Medicine

## 2018-09-13 VITALS — BP 115/74 | HR 72 | Temp 98.4°F | Resp 17 | Ht 60.0 in | Wt 158.4 lb

## 2018-09-13 DIAGNOSIS — E782 Mixed hyperlipidemia: Secondary | ICD-10-CM

## 2018-09-13 DIAGNOSIS — F331 Major depressive disorder, recurrent, moderate: Secondary | ICD-10-CM

## 2018-09-13 DIAGNOSIS — Z299 Encounter for prophylactic measures, unspecified: Secondary | ICD-10-CM

## 2018-09-13 DIAGNOSIS — I10 Essential (primary) hypertension: Secondary | ICD-10-CM

## 2018-09-13 DIAGNOSIS — Z23 Encounter for immunization: Secondary | ICD-10-CM

## 2018-09-13 DIAGNOSIS — E1121 Type 2 diabetes mellitus with diabetic nephropathy: Secondary | ICD-10-CM | POA: Diagnosis not present

## 2018-09-13 DIAGNOSIS — E2839 Other primary ovarian failure: Secondary | ICD-10-CM

## 2018-09-13 DIAGNOSIS — R195 Other fecal abnormalities: Secondary | ICD-10-CM

## 2018-09-13 LAB — CMP14+EGFR
ALT: 24 IU/L (ref 0–32)
AST: 33 IU/L (ref 0–40)
Albumin/Globulin Ratio: 1.9 (ref 1.2–2.2)
Albumin: 4.3 g/dL (ref 3.8–4.8)
Alkaline Phosphatase: 97 IU/L (ref 39–117)
BUN/Creatinine Ratio: 14 (ref 12–28)
BUN: 12 mg/dL (ref 8–27)
Bilirubin Total: 0.8 mg/dL (ref 0.0–1.2)
CO2: 23 mmol/L (ref 20–29)
CREATININE: 0.86 mg/dL (ref 0.57–1.00)
Calcium: 9.2 mg/dL (ref 8.7–10.3)
Chloride: 105 mmol/L (ref 96–106)
GFR calc Af Amer: 80 mL/min/{1.73_m2} (ref >59)
GFR, EST NON AFRICAN AMERICAN: 70 mL/min/{1.73_m2} (ref >59)
GLUCOSE: 125 mg/dL — AB (ref 65–99)
Globulin, Total: 2.3 g/dL (ref 1.5–4.5)
Potassium: 4.2 mmol/L (ref 3.5–5.2)
Sodium: 144 mmol/L (ref 134–144)
Total Protein: 6.6 g/dL (ref 6.0–8.5)

## 2018-09-13 LAB — POCT GLYCOSYLATED HEMOGLOBIN (HGB A1C): Hemoglobin A1C: 6.4 % — AB (ref 4.0–5.6)

## 2018-09-13 LAB — LIPID PANEL
Chol/HDL Ratio: 3.6 ratio (ref 0.0–4.4)
Cholesterol, Total: 168 mg/dL (ref 100–199)
HDL: 47 mg/dL (ref >39)
LDL Calculated: 108 mg/dL — ABNORMAL HIGH (ref 0–99)
TRIGLYCERIDES: 63 mg/dL (ref 0–149)
VLDL Cholesterol Cal: 13 mg/dL (ref 5–40)

## 2018-09-13 MED ORDER — TRAMADOL HCL 50 MG PO TABS: 50.0000 mg | ORAL_TABLET | Freq: Four times a day (QID) | ORAL | 0 refills | Status: DC | PRN

## 2018-09-13 MED ORDER — LOSARTAN POTASSIUM-HCTZ 50-12.5 MG PO TABS: 0.5000 | ORAL_TABLET | Freq: Every day | ORAL | 1 refills | Status: DC

## 2018-09-13 MED ORDER — ATORVASTATIN CALCIUM 20 MG PO TABS: ORAL_TABLET | ORAL | 3 refills | Status: DC

## 2018-09-13 MED ORDER — ESCITALOPRAM OXALATE 20 MG PO TABS: 20.0000 mg | ORAL_TABLET | Freq: Every day | ORAL | 3 refills | Status: DC

## 2018-09-13 NOTE — Progress Notes (Signed)
Established Patient Office Visit  Subjective:  Patient ID: Meghan Hickman, female    DOB: 11/21/1950  Age: 68 y.o. MRN: [id number]  CC:  Chief Complaint  Patient presents with  . Diabetes    A1C f/u    HPI [name] presents for   Diabetes Mellitus: Patient presents for follow up of diabetes. Symptoms: none.  Patient denies foot ulcerations, hyperglycemia, hypoglycemia , increase appetite, nausea and paresthesia of the feet.  Evaluation to date has been included: hemoglobin A1C.  Home sugars: patient does not check sugars. Treatment to date: no recent interventions.  SHE IS DIET CONTROLLED  Lab Results  Component Value Date   HGBA1C 6.4 (A) 09/13/2018   Wt Readings from Last 3 Encounters:  09/13/18 158 lb 6.4 oz (71.8 kg)  05/25/18 152 lb (68.9 kg)  03/21/18 153 lb (69.4 kg)    Hypertension: Patient here for follow-up of elevated blood pressure. She is not exercising and is not adherent to low salt diet.  Blood pressure is well controlled at home. Cardiac symptoms none. Patient denies chest pain, chest pressure/discomfort, dyspnea, exertional chest pressure/discomfort, fatigue and lower extremity edema.  She has not taken her bp meds IN 5 DAYS. BP Readings from Last 3 Encounters:  09/13/18 115/74  05/25/18 126/79  03/21/18 108/80    Fecal urgency and GERD She saw Dr. Allyn Kenner for her loose stools and fecal urgency He recommended that she taper off some of her meds and suggested stopping ppi and zantac and starting pepcid Also limiting her caffeine and dairy and starting a probiotic She is retired now and does not need as much caffeine  ANXIETY AND DEPRESSION  Reports better mood since retirement She plans to start exercising at the gym She takes lexapro daily as instructed  Depression screen [facility] 2/9 09/13/2018 12/28/2017 12/28/2017 12/09/2017 06/02/2017  Decreased Interest 0 0 0 0 0  Down, Depressed, Hopeless 0 0 0 0 0  PHQ - 2 Score 0 0 0 0 0  Altered sleeping -  - - - -  Tired, decreased energy - - - - -  Change in appetite - - - - -  Feeling bad or failure about yourself  - - - - -  Trouble concentrating - - - - -  Moving slowly or fidgety/restless - - - - -  Suicidal thoughts - - - - -  PHQ-9 Score - - - - -  Difficult doing work/chores - - - - -  Some recent data might be hidden    Past Medical History:  Diagnosis Date  . Arthritis   . Depression   . Diabetes mellitus without complication ([place])   . Diverticulitis   . GERD (gastroesophageal reflux disease)   . Headache   . Heart murmur   . Hyperlipidemia   . Hypertension   . Speech problem   . Wears glasses     Past Surgical History:  Procedure Laterality Date  . CHOLECYSTECTOMY N/A 12/05/2014   Procedure: LAPAROSCOPIC CHOLECYSTECTOMY;  Surgeon: Rolm Bookbinder, MD;  Location: [place];  Service: General;  Laterality: N/A;  . SHOULDER ARTHROSCOPY WITH SUBACROMIAL DECOMPRESSION AND BICEP TENDON REPAIR Right 04/23/2013   Procedure: SHOULDER ARTHROSCOPY WITH SUBACROMIAL DECOMPRESSION AND BICEP TENDON TENOTOMY;  Surgeon: Nita Sells, MD;  Location: [place];  Service: Orthopedics;  Laterality: Right;    Family History  Problem Relation Age of Onset  . Stroke Mother   . Hypertension Mother   . Diabetes Mother   .  Breast cancer Mother   . Healthy Father   . Heart disease Brother     Social History   Socioeconomic History  . Marital status: Single    Spouse name: Not on file  . Number of children: 0  . Years of education: 61  . Highest education level: Not on file  Occupational History  . Occupation: Scientist, research (medical)  Social Needs  . Financial resource strain: Not on file  . Food insecurity:    Worry: Not on file    Inability: Not on file  . Transportation needs:    Medical: Not on file    Non-medical: Not on file  Tobacco Use  . Smoking status: Former Smoker    Packs/day: 0.50    Years: 20.00    Pack years: 10.00    Types: Cigarettes     Start date: 04/30/2012    Last attempt to quit: 08/16/2012    Years since quitting: 6.0  . Smokeless tobacco: Never Used  Substance and Sexual Activity  . Alcohol use: No    Alcohol/week: 0.0 standard drinks  . Drug use: No  . Sexual activity: Never    Comment: was smoking 1/2 pppd  Lifestyle  . Physical activity:    Days per week: Not on file    Minutes per session: Not on file  . Stress: Not on file  Relationships  . Social connections:    Talks on phone: Not on file    Gets together: Not on file    Attends religious service: Not on file    Active member of club or organization: Not on file    Attends meetings of clubs or organizations: Not on file    Relationship status: Not on file  . Intimate partner violence:    Fear of current or ex partner: Not on file    Emotionally abused: Not on file    Physically abused: Not on file    Forced sexual activity: Not on file  Other Topics Concern  . Not on file  Social History Narrative   Lives at home alone.   Right-handed.   2 cups caffeine per day.    Outpatient Medications Prior to Visit  Medication Sig Dispense Refill  . albuterol (PROVENTIL HFA;VENTOLIN HFA) 108 (90 Base) MCG/ACT inhaler Inhale 2 puffs into the lungs every 6 (six) hours as needed for wheezing or shortness of breath. 1 Inhaler 0  . almotriptan (AXERT) 12.5 MG tablet Take 12.5 mg by mouth daily as needed for migraine.   4  . ALPRAZolam (XANAX) 1 MG tablet Take 1-2 tablets thirty minutes prior to MRI.  May repeat with 1 additional tablet prior to entering scanner, if needed.  Must have driver. 3 tablet 0  . aspirin 81 MG tablet Take 81 mg by mouth daily.    [name] Kitchen donepezil (ARICEPT) 10 MG tablet Take 1 tablet (10 mg total) by mouth at bedtime. 30 tablet 5  . estradiol (ESTRACE) 0.5 MG tablet Take 0.5 mg by mouth daily.  0  . famotidine (PEPCID) 40 MG tablet Take 40 mg by mouth daily.    [name] Kitchen gabapentin (NEURONTIN) 100 MG capsule TAKE 3 CAPSULES EVERY DAY    .  ipratropium (ATROVENT) 0.03 % nasal spray Place 2 sprays into both nostrils 2 (two) times daily. 30 mL 0  . Multiple Vitamins-Minerals (MULTIVITAMIN & MINERAL PO) Take 1 tablet by mouth daily.    . temazepam (RESTORIL) 30 MG capsule TAKE 1 CAPSULE BY MOUTH AT BEDTIME  AS NEEDED FOR SLEEP 30 capsule 0  . traZODone (DESYREL) 50 MG tablet trazodone 50 mg tablet    . amoxicillin-clavulanate (AUGMENTIN) 875-125 MG tablet Take 1 tablet by mouth 2 (two) times daily. 20 tablet 0  . atorvastatin (LIPITOR) 20 MG tablet TAKE 1 TABLET BY MOUTH EVERY DAY AT 6 PM 90 tablet 0  . escitalopram (LEXAPRO) 20 MG tablet TAKE 1 TABLET BY MOUTH EVERY DAY 90 tablet 1  . losartan-hydrochlorothiazide (HYZAAR) 50-12.5 MG tablet Take 1 tablet by mouth daily. 90 tablet 1  . traMADol (ULTRAM) 50 MG tablet Take 1 tablet (50 mg total) by mouth every 6 (six) hours as needed. 30 tablet 0  . pantoprazole (PROTONIX) 40 MG tablet Take by mouth.     No facility-administered medications prior to visit.     No Known Allergies  ROS Review of Systems Review of Systems  Constitutional: Negative for activity change, appetite change, chills and fever.  HENT: Negative for congestion, nosebleeds, trouble swallowing and voice change.   Respiratory: Negative for cough, shortness of breath and wheezing.   Gastrointestinal: Negative for diarrhea, nausea and vomiting.  Genitourinary: Negative for difficulty urinating, dysuria, flank pain and hematuria.  Musculoskeletal: Negative for back pain, joint swelling and neck pain.  Neurological: Negative for dizziness, speech difficulty, light-headedness and numbness.  See HPI. All other review of systems negative.     Objective:    Physical Exam  BP 115/74 (BP Location: Left Arm, Patient Position: Sitting, Cuff Size: Large)   Pulse 72   Temp 98.4 F (36.9 C) (Oral)   Resp 17   Ht 5' (1.524 m)   Wt 158 lb 6.4 oz (71.8 kg)   SpO2 95%   BMI 30.94 kg/m  Wt Readings from Last 3  Encounters:  09/13/18 158 lb 6.4 oz (71.8 kg)  05/25/18 152 lb (68.9 kg)  03/21/18 153 lb (69.4 kg)   Physical Exam  Constitutional: Oriented to person, place, and time. Appears well-developed and well-nourished.  HENT:  Head: Normocephalic and atraumatic.  Eyes: Conjunctivae and EOM are normal.  Cardiovascular: Normal rate, regular rhythm, normal heart sounds and intact distal pulses.  No murmur heard. Pulmonary/Chest: Effort normal and breath sounds normal. No stridor. No respiratory distress. Has no wheezes.  Neurological: Is alert and oriented to person, place, and time.  Skin: Skin is warm. Capillary refill takes less than 2 seconds.  Psychiatric: Has a normal mood and affect. Behavior is normal. Judgment and thought content normal.    Health Maintenance Due  Topic Date Due  . TETANUS/TDAP  02/22/1969  . DEXA SCAN  02/23/2015  . PNA vac Low Risk Adult (2 of 2 - PPSV23) 10/13/2017  . INFLUENZA VACCINE  03/09/2018  . HEMOGLOBIN A1C  06/30/2018    There are no preventive care reminders to display for this patient.  Lab Results  Component Value Date   TSH 2.360 10/13/2016   Lab Results  Component Value Date   WBC 7.3 12/04/2017   HGB 12.9 12/04/2017   HCT 40.9 12/04/2017   MCV 85.7 12/04/2017   PLT 370 12/04/2017   Lab Results  Component Value Date   NA 142 12/02/2017   K 3.8 12/02/2017   CO2 23 12/02/2017   GLUCOSE 110 (H) 12/02/2017   BUN 11 12/02/2017   CREATININE 0.80 12/02/2017   BILITOT 0.6 06/02/2017   ALKPHOS 94 06/02/2017   AST 47 (H) 06/02/2017   ALT 31 06/02/2017   PROT 7.0 06/02/2017   ALBUMIN 4.5  06/02/2017   CALCIUM 9.1 12/02/2017   ANIONGAP 11 11/30/2015   Lab Results  Component Value Date   CHOL 169 06/02/2017   Lab Results  Component Value Date   HDL 56 06/02/2017   Lab Results  Component Value Date   LDLCALC 103 (H) 06/02/2017   Lab Results  Component Value Date   TRIG 51 06/02/2017   Lab Results  Component Value Date    CHOLHDL 3.0 06/02/2017   Lab Results  Component Value Date   HGBA1C 6.4 (A) 09/13/2018      Assessment & Plan:   Problem List Items Addressed This Visit      Cardiovascular and Mediastinum   Essential hypertension- Patient's blood pressure is at goal of 139/89 or less. Condition is stable. Continue current medications and treatment plan. I recommend that you exercise for 30-45 minutes 5 days a week. I also recommend a balanced diet with fruits and vegetables every day, lean meats, and little fried foods. The DASH diet (you can find this online) is a good example of this. Take 1/2 tablet and in 3 months will stop bp meds for a trial wean   Relevant Medications   losartan-hydrochlorothiazide (HYZAAR) 50-12.5 MG tablet   atorvastatin (LIPITOR) 20 MG tablet     Endocrine   Type 2 diabetes mellitus, controlled (HCC) - Primary Stable a1c well controlled hemoglobin a1c is at goal Continue exercise Lipids monitored and renal function in range On ADA diet On arb On asa 63m Reviewed diabetic foot care Emphasized importance of eye and dental exam      Relevant Medications   losartan-hydrochlorothiazide (HYZAAR) 50-12.5 MG tablet   atorvastatin (LIPITOR) 20 MG tablet   Other Relevant Orders   POCT glycosylated hemoglobin (Hb A1C) (Completed)   Lipid panel   CMP14+EGFR     Other   Moderate episode of recurrent major depressive disorder (HCC) - stable on lexapro   Relevant Medications   escitalopram (LEXAPRO) 20 MG tablet    Other Visit Diagnoses    Mixed hyperlipidemia    - Discussed medications that affect lipids Reminded patient to avoid grapefruits Reviewed last 3 lipids Discussed current meds: statin, aspirin Advised dietary fiber and fish oil and ways to keep HDL high CAD prevention and reviewed side effects of statins Patient is a nonsmoker.     Relevant Medications   losartan-hydrochlorothiazide (HYZAAR) 50-12.5 MG tablet   atorvastatin (LIPITOR) 20 MG tablet    Other Relevant Orders   Lipid panel   CMP14+EGFR   Need for prophylactic vaccination against Streptococcus pneumoniae (pneumococcus)       Relevant Orders   Pneumococcal polysaccharide vaccine 23-valent greater than or equal to 2yo subcutaneous/IM   Need for vaccination       Relevant Orders   Flu Vaccine QUAD 36+ mos IM   Pneumococcal polysaccharide vaccine 23-valent greater than or equal to 2yo subcutaneous/IM   Td vaccine preservative free greater than or equal to 7yo IM   Estrogen deficiency    - will screen for bone density issues   Relevant Orders   HM DEXA SCAN (Completed)   Need for prophylactic vaccination and inoculation against influenza       Loose stools    -  Will taper down meds and adjust based on GI recommendations      Meds ordered this encounter  Medications  . losartan-hydrochlorothiazide (HYZAAR) 50-12.5 MG tablet    Sig: Take 0.5 tablets by mouth daily.    Dispense:  45 tablet  Refill:  1  . escitalopram (LEXAPRO) 20 MG tablet    Sig: Take 1 tablet (20 mg total) by mouth daily.    Dispense:  90 tablet    Refill:  3  . traMADol (ULTRAM) 50 MG tablet    Sig: Take 1 tablet (50 mg total) by mouth every 6 (six) hours as needed.    Dispense:  30 tablet    Refill:  0  . atorvastatin (LIPITOR) 20 MG tablet    Sig: Take one tablet by mouth daily    Dispense:  90 tablet    Refill:  3    Follow-up: No follow-ups on file.    Forrest Moron, MD

## 2018-09-13 NOTE — Patient Instructions (Signed)
° ° ° °  If you have lab work done today you will be contacted with your lab results within the next 2 weeks.  If you have not heard from us then please contact us. The fastest way to get your results is to register for My Chart. ° ° °IF you received an x-ray today, you will receive an invoice from [facility]. Please contact [facility] at [phone] with questions or concerns regarding your invoice.  ° °IF you received labwork today, you will receive an invoice from [facility]. Please contact [facility] at [phone] with questions or concerns regarding your invoice.  ° °Our billing staff will not be able to assist you with questions regarding bills from these companies. ° °You will be contacted with the lab results as soon as they are available. The fastest way to get your results is to activate your My Chart account. Instructions are located on the last page of this paperwork. If you have not heard from us regarding the results in 2 weeks, please contact this office. °  ° ° ° °

## 2018-12-11 ENCOUNTER — Other Ambulatory Visit: Payer: Self-pay

## 2018-12-11 ENCOUNTER — Telehealth (INDEPENDENT_AMBULATORY_CARE_PROVIDER_SITE_OTHER): Payer: Federal, State, Local not specified - PPO | Admitting: Family Medicine

## 2018-12-11 DIAGNOSIS — I10 Essential (primary) hypertension: Secondary | ICD-10-CM | POA: Diagnosis not present

## 2018-12-11 DIAGNOSIS — R4789 Other speech disturbances: Secondary | ICD-10-CM

## 2018-12-11 DIAGNOSIS — G2581 Restless legs syndrome: Secondary | ICD-10-CM | POA: Diagnosis not present

## 2018-12-11 DIAGNOSIS — E1121 Type 2 diabetes mellitus with diabetic nephropathy: Secondary | ICD-10-CM | POA: Diagnosis not present

## 2018-12-11 MED ORDER — GABAPENTIN 300 MG PO CAPS: 300.0000 mg | ORAL_CAPSULE | Freq: Three times a day (TID) | ORAL | 3 refills | Status: DC

## 2018-12-11 NOTE — Progress Notes (Signed)
Telemedicine Encounter- SOAP NOTE Established Patient  This telephone encounter was conducted with the patient's (or proxy's) verbal consent via audio telecommunications: yes/no: Yes Patient was instructed to have this encounter in a suitably private space; and to only have persons present to whom they give permission to participate. In addition, patient identity was confirmed by use of name plus two identifiers (DOB and address).  I discussed the limitations, risks, security and privacy concerns of performing an evaluation and management service by telephone and the availability of in person appointments. I also discussed with the patient that there may be a patient responsible charge related to this service. The patient expressed understanding and agreed to proceed.  I spent a total of TIME; 0 MIN TO 60 MIN: 25 minutes talking with the patient or their proxy.  CC: leg cramps  Subjective   Meghan Hickman is a 68 y.o. established patient. Telephone visit today for  HPI  Hypertension: Patient here for follow-up of elevated blood pressure. She is not exercising and is adherent to low salt diet.  Blood pressure is well controlled at home. Cardiac symptoms none. Patient denies chest pain, chest pressure/discomfort, dyspnea, exertional chest pressure/discomfort, irregular heart beat and lower extremity edema.  Cardiovascular risk factors: hypertension. Use of agents associated with hypertension: none. History of target organ damage: none. BP Readings from Last 3 Encounters:  09/13/18 115/74  05/25/18 126/79  03/21/18 108/80    Muscle cramps/restless leg syndrome She has trouble sleeping due to leg cramping She states that she also has a hard time and has to put a warm towel on it She states that she has to shake her legs to get them to calm down She is taking gabapentin 100mg  tid      Patient Active Problem List   Diagnosis Date Noted  . Primary progressive aphasia (HCC)  05/25/2018  . Expressive aphasia 09/15/2017  . Migraine 10/13/2016  . Insomnia 10/13/2016  . Essential hypertension 10/13/2016  . Moderate episode of recurrent major depressive disorder (HCC) 05/12/2016  . Small vessel disease, cerebrovascular 12/22/2015  . Slurred speech 07/24/2015  . Abnormal CT of brain 07/08/2015  . Speech abnormality 06/20/2015  . Thyroid nodule 06/20/2015  . S/P laparoscopic cholecystectomy 12/05/2014  . Heart murmur 02/11/2013  . Type 2 diabetes mellitus, controlled (HCC) 02/09/2013  . Other and unspecified hyperlipidemia 02/09/2013    Past Medical History:  Diagnosis Date  . Arthritis   . Depression   . Diabetes mellitus without complication (HCC)   . Diverticulitis   . GERD (gastroesophageal reflux disease)   . Headache   . Heart murmur   . Hyperlipidemia   . Hypertension   . Speech problem   . Wears glasses     Current Outpatient Medications  Medication Sig Dispense Refill  . albuterol (PROVENTIL HFA;VENTOLIN HFA) 108 (90 Base) MCG/ACT inhaler Inhale 2 puffs into the lungs every 6 (six) hours as needed for wheezing or shortness of breath. 1 Inhaler 0  . almotriptan (AXERT) 12.5 MG tablet Take 12.5 mg by mouth daily as needed for migraine.   4  . ALPRAZolam (XANAX) 1 MG tablet Take 1-2 tablets thirty minutes prior to MRI.  May repeat with 1 additional tablet prior to entering scanner, if needed.  Must have driver. 3 tablet 0  . aspirin 81 MG tablet Take 81 mg by mouth daily.    [name] Kitchen. atorvastatin (LIPITOR) 20 MG tablet Take one tablet by mouth daily 90 tablet 3  . escitalopram (  LEXAPRO) 20 MG tablet Take 1 tablet (20 mg total) by mouth daily. 90 tablet 3  . estradiol (ESTRACE) 0.5 MG tablet Take 0.5 mg by mouth daily.  0  . famotidine (PEPCID) 40 MG tablet Take 40 mg by mouth daily.    [name] Kitchen ipratropium (ATROVENT) 0.03 % nasal spray Place 2 sprays into both nostrils 2 (two) times daily. 30 mL 0  . losartan-hydrochlorothiazide (HYZAAR) 50-12.5 MG tablet  Take 0.5 tablets by mouth daily. 45 tablet 1  . Multiple Vitamins-Minerals (MULTIVITAMIN & MINERAL PO) Take 1 tablet by mouth daily.    . temazepam (RESTORIL) 30 MG capsule TAKE 1 CAPSULE BY MOUTH AT BEDTIME AS NEEDED FOR SLEEP 30 capsule 0  . traMADol (ULTRAM) 50 MG tablet Take 1 tablet (50 mg total) by mouth every 6 (six) hours as needed. 30 tablet 0  . traZODone (DESYREL) 50 MG tablet trazodone 50 mg tablet    . donepezil (ARICEPT) 10 MG tablet Take 1 tablet (10 mg total) by mouth at bedtime. 30 tablet 5  . gabapentin (NEURONTIN) 300 MG capsule Take 1 capsule (300 mg total) by mouth 3 (three) times daily. 90 capsule 3   No current facility-administered medications for this visit.     No Known Allergies  Social History   Socioeconomic History  . Marital status: Single    Spouse name: Not on file  . Number of children: 0  . Years of education: 47  . Highest education level: Not on file  Occupational History  . Occupation: Research scientist (physical sciences)  Social Needs  . Financial resource strain: Not on file  . Food insecurity:    Worry: Not on file    Inability: Not on file  . Transportation needs:    Medical: Not on file    Non-medical: Not on file  Tobacco Use  . Smoking status: Former Smoker    Packs/day: 0.50    Years: 20.00    Pack years: 10.00    Types: Cigarettes    Start date: 04/30/2012    Last attempt to quit: 08/16/2012    Years since quitting: 6.3  . Smokeless tobacco: Never Used  Substance and Sexual Activity  . Alcohol use: No    Alcohol/week: 0.0 standard drinks  . Drug use: No  . Sexual activity: Never    Comment: was smoking 1/2 pppd  Lifestyle  . Physical activity:    Days per week: Not on file    Minutes per session: Not on file  . Stress: Not on file  Relationships  . Social connections:    Talks on phone: Not on file    Gets together: Not on file    Attends religious service: Not on file    Active member of club or organization: Not on file    Attends  meetings of clubs or organizations: Not on file    Relationship status: Not on file  . Intimate partner violence:    Fear of current or ex partner: Not on file    Emotionally abused: Not on file    Physically abused: Not on file    Forced sexual activity: Not on file  Other Topics Concern  . Not on file  Social History Narrative   Lives at home alone.   Right-handed.   2 cups caffeine per day.    ROS Review of Systems  Constitutional: Negative for activity change, appetite change, chills and fever.  HENT: Negative for congestion, nosebleeds, trouble swallowing and voice change.  Respiratory: Negative for cough, shortness of breath and wheezing.   Gastrointestinal: Negative for diarrhea, nausea and vomiting.  Genitourinary: Negative for difficulty urinating, dysuria, flank pain and hematuria.  Musculoskeletal: Negative for back pain, joint swelling and neck pain.  Neurological: Negative for dizziness, speech difficulty, light-headedness and numbness.  See HPI. All other review of systems negative.    Objective   Vitals as reported by the patient: There were no vitals filed for this visit.  Diagnoses and all orders for this visit:  RLS (restless legs syndrome)-  Description suggestive of restless leg syndrome Advised pt to try an increase in gabapentin  To also continue supportive care Plan to check ferritin at next visit  Rate of speech, slow -  Continues to have word finding difficulty Pt still at baseline She sees Neurology  Controlled type 2 diabetes mellitus with diabetic nephropathy, without long-term current use of insulin (HCC) -  Stable, cpm  Essential hypertension Continue current meds, continue DASH diet Other orders -     gabapentin (NEURONTIN) 300 MG capsule; Take 1 capsule (300 mg total) by mouth 3 (three) times daily.     I discussed the assessment and treatment plan with the patient. The patient was provided an opportunity to ask questions and all  were answered. The patient agreed with the plan and demonstrated an understanding of the instructions.   The patient was advised to call back or seek an in-person evaluation if the symptoms worsen or if the condition fails to improve as anticipated.  I provided 25 minutes of non-face-to-face time during this encounter.  Damarian Priola A [facility]alliDor[facility]

## 2018-12-11 NOTE — Patient Instructions (Signed)
° ° ° °  If you have lab work done today you will be contacted with your lab results within the next 2 weeks.  If you have not heard from us then please contact us. The fastest way to get your results is to register for My Chart. ° ° °IF you received an x-ray today, you will receive an invoice from [facility]. Please contact  Radiology at [phone] with questions or concerns regarding your invoice.  ° °IF you received labwork today, you will receive an invoice from [facility]. Please contact [facility] at [phone] with questions or concerns regarding your invoice.  ° °Our billing staff will not be able to assist you with questions regarding bills from these companies. ° °You will be contacted with the lab results as soon as they are available. The fastest way to get your results is to activate your My Chart account. Instructions are located on the last page of this paperwork. If you have not heard from us regarding the results in 2 weeks, please contact this office. °  ° ° ° °

## 2018-12-11 NOTE — Progress Notes (Signed)
CC: 3 month recheck blood pressure.  No concerns.  No travel outside the [place] or [place] in the past 3 weeks.  No recent weight or bp taken per pt.

## 2019-01-04 ENCOUNTER — Other Ambulatory Visit: Payer: Self-pay | Admitting: Family Medicine

## 2019-01-08 ENCOUNTER — Encounter: Payer: Self-pay | Admitting: Adult Health

## 2019-01-08 ENCOUNTER — Telehealth: Payer: Self-pay | Admitting: *Deleted

## 2019-01-08 NOTE — Telephone Encounter (Signed)
I called pt. Due to current COVID 19 pandemic, our office is severely reducing in office visits until further notice, in order to minimize the risk to our patients and healthcare providers. She lives alone, she states that she has no camera, but phone.  She consented to Telephone visit. I will have her email me with questions she as.  (gave her my email).

## 2019-01-11 ENCOUNTER — Other Ambulatory Visit: Payer: Self-pay

## 2019-01-11 ENCOUNTER — Encounter: Payer: Self-pay | Admitting: Adult Health

## 2019-01-11 ENCOUNTER — Ambulatory Visit (INDEPENDENT_AMBULATORY_CARE_PROVIDER_SITE_OTHER): Payer: Medicare Other | Admitting: Adult Health

## 2019-01-11 DIAGNOSIS — Z9989 Dependence on other enabling machines and devices: Secondary | ICD-10-CM

## 2019-01-11 DIAGNOSIS — G3101 Pick's disease: Secondary | ICD-10-CM | POA: Diagnosis not present

## 2019-01-11 DIAGNOSIS — G4733 Obstructive sleep apnea (adult) (pediatric): Secondary | ICD-10-CM | POA: Diagnosis not present

## 2019-01-11 DIAGNOSIS — F0280 Dementia in other diseases classified elsewhere without behavioral disturbance: Secondary | ICD-10-CM | POA: Diagnosis not present

## 2019-01-11 NOTE — Progress Notes (Addendum)
  [facility] [address] [place]. [place] [address] [phone]     Virtual Visit via Telephone Note  I connected with Meghan Hickman on 01/11/19 at  8:00 AM EDT by telephone located remotely at [facility] and verified that I am speaking with the correct person using two identifiers who reports being located at home.    Visit scheduled by RN. She discussed the limitations, risks, security and privacy concerns of performing an evaluation and management service by telephone and the availability of in person appointments. I also discussed with the patient that there may be a patient responsible charge related to this service. The patient expressed understanding and agreed to proceed. See telephone note for consent and additional scheduling information.    History of Present Illness:  Meghan Hickman is a 68 y.o. female who has been followed in this office for primary progressive aphasia and obstructive sleep apnea on CPAP was initially scheduled for face-to-face office follow up visit today time but due to COVID19, visit rescheduled for non-face-to-face telephone visit with patients consent. Unable to participate in video visit due to lack of access to device with camera.    Meghan Hickman is a 68 year old female with a history of obstructive sleep apnea on CPAP and primary progressive aphasia.  The patient states that she has not been using the CPAP regularly.  She states that the mask sometimes presses on her sinuses making it uncomfortable.  Se also states that there are times that she falls asleep on the couch without putting the CPAP on.  She states that her speech continues to get worse.  She was at home alone.  She is able to complete all ADLs independently.  She operates a Librarian, academic without difficulty.  She denies any new issues.  She does state that in the past she went to speech therapy and would like to do this again.    Observations/Objective:  Generalized: Well developed, in no acute distress   Neurological examination  Mentation: Alert oriented to time, place, history taking. Speech and language fluent  Assessment and Plan:  1. OSA on CPAP 2.  Primary progressive aphasia  The patient CPAP download shows that she has not been using her machine.  I have advised that she should make sure her mask straps were not too tight.  If this does not make it more comfortable we can send her for mask refitting.  She will continue on Aricept.  I will put an order in for speech therapy.  She is advised that if her symptoms worsen or she develops new symptoms she should let us know.  She will follow-up in 6 months or sooner if needed.   Follow Up Instructions:  FU 6 months      I discussed the assessment and treatment plan with the patient.  The patient was provided an opportunity to ask questions and all were answered to their satisfaction. The patient agreed with the plan and verbalized an understanding of the instructions.   I provided 25 minutes of non-face-to-face time during this encounter.    Butch Penny NP-C [facility] [address] [place], [place] [address]  Phone [phone] Fax [phone]  I reviewed the above note and documentation by the Nurse Practitioner and agree with the history, exam, assessment and plan as outlined above. I was immediately available for consultation. Huston Foley, MD, PhD [facility])

## 2019-01-11 NOTE — Addendum Note (Signed)
Addended by: Enedina Finner on: 01/11/2019 10:17 AM   Modules accepted: Orders

## 2019-01-12 NOTE — Progress Notes (Signed)
I have reviewed and agreed above plan. 

## 2019-02-01 ENCOUNTER — Ambulatory Visit: Payer: Medicare Other | Attending: Adult Health | Admitting: Speech Pathology

## 2019-02-01 ENCOUNTER — Other Ambulatory Visit: Payer: Self-pay

## 2019-02-01 DIAGNOSIS — G3101 Pick's disease: Secondary | ICD-10-CM | POA: Insufficient documentation

## 2019-02-01 DIAGNOSIS — R482 Apraxia: Secondary | ICD-10-CM | POA: Diagnosis present

## 2019-02-01 DIAGNOSIS — F0280 Dementia in other diseases classified elsewhere without behavioral disturbance: Secondary | ICD-10-CM

## 2019-02-05 NOTE — Therapy (Signed)
[facility] [address] [place], [place], [address] Phone: [phone]   Fax:  [phone]  Speech Language Pathology Evaluation  Patient Details  Name: Meghan GardenerLinda M Tatar MRN: [id number] Date of Birth: [birth year] Referring Provider (SLP): Butch PennyMillikan, Megan, NP   Encounter Date: 02/01/2019  End of Session -  02/01/19 1400   Visit Number  1    Number of Visits  17    Date for SLP Re-Evaluation  04/02/19    Authorization Type  BCBS, Medicare part A only    SLP Start Time  1400    SLP Stop Time   1445    SLP Time Calculation (min)  45 min    Activity Tolerance  Patient tolerated treatment well       Past Medical History:  Diagnosis Date  . Arthritis   . Depression   . Diabetes mellitus without complication (HCC)   . Diverticulitis   . GERD (gastroesophageal reflux disease)   . Headache   . Heart murmur   . Hyperlipidemia   . Hypertension   . Speech problem   . Wears glasses     Past Surgical History:  Procedure Laterality Date  . CHOLECYSTECTOMY N/A 12/05/2014   Procedure: LAPAROSCOPIC CHOLECYSTECTOMY;  Surgeon: Emelia LoronMatthew Wakefield, MD;  [facility];  Service: General;  Laterality: N/A;  . SHOULDER ARTHROSCOPY WITH SUBACROMIAL DECOMPRESSION AND BICEP TENDON REPAIR Right 04/23/2013   Procedure: SHOULDER ARTHROSCOPY WITH SUBACROMIAL DECOMPRESSION AND BICEP TENDON TENOTOMY;  Surgeon: Mable ParisJustin William Chandler, MD;  Location: [facility];  Service: Orthopedics;  Laterality: Right;    There were no vitals filed for this visit.  Subjective Assessment - 02/01/19 1400   Subjective  "Is he... hur... here?"    Currently in Pain?  No/denies         SLP Evaluation [facility] -  02/01/19 1400      SLP Visit Information   SLP Received On  02/01/19    Referring Provider (SLP)  Butch PennyMillikan, Megan, NP    Onset Date  02/15/18   PPA dx date   Medical Diagnosis  Primary Progressive Aphasia      Subjective    Subjective  alert, pleasant    Patient/Family Stated Goal  "Find the way to get my thoughts out."      Pain Assessment   Currently in Pain?  No/denies      General Information   HPI  Meghan MorganLinda Goodhart is a 68 y.o. female referred by Butch PennyMegan Millikan, NP for primary progressive aphasia. She is known to our clinic from prior course of therapy 10/12/17-12/08/17  for moderate apraxia of speech. Onset of speech difficulty was approximately 3 years ago; pt [facility]. Last MRI brain was 09/20/17, showed Mild scattered periventricular and 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     Behavioral/Cognition  cooperative, alert    Mobility Status  ambulated to session      Balance Screen   Has the patient fallen in the past 6 months  No    Has the patient had a decrease in activity level because of a fear of falling?   No    Is the patient reluctant to leave their home because of a fear of falling?   No      Prior Functional Status   Cognitive/Linguistic Baseline  Baseline deficits    Baseline deficit details  moderate apraxia of speech    Type of Home  House     Lives With  Alone    Available Support  Friend(s);Available PRN/intermittently    Vocation  Retired      CopyCognition   Overall Cognitive Status  Within Functional Limits for tasks assessed      Auditory Comprehension   Overall Auditory Comprehension  Appears within functional limits for tasks assessed    Yes/No Questions  Within Functional Limits    Commands  Within Functional Limits    Conversation  Moderately complex      Visual Recognition/Discrimination   Discrimination  Within Function Limits      Reading Comprehension   Reading Status  Not tested      Expression   Primary Mode of Expression  Verbal      Verbal Expression   Overall Verbal Expression  Impaired at baseline    Initiation  No impairment    Automatic Speech  Social Response;Name    Level of Generative/Spontaneous  Verbalization  Conversation    Repetition  Impaired    Level of Impairment  Word level   verbal apraxia significant contributor   Naming  Impairment    Responsive  76-100% accurate   90%   Confrontation  75-100% accurate   93%; verbal apraxia contributes though occasional anomia   Divergent  50-74% accurate   11 animals in 60 seconds   Verbal Errors  Semantic paraphasias;Phonemic paraphasias;Aware of errors;Not aware of errors;Inconsistent    Pragmatics  No impairment    Effective Techniques  Sentence completion;Articulatory cues    Non-Verbal Means of Communication  Not applicable    Other Verbal Expression Comments  Picture description with significant word-finding difficulty and hesitations. Apraxic errors prominent (kaik/kite, logu/little, piktnik/picnic, kur/car), though paraphasias (doll/dog, car/house) also noted.          Written Expression   Dominant Hand  Right    Written Expression  Not tested      Oral Motor/Sensory Function   Overall Oral Motor/Sensory Function  Appears within functional limits for tasks assessed      Motor Speech   Overall Motor Speech  Impaired at baseline    Respiration  Within functional limits    Phonation  Normal    Resonance  Within functional limits    Articulation  Impaired    Level of Impairment  Word    Intelligibility  Intelligibility reduced    Word  75-100% accurate    Conversation  75-100% accurate   ~85% if using slow rate   Motor Planning  Impaired    Level of Impairment  Word    Motor Speech Errors  Groping for words;Inconsistent   vowel distortions, phonemic errors   Effective Techniques  Slow rate    Phonation  WFL      Standardized Assessments   Standardized Assessments   Western Aphasia Battery revised    Western Aphasia Battery revised   Spontaneous speech 14/20, Auditory verbal comprehension 10/10, repetition 5.8/20, Naming and wordfinding 8.4/10   Aphasia Quotient 76.4 (76 and above is mild)                      SLP Education -  02/01/19 1400   Education Details  proposed therapy goals, consideration of SGD    Person(s) Educated  Patient    Methods  Explanation;Demonstration    Comprehension  Verbalized understanding       SLP Short Term Goals -  02/01/19 1400     SLP SHORT TERM GOAL #1  Title  Pt will retrieve 9/10 personally relevant words/images on AAC system in response to open ended questions with rare min A x 3 sessions    Time  4    Period  Weeks    Status  New      SLP SHORT TERM GOAL #2   Title  pt will use scripting or SGD to successfully convey her food order in role-play scenario x3 sessions.    Time  4    Period  Weeks    Status  New      SLP SHORT TERM GOAL #3   Title  Pt will use compensations for anomia/apraxia in 5 minutes simple conversation 80% of the time over 3 sessions.    Time  4    Period  Weeks    Status  New       SLP Long Term Goals -  02/01/19 1400      SLP LONG TERM GOAL #1   Title  Pt will relay personal or medical information in functional scenarios (using scripting or SGD if necessary) x3 sessions.    Time  8    Period  Weeks    Status  New      SLP LONG TERM GOAL #2   Title  pt will report success when ordering food on the phone or at the drive through using script or SGD x 3 sessions    Time  8    Period  Weeks    Status  New      SLP LONG TERM GOAL #3   Title  pt will demo 10 minutes functional simple to mod complex conversation with modified independence (using compensations and/or SGD) over three sessions    Time  8    Period  Weeks    Status  New       Plan - 02/01/19 1400   Clinical Impression Statement  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 Pt known to our clinic from prior course of ST for verbal apraxia. Pt was subsequently [facility]. Pt reports increasing  difficulties with her speech, and has noticed she has trouble thinking of words at times. She feels this is new since her prior course of therapy. Scores on Western Aphasia Battery-Revised are most consistent with conduction-type aphasia, however this SLP feels presentation most closely resembles anomic aphasia, as repetition score is significantly impacted by verbal apraxia. Pt's aphasia quotient is 76.4, indicating mild severity (76 and above is mild). Pt reports difficulty communicating medical/personal information and ordering at a drive-through, particularly when under pressure. Pt acknowledges relief with recent stay-at-home orders due to having fewer social interactions: "I love it... but it's not good." I recommend skilled ST for training in compensations for anomia and verbal apraxia, and for possible training in use of a speech-generating device given diagnosis of progressive neurologic disease.    Speech Therapy Frequency  2x / week    Duration  --   8 weeks or 17 visits   Treatment/Interventions  Cueing hierarchy;Functional tasks;Patient/family education;Environmental controls;Cognitive reorganization;Multimodal communcation approach;Language facilitation;Compensatory techniques;Internal/external aids;SLP instruction and feedback    Potential to Achieve Goals  Good    Consulted and Agree with Plan of Care  Patient       Patient will benefit from skilled therapeutic intervention in order to improve the following deficits and impairments:   1. Primary progressive  aphasia ([place])   2. Verbal apraxia       Problem List Patient Active Problem List   Diagnosis Date Noted  . Primary progressive aphasia ([place]) 05/25/2018  . Expressive aphasia 09/15/2017  . Migraine 10/13/2016  . Insomnia 10/13/2016  . Essential hypertension 10/13/2016  . Moderate episode of recurrent major depressive disorder ([place]) 05/12/2016  . Small vessel disease, cerebrovascular 12/22/2015  . Slurred speech 07/24/2015   . Abnormal CT of brain 07/08/2015  . Speech abnormality 06/20/2015  . Thyroid nodule 06/20/2015  . S/P laparoscopic cholecystectomy 12/05/2014  . Heart murmur 02/11/2013  . Type 2 diabetes mellitus, controlled ([place]) 02/09/2013  . Other and unspecified hyperlipidemia 02/09/2013   Deneise Lever, [place], [place] 02/05/2019, 10:57 AM  [facility] [address] [place], [place], [address] Phone: [phone]   Fax:  [phone]  Name: MALISSIA RABBANI MRN: [id number] Date of Birth: [birth year]

## 2019-02-06 ENCOUNTER — Ambulatory Visit: Payer: Medicare Other | Admitting: Speech Pathology

## 2019-02-06 ENCOUNTER — Other Ambulatory Visit: Payer: Self-pay

## 2019-02-06 DIAGNOSIS — R482 Apraxia: Secondary | ICD-10-CM

## 2019-02-06 DIAGNOSIS — G3101 Pick's disease: Secondary | ICD-10-CM | POA: Diagnosis not present

## 2019-02-06 DIAGNOSIS — F0280 Dementia in other diseases classified elsewhere without behavioral disturbance: Secondary | ICD-10-CM

## 2019-02-06 NOTE — Patient Instructions (Signed)
Speech practice: practice each script 5x, twice a day  1. [employer]   I want an / Egg McMuffin/ Combo / Senior coffee/ Cream and sugar/ On the side.                     (Mick-Muff-in)              (Seen-yer coffee) 2. [employer]   I want a / 4 piece / Chick-en Sup-remes / Com-bo /with sweet tea.   Please write down the foods you like to order at other restaurants. We will work on making a script and eventually try adding them to the communication device when it comes in. Bring this list and the list of the restaurants/places you go frequently.

## 2019-02-06 NOTE — Therapy (Signed)
[facility] [address] [place], [place], [address] Phone: [phone]   Fax:  [phone]  Speech Language Pathology Treatment  Patient Details  Name: Meghan GardenerLinda M Hickman MRN: [id number] Date of Birth: 07/05/1951 Referring Provider (SLP): Butch PennyMillikan, Megan, NP   Encounter Date: 02/06/2019  End of Session - 02/06/19 1459    Visit Number  2    Number of Visits  17    Date for SLP Re-Evaluation  04/02/19    Authorization Type  BCBS, Medicare part A only    SLP Start Time  1402    SLP Stop Time   1446    SLP Time Calculation (min)  44 min    Activity Tolerance  Patient tolerated treatment well       Past Medical History:  Diagnosis Date  . Arthritis   . Depression   . Diabetes mellitus without complication (HCC)   . Diverticulitis   . GERD (gastroesophageal reflux disease)   . Headache   . Heart murmur   . Hyperlipidemia   . Hypertension   . Speech problem   . Wears glasses     Past Surgical History:  Procedure Laterality Date  . CHOLECYSTECTOMY N/A 12/05/2014   Procedure: LAPAROSCOPIC CHOLECYSTECTOMY;  Surgeon: Emelia LoronMatthew Wakefield, MD;  [facility];  Service: General;  Laterality: N/A;  . SHOULDER ARTHROSCOPY WITH SUBACROMIAL DECOMPRESSION AND BICEP TENDON REPAIR Right 04/23/2013   Procedure: SHOULDER ARTHROSCOPY WITH SUBACROMIAL DECOMPRESSION AND BICEP TENDON TENOTOMY;  Surgeon: Mable ParisJustin William Chandler, MD;  Location: [facility];  Service: Orthopedics;  Laterality: Right;    There were no vitals filed for this visit.  Subjective Assessment - 02/06/19 1410    Subjective  "I'm-sorry-I-over-uh-slept. I-woke-up-eight-eight-fifteen."    Currently in Pain?  No/denies            ADULT SLP TREATMENT - 02/06/19 1452      General Information   Behavior/Cognition  Alert;Cooperative;Pleasant mood      Treatment Provided   Treatment provided  Cognitive-Linquistic      Pain Assessment   Pain Assessment  No/denies pain      Cognitive-Linquistic Treatment   Treatment focused on  Aphasia;Apraxia    Skilled Treatment  SLP reviewed plan of care and proposed therapy goals. Provided information from [employer] re: anticipated OOP cost for SGD if pt opts to pursue after free trial period. Also educated that financial assistance may be available if she is unable to cover these costs. SLP had pt generate list of restaurants and places in the community she frequents. Pt attempted to tell SLP location when she could not think of the name. Pt with written paraphasias: "[address]" vs [address] and "mf miff"/ mcmuffin. SLP printed a map of area pt frequents for groceries, restaurants and pt used this map to add locations to her list. Pt reports she avoids [employer] for breakfast because she can't order "mcmuffin." She reports she used to write it on a paper but hasn't been able to go inside the restaurant. SLP worked with pt on scripting for orders at [employer] and [employer]. With slow rate, pausing, and written cues, pt achieved 90% success with fading cues from SLP. Pt to practice these scripts at home and to generate list of additional orders for scripting/programming SGD.      Assessment / Recommendations / Plan   Plan  Continue with current plan of care      Progression Toward Goals   Progression toward  goals  Progressing toward goals         SLP Short Term Goals - 02/06/19 1500      SLP SHORT TERM GOAL #1   Title  Pt will retrieve 9/10 personally relevant words/images on AAC system in response to open ended questions with rare min A x 3 sessions    Time  4    Period  Weeks    Status  On-going      SLP SHORT TERM GOAL #2   Title  pt will use scripting or SGD to successfully convey her food order in role-play scenario x3 sessions.    Time  4    Period  Weeks    Status  On-going      SLP SHORT TERM GOAL #3   Title  Pt will use compensations for  anomia/apraxia in 5 minutes simple conversation 80% of the time over 3 sessions.    Time  4    Period  Weeks    Status  On-going       SLP Long Term Goals - 02/06/19 1502      SLP LONG TERM GOAL #1   Title  Pt will relay personal or medical information in functional scenarios (using scripting or SGD if necessary) x3 sessions.    Time  8    Period  Weeks    Status  On-going      SLP LONG TERM GOAL #2   Title  pt will report success when ordering food on the phone or at the drive through using script or SGD x 3 sessions    Time  8    Period  Weeks    Status  On-going      SLP LONG TERM GOAL #3   Title  pt will demo 10 minutes functional simple to mod complex conversation with modified independence (using compensations and/or SGD) over three sessions    Time  8    Period  Weeks    Status  On-going       Plan - 02/06/19 1500    Clinical Impression Statement  Ms. 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 SLP feels presentation most closely resembles anomic aphasia. Verbal apraxia, as well as verbal and written paraphasias noted today. Pt reports difficulty communicating medical/personal information and ordering at a drive-through, particularly when under pressure. Pt acknowledges relief with recent stay-at-home orders due to having fewer social interactions: "I love it... but it's not good." I recommend skilled ST for training in compensations for anomia and verbal apraxia, and for possible training in use of a speech-generating device given diagnosis of progressive neurologic disease.    Speech Therapy Frequency  2x / week    Duration  --   8 weeks or 17 visits   Treatment/Interventions  Cueing hierarchy;Functional tasks;Patient/family education;Environmental controls;Cognitive reorganization;Multimodal communcation approach;Language facilitation;Compensatory techniques;Internal/external aids;SLP instruction and  feedback    Potential to Achieve Goals  Good    Consulted and Agree with Plan of Care  Patient       Patient will benefit from skilled therapeutic intervention in order to improve the following deficits and impairments:   1. Primary progressive aphasia ([place])   2. Verbal apraxia       Problem List Patient Active Problem List   Diagnosis Date Noted  . Primary progressive aphasia ([place]) 05/25/2018  . Expressive aphasia 09/15/2017  . Migraine 10/13/2016  . Insomnia 10/13/2016  .  Essential hypertension 10/13/2016  . Moderate episode of recurrent major depressive disorder (HCC) 05/12/2016  . Small vessel disease, cerebrovascular 12/22/2015  . Slurred speech 07/24/2015  . Abnormal CT of brain 07/08/2015  . Speech abnormality 06/20/2015  . Thyroid nodule 06/20/2015  . S/P laparoscopic cholecystectomy 12/05/2014  . Heart murmur 02/11/2013  . Type 2 diabetes mellitus, controlled (HCC) 02/09/2013  . Other and unspecified hyperlipidemia 02/09/2013   Rondel BatonMary Beth Lylee Corrow, MS, CCC-SLP Speech-Language Pathologist  Arlana LindauMary E Demarcus Thielke 02/06/2019, 3:02 PM  [facility] [address] [place], [place], [address] Phone: [phone]   Fax:  [phone]   Name: Meghan GardenerLinda M Muldrow MRN: [id number] Date of Birth: 08-18-1950

## 2019-02-08 ENCOUNTER — Other Ambulatory Visit: Payer: Self-pay

## 2019-02-08 ENCOUNTER — Ambulatory Visit: Payer: Medicare Other | Attending: Adult Health | Admitting: Speech Pathology

## 2019-02-08 DIAGNOSIS — R482 Apraxia: Secondary | ICD-10-CM | POA: Insufficient documentation

## 2019-02-08 DIAGNOSIS — G3101 Pick's disease: Secondary | ICD-10-CM | POA: Diagnosis not present

## 2019-02-08 DIAGNOSIS — F0280 Dementia in other diseases classified elsewhere without behavioral disturbance: Secondary | ICD-10-CM | POA: Insufficient documentation
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
## 2019-02-13 ENCOUNTER — Other Ambulatory Visit: Payer: Self-pay

## 2019-02-13 ENCOUNTER — Ambulatory Visit: Payer: Medicare Other

## 2019-02-13 DIAGNOSIS — R482 Apraxia: Secondary | ICD-10-CM

## 2019-02-13 DIAGNOSIS — G3101 Pick's disease: Secondary | ICD-10-CM | POA: Diagnosis not present

## 2019-02-13 DIAGNOSIS — F0280 Dementia in other diseases classified elsewhere without behavioral disturbance: Secondary | ICD-10-CM
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
## 2019-02-14 ENCOUNTER — Ambulatory Visit: Payer: Medicare Other | Admitting: Speech Pathology

## 2019-02-14 DIAGNOSIS — G3101 Pick's disease: Secondary | ICD-10-CM | POA: Diagnosis not present

## 2019-02-14 DIAGNOSIS — F0280 Dementia in other diseases classified elsewhere without behavioral disturbance: Secondary | ICD-10-CM

## 2019-02-14 DIAGNOSIS — R482 Apraxia: Secondary | ICD-10-CM

## 2019-02-14 NOTE — Therapy (Signed)
[place] [address] [place], [place], [address] Phone: [phone]   Fax:  [phone]  Speech Language Pathology Treatment  Patient Details  Name: Meghan Hickman MRN: [id number] Date of Birth: May 19, 1951 Referring Provider (SLP): Ward Givens, NP   Encounter Date: 02/14/2019  End of Session - 02/14/19 1148    Visit Number  5    Number of Visits  17    Date for SLP Re-Evaluation  04/02/19    Authorization Type  BCBS, Medicare    SLP Start Time  1002    SLP Stop Time   1053    SLP Time Calculation (min)  51 min    Activity Tolerance  Patient tolerated treatment well       Past Medical History:  Diagnosis Date  . Arthritis   . Depression   . Diabetes mellitus without complication ([place])   . Diverticulitis   . GERD (gastroesophageal reflux disease)   . Headache   . Heart murmur   . Hyperlipidemia   . Hypertension   . Speech problem   . Wears glasses     Past Surgical History:  Procedure Laterality Date  . CHOLECYSTECTOMY N/A 12/05/2014   Procedure: LAPAROSCOPIC CHOLECYSTECTOMY;  Surgeon: Rolm Bookbinder, MD;  Location: [place] [facility];  Service: General;  Laterality: N/A;  . SHOULDER ARTHROSCOPY WITH SUBACROMIAL DECOMPRESSION AND BICEP TENDON REPAIR Right 04/23/2013   Procedure: SHOULDER ARTHROSCOPY WITH SUBACROMIAL DECOMPRESSION AND BICEP TENDON TENOTOMY;  Surgeon: Nita Sells, MD;  Location: [place];  Service: Orthopedics;  Laterality: Right;    There were no vitals filed for this visit.  Subjective Assessment - 02/14/19 1005    Subjective  "Still sleepy." Pt was rescheduled to 10am after arriving 25 min late for 8am appt.    Currently in Pain?  No/denies            ADULT SLP TREATMENT - 02/14/19 1148      General Information   Behavior/Cognition  Alert;Cooperative;Pleasant mood      Treatment Provided   Treatment provided  Cognitive-Linquistic      Pain  Assessment   Pain Assessment  No/denies pain      Cognitive-Linquistic Treatment   Treatment focused on  Aphasia;Apraxia    Skilled Treatment  Pt independently navigated to show SLP new icons in "Hobbies" section re: gardening. She reports she used written notes to order at [facility]. SLP suggested using device to order food, and pt agreed this would be helpful. She changed icons including text and photos to create her order with occasional min A. SLP demo'd adding new icons, and pt replicated this procedure with occasional min A to add additional order items to [employer] section, using internet search for photos. SLP also demo'd search feature to add common items, which pt then used to add ketchup, cream, sugar, "to go". Pt to add orders for other favorite restaurants at home.       Assessment / Recommendations / Plan   Plan  Continue with current plan of care      Progression Toward Goals   Progression toward goals  Progressing toward goals       SLP Education - 02/14/19 1148    Education Details  use device to order at [employer]) Educated  Patient    Methods  Explanation;Demonstration    Comprehension  Verbalized understanding       SLP Short Term Goals - 02/14/19 1149  SLP SHORT TERM GOAL #1   Title  Pt will retrieve 9/10 personally relevant words/images on AAC system in response to open ended questions with rare min A x 3 sessions    Time  3    Period  Weeks    Status  On-going      SLP SHORT TERM GOAL #2   Title  pt will use scripting or SGD to successfully convey her food order in role-play scenario x3 sessions.    Time  3    Period  Weeks    Status  On-going      SLP SHORT TERM GOAL #3   Title  Pt will use compensations for anomia/apraxia in 5 minutes simple conversation 80% of the time over 3 sessions.    Time  3    Period  Weeks    Status  On-going       SLP Long Term Goals - 02/14/19 1149      SLP LONG TERM GOAL #1   Title  Pt will relay  personal or medical information in functional scenarios (using scripting or SGD if necessary) x3 sessions.    Time  7    Period  Weeks    Status  On-going      SLP LONG TERM GOAL #2   Title  pt will report success when ordering food on the phone or at the drive through using script or SGD x 3 sessions    Time  7    Period  Weeks    Status  On-going      SLP LONG TERM GOAL #3   Title  pt will demo 10 minutes functional simple to mod complex conversation with modified independence (using compensations and/or SGD) over three sessions    Time  7    Period  Weeks    Status  On-going       Plan - 02/14/19 1149    Clinical Impression Statement  Ms. 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 SLP feels presentation most closely resembles anomic aphasia. Verbal apraxia, as well as verbal and written paraphasias noted today. Pt reports difficulty communicating medical/personal information and ordering at a drive-through, particularly when under pressure. Pt acknowledges relief with recent stay-at-home orders due to having fewer social interactions: "I love it... but it's not good." I recommend skilled ST for training in compensations for anomia and verbal apraxia, and for possible training in use of a speech-generating device given diagnosis of progressive neurologic disease.    Speech Therapy Frequency  2x / week    Duration  --   8 weeks or 17 visits   Treatment/Interventions  Cueing hierarchy;Functional tasks;Patient/family education;Environmental controls;Cognitive reorganization;Multimodal communcation approach;Language facilitation;Compensatory techniques;Internal/external aids;SLP instruction and feedback    Potential to Achieve Goals  Good    Consulted and Agree with Plan of Care  Patient       Patient will benefit from skilled therapeutic intervention in order to improve the following deficits and impairments:   1.  Primary progressive aphasia (HCC)   2. Verbal apraxia       Problem List Patient Active Problem List   Diagnosis Date Noted  . Primary progressive aphasia (HCC) 05/25/2018  . Expressive aphasia 09/15/2017  . Migraine 10/13/2016  . Insomnia 10/13/2016  . Essential hypertension 10/13/2016  . Moderate episode of recurrent major depressive disorder (HCC) 05/12/2016  . Small vessel disease, cerebrovascular 12/22/2015  . Slurred speech  07/24/2015  . Abnormal CT of brain 07/08/2015  . Speech abnormality 06/20/2015  . Thyroid nodule 06/20/2015  . S/P laparoscopic cholecystectomy 12/05/2014  . Heart murmur 02/11/2013  . Type 2 diabetes mellitus, controlled (HCC) 02/09/2013  . Other and unspecified hyperlipidemia 02/09/2013   Rondel BatonMary Beth Lin Glazier, MS, CCC-SLP Speech-Language Pathologist  Arlana LindauMary E Stefanos Haynesworth 02/14/2019, 11:50 AM  [facility] [address] [place], [place], [address] Phone: [phone]   Fax:  [phone]   Name: Michelene GardenerLinda M Caul MRN: [id number] Date of Birth: Jan 06, 1951

## 2019-02-14 NOTE — Patient Instructions (Signed)
Go to Food --> Restaurants---> add your order for [employer], [employer], [employer] Brow[employer], [employer], and [employer] in the individual folders.  Delete icons from restaurants that you don't go to.   Add new icons for restaurants you visit that aren't listed.   Use the yellow camera icon at the top of the screen to take some photos. Some ideas: your garden/plants. You can take a picture of family photos so that we can add icons for your family members.

## 2019-02-16 ENCOUNTER — Ambulatory Visit: Payer: Medicare Other

## 2019-02-20 ENCOUNTER — Ambulatory Visit: Payer: Medicare Other | Admitting: Speech Pathology

## 2019-02-20 ENCOUNTER — Other Ambulatory Visit: Payer: Self-pay

## 2019-02-20 DIAGNOSIS — G3101 Pick's disease: Secondary | ICD-10-CM

## 2019-02-20 DIAGNOSIS — F0280 Dementia in other diseases classified elsewhere without behavioral disturbance: Secondary | ICD-10-CM

## 2019-02-20 DIAGNOSIS — R482 Apraxia: Secondary | ICD-10-CM

## 2019-02-20 NOTE — Therapy (Signed)
[facility] [address] [place], [place], [address] Phone: [phone]   Fax:  [phone]  Speech Language Pathology Treatment  Patient Details  Name: Meghan GardenerLinda M Hickman MRN: [id number] Date of Birth: 1950/11/27 Referring Provider (SLP): Butch PennyMillikan, Megan, NP   Encounter Date: 02/20/2019  End of Session - 02/20/19 1401    Visit Number  6    Number of Visits  17    Date for SLP Re-Evaluation  04/02/19    SLP Start Time  1011    SLP Stop Time   1050    SLP Time Calculation (min)  39 min    Activity Tolerance  Patient tolerated treatment well       Past Medical History:  Diagnosis Date  . Arthritis   . Depression   . Diabetes mellitus without complication (HCC)   . Diverticulitis   . GERD (gastroesophageal reflux disease)   . Headache   . Heart murmur   . Hyperlipidemia   . Hypertension   . Speech problem   . Wears glasses     Past Surgical History:  Procedure Laterality Date  . CHOLECYSTECTOMY N/A 12/05/2014   Procedure: LAPAROSCOPIC CHOLECYSTECTOMY;  Surgeon: Emelia LoronMatthew Wakefield, MD;  [facility];  Service: General;  Laterality: N/A;  . SHOULDER ARTHROSCOPY WITH SUBACROMIAL DECOMPRESSION AND BICEP TENDON REPAIR Right 04/23/2013   Procedure: SHOULDER ARTHROSCOPY WITH SUBACROMIAL DECOMPRESSION AND BICEP TENDON TENOTOMY;  Surgeon: Mable ParisJustin William Chandler, MD;  Location: [facility];  Service: Orthopedics;  Laterality: Right;    There were no vitals filed for this visit.  Subjective Assessment - 02/20/19 1014    Subjective  "I dropped it. It's working."            ADULT SLP TREATMENT - 02/20/19 1401      General Information   Behavior/Cognition  Alert;Cooperative;Pleasant mood      Treatment Provided   Treatment provided  Cognitive-Linquistic      Pain Assessment   Pain Assessment  No/denies pain      Cognitive-Linquistic Treatment   Treatment focused on  Aphasia;Apraxia    Skilled Treatment  Pt 10 minutes late. She attempted to show SLP additions she made to "restaurants," however unable to find her orders. After several minutes, pt accessed "edit" icon feature and SLP noted she had typed her orders in the text box for each folder rather than generating new icons. SLP had pt add icons to each folder with her correct order. Occasional mod A initially, fading to rare min A. Pt did not use device to order at [facility] as discussed. [facility] to [facility]. Pt to continue with device customization at home.      Assessment / Recommendations / Plan   Plan  Continue with current plan of care      Progression Toward Goals   Progression toward goals  Progressing toward goals         SLP Short Term Goals - 02/20/19 1403      SLP SHORT TERM GOAL #1   Title  Pt will retrieve 9/10 personally relevant words/images on AAC system in response to open ended questions with rare min A x 3 sessions    Time  2    Period  Weeks    Status  On-going      SLP SHORT TERM GOAL #2   Title  pt will use scripting or SGD to successfully convey her food order in  role-play scenario x3 sessions.    Baseline  02/20/19    Time  2    Period  Weeks    Status  On-going      SLP SHORT TERM GOAL #3   Title  Pt will use compensations for anomia/apraxia in 5 minutes simple conversation 80% of the time over 3 sessions.    Baseline  02/20/19    Time  2    Period  Weeks    Status  On-going       SLP Long Term Goals - 02/20/19 1403      SLP LONG TERM GOAL #1   Title  Pt will relay personal or medical information in functional scenarios (using scripting or SGD if necessary) x3 sessions.    Time  6    Period  Weeks    Status  On-going      SLP LONG TERM GOAL #2   Title  pt will report success when ordering food on the phone or at the drive through using script or SGD x 3 sessions    Time  6    Period  Weeks    Status  On-going      SLP LONG TERM GOAL #3    Title  pt will demo 10 minutes functional simple to mod complex conversation with modified independence (using compensations and/or SGD) over three sessions    Time  6    Period  Weeks    Status  On-going       Plan - 02/20/19 1401    Clinical Impression Statement  Ms. 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 SLP feels presentation most closely resembles anomic aphasia. Verbal apraxia, as well as verbal and written paraphasias noted today. Pt able to change and add icons to SGD today with rare min A. I recommend skilled ST for training in compensations for anomia and verbal apraxia, and for training/carryover in use of a speech-generating device given diagnosis of progressive neurologic disease.    Speech Therapy Frequency  2x / week    Duration  --   8 weeks or 17 visits   Treatment/Interventions  Cueing hierarchy;Functional tasks;Patient/family education;Environmental controls;Cognitive reorganization;Multimodal communcation approach;Language facilitation;Compensatory techniques;Internal/external aids;SLP instruction and feedback    Potential to Achieve Goals  Good    Consulted and Agree with Plan of Care  Patient       Patient will benefit from skilled therapeutic intervention in order to improve the following deficits and impairments:   1. Primary progressive aphasia (HCC)   2. Verbal apraxia       Problem List Patient Active Problem List   Diagnosis Date Noted  . Primary progressive aphasia (HCC) 05/25/2018  . Expressive aphasia 09/15/2017  . Migraine 10/13/2016  . Insomnia 10/13/2016  . Essential hypertension 10/13/2016  . Moderate episode of recurrent major depressive disorder (HCC) 05/12/2016  . Small vessel disease, cerebrovascular 12/22/2015  . Slurred speech 07/24/2015  . Abnormal CT of brain 07/08/2015  . Speech abnormality 06/20/2015  . Thyroid nodule 06/20/2015  . S/P laparoscopic  cholecystectomy 12/05/2014  . Heart murmur 02/11/2013  . Type 2 diabetes mellitus, controlled (HCC) 02/09/2013  . Other and unspecified hyperlipidemia 02/09/2013   Rondel BatonMary Beth Sumie Remsen, MS, CCC-SLP Speech-Language Pathologist  Arlana LindauMary E Jasani Lengel 02/20/2019, 2:04 PM  [facility] [address] [place], [place], [address] Phone: [phone]   Fax:  [phone]   Name: [name]  JENI DULING MRN: [id number] Date of Birth: 1951/07/19

## 2019-02-22 ENCOUNTER — Other Ambulatory Visit: Payer: Self-pay

## 2019-02-22 ENCOUNTER — Ambulatory Visit: Payer: Medicare Other | Admitting: Speech Pathology

## 2019-02-22 DIAGNOSIS — F0280 Dementia in other diseases classified elsewhere without behavioral disturbance: Secondary | ICD-10-CM

## 2019-02-22 DIAGNOSIS — R482 Apraxia: Secondary | ICD-10-CM

## 2019-02-22 DIAGNOSIS — G3101 Pick's disease: Secondary | ICD-10-CM | POA: Diagnosis not present

## 2019-02-22 NOTE — Patient Instructions (Addendum)
Add some more icons to use when Meghan Hickman comes over. Go to People > Friends > Meghan Hickman . When you open the Meghan Hickman folder, add some new icons. Add some questions you'd like to ask her or topics you'd like to discuss.  Take Meghan Hickman's picture with your device!   Try using your device to order food somewhere.  If you are using the [employer] program, make sure you click on "MiniTalk" at the top. This will take you to the instruction video for your device.   We need to return your device to [place] next Thursday. Please bring your paperwork and give it to [facility]. If we feel like the device is a good tool for you, we can save what you've created and add it to the device they ship to you.

## 2019-02-22 NOTE — Therapy (Signed)
[facility] [address] [place], [place], [address] Phone: [phone]   Fax:  [phone]  Speech Language Pathology Treatment  Patient Details  Name: Meghan Hickman MRN: [id number] Date of Birth: 1951/06/10 Referring Provider (SLP): Butch PennyMillikan, Megan, NP   Encounter Date: 02/22/2019  End of Session - 02/22/19 1712    Visit Number  7    Number of Visits  17    Date for SLP Re-Evaluation  04/02/19    Authorization Type  BCBS, Medicare    SLP Start Time  1607   late d/t line at check-in   SLP Stop Time   1658    SLP Time Calculation (min)  51 min    Activity Tolerance  Patient tolerated treatment well       Past Medical History:  Diagnosis Date  . Arthritis   . Depression   . Diabetes mellitus without complication (HCC)   . Diverticulitis   . GERD (gastroesophageal reflux disease)   . Headache   . Heart murmur   . Hyperlipidemia   . Hypertension   . Speech problem   . Wears glasses     Past Surgical History:  Procedure Laterality Date  . CHOLECYSTECTOMY N/A 12/05/2014   Procedure: LAPAROSCOPIC CHOLECYSTECTOMY;  Surgeon: Emelia LoronMatthew Wakefield, MD;  [facility];  Service: General;  Laterality: N/A;  . SHOULDER ARTHROSCOPY WITH SUBACROMIAL DECOMPRESSION AND BICEP TENDON REPAIR Right 04/23/2013   Procedure: SHOULDER ARTHROSCOPY WITH SUBACROMIAL DECOMPRESSION AND BICEP TENDON TENOTOMY;  Surgeon: Mable ParisJustin William Chandler, MD;  Location: [facility];  Service: Orthopedics;  Laterality: Right;    There were no vitals filed for this visit.  Subjective Assessment - 02/22/19 1610    Subjective  "I-were-here. But-the-line."    Currently in Pain?  No/denies            ADULT SLP TREATMENT - 02/22/19 1610      General Information   Behavior/Cognition  Alert;Cooperative;Pleasant mood      Treatment Provided   Treatment provided  Cognitive-Linquistic      Pain Assessment   Pain Assessment   No/denies pain      Cognitive-Linquistic Treatment   Treatment focused on  Aphasia;Apraxia    Skilled Treatment  Pt arrived on time, late check-in due to line. Pt showed SLP pictures she took of her garden. With extended time, pt told SLP about a vine she trained to grow over her kitchen window. Pt created new icon in "Me">"Hobbies" >"Gardening" folder with photo of vine (rare min A) and wrote: "vine in pot. I trained to it grow over the window." Pt has not been out to order food. She did play her fast food order over the phone to her friend: "It was clear. She could understand." She reports her friend Carlo is coming over tomorrow to celebrate her birthday. Pt created icon in People>Friends for Carlo. Added 2 icons for discussion with friend. Occasional min-mod A for sentence structure for first icon, re: return to school orders. For second question about her friend's granddaughter, SLP cued pt to use audio playback feature and with this pt succesful with ID'ing and correcting minor error (her vs she). SLP told pt to add more topics to discuss with Carlo and to use the device with her friend during her visit.       Assessment / Recommendations / Plan   Plan  Continue with current plan of care      Progression Toward Goals  Progression toward goals  Progressing toward goals       SLP Education - 02/22/19 1710    Education Details  for device to be functional, pt needs to start practicing using the device to communicate    Person(s) Educated  Patient    Methods  Explanation    Comprehension  Verbalized understanding       SLP Short Term Goals - 02/22/19 1713      SLP SHORT TERM GOAL #1   Title  Pt will retrieve 9/10 personally relevant words/images on AAC system in response to open ended questions with rare min A x 3 sessions    Baseline  02/22/19 (food orders, hobbies)    Time  2    Period  Weeks    Status  On-going      SLP SHORT TERM GOAL #2   Title  pt will use scripting or SGD to  successfully convey her food order in role-play scenario x3 sessions.    Baseline  02/20/19    Time  2    Period  Weeks    Status  On-going      SLP SHORT TERM GOAL #3   Title  Pt will use compensations for anomia/apraxia in 5 minutes simple conversation 80% of the time over 3 sessions.    Baseline  02/20/19    Time  2    Period  Weeks    Status  On-going       SLP Long Term Goals - 02/22/19 1714      SLP LONG TERM GOAL #1   Title  Pt will relay personal or medical information in functional scenarios (using scripting or SGD if necessary) x3 sessions.    Time  6    Period  Weeks    Status  On-going      SLP LONG TERM GOAL #2   Title  pt will report success when ordering food on the phone or at the drive through using script or SGD x 3 sessions    Time  6    Period  Weeks    Status  On-going      SLP LONG TERM GOAL #3   Title  pt will demo 10 minutes functional simple to mod complex conversation with modified independence (using compensations and/or SGD) over three sessions    Time  6    Period  Weeks    Status  On-going       Plan - 02/22/19 1713    Clinical Impression Statement  Ms. 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 SLP feels presentation most closely resembles anomic aphasia. Verbal apraxia, as well as verbal and written paraphasias noted today. Pt able to change and add icons to SGD today with rare min A. I recommend skilled ST for training in compensations for anomia and verbal apraxia, and for training/carryover in use of a speech-generating device given diagnosis of progressive neurologic disease.    Speech Therapy Frequency  2x / week    Duration  --   8 weeks or 17 visits   Treatment/Interventions  Cueing hierarchy;Functional tasks;Patient/family education;Environmental controls;Cognitive reorganization;Multimodal communcation approach;Language facilitation;Compensatory  techniques;Internal/external aids;SLP instruction and feedback    Potential to Achieve Goals  Good    Consulted and Agree with Plan of Care  Patient       Patient will benefit from skilled therapeutic intervention in order to improve the following deficits and impairments:  1. Primary progressive aphasia (HCC)   2. Verbal apraxia       Problem List Patient Active Problem List   Diagnosis Date Noted  . Primary progressive aphasia (HCC) 05/25/2018  . Expressive aphasia 09/15/2017  . Migraine 10/13/2016  . Insomnia 10/13/2016  . Essential hypertension 10/13/2016  . Moderate episode of recurrent major depressive disorder (HCC) 05/12/2016  . Small vessel disease, cerebrovascular 12/22/2015  . Slurred speech 07/24/2015  . Abnormal CT of brain 07/08/2015  . Speech abnormality 06/20/2015  . Thyroid nodule 06/20/2015  . S/P laparoscopic cholecystectomy 12/05/2014  . Heart murmur 02/11/2013  . Type 2 diabetes mellitus, controlled (HCC) 02/09/2013  . Other and unspecified hyperlipidemia 02/09/2013   Rondel BatonMary Beth Carrel Leather, MS, CCC-SLP Speech-Language Pathologist  Arlana LindauMary E Shawnell Dykes 02/22/2019, 5:15 PM   [facility] [address] [place], [place], [address] Phone: [phone]   Fax:  [phone]   Name: Meghan Hickman MRN: [id number] Date of Birth: 1950/12/14

## 2019-02-27 ENCOUNTER — Other Ambulatory Visit: Payer: Self-pay

## 2019-02-27 ENCOUNTER — Ambulatory Visit: Payer: Medicare Other

## 2019-02-27 DIAGNOSIS — G3101 Pick's disease: Secondary | ICD-10-CM | POA: Diagnosis not present

## 2019-02-27 DIAGNOSIS — F0280 Dementia in other diseases classified elsewhere without behavioral disturbance: Secondary | ICD-10-CM

## 2019-02-27 DIAGNOSIS — R482 Apraxia: Secondary | ICD-10-CM

## 2019-02-27 NOTE — Therapy (Signed)
[place] [address] [place], [place], [address] Phone: [phone]   Fax:  [phone]  Speech Language Pathology Treatment  Patient Details  Name: Meghan Hickman MRN: [id number] Date of Birth: [birth year] Referring Provider (SLP): Meghan Givens, NP   Encounter Date: 02/27/2019  End of Session - 02/27/19 1558    Visit Number  8    Number of Visits  17    Date for SLP Re-Evaluation  04/02/19    SLP Start Time  55    SLP Stop Time   1546    SLP Time Calculation (min)  36 min    Activity Tolerance  Patient tolerated treatment well       Past Medical History:  Diagnosis Date  . Arthritis   . Depression   . Diabetes mellitus without complication ([place])   . Diverticulitis   . GERD (gastroesophageal reflux disease)   . Headache   . Heart murmur   . Hyperlipidemia   . Hypertension   . Speech problem   . Wears glasses     Past Surgical History:  Procedure Laterality Date  . CHOLECYSTECTOMY N/A 12/05/2014   Procedure: LAPAROSCOPIC CHOLECYSTECTOMY;  Surgeon: Rolm Bookbinder, MD;  Location: [place];  Service: General;  Laterality: N/A;  . SHOULDER ARTHROSCOPY WITH SUBACROMIAL DECOMPRESSION AND BICEP TENDON REPAIR Right 04/23/2013   Procedure: SHOULDER ARTHROSCOPY WITH SUBACROMIAL DECOMPRESSION AND BICEP TENDON TENOTOMY;  Surgeon: Nita Sells, MD;  Location: [place];  Service: Orthopedics;  Laterality: Right;    There were no vitals filed for this visit.  Subjective Assessment - 02/27/19 1513    Subjective  Pt walked into clinic 9 minutes late.    Currently in Pain?  No/denies            ADULT SLP TREATMENT - 02/27/19 1514      General Information   Behavior/Cognition  Alert;Cooperative;Pleasant mood      Treatment Provided   Treatment provided  Cognitive-Linquistic      Cognitive-Linquistic Treatment   Treatment focused on  Apraxia;Aphasia    Skilled Treatment   Pt used [employer] at [facility], successfully. Pt role played with SLP how she did her interaction and pt communicated successfully. Pt also remarked she was excited about [employer] specials so SLP provided min A rarely with pt programming a [employer] page. Pt stated she would go on later and tweak soup names and salad names - SLP made this the pt's homework for next session. Pt showed SLP her homework from her "birthday party" with Carlo; pt had programmed two more longer utternaces of 2-3 sentences each regarding the gifts she received and one for the cake that was brought by her friends.      Assessment / Recommendations / Plan   Plan  Continue with current plan of care      Progression Toward Goals   Progression toward goals  Progressing toward goals         SLP Short Term Goals - 02/27/19 1559      SLP SHORT TERM GOAL #1   Title  Pt will retrieve 9/10 personally relevant words/images on AAC system in response to open ended questions with rare min A x 3 sessions    Baseline  02/22/19 (food orders, hobbies), 02-27-19 food orders    Time  1    Period  Weeks    Status  On-going      SLP SHORT TERM GOAL #2  Title  pt will use scripting or SGD to successfully convey her food order in role-play scenario x3 sessions.    Baseline  02/20/19, 02-27-19    Time  1    Period  Weeks    Status  On-going      SLP SHORT TERM GOAL #3   Title  Pt will use compensations for anomia/apraxia in 5 minutes simple conversation 80% of the time over 3 sessions.    Baseline  02/20/19    Time  1    Period  Weeks    Status  On-going       SLP Long Term Goals - 02/27/19 1601      SLP LONG TERM GOAL #1   Title  Pt will relay personal or medical information in functional scenarios (using scripting or SGD if necessary) x3 sessions.    Time  5    Period  Weeks    Status  On-going      SLP LONG TERM GOAL #2   Title  pt will report success when ordering food on the phone or at the drive through using  script or SGD x 3 sessions    Baseline  02-27-19    Time  5    Period  Weeks    Status  On-going      SLP LONG TERM GOAL #3   Title  pt will demo 10 minutes functional simple to mod complex conversation with modified independence (using compensations and/or SGD) over three sessions    Time  5    Period  Weeks    Status  On-going       Plan - 02/27/19 1558    Clinical Impression Statement  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 SLP feels presentation most closely resembles anomic aphasia. Verbal apraxia, as well as verbal and written paraphasias noted today. Pt demonstrated her ability to change and add icons to SGD today with rare min A. I recommend cont'd skilled ST for training in compensations for anomia and verbal apraxia, and for training/carryover in use of a speech-generating device given diagnosis of progressive neurologic disease.    Speech Therapy Frequency  2x / week    Duration  --   8 weeks or 17 visits   Treatment/Interventions  Cueing hierarchy;Functional tasks;Patient/family education;Environmental controls;Cognitive reorganization;Multimodal communcation approach;Language facilitation;Compensatory techniques;Internal/external aids;SLP instruction and feedback    Potential to Achieve Goals  Good    Consulted and Agree with Plan of Care  Patient       Patient will benefit from skilled therapeutic intervention in order to improve the following deficits and impairments:   1. Primary progressive aphasia (HCC)   2. Verbal apraxia       Problem List Patient Active Problem List   Diagnosis Date Noted  . Primary progressive aphasia (HCC) 05/25/2018  . Expressive aphasia 09/15/2017  . Migraine 10/13/2016  . Insomnia 10/13/2016  . Essential hypertension 10/13/2016  . Moderate episode of recurrent major depressive disorder (HCC) 05/12/2016  . Small vessel disease, cerebrovascular 12/22/2015  .  Slurred speech 07/24/2015  . Abnormal CT of brain 07/08/2015  . Speech abnormality 06/20/2015  . Thyroid nodule 06/20/2015  . S/P laparoscopic cholecystectomy 12/05/2014  . Heart murmur 02/11/2013  . Type 2 diabetes mellitus, controlled (HCC) 02/09/2013  . Other and unspecified hyperlipidemia 02/09/2013    [facility],Santosh Petter ,MS, CCC-SLP  02/27/2019, 4:02 PM  [facility]  [address] [place], [place], [address] Phone: [phone]   Fax:  [phone]   Name: Meghan GardenerLinda M Hickman MRN: [id number] Date of Birth: Feb 10, 1951

## 2019-03-01 ENCOUNTER — Other Ambulatory Visit: Payer: Self-pay

## 2019-03-01 ENCOUNTER — Ambulatory Visit: Payer: Medicare Other

## 2019-03-01 DIAGNOSIS — F0280 Dementia in other diseases classified elsewhere without behavioral disturbance: Secondary | ICD-10-CM

## 2019-03-01 DIAGNOSIS — R482 Apraxia: Secondary | ICD-10-CM

## 2019-03-01 DIAGNOSIS — G3101 Pick's disease: Secondary | ICD-10-CM | POA: Diagnosis not present

## 2019-03-01 NOTE — Patient Instructions (Signed)
When your device arrives we will meet again!

## 2019-03-01 NOTE — Therapy (Signed)
[facility] [address] [place], [place], [address] Phone: [phone]   Fax:  [phone]  Speech Language Pathology Treatment  Patient Details  Name: Meghan Hickman MRN: [id number] Date of Birth: 1951/07/12 Referring Provider (SLP): Ward Givens, NP   Encounter Date: 03/01/2019  End of Session - 03/01/19 1609    Visit Number  9    Number of Visits  17    Date for SLP Re-Evaluation  04/02/19    SLP Start Time  1504    SLP Stop Time   1545    SLP Time Calculation (min)  41 min    Activity Tolerance  Patient tolerated treatment well       Past Medical History:  Diagnosis Date  . Arthritis   . Depression   . Diabetes mellitus without complication ([place])   . Diverticulitis   . GERD (gastroesophageal reflux disease)   . Headache   . Heart murmur   . Hyperlipidemia   . Hypertension   . Speech problem   . Wears glasses     Past Surgical History:  Procedure Laterality Date  . CHOLECYSTECTOMY N/A 12/05/2014   Procedure: LAPAROSCOPIC CHOLECYSTECTOMY;  Surgeon: Rolm Bookbinder, MD;  Location: [facility];  Service: General;  Laterality: N/A;  . SHOULDER ARTHROSCOPY WITH SUBACROMIAL DECOMPRESSION AND BICEP TENDON REPAIR Right 04/23/2013   Procedure: SHOULDER ARTHROSCOPY WITH SUBACROMIAL DECOMPRESSION AND BICEP TENDON TENOTOMY;  Surgeon: Nita Sells, MD;  Location: [facility];  Service: Orthopedics;  Laterality: Right;    There were no vitals filed for this visit.  Subjective Assessment - 03/01/19 1506    Subjective  "I - did - the -homework."    Currently in Pain?  --            ADULT SLP TREATMENT - 03/01/19 1506      Treatment Provided   Treatment provided  Cognitive-Linquistic      Pain Assessment   Pain Assessment  0-10    Pain Score  3     Pain Location  sinuses    Pain Descriptors / Indicators  Sore    Pain Intervention(s)  Monitored during session      Cognitive-Linquistic Treatment   Treatment focused on  Apraxia;Aphasia    Skilled Treatment  Pt used Lingraphica and programmed device for [employer] coffee with min A occasionally, and with [employer] with modified independene. Pt demonstrated understanding of SLP cues without difficulty. She independently backed up the device to the cloud with a verbal cue as to what her backup name was (lindawinborne). She did not complete the homework of tweaking the soup and the salad choices for [employer].       Assessment / Recommendations / Plan   Plan  Continue with current plan of care   pt on hold until her personal device arrives. She agrees.     Progression Toward Goals   Progression toward goals  Progressing toward goals         SLP Short Term Goals - 03/01/19 1611      SLP SHORT TERM GOAL #1   Title  Pt will retrieve 9/10 personally relevant words/images on AAC system in response to open ended questions with rare min A x 3 sessions    Status  Achieved      SLP SHORT TERM GOAL #2   Title  pt will use scripting or SGD to successfully convey her food order in role-play scenario x3 sessions.  Baseline  02/20/19, 02-27-19    Status  Partially Met   believe pt would have met goal had this been done on 03-01-19     SLP SHORT TERM GOAL #3   Title  Pt will use compensations for anomia/apraxia in 5 minutes simple conversation 80% of the time over 3 sessions.    Status  Partially Met       SLP Long Term Goals - 03/01/19 1612      SLP LONG TERM GOAL #1   Title  Pt will relay personal or medical information in functional scenarios (using scripting or SGD if necessary) x3 sessions.    Time  5    Period  Weeks    Status  On-going      SLP LONG TERM GOAL #2   Title  pt will report success when ordering food on the phone or at the drive through using script or SGD x 3 sessions    Baseline  02-27-19    Time  5    Period  Weeks    Status  On-going      SLP LONG TERM GOAL #3   Title  pt will demo 10  minutes functional simple to mod complex conversation with modified independence (using compensations and/or SGD) over three sessions    Time  5    Period  Weeks    Status  On-going       Plan - 03/01/19 1610    Clinical Impression Statement  Ms. 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 SLP feels presentation most closely resembles anomic aphasia. Verbal apraxia, as well as verbal and written paraphasias noted today. Pt cont'd to demonstrate today her ability to change and add icons to SGD with modified independence to occasional min A. I recommend cont'd skilled ST for training in compensations for anomia and verbal apraxia, and for training/carryover in use of a speech-generating device given diagnosis of progressive neurologic disease. Pt's sessions were put on hold until her device arrives.    Speech Therapy Frequency  2x / week    Duration  --   8 weeks or 17 visits   Treatment/Interventions  Cueing hierarchy;Functional tasks;Patient/family education;Environmental controls;Cognitive reorganization;Multimodal communcation approach;Language facilitation;Compensatory techniques;Internal/external aids;SLP instruction and feedback    Potential to Achieve Goals  Good    Consulted and Agree with Plan of Care  Patient       Patient will benefit from skilled therapeutic intervention in order to improve the following deficits and impairments:   1. 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 Verbal apraxia       Problem List Patient Active Problem List   Diagnosis Date Noted  . 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 Expressive aphasia 09/15/2017  . Migraine 10/13/2016  . Insomnia 10/13/2016  . Essential hypertension 10/13/2016  . Moderate episode of recurrent major depressive disorder ([place]) 05/12/2016  . Small vessel disease, cerebrovascular 12/22/2015  . Slurred speech 07/24/2015  . Abnormal CT of brain  07/08/2015  . Speech abnormality 06/20/2015  . Thyroid nodule 06/20/2015  . S/P laparoscopic cholecystectomy 12/05/2014  . Heart murmur 02/11/2013  . Type 2 diabetes mellitus, controlled ([place]) 02/09/2013  . Other and unspecified hyperlipidemia 02/09/2013    [facility] ,[place], [place]  03/01/2019, 4:12 PM  [facility] [address] [place], [place], [address] Phone: [phone]   Fax:  [phone]   Name: ENA DEMARY MRN:  [id number] Date of Birth: 12/11/1950

## 2019-03-05 ENCOUNTER — Ambulatory Visit: Payer: Medicare Other

## 2019-03-08 ENCOUNTER — Ambulatory Visit: Payer: Medicare Other | Admitting: Speech Pathology

## 2019-03-13 ENCOUNTER — Ambulatory Visit: Payer: Medicare Other | Admitting: Speech Pathology

## 2019-03-15 ENCOUNTER — Ambulatory Visit: Payer: Medicare Other | Admitting: Speech Pathology

## 2019-03-19 ENCOUNTER — Other Ambulatory Visit: Payer: Self-pay | Admitting: Family Medicine

## 2019-03-19 NOTE — Telephone Encounter (Signed)
Forwarding medication refill request to clinical pool or review. 

## 2019-03-20 ENCOUNTER — Ambulatory Visit: Payer: Medicare Other | Admitting: Speech Pathology

## 2019-03-22 ENCOUNTER — Encounter: Payer: Federal, State, Local not specified - PPO | Admitting: Speech Pathology

## 2019-03-26 ENCOUNTER — Telehealth: Payer: Self-pay

## 2019-03-26 NOTE — Telephone Encounter (Signed)
I called and spoke to [facility].  Have not received any p/w for Lingraphica.  (recommended by ST).  She will fax to [phone].

## 2019-03-26 NOTE — Telephone Encounter (Signed)
Pt requesting tramodol, last given 09/13/2018. No upcoming appts

## 2019-03-26 NOTE — Telephone Encounter (Signed)
Meghan Hickman from [facility] called stating that they had faxed over orders for a speech generating device and wondered if this had been received. Meghan Hickman can be reached at [phone]. Patient last saw [name], NP but Dr. Krista Blue is her physician, I'm not sure who would have received the fax.

## 2019-03-27 ENCOUNTER — Encounter: Payer: Federal, State, Local not specified - PPO | Admitting: Speech Pathology

## 2019-03-28 ENCOUNTER — Other Ambulatory Visit: Payer: Self-pay | Admitting: Family Medicine

## 2019-03-29 ENCOUNTER — Encounter: Payer: Federal, State, Local not specified - PPO | Admitting: Speech Pathology

## 2019-04-02 NOTE — Telephone Encounter (Signed)
Raquel Sarna @ [facility] has called stating the matter is now urgent. She wants to know if the fax that was sent to [phone] was received on 08-17 and if so what is the status of it being completed.  Raquel Sarna can be reached at [phone]

## 2019-04-02 NOTE — Telephone Encounter (Signed)
Unable to get in contact with [facility]. I left her voicemail asking her to return my call and resend the fax. I haven't received fax. Office number and pod fax number was provided.

## 2019-04-02 NOTE — Telephone Encounter (Signed)
Still haven't received any paperwork from [facility].

## 2019-04-03 ENCOUNTER — Encounter: Payer: Federal, State, Local not specified - PPO | Admitting: Speech Pathology

## 2019-04-03 NOTE — Telephone Encounter (Signed)
Orders signed by MM/NP today.  Fax confirmation received Lingraphica [phone].  [phone] ofv.  I spoke to [facility] and she did received the order by fax.  Signed order to MR.

## 2019-04-05 ENCOUNTER — Ambulatory Visit: Payer: Medicare Other | Admitting: Speech Pathology

## 2019-04-10 ENCOUNTER — Ambulatory Visit: Payer: Medicare Other | Attending: Adult Health | Admitting: Speech Pathology

## 2019-04-10 ENCOUNTER — Other Ambulatory Visit: Payer: Self-pay

## 2019-04-10 DIAGNOSIS — R482 Apraxia: Secondary | ICD-10-CM | POA: Insufficient documentation

## 2019-04-10 DIAGNOSIS — G3101 Pick's disease: Secondary | ICD-10-CM | POA: Diagnosis present

## 2019-04-10 DIAGNOSIS — F0280 Dementia in other diseases classified elsewhere without behavioral disturbance: Secondary | ICD-10-CM | POA: Diagnosis present

## 2019-04-10 NOTE — Therapy (Signed)
[facility] [address] [place], [place], [address] Phone: [phone]   Fax:  [phone]  Speech Language Pathology Treatment and Progress Note  Patient Details  Name: Meghan Hickman MRN: [id number] Date of Birth: [birth year] Referring Provider (SLP): Ward Givens, NP   Encounter Date: 04/10/2019  End of Session - 04/10/19 1556    Visit Number  10    Number of Visits  17    Date for SLP Re-Evaluation  06/08/19    Authorization Type  BCBS, Medicare    SLP Start Time  1405    SLP Stop Time   1445    SLP Time Calculation (min)  40 min    Activity Tolerance  Patient tolerated treatment well       Past Medical History:  Diagnosis Date  . Arthritis   . Depression   . Diabetes mellitus without complication ([place])   . Diverticulitis   . GERD (gastroesophageal reflux disease)   . Headache   . Heart murmur   . Hyperlipidemia   . Hypertension   . Speech problem   . Wears glasses     Past Surgical History:  Procedure Laterality Date  . CHOLECYSTECTOMY N/A 12/05/2014   Procedure: LAPAROSCOPIC CHOLECYSTECTOMY;  Surgeon: Rolm Bookbinder, MD;  Location: [facility];  Service: General;  Laterality: N/A;  . SHOULDER ARTHROSCOPY WITH SUBACROMIAL DECOMPRESSION AND BICEP TENDON REPAIR Right 04/23/2013   Procedure: SHOULDER ARTHROSCOPY WITH SUBACROMIAL DECOMPRESSION AND BICEP TENDON TENOTOMY;  Surgeon: Nita Sells, MD;  Location: [facility];  Service: Orthopedics;  Laterality: Right;    There were no vitals filed for this visit.  Subjective Assessment - 04/10/19 1410    Subjective  "They sent me the tablet."    Currently in Pain?  No/denies            ADULT SLP TREATMENT - 04/10/19 1550      General Information   Behavior/Cognition  Alert;Cooperative;Pleasant mood      Treatment Provided   Treatment provided  Cognitive-Linquistic      Pain Assessment   Pain Assessment  No/denies  pain      Cognitive-Linquistic Treatment   Treatment focused on  Apraxia;Aphasia    Skilled Treatment  Pt used Lingraphica to relay personal information or request assistance for emergency/medical scenarios with extended time, occasional min A. Pt modified emergeny contact icon with occasional mod A; pt requested assistance as it had been several weeks since working with her device. As pt modified/added icons later in session, SLP able to fade cues to occasional min A.       Assessment / Recommendations / Plan   Plan  Continue with current plan of care      Progression Toward Goals   Progression toward goals  Progressing toward goals         SLP Short Term Goals - 04/10/19 1414      SLP SHORT TERM GOAL #1   Title  Pt will retrieve 9/10 personally relevant words/images on AAC system in response to open ended questions with rare min A x 3 sessions    Status  Achieved      SLP SHORT TERM GOAL #2   Title  pt will use scripting or SGD to successfully convey her food order in role-play scenario x3 sessions.    Baseline  02/20/19, 02-27-19    Status  Partially Met   believe pt would have met goal had this been done on 03-01-19  SLP SHORT TERM GOAL #3   Title  Pt will use compensations for anomia/apraxia in 5 minutes simple conversation 80% of the time over 3 sessions.    Status  Partially Met       SLP Long Term Goals - 04/10/19 1414      SLP LONG TERM GOAL #1   Title  Pt will relay personal or medical information in functional scenarios (using scripting or SGD if necessary) x3 sessions.    Baseline  --    Time  4    Period  Weeks    Status  On-going      SLP LONG TERM GOAL #2   Title  pt will report success when ordering food on the phone or at the drive through using script or SGD x 3 sessions    Baseline  02-27-19    Time  4    Period  Weeks    Status  On-going      SLP LONG TERM GOAL #3   Title  pt will demo 10 minutes functional simple to mod complex conversation with  modified independence (using compensations and/or SGD) over three sessions    Time  4    Period  Weeks    Status  On-going       Plan - 04/10/19 1421    Clinical Impression Statement  Ms. 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 Verbal apraxia, as well as verbal and written paraphasias noted today. Pt received Lingraphica MiniTalk this weekend; occasional min-mod A for adding/changing icons today after several weeks without the device. SLP able to fade to assisting with occasional min A by end of session. I recommend cont'd skilled ST for training in compensations for anomia and verbal apraxia, and for training/carryover in use of a speech-generating device given diagnosis of progressive neurologic disease.    Speech Therapy Frequency  2x / week    Duration  --   8 weeks or 17 visits   Treatment/Interventions  Cueing hierarchy;Functional tasks;Patient/family education;Environmental controls;Cognitive reorganization;Multimodal communcation approach;Language facilitation;Compensatory techniques;Internal/external aids;SLP instruction and feedback    Potential to Achieve Goals  Good    Consulted and Agree with Plan of Care  Patient       Patient will benefit from skilled therapeutic intervention in order to improve the following deficits and impairments:   Primary progressive aphasia [facility])  Verbal apraxia    Problem List Patient Active Problem List   Diagnosis Date Noted  . 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 Expressive aphasia 09/15/2017  . Migraine 10/13/2016  . Insomnia 10/13/2016  . Essential hypertension 10/13/2016  . Moderate episode of recurrent major depressive disorder ([place]) 05/12/2016  . Small vessel disease, cerebrovascular 12/22/2015  . Slurred speech 07/24/2015  . Abnormal CT of brain 07/08/2015  . Speech abnormality 06/20/2015  . Thyroid nodule 06/20/2015  . S/P laparoscopic  cholecystectomy 12/05/2014  . Heart murmur 02/11/2013  . Type 2 diabetes mellitus, controlled ([place]) 02/09/2013  . Other and unspecified hyperlipidemia 02/09/2013   Speech Therapy Progress Note  Dates of Reporting Period: 02/01/19 to 04/10/19  Pt has been seen for 10 speech therapy sessions. Continue skilled ST to maximize carryover in use of SGD and compensations for aphasia/apraxia. See goals and clinical impressions above for details.  Deneise Lever, [place], CCC-SLP Speech-Language Pathologist   Aliene Altes 04/10/2019, 3:57 PM  [facility] [address]  [place], [place], [address] Phone: [phone]   Fax:  [phone]   Name: TYLER ROBIDOUX MRN: [id number] Date of Birth: September 05, 1950

## 2019-04-12 ENCOUNTER — Other Ambulatory Visit: Payer: Self-pay

## 2019-04-12 ENCOUNTER — Ambulatory Visit: Payer: Medicare Other

## 2019-04-12 DIAGNOSIS — F0280 Dementia in other diseases classified elsewhere without behavioral disturbance: Secondary | ICD-10-CM

## 2019-04-12 DIAGNOSIS — R482 Apraxia: Secondary | ICD-10-CM

## 2019-04-12 DIAGNOSIS — G3101 Pick's disease: Secondary | ICD-10-CM | POA: Diagnosis not present

## 2019-04-12 NOTE — Patient Instructions (Signed)
Work on more entries for your "family" page, and add something to your restaurant page so you can order more food.

## 2019-04-12 NOTE — Therapy (Signed)
[facility] [address] [place], [place], [address] Phone: [phone]   Fax:  [phone]  Speech Language Pathology Treatment  Patient Details  Name: Meghan Hickman MRN: [id number] Date of Birth: 07-08-1951 Referring Provider (SLP): Meghan Givens, NP   Encounter Date: 04/12/2019  End of Session - 04/12/19 1713    Visit Number  11    Number of Visits  17    Date for SLP Re-Evaluation  06/08/19    SLP Start Time  1414    SLP Stop Time   1445    SLP Time Calculation (min)  31 min    Activity Tolerance  Patient tolerated treatment well       Past Medical History:  Diagnosis Date  . Arthritis   . Depression   . Diabetes mellitus without complication ([place])   . Diverticulitis   . GERD (gastroesophageal reflux disease)   . Headache   . Heart murmur   . Hyperlipidemia   . Hypertension   . Speech problem   . Wears glasses     Past Surgical History:  Procedure Laterality Date  . CHOLECYSTECTOMY N/A 12/05/2014   Procedure: LAPAROSCOPIC CHOLECYSTECTOMY;  Surgeon: Rolm Bookbinder, MD;  Location: [facility];  Service: General;  Laterality: N/A;  . SHOULDER ARTHROSCOPY WITH SUBACROMIAL DECOMPRESSION AND BICEP TENDON REPAIR Right 04/23/2013   Procedure: SHOULDER ARTHROSCOPY WITH SUBACROMIAL DECOMPRESSION AND BICEP TENDON TENOTOMY;  Surgeon: Nita Sells, MD;  Location: [facility];  Service: Orthopedics;  Laterality: Right;    There were no vitals filed for this visit.  Subjective Assessment - 04/12/19 1416    Subjective  Pt arrived 13 minutes late for session.    Currently in Pain?  No/denies            ADULT SLP TREATMENT - 04/12/19 1416      General Information   Behavior/Cognition  Alert;Cooperative;Pleasant mood      Treatment Provided   Treatment provided  Cognitive-Linquistic      Cognitive-Linquistic Treatment   Treatment focused on  Apraxia;Aphasia    Skilled Treatment   Pt inquired which practice activities would be helpful for her to use on the device - SLP told pt greetings and conversation sections would e helpful, then shared that the device was not primarily for practice but for augmentation/generation of verbal communication and explained rationale. Pt had not done anything with the device since last ST session but showed SLP where she put the items in the previous ST session. She then made icons in the "family" section - first, pt put her brother in and included his contact infomation. SLP educated pt that this was for sharing with someone ABOUT her family, not for emergency information. She req'd min A for what to put as message read and pt chose to include pt's nicknames for her brothers on their pages. Pt homework to add more pages to her "family" page, and to add more to her "restaurant" page so she can order a wider variety of things.       Assessment / Recommendations / Plan   Plan  Continue with current plan of care      Progression Toward Goals   Progression toward goals  Progressing toward goals   pt late to most all ST appointments      SLP Education - 04/12/19 1712    Education Details  device is primarily for speech augmentation/speech generation, not for practicing her talking with the "exercises"  module    Person(s) Educated  Patient    Methods  Explanation    Comprehension  Verbalized understanding       SLP Short Term Goals - 04/10/19 1414      SLP SHORT TERM GOAL #1   Title  Pt will retrieve 9/10 personally relevant words/images on AAC system in response to open ended questions with rare min A x 3 sessions    Status  Achieved      SLP SHORT TERM GOAL #2   Title  pt will use scripting or SGD to successfully convey her food order in role-play scenario x3 sessions.    Baseline  02/20/19, 02-27-19    Status  Partially Met   believe pt would have met goal had this been done on 03-01-19     SLP SHORT TERM GOAL #3   Title  Pt will use  compensations for anomia/apraxia in 5 minutes simple conversation 80% of the time over 3 sessions.    Status  Partially Met       SLP Long Term Goals - 04/12/19 1716      SLP LONG TERM GOAL #1   Title  Pt will relay personal or medical information in functional scenarios (using scripting or SGD if necessary) x3 sessions.    Time  4    Period  Weeks    Status  On-going      SLP LONG TERM GOAL #2   Title  pt will report success when ordering food on the phone or at the drive through using script or SGD x 3 sessions    Baseline  02-27-19    Time  4    Period  Weeks    Status  On-going      SLP LONG TERM GOAL #3   Title  pt will demo 10 minutes functional simple to mod complex conversation with modified independence (using compensations and/or SGD) over three sessions    Time  4    Period  Weeks    Status  On-going       Plan - 04/12/19 1714    Clinical Impression Statement  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 Verbal apraxia, as well as verbal and written paraphasias noted today. Pt received Lingraphica MiniTalk this last weekend; min A for adding/changing icons after several weeks without the device. I recommend cont'd skilled ST for training in compensations for anomia and verbal apraxia, and for training/carryover in use of a speech-generating device given diagnosis of progressive neurologic disease.    Speech Therapy Frequency  2x / week    Duration  --   8 weeks or 17 visits   Treatment/Interventions  Cueing hierarchy;Functional tasks;Patient/family education;Environmental controls;Cognitive reorganization;Multimodal communcation approach;Language facilitation;Compensatory techniques;Internal/external aids;SLP instruction and feedback    Potential to Achieve Goals  Good    Consulted and Agree with Plan of Care  Patient       Patient will benefit from skilled therapeutic intervention in order to  improve the following deficits and impairments:   Verbal apraxia  Primary progressive aphasia [facility])    Problem List Patient Active Problem List   Diagnosis Date Noted  . 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 Expressive aphasia 09/15/2017  . Migraine 10/13/2016  . Insomnia 10/13/2016  . Essential hypertension 10/13/2016  . Moderate episode of recurrent major depressive disorder ([place]) 05/12/2016  . Small vessel disease, cerebrovascular 12/22/2015  . Slurred speech  07/24/2015  . Abnormal CT of brain 07/08/2015  . Speech abnormality 06/20/2015  . Thyroid nodule 06/20/2015  . S/P laparoscopic cholecystectomy 12/05/2014  . Heart murmur 02/11/2013  . Type 2 diabetes mellitus, controlled ([place]) 02/09/2013  . Other and unspecified hyperlipidemia 02/09/2013    [facility] ,[place], [place]  04/12/2019, 5:16 PM  [facility] [address] [place], [place], [address] Phone: [phone]   Fax:  [phone]   Name: Meghan Hickman MRN: [id number] Date of Birth: 29-Apr-1951

## 2019-04-17 ENCOUNTER — Ambulatory Visit: Payer: Medicare Other | Admitting: Speech Pathology

## 2019-04-19 ENCOUNTER — Ambulatory Visit: Payer: Medicare Other

## 2019-04-19 ENCOUNTER — Other Ambulatory Visit: Payer: Self-pay

## 2019-04-19 DIAGNOSIS — G3101 Pick's disease: Secondary | ICD-10-CM | POA: Diagnosis not present

## 2019-04-19 DIAGNOSIS — F0280 Dementia in other diseases classified elsewhere without behavioral disturbance: Secondary | ICD-10-CM

## 2019-04-19 DIAGNOSIS — R482 Apraxia: Secondary | ICD-10-CM

## 2019-04-19 NOTE — Patient Instructions (Signed)
You need to make sure you are using the device every day - please use at least twice a day until we see you again.

## 2019-04-19 NOTE — Therapy (Signed)
[facility] [address] [place], [place], [address] Phone: [phone]   Fax:  [phone]  Speech Language Pathology Treatment  Patient Details  Name: Meghan Hickman MRN: [id number] Date of Birth: 06/29/1951 Referring Provider (SLP): Meghan Givens, NP   Encounter Date: 04/19/2019  End of Session - 04/19/19 1513    Visit Number  12    Number of Visits  17    Date for SLP Re-Evaluation  06/08/19    SLP Start Time  66    SLP Stop Time   1445    SLP Time Calculation (min)  33 min    Activity Tolerance  Patient tolerated treatment well       Past Medical History:  Diagnosis Date  . Arthritis   . Depression   . Diabetes mellitus without complication ([place])   . Diverticulitis   . GERD (gastroesophageal reflux disease)   . Headache   . Heart murmur   . Hyperlipidemia   . Hypertension   . Speech problem   . Wears glasses     Past Surgical History:  Procedure Laterality Date  . CHOLECYSTECTOMY N/A 12/05/2014   Procedure: LAPAROSCOPIC CHOLECYSTECTOMY;  Surgeon: Rolm Bookbinder, MD;  Location: [facility];  Service: General;  Laterality: N/A;  . SHOULDER ARTHROSCOPY WITH SUBACROMIAL DECOMPRESSION AND BICEP TENDON REPAIR Right 04/23/2013   Procedure: SHOULDER ARTHROSCOPY WITH SUBACROMIAL DECOMPRESSION AND BICEP TENDON TENOTOMY;  Surgeon: Nita Sells, MD;  Location: [facility];  Service: Orthopedics;  Laterality: Right;    There were no vitals filed for this visit.  Subjective Assessment - 04/19/19 1414    Subjective  Pt 10 minutes late.    Currently in Pain?  No/denies            ADULT SLP TREATMENT - 04/19/19 1414      General Information   Behavior/Cognition  Alert;Cooperative;Pleasant mood      Treatment Provided   Treatment provided  Cognitive-Linquistic      Cognitive-Linquistic Treatment   Treatment focused on  Apraxia;Aphasia    Skilled Treatment  Pt continues to  arrive 10-15 minutes late to appointments. She brought her device with her today, with more details added for her two brothers. She added another "lennie" to the page and SLP inquired why two icons with same name but different pictures. PT ultimately expalined that the secons lennie was her nephew. SLP suggested pt modify icon to provide that info but pt did not do so during session. Pt told SLP she has used the device "not often".  Pt also made icons for "doctors" for questions she may have for the doctors. SLP assisted pt (rare min A) in making pages for neurologist and PCP. In order to get pt thiking about how to use the device more SLP asked pt when she could use it in the next week. Pt will use it for ordering food, and for asking questions when she's out. One question she did generate, for "shopping" page, with SLP assistance in spelling was "Do you have Lysol in stock?" SLP told pt that she needed to use the device at least twice each day - and will maybe need to think about what she is doing that day in order to use the device. SLP further explained that rationale for her having the device traiing now, as opposed to later when langauge is more difficult for her to use overall.       Assessment / Recommendations / Plan  Plan  Continue with current plan of care      Progression Toward Goals   Progression toward goals  --   continued tardiness is a concern        SLP Short Term Goals - 04/10/19 1414      SLP SHORT TERM GOAL #1   Title  Pt will retrieve 9/10 personally relevant words/images on AAC system in response to open ended questions with rare min A x 3 sessions    Status  Achieved      SLP SHORT TERM GOAL #2   Title  pt will use scripting or SGD to successfully convey her food order in role-play scenario x3 sessions.    Baseline  02/20/19, 02-27-19    Status  Partially Met   believe pt would have met goal had this been done on 03-01-19     SLP SHORT TERM GOAL #3   Title  Pt will  use compensations for anomia/apraxia in 5 minutes simple conversation 80% of the time over 3 sessions.    Status  Partially Met       SLP Long Term Goals - 04/19/19 1517      SLP LONG TERM GOAL #1   Title  Pt will relay personal or medical information in functional scenarios (using scripting or SGD if necessary) x3 sessions.    Time  3    Period  Weeks    Status  On-going      SLP LONG TERM GOAL #2   Title  pt will report success when ordering food on the phone or at the drive through using script or SGD x 3 sessions    Baseline  02-27-19    Time  3    Period  Weeks    Status  On-going      SLP LONG TERM GOAL #3   Title  pt will demo 10 minutes functional simple to mod complex conversation with modified independence (using compensations and/or SGD) over three sessions    Time  3    Period  Weeks    Status  On-going       Plan - 04/19/19 1515    Clinical Impression Statement  [name] presents with mild-moderate overall difficulty with expressive speech today, moreso than last week with this SLP. SLP has concern that pt is not using her device "Not too often" she stated today, re: frequency of usage. SLP reiterated today that she needs to use the device now to get used to using it when her language is not as impaired and to become more familiar with the programming and daily usage when it becomes more difficult for her to verbally communicate. Her device trial will likely be ending sometime in the next 7-10 days. I recommend cont'd skilled ST for training in compensations for anomia and verbal apraxia, and for training/carryover in use of a speech-generating device given diagnosis of progressive neurologic disease.    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 Expressive aphasia 09/15/2017  . Migraine 10/13/2016  . Insomnia 10/13/2016  . Essential hypertension 10/13/2016  . Moderate episode of recurrent major depressive disorder ([place]) 05/12/2016  . Small vessel disease, cerebrovascular 12/22/2015  . Slurred speech 07/24/2015  . Abnormal CT of brain 07/08/2015  . Speech abnormality 06/20/2015  . Thyroid nodule 06/20/2015  . S/P laparoscopic cholecystectomy 12/05/2014  . Heart murmur 02/11/2013  . Type 2 diabetes mellitus, controlled ([place]) 02/09/2013  . Other and unspecified hyperlipidemia 02/09/2013    [facility] ,[place], [place] 04/19/2019, 3:20 PM  [facility] [address] [place], [place], [address] Phone: [phone]   Fax:  [phone]   Name: Meghan Hickman MRN: [id number] Date of Birth: 10-Aug-1950

## 2019-04-24 ENCOUNTER — Ambulatory Visit: Payer: Medicare Other | Admitting: Speech Pathology

## 2019-04-24 ENCOUNTER — Other Ambulatory Visit: Payer: Self-pay

## 2019-04-24 DIAGNOSIS — R482 Apraxia: Secondary | ICD-10-CM

## 2019-04-24 DIAGNOSIS — G3101 Pick's disease: Secondary | ICD-10-CM | POA: Diagnosis not present

## 2019-04-24 DIAGNOSIS — F0280 Dementia in other diseases classified elsewhere without behavioral disturbance: Secondary | ICD-10-CM

## 2019-04-26 ENCOUNTER — Ambulatory Visit: Payer: Medicare Other | Admitting: Speech Pathology

## 2019-04-26 ENCOUNTER — Other Ambulatory Visit: Payer: Self-pay

## 2019-04-26 DIAGNOSIS — G3101 Pick's disease: Secondary | ICD-10-CM | POA: Diagnosis not present

## 2019-04-26 DIAGNOSIS — F0280 Dementia in other diseases classified elsewhere without behavioral disturbance: Secondary | ICD-10-CM

## 2019-04-26 DIAGNOSIS — R482 Apraxia: Secondary | ICD-10-CM

## 2019-04-26 NOTE — Therapy (Signed)
[facility] [address] [place], [place], [address] Phone: [phone]   Fax:  [phone]  Speech Language Pathology Treatment  Patient Details  Name: Meghan Hickman MRN: [id number] Date of Birth: 1951/01/06 Referring Provider (SLP): Meghan Givens, NP   Encounter Date: 04/24/2019  End of Session - 04/26/19 1405    Visit Number  13    Number of Visits  17    Date for SLP Re-Evaluation  06/08/19    Authorization Type  BCBS, Medicare    SLP Start Time  2353    SLP Stop Time   1445    SLP Time Calculation (min)  34 min    Activity Tolerance  Patient tolerated treatment well       Past Medical History:  Diagnosis Date  . Arthritis   . Depression   . Diabetes mellitus without complication ([facility])   . Diverticulitis   . GERD (gastroesophageal reflux disease)   . Headache   . Heart murmur   . Hyperlipidemia   . Hypertension   . Speech problem   . Wears glasses     Past Surgical History:  Procedure Laterality Date  . CHOLECYSTECTOMY N/A 12/05/2014   Procedure: LAPAROSCOPIC CHOLECYSTECTOMY;  Surgeon: Meghan Bookbinder, MD;  Location: [facility];  Service: General;  Laterality: N/A;  . SHOULDER ARTHROSCOPY WITH SUBACROMIAL DECOMPRESSION AND BICEP TENDON REPAIR Right 04/23/2013   Procedure: SHOULDER ARTHROSCOPY WITH SUBACROMIAL DECOMPRESSION AND BICEP TENDON TENOTOMY;  Surgeon: Meghan Sells, MD;  Location: [facility];  Service: Orthopedics;  Laterality: Right;    There were no vitals filed for this visit.         ADULT SLP TREATMENT - 04/26/19 1402      General Information   Behavior/Cognition  Alert;Cooperative;Pleasant mood      Treatment Provided   Treatment provided  Cognitive-Linquistic      Pain Assessment   Pain Assessment  No/denies pain      Cognitive-Linquistic Treatment   Treatment focused on  Apraxia;Aphasia    Skilled Treatment  Patient was 10 min late. She  reports, "I used it a lot," re: her device. She showed SLP 3 questions she created to ask SLP about features/operation of her device. SLP was able to assist pt with these questions and praised pt, noting that using the device to prepare her questions in advance saved significant time in session. Pt also created icons to use on a phone call with relative Meghan Hickman. She also created question for her neurologist. SLP assisted pt with programming section for phone calls.       Assessment / Recommendations / Plan   Plan  Continue with current plan of care      Progression Toward Goals   Progression toward goals  Progressing toward goals         SLP Short Term Goals - 04/26/19 1407      SLP SHORT TERM GOAL #1   Title  Pt will retrieve 9/10 personally relevant words/images on AAC system in response to open ended questions with rare min A x 3 sessions    Status  Achieved      SLP SHORT TERM GOAL #2   Title  pt will use scripting or SGD to successfully convey her food order in role-play scenario x3 sessions.    Baseline  02/20/19, 02-27-19    Status  Partially Met   believe pt would have met goal had this been done on 03-01-19  SLP SHORT TERM GOAL #3   Title  Pt will use compensations for anomia/apraxia in 5 minutes simple conversation 80% of the time over 3 sessions.    Status  Partially Met       SLP Long Term Goals - 04/26/19 1407      SLP LONG TERM GOAL #1   Title  Pt will relay personal or medical information in functional scenarios (using scripting or SGD if necessary) x3 sessions.    Time  2    Period  Weeks    Status  On-going      SLP LONG TERM GOAL #2   Title  pt will report success when ordering food on the phone or at the drive through using script or SGD x 3 sessions    Baseline  02-27-19    Time  2    Period  Weeks    Status  On-going      SLP LONG TERM GOAL #3   Title  pt will demo 10 minutes functional simple to mod complex conversation with modified independence  (using compensations and/or SGD) over three sessions    Time  2    Period  Weeks    Status  On-going       Plan - 04/26/19 1406    Clinical Impression Statement  Meghan Hickman presents with mild-moderate overall difficulty with expressive speech today, more so than last week with this SLP.Pt reports using her device more since last session with SLP. Had created several new icons this time. SLP reiterated today that she needs to use the device now to get used to using it when her language is not as impaired and to become more familiar with the programming and daily usage when it becomes more difficult for her to verbally communicate. Pt now with her own device; I recommend cont'd skilled ST for training in compensations for anomia and verbal apraxia, and for training/carryover in use of a speech-generating device given diagnosis of progressive neurologic disease.    Speech Therapy Frequency  2x / week    Duration  --   8 weeks or 17 visits   Treatment/Interventions  Cueing hierarchy;Functional tasks;Patient/family education;Environmental controls;Cognitive reorganization;Multimodal communcation approach;Language facilitation;Compensatory techniques;Internal/external aids;SLP instruction and feedback    Potential to Achieve Goals  Good    Consulted and Agree with Plan of Care  Patient       Patient will benefit from skilled therapeutic intervention in order to improve the following deficits and impairments:   Primary progressive aphasia [facility])  Verbal apraxia    Problem List Patient Active Problem List   Diagnosis Date Noted  . 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 Expressive aphasia 09/15/2017  . Migraine 10/13/2016  . Insomnia 10/13/2016  . Essential hypertension 10/13/2016  . Moderate episode of recurrent major depressive disorder ([place]) 05/12/2016  . Small vessel disease, cerebrovascular 12/22/2015  . Slurred speech 07/24/2015  . Abnormal CT of brain 07/08/2015  . Speech  abnormality 06/20/2015  . Thyroid nodule 06/20/2015  . S/P laparoscopic cholecystectomy 12/05/2014  . Heart murmur 02/11/2013  . Type 2 diabetes mellitus, controlled ([place]) 02/09/2013  . Other and unspecified hyperlipidemia 02/09/2013   Meghan Hickman, [place], [place] Meghan Hickman 04/26/2019, 2:08 PM  [facility] [address] [place], [place], [address] Phone: [phone]   Fax:  [phone]   Name: Meghan Hickman MRN: [id number] Date of Birth: [birth year]

## 2019-04-26 NOTE — Therapy (Signed)
[facility] [address] [place], [place], [address] Phone: [phone]   Fax:  [phone]  Speech Language Pathology Treatment  Patient Details  Name: Meghan Hickman MRN: [id number] Date of Birth: 1950-11-19 Referring Provider (SLP): Ward Givens, NP   Encounter Date: 04/26/2019    Past Medical History:  Diagnosis Date  . Arthritis   . Depression   . Diabetes mellitus without complication ([place])   . Diverticulitis   . GERD (gastroesophageal reflux disease)   . Headache   . Heart murmur   . Hyperlipidemia   . Hypertension   . Speech problem   . Wears glasses     Past Surgical History:  Procedure Laterality Date  . CHOLECYSTECTOMY N/A 12/05/2014   Procedure: LAPAROSCOPIC CHOLECYSTECTOMY;  Surgeon: Rolm Bookbinder, MD;  Location: [facility];  Service: General;  Laterality: N/A;  . SHOULDER ARTHROSCOPY WITH SUBACROMIAL DECOMPRESSION AND BICEP TENDON REPAIR Right 04/23/2013   Procedure: SHOULDER ARTHROSCOPY WITH SUBACROMIAL DECOMPRESSION AND BICEP TENDON TENOTOMY;  Surgeon: Nita Sells, MD;  Location: [facility];  Service: Orthopedics;  Laterality: Right;    There were no vitals filed for this visit.  Subjective Assessment - 04/26/19 1754    Subjective  Pt arrives and takes out her device.    Currently in Pain?  No/denies            ADULT SLP TREATMENT - 04/26/19 1619      General Information   Behavior/Cognition  Alert;Cooperative;Pleasant mood      Treatment Provided   Treatment provided  Cognitive-Linquistic      Pain Assessment   Pain Assessment  No/denies pain      Cognitive-Linquistic Treatment   Treatment focused on  Apraxia;Aphasia    Skilled Treatment  Pt told SLP about asking for directions when running an errand. She asked for assistance from a shop worker, who she reported was patient with her. SLP demonstrated how pt might have used her Lingraphica in this  situation. Pt showed SLP icons she added to assist with giving directions to her house and to help her communicate with an old friend she saw recently. She also demo'd using an icon she used to return a phone call; she reports she was surprised this worked well on an automated phone system. SLP modified section in pt's "Me" folder for conversation topics. Pt generated several topics including music, news and gardening. Pt to add icons to aid in discussing her interests.      Assessment / Recommendations / Plan   Plan  Continue with current plan of care      Progression Toward Goals   Progression toward goals  Progressing toward goals         SLP Short Term Goals - 04/26/19 1753      SLP SHORT TERM GOAL #1   Title  Pt will retrieve 9/10 personally relevant words/images on AAC system in response to open ended questions with rare min A x 3 sessions    Status  Achieved      SLP SHORT TERM GOAL #2   Title  pt will use scripting or SGD to successfully convey her food order in role-play scenario x3 sessions.    Baseline  02/20/19, 02-27-19    Status  Partially Met   believe pt would have met goal had this been done on 03-01-19     SLP SHORT TERM GOAL #3   Title  Pt will use compensations for anomia/apraxia in  5 minutes simple conversation 80% of the time over 3 sessions.    Status  Partially Met       SLP Long Term Goals - 04/26/19 1753      SLP LONG TERM GOAL #1   Title  Pt will relay personal or medical information in functional scenarios (using scripting or SGD if necessary) x3 sessions.    Time  2    Period  Weeks    Status  On-going      SLP LONG TERM GOAL #2   Title  pt will report success when ordering food on the phone or at the drive through using script or SGD x 3 sessions    Baseline  02-27-19    Time  2    Period  Weeks    Status  On-going      SLP LONG TERM GOAL #3   Title  pt will demo 10 minutes functional simple to mod complex conversation with modified independence  (using compensations and/or SGD) over three sessions    Time  2    Period  Weeks    Status  On-going       Plan - 04/26/19 1753    Clinical Impression Statement  Ms. Peterkin presents with mild-moderate overall difficulty with expressive speech today, more so than last week with this SLP.Pt reports using her device more since last session with SLP. Had created several new icons this time. SLP reiterated today that she needs to use the device now to get used to using it when her language is not as impaired and to become more familiar with the programming and daily usage when it becomes more difficult for her to verbally communicate. Pt now with her own device; I recommend cont'd skilled ST for training in compensations for anomia and verbal apraxia, and for training/carryover in use of a speech-generating device given diagnosis of progressive neurologic disease.    Speech Therapy Frequency  2x / week    Duration  --   8 weeks or 17 visits   Treatment/Interventions  Cueing hierarchy;Functional tasks;Patient/family education;Environmental controls;Cognitive reorganization;Multimodal communcation approach;Language facilitation;Compensatory techniques;Internal/external aids;SLP instruction and feedback    Potential to Achieve Goals  Good    Consulted and Agree with Plan of Care  Patient       Patient will benefit from skilled therapeutic intervention in order to improve the following deficits and impairments:   Primary progressive aphasia [facility])  Verbal apraxia    Problem List Patient Active Problem List   Diagnosis Date Noted  . 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 Expressive aphasia 09/15/2017  . Migraine 10/13/2016  . Insomnia 10/13/2016  . Essential hypertension 10/13/2016  . Moderate episode of recurrent major depressive disorder ([place]) 05/12/2016  . Small vessel disease, cerebrovascular 12/22/2015  . Slurred speech 07/24/2015  . Abnormal CT of brain 07/08/2015  . Speech  abnormality 06/20/2015  . Thyroid nodule 06/20/2015  . S/P laparoscopic cholecystectomy 12/05/2014  . Heart murmur 02/11/2013  . Type 2 diabetes mellitus, controlled ([place]) 02/09/2013  . Other and unspecified hyperlipidemia 02/09/2013   Deneise Lever, [place], [place] 04/26/2019, 5:54 PM  [facility] [address] [place], [place], [address] Phone: [phone]   Fax:  [phone]   Name: MARAKI MACQUARRIE MRN: [id number] Date of Birth: [birth year]

## 2019-05-01 ENCOUNTER — Ambulatory Visit: Payer: Medicare Other | Admitting: Speech Pathology

## 2019-05-01 ENCOUNTER — Other Ambulatory Visit: Payer: Self-pay

## 2019-05-01 DIAGNOSIS — F0280 Dementia in other diseases classified elsewhere without behavioral disturbance: Secondary | ICD-10-CM

## 2019-05-01 DIAGNOSIS — G3101 Pick's disease: Secondary | ICD-10-CM

## 2019-05-01 DIAGNOSIS — R482 Apraxia: Secondary | ICD-10-CM

## 2019-05-02 NOTE — Therapy (Signed)
[facility] [address] [place], [place], [address] Phone: [phone]   Fax:  [phone]  Speech Language Pathology Treatment  Patient Details  Name: Meghan Hickman MRN: [id number] Date of Birth: 20-Aug-1950 Referring Provider (SLP): Ward Givens, NP   Encounter Date: 05/01/2019  End of Session - 05/02/19 0935    Visit Number  14    Number of Visits  17    Date for SLP Re-Evaluation  06/08/19    Authorization Type  BCBS, Medicare    SLP Start Time  1410    SLP Stop Time   1450    SLP Time Calculation (min)  40 min    Activity Tolerance  Patient tolerated treatment well       Past Medical History:  Diagnosis Date  . Arthritis   . Depression   . Diabetes mellitus without complication ([place])   . Diverticulitis   . GERD (gastroesophageal reflux disease)   . Headache   . Heart murmur   . Hyperlipidemia   . Hypertension   . Speech problem   . Wears glasses     Past Surgical History:  Procedure Laterality Date  . CHOLECYSTECTOMY N/A 12/05/2014   Procedure: LAPAROSCOPIC CHOLECYSTECTOMY;  Surgeon: Rolm Bookbinder, MD;  Location: [facility];  Service: General;  Laterality: N/A;  . SHOULDER ARTHROSCOPY WITH SUBACROMIAL DECOMPRESSION AND BICEP TENDON REPAIR Right 04/23/2013   Procedure: SHOULDER ARTHROSCOPY WITH SUBACROMIAL DECOMPRESSION AND BICEP TENDON TENOTOMY;  Surgeon: Nita Sells, MD;  Location: [facility];  Service: Orthopedics;  Laterality: Right;    There were no vitals filed for this visit.  Subjective Assessment - 05/01/19 1415    Subjective  "Work in the inside-makes-me-tired."    Currently in Pain?  No/denies            ADULT SLP TREATMENT - 05/02/19 0929      General Information   Behavior/Cognition  Alert;Cooperative;Pleasant mood      Treatment Provided   Treatment provided  Cognitive-Linquistic      Pain Assessment   Pain Assessment  No/denies pain       Cognitive-Linquistic Treatment   Treatment focused on  Apraxia;Aphasia;Patient/family/caregiver education    Skilled Treatment  Patient created icon on her device in her "speech therapy" folder to ask SLP about difficulties she had with computer generated voice/volume (she noted voice had changed for only some icons). SLP assisted pt with applying her selected voice to all icons. Pt added content to "music" section in conversation topics, and used her device to tell SLP about 2 concerts she had attended (several sentences, each story). Patient had created icons for "practice" including several words including: riots, agressive, peaceful, dictator. SLP demo'd how instead of "practicing" these words, pt could create an icon for each one and write a sentence about her thoughts/opinions to aid her in conversing about current events with others. Pt to work on this at home.      Assessment / Recommendations / Plan   Plan  Goals updated      Progression Toward Goals   Progression toward goals  Progressing toward goals       SLP Education - 05/02/19 0935    Education Details  aim to use device for conversation/interaction not just "practice"    Person(s) Educated  Patient    Methods  Explanation    Comprehension  Verbalized understanding       SLP Short Term Goals - 05/02/19 4174  SLP SHORT TERM GOAL #1   Title  Pt will retrieve 9/10 personally relevant words/images on AAC system in response to open ended questions with rare min A x 3 sessions    Status  Achieved      SLP SHORT TERM GOAL #2   Title  pt will use scripting or SGD to successfully convey her food order in role-play scenario x3 sessions.    Baseline  02/20/19, 02-27-19    Status  Partially Met   believe pt would have met goal had this been done on 03-01-19     SLP SHORT TERM GOAL #3   Title  Pt will use compensations for anomia/apraxia in 5 minutes simple conversation 80% of the time over 3 sessions.    Status  Partially Met        SLP Long Term Goals - 05/02/19 0924      SLP LONG TERM GOAL #1   Title  Pt will relay personal or medical information in functional scenarios (using scripting or SGD if necessary) x3 sessions.    Baseline  05/02/19    Time  1    Period  Weeks    Status  On-going      SLP LONG TERM GOAL #2   Title  pt will report success when ordering food on the phone or at the drive through using script or SGD x 3 sessions    Baseline  02-27-19    Time  1    Period  Weeks    Status  On-going      SLP LONG TERM GOAL #3   Title  pt will demo 10 minutes functional simple to mod complex conversation with modified independence (using compensations and/or SGD) over three sessions    Baseline  05/02/19    Time  2    Period  Weeks    Status  On-going      SLP LONG TERM GOAL #4   Title  Patient will report using SGD in conversation with friends or family in person or over the phone, x3 sessions.    Time  4    Period  Weeks    Status  New       Plan - 05/02/19 0925    Clinical Impression Statement  Ms. Crafton presents with mild-moderate overall difficulty with expressive speech today. Patient continues to add content to her device to aid in communication with others; mod cues/demonstrate from SLP today to create more conversational content from an icon pt had added with "words to practice". Pt now with her own device; I recommend cont'd skilled ST for training in compensations for anomia and verbal apraxia, and for training/carryover in use of a speech-generating device given diagnosis of progressive neurologic disease.    Speech Therapy Frequency  2x / week   will reduce frequency to 1x per week beginning 05/08/19   Duration  2 weeks   8 weeks or 17 visits   Treatment/Interventions  Cueing hierarchy;Functional tasks;Patient/family education;Environmental controls;Cognitive reorganization;Multimodal communcation approach;Language facilitation;Compensatory techniques;Internal/external aids;SLP  instruction and feedback    Potential to Achieve Goals  Good    Consulted and Agree with Plan of Care  Patient       Patient will benefit from skilled therapeutic intervention in order to improve the following deficits and impairments:   Primary progressive aphasia [facility])  Verbal apraxia    Problem List Patient Active Problem List   Diagnosis Date Noted  . Primary progressive aphasia ([place]) 05/25/2018  .  Expressive aphasia 09/15/2017  . Migraine 10/13/2016  . Insomnia 10/13/2016  . Essential hypertension 10/13/2016  . Moderate episode of recurrent major depressive disorder ([place]) 05/12/2016  . Small vessel disease, cerebrovascular 12/22/2015  . Slurred speech 07/24/2015  . Abnormal CT of brain 07/08/2015  . Speech abnormality 06/20/2015  . Thyroid nodule 06/20/2015  . S/P laparoscopic cholecystectomy 12/05/2014  . Heart murmur 02/11/2013  . Type 2 diabetes mellitus, controlled ([place]) 02/09/2013  . Other and unspecified hyperlipidemia 02/09/2013   Deneise Lever, [place], [place] 05/02/2019, 9:37 AM  [facility] [address] [place], [place], [address] Phone: [phone]   Fax:  [phone]   Name: Meghan Hickman MRN: [id number] Date of Birth: 12-Apr-1951

## 2019-05-03 ENCOUNTER — Ambulatory Visit: Payer: Medicare Other | Admitting: Speech Pathology

## 2019-05-03 ENCOUNTER — Other Ambulatory Visit: Payer: Self-pay

## 2019-05-03 DIAGNOSIS — F0280 Dementia in other diseases classified elsewhere without behavioral disturbance: Secondary | ICD-10-CM

## 2019-05-03 DIAGNOSIS — G3101 Pick's disease: Secondary | ICD-10-CM | POA: Diagnosis not present

## 2019-05-03 DIAGNOSIS — R482 Apraxia: Secondary | ICD-10-CM

## 2019-05-03 NOTE — Therapy (Signed)
[facility] [address] [place], [place], [address] Phone: [phone]   Fax:  [phone]  Speech Language Pathology Treatment  Patient Details  Name: Meghan Hickman MRN: [id number] Date of Birth: [birth year] Referring Provider (SLP): Ward Givens, NP   Encounter Date: 05/03/2019  End of Session - 05/03/19 1453    Visit Number  15    Number of Visits  17    Date for SLP Re-Evaluation  06/08/19    Authorization Type  BCBS, Medicare    SLP Start Time  1405    SLP Stop Time   1450    SLP Time Calculation (min)  45 min    Activity Tolerance  Patient tolerated treatment well       Past Medical History:  Diagnosis Date  . Arthritis   . Depression   . Diabetes mellitus without complication ([place])   . Diverticulitis   . GERD (gastroesophageal reflux disease)   . Headache   . Heart murmur   . Hyperlipidemia   . Hypertension   . Speech problem   . Wears glasses     Past Surgical History:  Procedure Laterality Date  . CHOLECYSTECTOMY N/A 12/05/2014   Procedure: LAPAROSCOPIC CHOLECYSTECTOMY;  Surgeon: Rolm Bookbinder, MD;  Location: [facility];  Service: General;  Laterality: N/A;  . SHOULDER ARTHROSCOPY WITH SUBACROMIAL DECOMPRESSION AND BICEP TENDON REPAIR Right 04/23/2013   Procedure: SHOULDER ARTHROSCOPY WITH SUBACROMIAL DECOMPRESSION AND BICEP TENDON TENOTOMY;  Surgeon: Nita Sells, MD;  Location: [facility];  Service: Orthopedics;  Laterality: Right;    There were no vitals filed for this visit.  Subjective Assessment - 05/03/19 1408    Subjective  "I feel rain... but it's- uh- not- raining."    Currently in Pain?  No/denies            ADULT SLP TREATMENT - 05/03/19 1453      General Information   Behavior/Cognition  Alert;Cooperative;Pleasant mood      Treatment Provided   Treatment provided  Cognitive-Linquistic      Cognitive-Linquistic Treatment   Treatment  focused on  Apraxia;Aphasia;Patient/family/caregiver education    Skilled Treatment  Pt reports using SGD at [employer]. She tried to use it at [employer] but "they were rushing me." SLP suggested pt have her device out and ready on the passenger seat prior to going through the drive-thru, and pt agreed she would try this. In mod complex conversation today with SLP, pt created icons in real time (SLP allowing extended time) to express her thoughts about recent current events and protests happening in [place]. Pt did not add new items to "news" section, so SLP told pt to imagine having a conversation similar to today's with SLP about other current events, and to create icons re: her thoughts and ideas. SLP asked pt to generate questions with her device that she may want to ask her communication partner about topics in the news.        Assessment / Recommendations / Plan   Plan  Continue with current plan of care      Progression Toward Goals   Progression toward goals  Progressing toward goals         SLP Short Term Goals - 05/03/19 1436      SLP SHORT TERM GOAL #1   Title  Pt will retrieve 9/10 personally relevant words/images on AAC system in response to open ended questions with rare min A x 3 sessions  Status  Achieved      SLP SHORT TERM GOAL #2   Title  pt will use scripting or SGD to successfully convey her food order in role-play scenario x3 sessions.    Baseline  02/20/19, 02-27-19    Status  Partially Met   believe pt would have met goal had this been done on 03-01-19     SLP SHORT TERM GOAL #3   Title  Pt will use compensations for anomia/apraxia in 5 minutes simple conversation 80% of the time over 3 sessions.    Status  Partially Met       SLP Long Term Goals - 05/03/19 1436      SLP LONG TERM GOAL #1   Title  Pt will relay personal or medical information in functional scenarios (using scripting or SGD if necessary) x3 sessions.    Baseline  05/01/19 05/03/19    Time  1     Period  Weeks    Status  Partially Met      SLP LONG TERM GOAL #2   Title  pt will report success when ordering food on the phone or at the drive through using script or SGD x 3 sessions    Baseline  02-27-19, 05/03/19    Time  1    Period  Weeks    Status  Partially Met      SLP LONG TERM GOAL #3   Title  pt will demo 10 minutes functional simple to mod complex conversation with modified independence (using compensations and/or SGD) over three sessions    Baseline  05/01/19, 05/03/19    Time  2    Period  Weeks    Status  On-going      SLP LONG TERM GOAL #4   Title  Patient will report using SGD in conversation with friends or family in person or over the phone, x3 sessions.    Baseline  05/03/19    Time  4    Period  Weeks    Status  On-going       Plan - 05/03/19 1454    Clinical Impression Statement  Ms. Shishido presents with mild-moderate overall difficulty with expressive speech today. Patient continues to add content to her device to aid in communication with others. Pt used device with extended time to respond to questions in mod complex conversation with SLP; responses were notably aphasic but comprehensible. I recommend cont'd skilled ST for training in compensations for anomia and verbal apraxia, and for training/carryover in use of a speech-generating device given diagnosis of progressive neurologic disease. Anticipate d/c in next 2-3 sessions.    Speech Therapy Frequency  2x / week   will reduce frequency to 1x per week beginning 05/08/19   Duration  2 weeks   8 weeks or 17 visits   Treatment/Interventions  Cueing hierarchy;Functional tasks;Patient/family education;Environmental controls;Cognitive reorganization;Multimodal communcation approach;Language facilitation;Compensatory techniques;Internal/external aids;SLP instruction and feedback    Potential to Achieve Goals  Good    Consulted and Agree with Plan of Care  Patient       Patient will benefit from skilled  therapeutic intervention in order to improve the following deficits and impairments:   Primary progressive aphasia [facility])  Verbal apraxia    Problem List Patient Active Problem List   Diagnosis Date Noted  . Primary progressive aphasia ([place]) 05/25/2018  . Expressive aphasia 09/15/2017  . Migraine 10/13/2016  . Insomnia 10/13/2016  . Essential hypertension 10/13/2016  . Moderate episode of recurrent  major depressive disorder ([place]) 05/12/2016  . Small vessel disease, cerebrovascular 12/22/2015  . Slurred speech 07/24/2015  . Abnormal CT of brain 07/08/2015  . Speech abnormality 06/20/2015  . Thyroid nodule 06/20/2015  . S/P laparoscopic cholecystectomy 12/05/2014  . Heart murmur 02/11/2013  . Type 2 diabetes mellitus, controlled ([place]) 02/09/2013  . Other and unspecified hyperlipidemia 02/09/2013   Deneise Lever, [place], [place] Pete Merten 05/03/2019, 2:57 PM  [facility] [address] [place], [place], [address] Phone: [phone]   Fax:  [phone]   Name: LAURENASHLEY VIAR MRN: [id number] Date of Birth: March 31, 1951

## 2019-05-08 ENCOUNTER — Other Ambulatory Visit: Payer: Self-pay

## 2019-05-08 ENCOUNTER — Ambulatory Visit: Payer: Medicare Other | Admitting: Speech Pathology

## 2019-05-08 DIAGNOSIS — G3101 Pick's disease: Secondary | ICD-10-CM

## 2019-05-08 DIAGNOSIS — F0280 Dementia in other diseases classified elsewhere without behavioral disturbance: Secondary | ICD-10-CM

## 2019-05-08 DIAGNOSIS — R482 Apraxia: Secondary | ICD-10-CM

## 2019-05-09 NOTE — Therapy (Signed)
[facility] [address] [place], [place], [address] Phone: [phone]   Fax:  [phone]  Speech Language Pathology Treatment  Patient Details  Name: Meghan Hickman MRN: [id number] Date of Birth: 06-29-1951 Referring Provider (SLP): Ward Givens, NP   Encounter Date: 05/08/2019  End of Session - 05/08/19 1435    Visit Number  16    Number of Visits  17    Date for SLP Re-Evaluation  06/08/19    Authorization Type  BCBS, Medicare    SLP Start Time  6734    SLP Stop Time   1445    SLP Time Calculation (min)  43 min    Activity Tolerance  Patient tolerated treatment well       Past Medical History:  Diagnosis Date  . Arthritis   . Depression   . Diabetes mellitus without complication ([place])   . Diverticulitis   . GERD (gastroesophageal reflux disease)   . Headache   . Heart murmur   . Hyperlipidemia   . Hypertension   . Speech problem   . Wears glasses     Past Surgical History:  Procedure Laterality Date  . CHOLECYSTECTOMY N/A 12/05/2014   Procedure: LAPAROSCOPIC CHOLECYSTECTOMY;  Surgeon: Rolm Bookbinder, MD;  Location: [facility];  Service: General;  Laterality: N/A;  . SHOULDER ARTHROSCOPY WITH SUBACROMIAL DECOMPRESSION AND BICEP TENDON REPAIR Right 04/23/2013   Procedure: SHOULDER ARTHROSCOPY WITH SUBACROMIAL DECOMPRESSION AND BICEP TENDON TENOTOMY;  Surgeon: Nita Sells, MD;  Location: [facility];  Service: Orthopedics;  Laterality: Right;    There were no vitals filed for this visit.  Subjective Assessment - 05/08/19 1410    Subjective  "I'm hiding in my house."    Currently in Pain?  No/denies            ADULT SLP TREATMENT - 05/08/19 1414      General Information   Behavior/Cognition  Alert;Cooperative;Pleasant mood      Treatment Provided   Treatment provided  Cognitive-Linquistic      Cognitive-Linquistic Treatment   Treatment focused on   Apraxia;Aphasia;Patient/family/caregiver education    Skilled Treatment  Pt programmed icon on her device which she shared with SLP: "I went to [place] after I left and asked for jelly. The person did not understand so I showed them picture on the device." SLP praised pt for this and encouraged her to continue using device outside of ST. Demonstrated using comma in text to slow voice output to increase clarity of her message. At home pt added several icons re: her garden and a narrative about a frog that visits her on her patio. Pt accepted phone call during session Adella Hare consultant calling to ask about which type of keyboard pt would like for her device). Pt with significant expressive difficulties and handed her phone to SLP. SLP placed phone on speaker and assisted pt with call. Afterwards, SLP demo'd icons in "telephone" section pt could have used to request more time to formulate her responses, and pt agreed she should try this. SLP talked with pt re: POC and that pt is about to meet LTGs and likely ready for d/c soon. Pt expressed that she still has questions about how to operate her device. SLP told pt to make icons on her device to ask these questions to SLP next session so that we may address.       Assessment / Recommendations / Plan   Plan  Continue with current plan  of care      Progression Toward Goals   Progression toward goals  Progressing toward goals       SLP Education - 05/09/19 [phone]    Education Details  use device to request more time (on the phone and in person), progress toward goals    Person(s) Educated  Patient    Methods  Explanation    Comprehension  Verbalized understanding       SLP Short Term Goals - 05/09/19 0844      SLP SHORT TERM GOAL #1   Title  Pt will retrieve 9/10 personally relevant words/images on AAC system in response to open ended questions with rare min A x 3 sessions    Status  Achieved      SLP SHORT TERM GOAL #2   Title  pt will use  scripting or SGD to successfully convey her food order in role-play scenario x3 sessions.    Baseline  02/20/19, 02-27-19    Status  Partially Met   believe pt would have met goal had this been done on 03-01-19     SLP SHORT TERM GOAL #3   Title  Pt will use compensations for anomia/apraxia in 5 minutes simple conversation 80% of the time over 3 sessions.    Status  Partially Met       SLP Long Term Goals - 05/09/19 0844      SLP LONG TERM GOAL #1   Title  Pt will relay personal or medical information in functional scenarios (using scripting or SGD if necessary) x3 sessions.    Baseline  05/01/19 05/03/19    Time  1    Period  Weeks    Status  Partially Met      SLP LONG TERM GOAL #2   Title  pt will report success when ordering food on the phone or at the drive through using script or SGD x 3 sessions    Baseline  02-27-19, 05/03/19    Time  1    Period  Weeks    Status  Partially Met      SLP LONG TERM GOAL #3   Title  pt will demo 10 minutes functional simple to mod complex conversation with modified independence (using compensations and/or SGD) over three sessions    Baseline  05/01/19, 05/03/19, 05/08/19    Time  1    Period  Weeks    Status  Achieved      SLP LONG TERM GOAL #4   Title  Patient will report using SGD in conversation with friends or family in person or over the phone, x3 sessions.    Baseline  05/03/19    Time  3    Period  Weeks    Status  On-going       Plan - 05/09/19 0843    Clinical Impression Statement  Ms. Klink presents with mild-moderate overall difficulty with expressive speech today, though significant difficulty under time pressure when using the phone. Patient continues to add content to her device to aid in communication with others. Pt used device with extended time to respond to and ask questions in mod complex conversation with SLP; responses were notably aphasic but comprehensible. I recommend cont'd skilled ST for training in compensations  for anomia and verbal apraxia, and for training/carryover in use of a speech-generating device given diagnosis of progressive neurologic disease. Anticipate d/c in next 2-3 sessions    Speech Therapy Frequency  2x / week  will reduce frequency to 1x per week beginning 05/08/19   Duration  2 weeks   8 weeks or 17 visits   Treatment/Interventions  Cueing hierarchy;Functional tasks;Patient/family education;Environmental controls;Cognitive reorganization;Multimodal communcation approach;Language facilitation;Compensatory techniques;Internal/external aids;SLP instruction and feedback    Potential to Achieve Goals  Good    Consulted and Agree with Plan of Care  Patient       Patient will benefit from skilled therapeutic intervention in order to improve the following deficits and impairments:   Primary progressive aphasia [facility])  Verbal apraxia    Problem List Patient Active Problem List   Diagnosis Date Noted  . Primary progressive aphasia ([place]) 05/25/2018  . Expressive aphasia 09/15/2017  . Migraine 10/13/2016  . Insomnia 10/13/2016  . Essential hypertension 10/13/2016  . Moderate episode of recurrent major depressive disorder ([place]) 05/12/2016  . Small vessel disease, cerebrovascular 12/22/2015  . Slurred speech 07/24/2015  . Abnormal CT of brain 07/08/2015  . Speech abnormality 06/20/2015  . Thyroid nodule 06/20/2015  . S/P laparoscopic cholecystectomy 12/05/2014  . Heart murmur 02/11/2013  . Type 2 diabetes mellitus, controlled ([place]) 02/09/2013  . Other and unspecified hyperlipidemia 02/09/2013   Deneise Lever, [place], [place] 05/09/2019, 8:45 AM  [facility] [address] [place], [place], [address] Phone: [phone]   Fax:  [phone]   Name: LAILAH MARCELLI MRN: [id number] Date of Birth: 31-Dec-1950

## 2019-05-11 ENCOUNTER — Other Ambulatory Visit: Payer: Self-pay | Admitting: Family Medicine

## 2019-05-11 DIAGNOSIS — I10 Essential (primary) hypertension: Secondary | ICD-10-CM

## 2019-05-11 NOTE — Telephone Encounter (Signed)
Requested medication (s) are due for refill today: yes  Requested medication (s) are on the active medication list: yes  Last refill:  12/30/2018  Future visit scheduled: no  Notes to clinic: review for refill  Requested Prescriptions  Pending Prescriptions Disp Refills   losartan-hydrochlorothiazide (HYZAAR) 50-12.5 MG tablet [Pharmacy Med Name: LOSARTAN-HCTZ 50-12.5 MG TAB] 45 tablet 1    Sig: TAKE 1/2 TABLET BY MOUTH EVERY DAY     Cardiovascular: ARB + Diuretic Combos Failed - 05/11/2019  6:45 AM      Failed - K in normal range and within 180 days    Potassium  Date Value Ref Range Status  09/13/2018 4.2 3.5 - 5.2 mmol/L Final         Failed - Na in normal range and within 180 days    Sodium  Date Value Ref Range Status  09/13/2018 144 134 - 144 mmol/L Final         Failed - Cr in normal range and within 180 days    Creat  Date Value Ref Range Status  10/30/2015 0.73 0.50 - 0.99 mg/dL Final   Creatinine, Ser  Date Value Ref Range Status  09/13/2018 0.86 0.57 - 1.00 mg/dL Final         Failed - Ca in normal range and within 180 days    Calcium  Date Value Ref Range Status  09/13/2018 9.2 8.7 - 10.3 mg/dL Final         Passed - Patient is not pregnant      Passed - Last BP in normal range    BP Readings from Last 1 Encounters:  09/13/18 115/74         Passed - Valid encounter within last 6 months    Recent Outpatient Visits          5 months ago RLS (restless legs syndrome)   Primary [facility] at [facility], Zoe A, MD   8 months ago Controlled type 2 diabetes mellitus with diabetic nephropathy, without long-term current use of insulin ([place])   Primary Care at [facility], Zoe A, MD   1 year ago Controlled type 2 diabetes mellitus with diabetic nephropathy, without long-term current use of insulin ([facility])   Primary [facility] at [facility], Arlie Solomons, MD   1 year ago Diverticulitis of colon   Primary Care at [facility], Ines Bloomer, MD   1 year ago  Diverticulitis of colon   Primary Care at [facility], [place]

## 2019-05-15 ENCOUNTER — Other Ambulatory Visit: Payer: Self-pay

## 2019-05-15 ENCOUNTER — Ambulatory Visit: Payer: Medicare Other | Attending: Adult Health | Admitting: Speech Pathology

## 2019-05-15 DIAGNOSIS — F0280 Dementia in other diseases classified elsewhere without behavioral disturbance: Secondary | ICD-10-CM | POA: Insufficient documentation

## 2019-05-15 DIAGNOSIS — G3101 Pick's disease: Secondary | ICD-10-CM | POA: Diagnosis present

## 2019-05-15 DIAGNOSIS — R482 Apraxia: Secondary | ICD-10-CM

## 2019-05-15 NOTE — Therapy (Signed)
[facility] [address] [place], [place], [address] Phone: [phone]   Fax:  [phone]  Speech Language Pathology Treatment  Patient Details  Name: Meghan Hickman MRN: [id number] Date of Birth: 11/27/1950 Referring Provider (SLP): Ward Givens, NP   Encounter Date: 05/15/2019  End of Session - 05/15/19 1831    Visit Number  17    Number of Visits  19   recert 2 additional sessions 05/15/19   Date for SLP Re-Evaluation  06/08/19       Past Medical History:  Diagnosis Date  . Arthritis   . Depression   . Diabetes mellitus without complication ([place])   . Diverticulitis   . GERD (gastroesophageal reflux disease)   . Headache   . Heart murmur   . Hyperlipidemia   . Hypertension   . Speech problem   . Wears glasses     Past Surgical History:  Procedure Laterality Date  . CHOLECYSTECTOMY N/A 12/05/2014   Procedure: LAPAROSCOPIC CHOLECYSTECTOMY;  Surgeon: Rolm Bookbinder, MD;  Location: [facility];  Service: General;  Laterality: N/A;  . SHOULDER ARTHROSCOPY WITH SUBACROMIAL DECOMPRESSION AND BICEP TENDON REPAIR Right 04/23/2013   Procedure: SHOULDER ARTHROSCOPY WITH SUBACROMIAL DECOMPRESSION AND BICEP TENDON TENOTOMY;  Surgeon: Nita Sells, MD;  Location: [facility];  Service: Orthopedics;  Laterality: Right;    There were no vitals filed for this visit.  Subjective Assessment - 05/15/19 1825    Subjective  "They understand what the tablet's telling"    Currently in Pain?  No/denies            ADULT SLP TREATMENT - 05/15/19 1825      General Information   Behavior/Cognition  Alert;Cooperative;Pleasant mood      Treatment Provided   Treatment provided  Cognitive-Linquistic      Cognitive-Linquistic Treatment   Treatment focused on  Apraxia;Aphasia;Patient/family/caregiver education    Skilled Treatment  Pt reports successful interactions at [employer] since last visit  (see "S"). She reports using the device when speaking with her cousin on the phone to request a pause to use the restroom. SLP assisted pt with adding additional icons to request speaking turn and to exit the conversation as pt reports cousin talks for "hours" and she has difficulty interrupting. Bluetooth keyboard arrived; SLP demo'd how to pair this to her device. Pt used keyboard to type in chat box questions to SLP "What else does device do." SLP reviewed main features of the device, which pt is using quite well at this time. Pt had used whiteboard to draw stick figures to explain more details about a story she told SLP last session. Pt had question at end of session re: use of email; will review this next session.      Assessment / Recommendations / Plan   Plan  Continue with current plan of care   recert for 2 additional sessions     Progression Toward Goals   Progression toward goals  Progressing toward goals       SLP Education - 05/15/19 1833    Education Details  bluetooth keyboard, review of SGD capabilities    Person(s) Educated  Patient    Methods  Explanation;Demonstration    Comprehension  Verbalized understanding       SLP Short Term Goals - 05/15/19 1830      SLP SHORT TERM GOAL #1   Title  Pt will retrieve 9/10 personally relevant words/images on AAC system in response to open  ended questions with rare min A x 3 sessions    Status  Achieved      SLP SHORT TERM GOAL #2   Title  pt will use scripting or SGD to successfully convey her food order in role-play scenario x3 sessions.    Baseline  02/20/19, 02-27-19    Status  Partially Met   believe pt would have met goal had this been done on 03-01-19     SLP SHORT TERM GOAL #3   Title  Pt will use compensations for anomia/apraxia in 5 minutes simple conversation 80% of the time over 3 sessions.    Status  Partially Met       SLP Long Term Goals - 05/15/19 1830      SLP LONG TERM GOAL #1   Title  Pt will relay  personal or medical information in functional scenarios (using scripting or SGD if necessary) x3 sessions.    Baseline  05/01/19 05/03/19    Time  1    Period  Weeks    Status  Partially Met      SLP LONG TERM GOAL #2   Title  pt will report success when ordering food on the phone or at the drive through using script or SGD x 3 sessions    Baseline  02-27-19, 05/03/19    Time  1    Period  Weeks    Status  Partially Met      SLP LONG TERM GOAL #3   Title  pt will demo 10 minutes functional simple to mod complex conversation with modified independence (using compensations and/or SGD) over three sessions    Baseline  05/01/19, 05/03/19, 05/08/19    Time  1    Period  Weeks    Status  Achieved      SLP LONG TERM GOAL #4   Title  Patient will report using SGD in conversation with friends or family in person or over the phone, x3 sessions.    Baseline  05/03/19, 05/15/19    Time  2    Period  Weeks    Status  On-going       Plan - 05/15/19 1832    Clinical Impression Statement  Ms. Malatesta presents with mild-moderate overall difficulty with expressive speech today, though significant difficulty under time pressure when using the phone. Patient continues to add content to her device to aid in communication with others. Pt used device with extended time to respond to and ask questions in mod complex conversation with SLP; responses were notably aphasic but comprehensible. I recommend cont'd skilled ST for training in compensations for anomia and verbal apraxia, and for training/carryover in use of a speech-generating device given diagnosis of progressive neurologic disease. Anticipate d/c in next 1-2 sessions.    Speech Therapy Frequency  1x /week   will reduce frequency to 1x per week beginning 05/08/19   Duration  --   19 total visits   Treatment/Interventions  Cueing hierarchy;Functional tasks;Patient/family education;Environmental controls;Cognitive reorganization;Multimodal communcation  approach;Language facilitation;Compensatory techniques;Internal/external aids;SLP instruction and feedback    Potential to Achieve Goals  Good    Consulted and Agree with Plan of Care  Patient       Patient will benefit from skilled therapeutic intervention in order to improve the following deficits and impairments:   Primary progressive aphasia [facility])  Verbal apraxia    Problem List Patient Active Problem List   Diagnosis Date Noted  . Primary progressive aphasia ([place]) 05/25/2018  .  Expressive aphasia 09/15/2017  . Migraine 10/13/2016  . Insomnia 10/13/2016  . Essential hypertension 10/13/2016  . Moderate episode of recurrent major depressive disorder ([place]) 05/12/2016  . Small vessel disease, cerebrovascular 12/22/2015  . Slurred speech 07/24/2015  . Abnormal CT of brain 07/08/2015  . Speech abnormality 06/20/2015  . Thyroid nodule 06/20/2015  . S/P laparoscopic cholecystectomy 12/05/2014  . Heart murmur 02/11/2013  . Type 2 diabetes mellitus, controlled ([place]) 02/09/2013  . Other and unspecified hyperlipidemia 02/09/2013   Deneise Lever, [place], [place] 05/15/2019, 6:37 PM  [facility] [address] [place], [place], [address] Phone: [phone]   Fax:  [phone]   Name: TYRIANNA LIGHTLE MRN: [id number] Date of Birth: [birth year]

## 2019-05-22 ENCOUNTER — Ambulatory Visit: Payer: Medicare Other | Admitting: Speech Pathology

## 2019-05-29 ENCOUNTER — Other Ambulatory Visit: Payer: Self-pay

## 2019-05-29 ENCOUNTER — Ambulatory Visit: Payer: Medicare Other | Admitting: Speech Pathology

## 2019-05-29 DIAGNOSIS — F0280 Dementia in other diseases classified elsewhere without behavioral disturbance: Secondary | ICD-10-CM

## 2019-05-29 DIAGNOSIS — G3101 Pick's disease: Secondary | ICD-10-CM

## 2019-05-29 DIAGNOSIS — R482 Apraxia: Secondary | ICD-10-CM

## 2019-05-30 ENCOUNTER — Telehealth (INDEPENDENT_AMBULATORY_CARE_PROVIDER_SITE_OTHER): Payer: Federal, State, Local not specified - PPO | Admitting: Family Medicine

## 2019-05-30 ENCOUNTER — Encounter: Payer: Self-pay | Admitting: Family Medicine

## 2019-05-30 DIAGNOSIS — E1165 Type 2 diabetes mellitus with hyperglycemia: Secondary | ICD-10-CM

## 2019-05-30 DIAGNOSIS — R4789 Other speech disturbances: Secondary | ICD-10-CM

## 2019-05-30 DIAGNOSIS — G43709 Chronic migraine without aura, not intractable, without status migrainosus: Secondary | ICD-10-CM

## 2019-05-30 MED ORDER — TRAMADOL HCL 50 MG PO TABS: 50.0000 mg | ORAL_TABLET | Freq: Four times a day (QID) | ORAL | 0 refills | Status: DC | PRN

## 2019-05-30 MED ORDER — FAMOTIDINE 40 MG PO TABS: 40.0000 mg | ORAL_TABLET | Freq: Every day | ORAL | 3 refills | Status: DC

## 2019-05-30 MED ORDER — TEMAZEPAM 30 MG PO CAPS: ORAL_CAPSULE | ORAL | 2 refills | Status: DC

## 2019-05-30 MED ORDER — BLOOD GLUCOSE METER KIT: PACK | 0 refills | Status: AC

## 2019-05-30 NOTE — Therapy (Signed)
[facility] [address] [place], [place], [address] Phone: [phone]   Fax:  [phone]  Speech Language Pathology Treatment  Patient Details  Name: Meghan Hickman MRN: [id number] Date of Birth: [birth year] Referring Provider (SLP): Ward Givens, NP   Encounter Date: 05/29/2019  End of Session - 05/30/19 0851    Visit Number  18    Number of Visits  19    Date for SLP Re-Evaluation  06/08/19    Authorization Type  BCBS, Medicare    SLP Start Time  1615    SLP Stop Time   1700    SLP Time Calculation (min)  45 min       Past Medical History:  Diagnosis Date  . Arthritis   . Depression   . Diabetes mellitus without complication ([facility])   . Diverticulitis   . GERD (gastroesophageal reflux disease)   . Headache   . Heart murmur   . Hyperlipidemia   . Hypertension   . Speech problem   . Wears glasses     Past Surgical History:  Procedure Laterality Date  . CHOLECYSTECTOMY N/A 12/05/2014   Procedure: LAPAROSCOPIC CHOLECYSTECTOMY;  Surgeon: Rolm Bookbinder, MD;  Location: [facility];  Service: General;  Laterality: N/A;  . SHOULDER ARTHROSCOPY WITH SUBACROMIAL DECOMPRESSION AND BICEP TENDON REPAIR Right 04/23/2013   Procedure: SHOULDER ARTHROSCOPY WITH SUBACROMIAL DECOMPRESSION AND BICEP TENDON TENOTOMY;  Surgeon: Nita Sells, MD;  Location: [facility];  Service: Orthopedics;  Laterality: Right;    There were no vitals filed for this visit.  Subjective Assessment - 05/30/19 0837    Subjective  "I did a lot on here."    Currently in Pain?  No/denies            ADULT SLP TREATMENT - 05/30/19 0838      General Information   Behavior/Cognition  Alert;Cooperative;Pleasant mood      Treatment Provided   Treatment provided  Cognitive-Linquistic      Pain Assessment   Pain Assessment  No/denies pain      Cognitive-Linquistic Treatment   Treatment focused on   Apraxia;Aphasia;Patient/family/caregiver education    Skilled Treatment  Patient showed SLP new icons she created on her device, including ones she prepared to interact with her PCP at next appointment, explaining her speech has deteriorated and that she is using SGD "as a backup" for the appointment. Had also added some questions to ask about her medications and some physical symptoms. Pt reports using device over the phone to cancel her ST appointment last week. Pt had also created icons in speech therapy section to ask SLP about her device, including how to move text to a different icon and requesting assistance with how to organize icons on her device for easier access. SLP explained suggestions for how to categorize some of the icons pt had created, and then demonstrated organizing icons in her "telephone section", by creating additional "scheduling appointments" section. Encouraged pt to consider making icons for multiple uses rather than one-time use, and using workspace as a "phrase builder." Pt returned demonstrated with use of workspace to move icons and used at checkout successfully to schedule next [facility] appointment.      Assessment / Recommendations / Plan   Plan  Continue with current plan of care;Goals updated      Progression Toward Goals   Progression toward goals  Progressing toward goals       SLP Education - 05/30/19 4268  Education Details  device organization, phrase/conversation building in Copy) Educated  Patient    Methods  Explanation;Demonstration    Comprehension  Verbalized understanding;Returned demonstration;Need further instruction       SLP Short Term Goals - 05/30/19 1610      SLP SHORT TERM GOAL #1   Title  Pt will retrieve 9/10 personally relevant words/images on AAC system in response to open ended questions with rare min A x 3 sessions    Status  Achieved      SLP SHORT TERM GOAL #2   Title  pt will use scripting or SGD to successfully  convey her food order in role-play scenario x3 sessions.    Baseline  02/20/19, 02-27-19    Status  Partially Met   believe pt would have met goal had this been done on 03-01-19     SLP SHORT TERM GOAL #3   Title  Pt will use compensations for anomia/apraxia in 5 minutes simple conversation 80% of the time over 3 sessions.    Status  Partially Met       SLP Long Term Goals - 05/30/19 9604      SLP LONG TERM GOAL #1   Title  Pt will relay personal or medical information in functional scenarios (using scripting or SGD if necessary) x3 sessions.    Baseline  05/01/19 05/03/19    Time  1    Period  Weeks    Status  Partially Met      SLP LONG TERM GOAL #2   Title  pt will report success when ordering food on the phone or at the drive through using script or SGD x 3 sessions    Baseline  02-27-19, 05/03/19    Time  1    Period  Weeks    Status  Partially Met      SLP LONG TERM GOAL #3   Title  pt will demo 10 minutes functional simple to mod complex conversation with modified independence (using compensations and/or SGD) over three sessions    Baseline  05/01/19, 05/03/19, 05/08/19    Time  1    Period  Weeks    Status  Achieved      SLP LONG TERM GOAL #4   Title  Patient will report using SGD in conversation with friends or family in person or over the phone, x3 sessions.    Baseline  05/03/19, 05/15/19, 05/30/19    Time  1    Period  Weeks    Status  Achieved      SLP LONG TERM GOAL #5   Title  Pt will demo use of workspace to move icons and build phrases with mod I.    Time  1    Period  Weeks    Status  New       Plan - 05/30/19 0854    Clinical Impression Statement  [name] presents with mild-moderate overall difficulty with expressive speech today, though significant difficulty under time pressure when using the phone. Patient demonstrating increased confidence in using her device to communicate outside of ST in person, over the phone, and at [employer].  LTGs met; additional LTG added per pt request to increase ability and confidence in organizing icons and building phrases for conversations. I recommend cont'd skilled ST for training in compensations for anomia and verbal apraxia, and for training/carryover in use of a speech-generating device given diagnosis of progressive neurologic disease. Anticipate d/c in  next 1-2 sessions.    Speech Therapy Frequency  1x /week   will reduce frequency to 1x per week beginning 05/08/19   Duration  --   19 total visits   Treatment/Interventions  Cueing hierarchy;Functional tasks;Patient/family education;Environmental controls;Cognitive reorganization;Multimodal communcation approach;Language facilitation;Compensatory techniques;Internal/external aids;SLP instruction and feedback    Potential to Achieve Goals  Good    Consulted and Agree with Plan of Care  Patient       Patient will benefit from skilled therapeutic intervention in order to improve the following deficits and impairments:   Primary progressive aphasia [facility])  Verbal apraxia    Problem List Patient Active Problem List   Diagnosis Date Noted  . Primary progressive aphasia ([place]) 05/25/2018  . Expressive aphasia 09/15/2017  . Migraine 10/13/2016  . Insomnia 10/13/2016  . Essential hypertension 10/13/2016  . Moderate episode of recurrent major depressive disorder ([place]) 05/12/2016  . Small vessel disease, cerebrovascular 12/22/2015  . Slurred speech 07/24/2015  . Abnormal CT of brain 07/08/2015  . Speech abnormality 06/20/2015  . Thyroid nodule 06/20/2015  . S/P laparoscopic cholecystectomy 12/05/2014  . Heart murmur 02/11/2013  . Type 2 diabetes mellitus, controlled ([place]) 02/09/2013  . Other and unspecified hyperlipidemia 02/09/2013   Deneise Lever, [place], [place] 05/30/2019, 8:57 AM  [facility] [address] [place], [place], [address] Phone: [phone]   Fax:  [phone]   Name: Meghan Hickman MRN: [id number] Date of Birth: July 21, 1951

## 2019-05-30 NOTE — Progress Notes (Signed)
Pt is wanting to talk about the metformin, says since taken off the med, after eating she gets very tired and sleepy. Her glucose meter she was sharing with her mom has been broken for 3 wks. Cbg's have not been chk'd. Reports not other px with other meds. Pharmacy verified, meter pending.

## 2019-05-30 NOTE — Progress Notes (Signed)
Telemedicine Encounter- SOAP NOTE Established Patient  This telephone encounter was conducted with the patient's (or proxy's) verbal consent via audio telecommunications: yes/no: Yes Patient was instructed to have this encounter in a suitably private space; and to only have persons present to whom they give permission to participate. In addition, patient identity was confirmed by use of name plus two identifiers (DOB and address).  I discussed the limitations, risks, security and privacy concerns of performing an evaluation and management service by telephone and the availability of in person appointments. I also discussed with the patient that there may be a patient responsible charge related to this service. The patient expressed understanding and agreed to proceed.  I spent a total of TIME; 0 MIN TO 60 MIN: 25 minutes talking with the patient or their proxy.  No chief complaint on file.   Subjective   Meghan Hickman is a 68 y.o. established patient. Telephone visit today for  HPI Diabetes with hyperglycemia She reports that since being off her metformin which was stopped she has been having fatigue after eating She is now retired and is home bound due to [place] and has been eating a lot more food. She has not been able to check her sugars.  Expressive aphasia She has a device from the speech therapy and she reports that she is having a harder time speaking She gets frustrated at times  Migraines and Insomnia She has about 2-3 migraines a week and needs tramadol for headaches She also needs restoril for sleep   Patient Active Problem List   Diagnosis Date Noted  . Primary progressive aphasia ([place]) 05/25/2018  . Expressive aphasia 09/15/2017  . Migraine 10/13/2016  . Insomnia 10/13/2016  . Essential hypertension 10/13/2016  . Moderate episode of recurrent major depressive disorder ([place]) 05/12/2016  . Small vessel disease, cerebrovascular 12/22/2015  . Slurred speech  07/24/2015  . Abnormal CT of brain 07/08/2015  . Speech abnormality 06/20/2015  . Thyroid nodule 06/20/2015  . S/P laparoscopic cholecystectomy 12/05/2014  . Heart murmur 02/11/2013  . Type 2 diabetes mellitus, controlled ([place]) 02/09/2013  . Other and unspecified hyperlipidemia 02/09/2013    Past Medical History:  Diagnosis Date  . Arthritis   . Depression   . Diabetes mellitus without complication ([place])   . Diverticulitis   . GERD (gastroesophageal reflux disease)   . Headache   . Heart murmur   . Hyperlipidemia   . Hypertension   . Speech problem   . Wears glasses     Current Outpatient Medications  Medication Sig Dispense Refill  . albuterol (PROVENTIL HFA;VENTOLIN HFA) 108 (90 Base) MCG/ACT inhaler Inhale 2 puffs into the lungs every 6 (six) hours as needed for wheezing or shortness of breath. 1 Inhaler 0  . almotriptan (AXERT) 12.5 MG tablet Take 12.5 mg by mouth daily as needed for migraine.   4  . ALPRAZolam (XANAX) 1 MG tablet Take 1-2 tablets thirty minutes prior to MRI.  May repeat with 1 additional tablet prior to entering scanner, if needed.  Must have driver. 3 tablet 0  . aspirin 81 MG tablet Take 81 mg by mouth daily.    [name] atorvastatin (LIPITOR) 20 MG tablet Take one tablet by mouth daily 90 tablet 3  . escitalopram (LEXAPRO) 20 MG tablet Take 1 tablet (20 mg total) by mouth daily. 90 tablet 3  . famotidine (PEPCID) 40 MG tablet Take 1 tablet (40 mg total) by mouth daily. 90 tablet 3  . gabapentin (  NEURONTIN) 300 MG capsule TAKE 1 CAPSULE BY MOUTH THREE TIMES A DAY 270 capsule 0  . ipratropium (ATROVENT) 0.03 % nasal spray Place 2 sprays into both nostrils 2 (two) times daily. 30 mL 0  . losartan-hydrochlorothiazide (HYZAAR) 50-12.5 MG tablet TAKE 1/2 TABLET BY MOUTH EVERY DAY 30 tablet 0  . Multiple Vitamins-Minerals (MULTIVITAMIN & MINERAL PO) Take 1 tablet by mouth daily.    . temazepam (RESTORIL) 30 MG capsule TAKE 1 CAPSULE BY MOUTH AT BEDTIME AS NEEDED FOR  SLEEP 30 capsule 2  . traMADol (ULTRAM) 50 MG tablet Take 1 tablet (50 mg total) by mouth every 6 (six) hours as needed. 30 tablet 0  . blood glucose meter kit and supplies Dispense based on patient and insurance preference. Use up to three times daily as directed. (FOR ICD-10 E11.65). Dispense 100 lancets and test strips and 1 lancing device to match meter. 1 each 0  . donepezil (ARICEPT) 10 MG tablet Take 1 tablet (10 mg total) by mouth at bedtime. 30 tablet 5   No current facility-administered medications for this visit.     No Known Allergies  Social History   Socioeconomic History  . Marital status: Single    Spouse name: Not on file  . Number of children: 0  . Years of education: 24  . Highest education level: Not on file  Occupational History  . Occupation: Scientist, research (medical)  Social Needs  . Financial resource strain: Not on file  . Food insecurity    Worry: Not on file    Inability: Not on file  . Transportation needs    Medical: Not on file    Non-medical: Not on file  Tobacco Use  . Smoking status: Former Smoker    Packs/day: 0.50    Years: 20.00    Pack years: 10.00    Types: Cigarettes    Start date: 04/30/2012    Quit date: 08/16/2012    Years since quitting: 6.7  . Smokeless tobacco: Never Used  Substance and Sexual Activity  . Alcohol use: No    Alcohol/week: 0.0 standard drinks  . Drug use: No  . Sexual activity: Never    Comment: was smoking 1/2 pppd  Lifestyle  . Physical activity    Days per week: Not on file    Minutes per session: Not on file  . Stress: Not on file  Relationships  . Social Herbalist on phone: Not on file    Gets together: Not on file    Attends religious service: Not on file    Active member of club or organization: Not on file    Attends meetings of clubs or organizations: Not on file    Relationship status: Not on file  . Intimate partner violence    Fear of current or ex partner: Not on file    Emotionally abused:  Not on file    Physically abused: Not on file    Forced sexual activity: Not on file  Other Topics Concern  . Not on file  Social History Narrative   Lives at home alone.   Right-handed.   2 cups caffeine per day.    ROS Review of Systems  Constitutional: Negative for activity change, appetite change, chills and fever.  HENT: Negative for congestion, nosebleeds, trouble swallowing and voice change.   Respiratory: Negative for cough, shortness of breath and wheezing.   Gastrointestinal: Negative for diarrhea, nausea and vomiting.  Genitourinary: Negative for difficulty urinating,  dysuria, flank pain and hematuria.  Musculoskeletal: Negative for back pain, joint swelling and neck pain.  Neurological: Negative for dizziness, speech difficulty, light-headedness and numbness.  See HPI. All other review of systems negative.   Objective   Vitals as reported by the patient: There were no vitals filed for this visit.  Diagnoses and all orders for this visit:  Type 2 diabetes mellitus with hyperglycemia, without long-term current use of insulin ([place])- deterioration from baseline due to difficulty with exercise and eating more at home Will check levels  Anticipate that she will need to resume metformin  -     blood glucose meter kit and supplies; Dispense based on patient and insurance preference. Use up to three times daily as directed. (FOR ICD-10 E11.65). Dispense 100 lancets and test strips and 1 lancing device to match meter. -     CMP14+EGFR; Future -     Hemoglobin A1c; Future -     Lipid panel; Future  Rate of speech, slow - continue using device and working with PT  Chronic migraine without aura without status migrainosus, not intractable-  REFILLED MEDS  Other orders -     traMADol (ULTRAM) 50 MG tablet; Take 1 tablet (50 mg total) by mouth every 6 (six) hours as needed. -     famotidine (PEPCID) 40 MG tablet; Take 1 tablet (40 mg total) by mouth daily. -     temazepam  (RESTORIL) 30 MG capsule; TAKE 1 CAPSULE BY MOUTH AT BEDTIME AS NEEDED FOR SLEEP     I discussed the assessment and treatment plan with the patient. The patient was provided an opportunity to ask questions and all were answered. The patient agreed with the plan and demonstrated an understanding of the instructions.   The patient was advised to call back or seek an in-person evaluation if the symptoms worsen or if the condition fails to improve as anticipated.  I provided 25 minutes of non-face-to-face time during this encounter.  Forrest Moron, MD  Primary Care at [facility]

## 2019-06-05 ENCOUNTER — Ambulatory Visit: Payer: Medicare Other | Admitting: Speech Pathology

## 2019-06-08 ENCOUNTER — Other Ambulatory Visit: Payer: Self-pay

## 2019-06-08 ENCOUNTER — Ambulatory Visit (INDEPENDENT_AMBULATORY_CARE_PROVIDER_SITE_OTHER): Payer: Medicare Other | Admitting: Family Medicine

## 2019-06-08 DIAGNOSIS — Z23 Encounter for immunization: Secondary | ICD-10-CM | POA: Diagnosis not present

## 2019-06-08 DIAGNOSIS — E1165 Type 2 diabetes mellitus with hyperglycemia: Secondary | ICD-10-CM

## 2019-06-09 LAB — CMP14+EGFR
ALT: 31 IU/L (ref 0–32)
AST: 29 IU/L (ref 0–40)
Albumin/Globulin Ratio: 1.4 (ref 1.2–2.2)
Albumin: 4.1 g/dL (ref 3.8–4.8)
Alkaline Phosphatase: 117 IU/L (ref 39–117)
BUN/Creatinine Ratio: 11 — ABNORMAL LOW (ref 12–28)
BUN: 11 mg/dL (ref 8–27)
Bilirubin Total: 0.7 mg/dL (ref 0.0–1.2)
CO2: 23 mmol/L (ref 20–29)
Calcium: 9.4 mg/dL (ref 8.7–10.3)
Chloride: 105 mmol/L (ref 96–106)
Creatinine, Ser: 1.01 mg/dL — ABNORMAL HIGH (ref 0.57–1.00)
GFR calc Af Amer: 66 mL/min/{1.73_m2} (ref >59)
GFR calc non Af Amer: 57 mL/min/{1.73_m2} — ABNORMAL LOW (ref >59)
Globulin, Total: 3 g/dL (ref 1.5–4.5)
Glucose: 124 mg/dL — ABNORMAL HIGH (ref 65–99)
Potassium: 4.1 mmol/L (ref 3.5–5.2)
Sodium: 140 mmol/L (ref 134–144)
Total Protein: 7.1 g/dL (ref 6.0–8.5)

## 2019-06-09 LAB — LIPID PANEL
Chol/HDL Ratio: 3.2 ratio (ref 0.0–4.4)
Cholesterol, Total: 162 mg/dL (ref 100–199)
HDL: 50 mg/dL (ref >39)
LDL Chol Calc (NIH): 94 mg/dL (ref 0–99)
Triglycerides: 101 mg/dL (ref 0–149)
VLDL Cholesterol Cal: 18 mg/dL (ref 5–40)

## 2019-06-09 LAB — HEMOGLOBIN A1C
Est. average glucose Bld gHb Est-mCnc: 137 mg/dL
Hgb A1c MFr Bld: 6.4 % — ABNORMAL HIGH (ref 4.8–5.6)

## 2019-06-15 ENCOUNTER — Ambulatory Visit: Payer: Self-pay | Admitting: Family Medicine

## 2019-06-19 ENCOUNTER — Ambulatory Visit: Payer: Medicare Other | Attending: Adult Health | Admitting: Speech Pathology

## 2019-06-19 ENCOUNTER — Other Ambulatory Visit: Payer: Self-pay

## 2019-06-19 DIAGNOSIS — G3101 Pick's disease: Secondary | ICD-10-CM | POA: Insufficient documentation

## 2019-06-19 DIAGNOSIS — R482 Apraxia: Secondary | ICD-10-CM

## 2019-06-19 DIAGNOSIS — F0280 Dementia in other diseases classified elsewhere without behavioral disturbance: Secondary | ICD-10-CM | POA: Diagnosis present

## 2019-06-20 ENCOUNTER — Telehealth: Payer: Self-pay | Admitting: Family Medicine

## 2019-06-20 NOTE — Therapy (Signed)
[facility] [address] [place], [place], [address] Phone: [phone]   Fax:  [phone]  Speech Language Pathology Treatment  Patient Details  Name: Meghan Hickman MRN: [id number] Date of Birth: March 23, 1951 Referring Provider (SLP): Ward Givens, NP   Encounter Date: 06/19/2019  End of Session - 06/19/19 1533   Visit Number  19    Number of Visits  19    Date for SLP Re-Evaluation  06/19/19   date extended as pt missed final appointment   Authorization Type  BCBS, Medicare    SLP Start Time  1532    SLP Stop Time   1615    SLP Time Calculation (min)  43 min    Activity Tolerance  Patient tolerated treatment well       Past Medical History:  Diagnosis Date  . Arthritis   . Depression   . Diabetes mellitus without complication ([place])   . Diverticulitis   . GERD (gastroesophageal reflux disease)   . Headache   . Heart murmur   . Hyperlipidemia   . Hypertension   . Speech problem   . Wears glasses     Past Surgical History:  Procedure Laterality Date  . CHOLECYSTECTOMY N/A 12/05/2014   Procedure: LAPAROSCOPIC CHOLECYSTECTOMY;  Surgeon: Rolm Bookbinder, MD;  Location: [facility];  Service: General;  Laterality: N/A;  . SHOULDER ARTHROSCOPY WITH SUBACROMIAL DECOMPRESSION AND BICEP TENDON REPAIR Right 04/23/2013   Procedure: SHOULDER ARTHROSCOPY WITH SUBACROMIAL DECOMPRESSION AND BICEP TENDON TENOTOMY;  Surgeon: Nita Sells, MD;  Location: [facility];  Service: Orthopedics;  Laterality: Right;    There were no vitals filed for this visit.  Subjective Assessment - 06/19/19 1533    Subjective  "I have been typing words I can't say and practicing them. Sometimes it works" (pt, via SGD)    Currently in Pain?  No/denies            ADULT SLP TREATMENT - 06/19/19 1533     General Information   Behavior/Cognition  Alert;Cooperative;Pleasant mood      Treatment Provided   Treatment provided  Cognitive-Linquistic      Pain Assessment   Pain Assessment  No/denies pain    Pain Score  0-No pain      Cognitive-Linquistic Treatment   Treatment focused on  Apraxia;Aphasia;Patient/family/caregiver education    Skilled Treatment  Patient used SGD to ask SLP 10 questions/make comments about how she has been using her device since last session. Pt told SLP she has been frustrated with her ability to say "Thanks", so SLP assisted pt with recording a video model for home practice. Pt able to imitate correctly with visual/auditory feedback. Reports using device for MD appointment as well as with her cousin to discuss the election. Pt agreed with SLP that she is more confident using the device. She has made changes by moving icons to different locations for easier access. SLP praised pt for her continued efforts with the device and for using it to help her communicate more effectively. Pt's questions answered to her satisfaction and she is in agreement with d/c at this time.      Assessment / Recommendations / Plan   Plan  Discharge SLP treatment due to (comment)   goals met     Progression Toward Goals   Progression toward goals  Goals met, education completed, patient discharged from [facility] - 06/19/19 1533   Education Details  aphasia group, contact for [place] for hardware/software issues, consider another course of ST if communication function declines    Person(s) Educated  Patient    Methods  Explanation    Comprehension  Verbalized understanding       SLP Short Term Goals -06/19/19 1533     SLP SHORT TERM GOAL #1   Title  Pt will retrieve 9/10 personally relevant words/images on AAC system in response to open ended questions with rare min A x 3 sessions    Status  Achieved      SLP SHORT TERM GOAL #2   Title  pt will use scripting or SGD to successfully convey her food order in role-play scenario x3 sessions.    Baseline  02/20/19,  02-27-19    Status  Partially Met   believe pt would have met goal had this been done on 03-01-19     SLP SHORT TERM GOAL #3   Title  Pt will use compensations for anomia/apraxia in 5 minutes simple conversation 80% of the time over 3 sessions.    Status  Partially Met       SLP Long Term Goals - 06/19/19 1533      SLP LONG TERM GOAL #1   Title  Pt will relay personal or medical information in functional scenarios (using scripting or SGD if necessary) x3 sessions.    Baseline  05/01/19 05/03/19    Time  1    Period  Weeks    Status  Partially Met      SLP LONG TERM GOAL #2   Title  pt will report success when ordering food on the phone or at the drive through using script or SGD x 3 sessions    Baseline  02-27-19, 05/03/19    Time  1    Period  Weeks    Status  Partially Met      SLP LONG TERM GOAL #3   Title  pt will demo 10 minutes functional simple to mod complex conversation with modified independence (using compensations and/or SGD) over three sessions    Baseline  05/01/19, 05/03/19, 05/08/19    Time  1    Period  Weeks    Status  Achieved      SLP LONG TERM GOAL #4   Title  Patient will report using SGD in conversation with friends or family in person or over the phone, x3 sessions.    Baseline  05/03/19, 05/15/19, 05/30/19    Time  1    Period  Weeks    Status  Achieved      SLP LONG TERM GOAL #5   Title  Pt will demo use of workspace to move icons and build phrases with mod I.    Time  1    Period  Weeks    Status  Achieved       Plan - 06/19/19 1533   Clinical Impression Statement  Meghan Hickman presents with moderate overall difficulty with expressive speech today; improves when pt slows rate. Patient demonstrates confidence in using her device to communicate outside of ST in person, over the phone, and at [employer]. She had prepared 10 questions to ask SLP for today's session using her device. Also showed SLP icons added to talk with her cousin about  the election. Able to move and organize icons for easy access. LTGs met and pt is in agreement with d/c at this time. SLP feels additional course of ST may be beneficial  in 6 months pending pt needs given diagnosis of progressive neurologic disease.    Speech Therapy Frequency  --   d/c   Duration  --   d/c   Treatment/Interventions  Cueing hierarchy;Functional tasks;Patient/family education;Environmental controls;Cognitive reorganization;Multimodal communcation approach;Language facilitation;Compensatory techniques;Internal/external aids;SLP instruction and feedback    Potential to Achieve Goals  Good    Consulted and Agree with Plan of Care  Patient       Patient will benefit from skilled therapeutic intervention in order to improve the following deficits and impairments:   Primary progressive aphasia [facility])  Verbal apraxia    Problem List Patient Active Problem List   Diagnosis Date Noted  . Primary progressive aphasia ([place]) 05/25/2018  . Expressive aphasia 09/15/2017  . Migraine 10/13/2016  . Insomnia 10/13/2016  . Essential hypertension 10/13/2016  . Moderate episode of recurrent major depressive disorder ([place]) 05/12/2016  . Small vessel disease, cerebrovascular 12/22/2015  . Slurred speech 07/24/2015  . Abnormal CT of brain 07/08/2015  . Speech abnormality 06/20/2015  . Thyroid nodule 06/20/2015  . S/P laparoscopic cholecystectomy 12/05/2014  . Heart murmur 02/11/2013  . Type 2 diabetes mellitus, controlled ([place]) 02/09/2013  . Other and unspecified hyperlipidemia 02/09/2013   SPEECH THERAPY DISCHARGE SUMMARY  Visits from Start of Care: 19  Current functional level related to goals / functional outcomes: Patient reports confidence using her speech generating device to communicate with medical providers, friends, family, and in the community. She demonstrates ability to add and arrange device content to augment her communication needs.    Remaining deficits: Moderate  speech difficulty due to primary progressive aphasia, apraxia which is expected to decline over time.   Education / Equipment: Aphasia community resources, [facility] for device support, consider additional course of ST as communication needs change  Plan: Patient agrees to discharge.  Patient goals were met. Patient is being discharged due to meeting the stated rehab goals.  ?????         Deneise Lever, [place], CCC-SLP Speech-Language Pathologist  Aliene Altes 06/20/2019, 12:24 PM  [facility] [address] [place], [place], [address] Phone: [phone]   Fax:  [phone]   Name: Meghan Hickman MRN: [id number] Date of Birth: April 19, 1951

## 2019-06-20 NOTE — Telephone Encounter (Signed)
Pt would like results of labs. Please call back. °

## 2019-06-22 NOTE — Telephone Encounter (Signed)
Spoke with pt and informed her of labs, she verbalized understanding.

## 2019-06-23 ENCOUNTER — Other Ambulatory Visit: Payer: Self-pay | Admitting: Family Medicine

## 2019-06-24 NOTE — Telephone Encounter (Signed)
Requested Prescriptions  Pending Prescriptions Disp Refills  . ONETOUCH ULTRA test strip [employer] Med Name: ONE TOUCH ULTRA BLUE TEST STRP] 100 strip 2    Sig: USE UP TO 3 TIMES A DAY AS DIRECTED     Endocrinology: Diabetes - Testing Supplies Passed - 06/23/2019  9:30 AM      Passed - Valid encounter within last 12 months    Recent Outpatient Visits          2 weeks ago Need for prophylactic vaccination and inoculation against influenza   [facility] at [facility], Zoe A, MD   3 weeks ago Type 2 diabetes mellitus with hyperglycemia, without long-term current use of insulin ([place])   Primary Care at [facility], Zoe A, MD   6 months ago RLS (restless legs syndrome)   Primary Care at [facility], MD   9 months ago Controlled type 2 diabetes mellitus with diabetic nephropathy, without long-term current use of insulin ([place])   Primary Care at [facility], Zoe A, MD   1 year ago Controlled type 2 diabetes mellitus with diabetic nephropathy, without long-term current use of insulin ([place])   Primary Care at [facility], Arlie Solomons, MD

## 2019-07-02 ENCOUNTER — Other Ambulatory Visit: Payer: Self-pay | Admitting: Family Medicine

## 2019-07-02 DIAGNOSIS — I10 Essential (primary) hypertension: Secondary | ICD-10-CM

## 2019-07-08 ENCOUNTER — Encounter: Payer: Self-pay | Admitting: Adult Health

## 2019-07-18 ENCOUNTER — Encounter: Payer: Self-pay | Admitting: Adult Health

## 2019-07-18 ENCOUNTER — Other Ambulatory Visit: Payer: Self-pay

## 2019-07-18 ENCOUNTER — Ambulatory Visit (INDEPENDENT_AMBULATORY_CARE_PROVIDER_SITE_OTHER): Payer: Medicare Other | Admitting: Adult Health

## 2019-07-18 VITALS — BP 110/80 | HR 106 | Temp 96.8°F | Ht 60.0 in | Wt 161.6 lb

## 2019-07-18 DIAGNOSIS — Z9989 Dependence on other enabling machines and devices: Secondary | ICD-10-CM | POA: Diagnosis not present

## 2019-07-18 DIAGNOSIS — F0280 Dementia in other diseases classified elsewhere without behavioral disturbance: Secondary | ICD-10-CM

## 2019-07-18 DIAGNOSIS — G3101 Pick's disease: Secondary | ICD-10-CM

## 2019-07-18 DIAGNOSIS — G4733 Obstructive sleep apnea (adult) (pediatric): Secondary | ICD-10-CM | POA: Diagnosis not present

## 2019-07-18 DIAGNOSIS — R413 Other amnesia: Secondary | ICD-10-CM | POA: Diagnosis not present

## 2019-07-18 DIAGNOSIS — R519 Headache, unspecified: Secondary | ICD-10-CM | POA: Diagnosis not present

## 2019-07-18 NOTE — Patient Instructions (Addendum)
Your Plan:  Continue Aricept  We will continue to monitor memory Continue using CPAP nightly and greater than 4 hours each night If your symptoms worsen or you develop new symptoms please let us know.   Thank you for coming to see [place] at [facility]. I hope we have been able to provide you high quality care today.  You may receive a patient satisfaction survey over the next few weeks. We would appreciate your feedback and comments so that we may continue to improve ourselves and the health of our patients.

## 2019-07-18 NOTE — Progress Notes (Signed)
PATIENT: Meghan Hickman DOB: [birth year]  REASON FOR VISIT: follow up HISTORY FROM: patient  HISTORY OF PRESENT ILLNESS: Today 07/18/19:  Meghan Hickman is a 68 year old female with a history of obstructive sleep apnea on CPAP in primary progressive aphasia.  At the last visit she was spent sent to speech therapy.  She reports that it was beneficial. She now has a devise that she uses. Feels that her memory may be worse. She lives at home alone. Able to complete all ADLs independently. 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 She continue on Aricept 10 mg at bedtime.  She reports that she has a headache every day.  She typically takes over-the-counter medication on a daily basis.  She states that she often wakes up with a headache and it continues throughout the day.  Her CPAP download indicates that she use her machine 25 out of 30 days for compliance of 83%.  She used her machine greater than 4 hours 11 days for compliance of 37%.  She used her machine on average 4 hours and 10 minutes each night.  Her residual AHI is 2.1 on 9 cm of water with EPR of 3.  Her leak in the 95th percentile is 8.6 L/min.  HISTORY Meghan Hickman is a 68 y.o. female who has been followed in this office for primary progressive aphasia and obstructive sleep apnea on CPAP was initially scheduled for face-to-face office follow up visit today time but due to [place] rescheduled for non-face-to-face telephone visit with patients consent. Unable to participate in video visit due to lack of access to device with camera.   Meghan Hickman is a 68 year old female with a history of obstructive sleep apnea on CPAP and primary progressive aphasia.  The patient states that she has not been using the CPAP regularly.  She states that the mask sometimes presses on her sinuses making it uncomfortable.  Se also states that there are times that she falls asleep on the couch without putting the CPAP on.  She states that  her speech continues to get worse.  She was at home alone.  She is able to complete all ADLs independently.  She operates a Teacher, music without difficulty.  She denies any new issues.  She does state that in the past she went to speech therapy and would like to do this again.   REVIEW OF SYSTEMS: Out of a complete 14 system review of symptoms, the patient complains only of the following symptoms, and all other reviewed systems -year-old ALLERGIES: No Known Allergies  HOME MEDICATIONS: Outpatient Medications Prior to Visit  Medication Sig Dispense Refill  . albuterol (PROVENTIL HFA;VENTOLIN HFA) 108 (90 Base) MCG/ACT inhaler Inhale 2 puffs into the lungs every 6 (six) hours as needed for wheezing or shortness of breath. 1 Inhaler 0  . almotriptan (AXERT) 12.5 MG tablet Take 12.5 mg by mouth daily as needed for migraine.   4  . ALPRAZolam (XANAX) 1 MG tablet Take 1-2 tablets thirty minutes prior to MRI.  May repeat with 1 additional tablet prior to entering scanner, if needed.  Must have driver. 3 tablet 0  . aspirin 81 MG tablet Take 81 mg by mouth daily.    [name] Kitchen atorvastatin (LIPITOR) 20 MG tablet Take one tablet by mouth daily 90 tablet 3  . blood glucose meter kit and supplies Dispense based on patient and insurance preference. Use up to three times daily as directed. (FOR ICD-10 E11.65). Dispense 100 lancets and  test strips and 1 lancing device to match meter. 1 each 0  . donepezil (ARICEPT) 10 MG tablet Take 1 tablet (10 mg total) by mouth at bedtime. 30 tablet 5  . escitalopram (LEXAPRO) 20 MG tablet Take 1 tablet (20 mg total) by mouth daily. 90 tablet 3  . famotidine (PEPCID) 40 MG tablet Take 1 tablet (40 mg total) by mouth daily. 90 tablet 3  . gabapentin (NEURONTIN) 300 MG capsule TAKE 1 CAPSULE BY MOUTH THREE TIMES A DAY 270 capsule 0  . ipratropium (ATROVENT) 0.03 % nasal spray Place 2 sprays into both nostrils 2 (two) times daily. 30 mL 0  . losartan-hydrochlorothiazide (HYZAAR)  50-12.5 MG tablet TAKE 1/2 TABLET BY MOUTH EVERY DAY 30 tablet 0  . Multiple Vitamins-Minerals (MULTIVITAMIN & MINERAL PO) Take 1 tablet by mouth daily.    Glory Rosebush ULTRA test strip USE UP TO 3 TIMES A DAY AS DIRECTED 100 strip 2  . temazepam (RESTORIL) 30 MG capsule TAKE 1 CAPSULE BY MOUTH AT BEDTIME AS NEEDED FOR SLEEP 30 capsule 2  . traMADol (ULTRAM) 50 MG tablet Take 1 tablet (50 mg total) by mouth every 6 (six) hours as needed. 30 tablet 0   No facility-administered medications prior to visit.     PAST MEDICAL HISTORY: Past Medical History:  Diagnosis Date  . Arthritis   . Depression   . Diabetes mellitus without complication ([place])   . Diverticulitis   . GERD (gastroesophageal reflux disease)   . Headache   . Heart murmur   . Hyperlipidemia   . Hypertension   . Speech problem   . Wears glasses     PAST SURGICAL HISTORY: Past Surgical History:  Procedure Laterality Date  . CHOLECYSTECTOMY N/A 12/05/2014   Procedure: LAPAROSCOPIC CHOLECYSTECTOMY;  Surgeon: Rolm Bookbinder, MD;  Location: [facility];  Service: General;  Laterality: N/A;  . SHOULDER ARTHROSCOPY WITH SUBACROMIAL DECOMPRESSION AND BICEP TENDON REPAIR Right 04/23/2013   Procedure: SHOULDER ARTHROSCOPY WITH SUBACROMIAL DECOMPRESSION AND BICEP TENDON TENOTOMY;  Surgeon: Nita Sells, MD;  Location: [facility];  Service: Orthopedics;  Laterality: Right;    FAMILY HISTORY: Family History  Problem Relation Age of Onset  . Stroke Mother   . Hypertension Mother   . Diabetes Mother   . Breast cancer Mother   . Healthy Father   . Heart disease Brother     SOCIAL HISTORY: Social History   Socioeconomic History  . Marital status: Single    Spouse name: Not on file  . Number of children: 0  . Years of education: 71  . Highest education level: Not on file  Occupational History  . Occupation: Scientist, research (medical)  Social Needs  . Financial resource strain: Not on file  . Food insecurity     Worry: Not on file    Inability: Not on file  . Transportation needs    Medical: Not on file    Non-medical: Not on file  Tobacco Use  . Smoking status: Former Smoker    Packs/day: 0.50    Years: 20.00    Pack years: 10.00    Types: Cigarettes    Start date: 04/30/2012    Quit date: 08/16/2012    Years since quitting: 6.9  . Smokeless tobacco: Never Used  Substance and Sexual Activity  . Alcohol use: No    Alcohol/week: 0.0 standard drinks  . Drug use: No  . Sexual activity: Never    Comment: was smoking 1/2 pppd  Lifestyle  .  Physical activity    Days per week: Not on file    Minutes per session: Not on file  . Stress: Not on file  Relationships  . Social Herbalist on phone: Not on file    Gets together: Not on file    Attends religious service: Not on file    Active member of club or organization: Not on file    Attends meetings of clubs or organizations: Not on file    Relationship status: Not on file  . Intimate partner violence    Fear of current or ex partner: Not on file    Emotionally abused: Not on file    Physically abused: Not on file    Forced sexual activity: Not on file  Other Topics Concern  . Not on file  Social History Narrative   Lives at home alone.   Right-handed.   2 cups caffeine per day.      PHYSICAL EXAM  Vitals:   07/18/19 1439  BP: 110/80  Pulse: (!) 106  Temp: (!) 96.8 F (36 C)  Weight: 161 lb 9.6 oz (73.3 kg)  Height: 5' (1.524 m)   Body mass index is 31.56 kg/m.   MMSE - Mini Mental State Exam 07/18/2019  Orientation to time 5  Orientation to Place 5  Registration 3  Attention/ Calculation 5  Recall 3  Language- name 2 objects 2  Language- repeat 0  Language- follow 3 step command 3  Language- read & follow direction 1  Write a sentence 1  Write a sentence-comments couple misss spellings  Copy design 1  Copy design-comments named 9 animals  Total score 29      Generalized: Well developed, in no  acute distress   Neurological examination  Mentation: Alert oriented to time, place, history taking. Follows all commands, expressive aphasia Cranial nerve II-XII: Pupils were equal round reactive to light. Extraocular movements were full, visual field were full on confrontational test.. Head turning and shoulder shrug  were normal and symmetric. Motor: The motor testing reveals 5 over 5 strength of all 4 extremities. Good symmetric motor tone is noted throughout.  Sensory: Sensory testing is intact to soft touch on all 4 extremities. No evidence of extinction is noted.  Coordination: Cerebellar testing reveals good finger-nose-finger and heel-to-shin bilaterally.  Gait and station: Gait is normal. Reflexes: Deep tendon reflexes are symmetric and normal bilaterally.   DIAGNOSTIC DATA (LABS, IMAGING, TESTING) - I reviewed patient records, labs, notes, testing and imaging myself where available.  Lab Results  Component Value Date   WBC 7.3 12/04/2017   HGB 12.9 12/04/2017   HCT 40.9 12/04/2017   MCV 85.7 12/04/2017   PLT 370 12/04/2017      Component Value Date/Time   NA 140 06/08/2019 1026   K 4.1 06/08/2019 1026   CL 105 06/08/2019 1026   CO2 23 06/08/2019 1026   GLUCOSE 124 (H) 06/08/2019 1026   GLUCOSE 100 (H) 11/30/2015 1150   BUN 11 06/08/2019 1026   CREATININE 1.01 (H) 06/08/2019 1026   CREATININE 0.73 10/30/2015 1232   CALCIUM 9.4 06/08/2019 1026   PROT 7.1 06/08/2019 1026   ALBUMIN 4.1 06/08/2019 1026   AST 29 06/08/2019 1026   ALT 31 06/08/2019 1026   ALKPHOS 117 06/08/2019 1026   BILITOT 0.7 06/08/2019 1026   GFRNONAA 57 (L) 06/08/2019 1026   GFRNONAA 87 10/30/2015 1232   GFRAA 66 06/08/2019 1026   GFRAA >89 10/30/2015 1232  Lab Results  Component Value Date   CHOL 162 06/08/2019   HDL 50 06/08/2019   LDLCALC 94 06/08/2019   TRIG 101 06/08/2019   CHOLHDL 3.2 06/08/2019   Lab Results  Component Value Date   HGBA1C 6.4 (H) 06/08/2019   No results found  for: VITAMINB12 Lab Results  Component Value Date   TSH 2.360 10/13/2016      ASSESSMENT AND PLAN 68 y.o. year old female  has a past medical history of Arthritis, Depression, Diabetes mellitus without complication ([place]), Diverticulitis, GERD (gastroesophageal reflux disease), Headache, Heart murmur, Hyperlipidemia, Hypertension, Speech problem, and Wears glasses. here with :  1.  Primary progressive aphasia 2.  Memory disturbance 3.  Obstructive sleep apnea on CPAP 4.  Headache  Overall the patient has remained stable.  She will continue on Aricept.  Her memory score was MMSE 29/30.  We will continue to monitor symptoms.  CPAP download shows suboptimal compliance but good treatment of her apnea.  She is encouraged to try to use machine greater than 4 hours each night.  Patient's headaches may be a result of sleep apnea.  Advised the patient to become more compliant with CPAP and see if her headaches improve.  She was also advised to avoid taking over-the-counter medication on a daily basis as this can cause rebound headaches.  Patient is advised that if her symptoms worsen or she develops new symptoms she should let us know.  She will follow-up in 6 months or sooner if needed.     Ward Givens, MSN, NP-C 07/18/2019, 2:25 PM [facility] [address] [place], [place] [address] [phone]

## 2019-08-06 ENCOUNTER — Other Ambulatory Visit: Payer: Self-pay | Admitting: Neurology

## 2019-08-30 ENCOUNTER — Other Ambulatory Visit: Payer: Self-pay | Admitting: Family Medicine

## 2019-08-30 DIAGNOSIS — I10 Essential (primary) hypertension: Secondary | ICD-10-CM

## 2019-09-12 ENCOUNTER — Other Ambulatory Visit: Payer: Self-pay | Admitting: Family Medicine

## 2019-09-12 DIAGNOSIS — I10 Essential (primary) hypertension: Secondary | ICD-10-CM

## 2019-09-18 ENCOUNTER — Other Ambulatory Visit: Payer: Self-pay | Admitting: Family Medicine

## 2019-09-18 DIAGNOSIS — F331 Major depressive disorder, recurrent, moderate: Secondary | ICD-10-CM

## 2019-09-29 ENCOUNTER — Ambulatory Visit: Payer: Medicare Other | Attending: Internal Medicine

## 2019-09-29 DIAGNOSIS — Z23 Encounter for immunization: Secondary | ICD-10-CM | POA: Insufficient documentation

## 2019-09-29 NOTE — Progress Notes (Signed)
   Covid-19 Vaccination Clinic  Name:  KELLEE SITTNER    MRN: [id number] DOB: [birth year]  09/29/2019  Ms. Leske was observed post Covid-19 immunization for 15 minutes without incidence. She was provided with Vaccine Information Sheet and instruction to access the V-Safe system.   Ms. Boutwell was instructed to call 911 with any severe reactions post vaccine: [name] Kitchen Difficulty breathing  . Swelling of your face and throat  . A fast heartbeat  . A bad rash all over your body  . Dizziness and weakness    Immunizations Administered    Name Date Dose VIS Date Route   Pfizer COVID-19 Vaccine 09/29/2019 10:43 AM 0.3 mL 07/20/2019 Intramuscular   Manufacturer: [employer], [employer]   Lot: [id number]   NDC: [address]-6

## 2019-10-23 ENCOUNTER — Ambulatory Visit: Payer: Medicare Other | Attending: Internal Medicine

## 2019-10-23 DIAGNOSIS — Z23 Encounter for immunization: Secondary | ICD-10-CM

## 2019-10-23 NOTE — Progress Notes (Signed)
   Covid-19 Vaccination Clinic  Name:  Meghan Hickman    MRN: [id number] DOB: Mar 22, 1951  10/23/2019  [name] was observed post Covid-19 immunization for 15 minutes without incident. She was provided with Vaccine Information Sheet and instruction to access the V-Safe system.   [name] was instructed to call 911 with any severe reactions post vaccine: [name] Kitchen Difficulty breathing  . Swelling of face and throat  . A fast heartbeat  . A bad rash all over body  . Dizziness and weakness   Immunizations Administered    Name Date Dose VIS Date Route   Pfizer COVID-19 Vaccine 10/23/2019  3:23 PM 0.3 mL 07/20/2019 Intramuscular   Manufacturer: [employer], [employer]   Lot: [id number]   NDC: [address]-5

## 2019-10-26 ENCOUNTER — Other Ambulatory Visit: Payer: Self-pay | Admitting: Family Medicine

## 2019-10-26 NOTE — Telephone Encounter (Signed)
Patient is requesting a refill of the following medications: Requested Prescriptions   Pending Prescriptions Disp Refills  . traMADol (ULTRAM) 50 MG tablet [Pharmacy Med Name: TRAMADOL HCL 50 MG TABLET] 30 tablet 0    Sig: TAKE 1 TABLET BY MOUTH EVERY 6 HOURS AS NEEDED    Date of patient request: 10/26/2019 Last office visit: 06/08/2019 Date of last refill: 05/30/2019 Last refill amount: 30 tablets Follow up time period per chart: no follow up scheduled

## 2019-12-14 ENCOUNTER — Other Ambulatory Visit: Payer: Self-pay | Admitting: Family Medicine

## 2019-12-14 DIAGNOSIS — I10 Essential (primary) hypertension: Secondary | ICD-10-CM

## 2019-12-14 NOTE — Telephone Encounter (Signed)
Requested  medications are  due for refill today yes  Requested medications are on the active medication list yes  Last refill 2/3  Future visit scheduled no  Notes to clinic Failed protocol due to no visit within 6 months

## 2020-01-28 ENCOUNTER — Ambulatory Visit (INDEPENDENT_AMBULATORY_CARE_PROVIDER_SITE_OTHER): Payer: Medicare Other | Admitting: Adult Health

## 2020-01-28 ENCOUNTER — Other Ambulatory Visit: Payer: Self-pay

## 2020-01-28 ENCOUNTER — Encounter: Payer: Self-pay | Admitting: Adult Health

## 2020-01-28 VITALS — BP 137/77 | HR 97 | Ht 60.0 in | Wt 163.0 lb

## 2020-01-28 DIAGNOSIS — G3101 Pick's disease: Secondary | ICD-10-CM | POA: Diagnosis not present

## 2020-01-28 DIAGNOSIS — F0280 Dementia in other diseases classified elsewhere without behavioral disturbance: Secondary | ICD-10-CM

## 2020-01-28 DIAGNOSIS — Z9989 Dependence on other enabling machines and devices: Secondary | ICD-10-CM

## 2020-01-28 DIAGNOSIS — G4733 Obstructive sleep apnea (adult) (pediatric): Secondary | ICD-10-CM | POA: Diagnosis not present

## 2020-01-28 NOTE — Patient Instructions (Signed)
Your Plan:  Continue to monitor symptoms  Will send order discontinuing CPAP therapy     Thank you for coming to see [place] at [facility]. I hope we have been able to provide you high quality care today.  You may receive a patient satisfaction survey over the next few weeks. We would appreciate your feedback and comments so that we may continue to improve ourselves and the health of our patients.

## 2020-01-28 NOTE — Progress Notes (Signed)
PATIENT: Meghan Hickman DOB: [birth year]  REASON FOR VISIT: follow up HISTORY FROM: patient  HISTORY OF PRESENT ILLNESS: Today 01/28/20:  [name] is a 69 year old female with a history of obstructive sleep apnea on CPAP and primary progressive aphasia.  She states that she has not been using CPAP.  She reports that it makes her head hurt and she is unable to use it due to congestion.  She does not wish to try different mask.  Reports that she does not plan to start CPAP therapy back.  Patient continues to use tablet to help her with her speech.  She denies any new symptoms.  Denies any changes with her memory.  She lives at home alone.  Able to complete all ADLs independently.  She returns today for an evaluation.   HISTORY12/09/20:  [name] is a 69 year old female with a history of obstructive sleep apnea on CPAP in primary progressive aphasia.  At the last visit she was spent sent to speech therapy.  She reports that it was beneficial. She now has a devise that she uses. Feels that her memory may be worse. She lives at home alone. Able to complete all ADLs independently. 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 She continue on Aricept 10 mg at bedtime.  She reports that she has a headache every day.  She typically takes over-the-counter medication on a daily basis.  She states that she often wakes up with a headache and it continues throughout the day.  Her CPAP download indicates that she use her machine 25 out of 30 days for compliance of 83%.  She used her machine greater than 4 hours 11 days for compliance of 37%.  She used her machine on average 4 hours and 10 minutes each night.  Her residual AHI is 2.1 on 9 cm of water with EPR of 3.  Her leak in the 95th percentile is 8.6 L/min.   REVIEW OF SYSTEMS: Out of a complete 14 system review of symptoms, the patient complains only of the following symptoms, and all other reviewed systems are negative.  See  HPI  ALLERGIES: No Known Allergies  HOME MEDICATIONS: Outpatient Medications Prior to Visit  Medication Sig Dispense Refill   albuterol (PROVENTIL HFA;VENTOLIN HFA) 108 (90 Base) MCG/ACT inhaler Inhale 2 puffs into the lungs every 6 (six) hours as needed for wheezing or shortness of breath. 1 Inhaler 0   almotriptan (AXERT) 12.5 MG tablet Take 12.5 mg by mouth daily as needed for migraine.   4   ALPRAZolam (XANAX) 1 MG tablet Take 1-2 tablets thirty minutes prior to MRI.  May repeat with 1 additional tablet prior to entering scanner, if needed.  Must have driver. 3 tablet 0   aspirin 81 MG tablet Take 81 mg by mouth daily.     atorvastatin (LIPITOR) 20 MG tablet Take one tablet by mouth daily 90 tablet 3   blood glucose meter kit and supplies Dispense based on patient and insurance preference. Use up to three times daily as directed. (FOR ICD-10 E11.65). Dispense 100 lancets and test strips and 1 lancing device to match meter. 1 each 0   donepezil (ARICEPT) 10 MG tablet TAKE 1 TABLET BY MOUTH EVERYDAY AT BEDTIME 90 tablet 1   escitalopram (LEXAPRO) 20 MG tablet TAKE 1 TABLET BY MOUTH EVERY DAY 90 tablet 1   famotidine (PEPCID) 40 MG tablet Take 1 tablet (40 mg total) by mouth daily. 90 tablet 3   gabapentin (  NEURONTIN) 300 MG capsule TAKE 1 CAPSULE BY MOUTH THREE TIMES A DAY 270 capsule 0   ipratropium (ATROVENT) 0.03 % nasal spray Place 2 sprays into both nostrils 2 (two) times daily. 30 mL 0   losartan-hydrochlorothiazide (HYZAAR) 50-12.5 MG tablet TAKE 1/2 TABLET BY MOUTH EVERY DAY 45 tablet 0   Multiple Vitamins-Minerals (MULTIVITAMIN & MINERAL PO) Take 1 tablet by mouth daily.     ONETOUCH ULTRA test strip USE UP TO 3 TIMES A DAY AS DIRECTED 100 strip 2   temazepam (RESTORIL) 30 MG capsule TAKE 1 CAPSULE BY MOUTH AT BEDTIME AS NEEDED FOR SLEEP 30 capsule 2   traMADol (ULTRAM) 50 MG tablet TAKE 1 TABLET BY MOUTH EVERY 6 HOURS AS NEEDED 30 tablet 0   No  facility-administered medications prior to visit.    PAST MEDICAL HISTORY: Past Medical History:  Diagnosis Date   Arthritis    Depression    Diabetes mellitus without complication ([place])    Diverticulitis    GERD (gastroesophageal reflux disease)    Headache    Heart murmur    Hyperlipidemia    Hypertension    Speech problem    Wears glasses     PAST SURGICAL HISTORY: Past Surgical History:  Procedure Laterality Date   CHOLECYSTECTOMY N/A 12/05/2014   Procedure: LAPAROSCOPIC CHOLECYSTECTOMY;  Surgeon: Rolm Bookbinder, MD;  Location: [facility];  Service: General;  Laterality: N/A;   SHOULDER ARTHROSCOPY WITH SUBACROMIAL DECOMPRESSION AND BICEP TENDON REPAIR Right 04/23/2013   Procedure: SHOULDER ARTHROSCOPY WITH SUBACROMIAL DECOMPRESSION AND BICEP TENDON TENOTOMY;  Surgeon: Nita Sells, MD;  Location: [facility];  Service: Orthopedics;  Laterality: Right;    FAMILY HISTORY: Family History  Problem Relation Age of Onset   Stroke Mother    Hypertension Mother    Diabetes Mother    Breast cancer Mother    Healthy Father    Heart disease Brother     SOCIAL HISTORY: Social History   Socioeconomic History   Marital status: Single    Spouse name: Not on file   Number of children: 0   Years of education: 16   Highest education level: Not on file  Occupational History   Occupation: Scientist, research (medical)  Tobacco Use   Smoking status: Former Smoker    Packs/day: 0.50    Years: 20.00    Pack years: 10.00    Types: Cigarettes    Start date: 04/30/2012    Quit date: 08/16/2012    Years since quitting: 7.4   Smokeless tobacco: Never Used  Vaping Use   Vaping Use: Never used  Substance and Sexual Activity   Alcohol use: No    Alcohol/week: 0.0 standard drinks   Drug use: No   Sexual activity: Never    Comment: was smoking 1/2 pppd  Other Topics Concern   Not on file  Social History Narrative   Lives at home alone.    Right-handed.   2 cups caffeine per day.   Social Determinants of Health   Financial Resource Strain:    Difficulty of Paying Living Expenses:   Food Insecurity:    Worried About Charity fundraiser in the Last Year:    Arboriculturist in the Last Year:   Transportation Needs:    Film/video editor (Medical):    Lack of Transportation (Non-Medical):   Physical Activity:    Days of Exercise per Week:    Minutes of Exercise per Session:   Stress:  Feeling of Stress :   Social Connections:    Frequency of Communication with Friends and Family:    Frequency of Social Gatherings with Friends and Family:    Attends Religious Services:    Active Member of Clubs or Organizations:    Attends Archivist Meetings:    Marital Status:   Intimate Partner Violence:    Fear of Current or Ex-Partner:    Emotionally Abused:    Physically Abused:    Sexually Abused:       PHYSICAL EXAM  Vitals:   01/28/20 1411  BP: 137/77  Pulse: 97  Weight: 163 lb (73.9 kg)  Height: 5' (1.524 m)   Body mass index is 31.83 kg/m.  Generalized: Well developed, in no acute distress  Chest: Lungs clear to auscultation bilaterally  Neurological examination  Mentation: Alert oriented to time, place, history taking. Follows all commands.  Expressive aphasia Cranial nerve II-XII: Extraocular movements were full, visual field were full on confrontational test Head turning and shoulder shrug  were normal and symmetric. Motor: The motor testing reveals 5 over 5 strength of all 4 extremities. Good symmetric motor tone is noted throughout.  Sensory: Sensory testing is intact to soft touch on all 4 extremities. No evidence of extinction is noted.  Gait and station: Gait is normal.    DIAGNOSTIC DATA (LABS, IMAGING, TESTING) - I reviewed patient records, labs, notes, testing and imaging myself where available.  Lab Results  Component Value Date   WBC 7.3 12/04/2017   HGB  12.9 12/04/2017   HCT 40.9 12/04/2017   MCV 85.7 12/04/2017   PLT 370 12/04/2017      Component Value Date/Time   NA 140 06/08/2019 1026   K 4.1 06/08/2019 1026   CL 105 06/08/2019 1026   CO2 23 06/08/2019 1026   GLUCOSE 124 (H) 06/08/2019 1026   GLUCOSE 100 (H) 11/30/2015 1150   BUN 11 06/08/2019 1026   CREATININE 1.01 (H) 06/08/2019 1026   CREATININE 0.73 10/30/2015 1232   CALCIUM 9.4 06/08/2019 1026   PROT 7.1 06/08/2019 1026   ALBUMIN 4.1 06/08/2019 1026   AST 29 06/08/2019 1026   ALT 31 06/08/2019 1026   ALKPHOS 117 06/08/2019 1026   BILITOT 0.7 06/08/2019 1026   GFRNONAA 57 (L) 06/08/2019 1026   GFRNONAA 87 10/30/2015 1232   GFRAA 66 06/08/2019 1026   GFRAA >89 10/30/2015 1232   Lab Results  Component Value Date   CHOL 162 06/08/2019   HDL 50 06/08/2019   LDLCALC 94 06/08/2019   TRIG 101 06/08/2019   CHOLHDL 3.2 06/08/2019   Lab Results  Component Value Date   HGBA1C 6.4 (H) 06/08/2019   No results found for: VITAMINB12 Lab Results  Component Value Date   TSH 2.360 10/13/2016      ASSESSMENT AND PLAN 69 y.o. year old female  has a past medical history of Arthritis, Depression, Diabetes mellitus without complication ([place]), Diverticulitis, GERD (gastroesophageal reflux disease), Headache, Heart murmur, Hyperlipidemia, Hypertension, Speech problem, and Wears glasses. here with:  OSA on CPAP   Discussed doing a mask refitting patient deferred does not plan on using CPAP therapy.  Order will be sent to discontinue therapy  Primary progressive aphasia   Continue monitoring symptoms   Advised if symptoms worsen or she develops new symptoms she should let us know Follow-up in 6 months or sooner if needed  I spent 20 minutes of face-to-face and non-face-to-face time with patient.  This included previsit chart  review, lab review, study review, order entry, electronic health record documentation, patient education.  Ward Givens, MSN, NP-C 01/28/2020,  2:14 PM [facility] [address] [place], [place] [address] [phone]

## 2020-01-29 ENCOUNTER — Telehealth: Payer: Self-pay | Admitting: *Deleted

## 2020-01-29 NOTE — Telephone Encounter (Signed)
Received: Today Kathee Delton, RN Orders rcvd, but pt does own her CPAP. Just FYI.       Previous Messages   ----- Message -----  From: Guy Begin, RN  Sent: 01/29/2020 10:35 AM EDT  To: Dimas Millin, Gala Lewandowsky  Subject: d/c cpap                     Good morning. Cpap order in epic for pt Meghan Hickman. [name] 69 y.o., 1950-11-26  MRN:  [id number]  Discontinue cpap therapy. Thanks, [facility]

## 2020-01-29 NOTE — Addendum Note (Signed)
Addended by: Enedina Finner on: 01/29/2020 09:06 AM   Modules accepted: Orders

## 2020-01-29 NOTE — Progress Notes (Signed)
CM sent to [facility] to d/c cpap therapy.  Received. sy

## 2020-05-23 ENCOUNTER — Ambulatory Visit (INDEPENDENT_AMBULATORY_CARE_PROVIDER_SITE_OTHER): Payer: Medicare Other | Admitting: Registered Nurse

## 2020-05-23 ENCOUNTER — Other Ambulatory Visit: Payer: Self-pay

## 2020-05-23 ENCOUNTER — Encounter: Payer: Self-pay | Admitting: Registered Nurse

## 2020-05-23 VITALS — BP 120/80 | HR 100 | Temp 97.2°F | Resp 18 | Ht 60.0 in | Wt 163.0 lb

## 2020-05-23 DIAGNOSIS — R062 Wheezing: Secondary | ICD-10-CM

## 2020-05-23 DIAGNOSIS — Z9229 Personal history of other drug therapy: Secondary | ICD-10-CM

## 2020-05-23 DIAGNOSIS — E1165 Type 2 diabetes mellitus with hyperglycemia: Secondary | ICD-10-CM | POA: Diagnosis not present

## 2020-05-23 LAB — COMPREHENSIVE METABOLIC PANEL
ALT: 59 IU/L — ABNORMAL HIGH (ref 0–32)
AST: 44 IU/L — ABNORMAL HIGH (ref 0–40)
Albumin/Globulin Ratio: 1.3 (ref 1.2–2.2)
Albumin: 4.4 g/dL (ref 3.8–4.8)
Alkaline Phosphatase: 138 IU/L — ABNORMAL HIGH (ref 44–121)
BUN/Creatinine Ratio: 16 (ref 12–28)
BUN: 15 mg/dL (ref 8–27)
Bilirubin Total: 0.9 mg/dL (ref 0.0–1.2)
CO2: 25 mmol/L (ref 20–29)
Calcium: 9.6 mg/dL (ref 8.7–10.3)
Chloride: 105 mmol/L (ref 96–106)
Creatinine, Ser: 0.94 mg/dL (ref 0.57–1.00)
GFR calc Af Amer: 71 mL/min/{1.73_m2} (ref >59)
GFR calc non Af Amer: 62 mL/min/{1.73_m2} (ref >59)
Globulin, Total: 3.4 g/dL (ref 1.5–4.5)
Glucose: 180 mg/dL — ABNORMAL HIGH (ref 65–99)
Potassium: 4.3 mmol/L (ref 3.5–5.2)
Sodium: 142 mmol/L (ref 134–144)
Total Protein: 7.8 g/dL (ref 6.0–8.5)

## 2020-05-23 LAB — POCT GLYCOSYLATED HEMOGLOBIN (HGB A1C): Hemoglobin A1C: 6.8 % — AB (ref 4.0–5.6)

## 2020-05-23 LAB — LIPID PANEL
Chol/HDL Ratio: 3.3 ratio (ref 0.0–4.4)
Cholesterol, Total: 187 mg/dL (ref 100–199)
HDL: 57 mg/dL (ref >39)
LDL Chol Calc (NIH): 110 mg/dL — ABNORMAL HIGH (ref 0–99)
Triglycerides: 111 mg/dL (ref 0–149)
VLDL Cholesterol Cal: 20 mg/dL (ref 5–40)

## 2020-05-23 LAB — CBC WITH DIFFERENTIAL/PLATELET
Basophils Absolute: 0.1 10*3/uL (ref 0.0–0.2)
Basos: 1 %
EOS (ABSOLUTE): 0.3 10*3/uL (ref 0.0–0.4)
Eos: 3 %
Hematocrit: 41 % (ref 34.0–46.6)
Hemoglobin: 12.9 g/dL (ref 11.1–15.9)
Immature Grans (Abs): 0 10*3/uL (ref 0.0–0.1)
Immature Granulocytes: 0 %
Lymphocytes Absolute: 2.9 10*3/uL (ref 0.7–3.1)
Lymphs: 29 %
MCH: 26.8 pg (ref 26.6–33.0)
MCHC: 31.5 g/dL (ref 31.5–35.7)
MCV: 85 fL (ref 79–97)
Monocytes Absolute: 0.6 10*3/uL (ref 0.1–0.9)
Monocytes: 6 %
Neutrophils Absolute: 6.1 10*3/uL (ref 1.4–7.0)
Neutrophils: 61 %
Platelets: 329 10*3/uL (ref 150–450)
RBC: 4.82 x10E6/uL (ref 3.77–5.28)
RDW: 12.6 % (ref 11.7–15.4)
WBC: 10 10*3/uL (ref 3.4–10.8)

## 2020-05-23 LAB — GLUCOSE, POCT (MANUAL RESULT ENTRY): POC Glucose: 190 mg/dl — AB (ref 70–99)

## 2020-05-23 MED ORDER — QVAR REDIHALER 80 MCG/ACT IN AERB: 1.0000 | INHALATION_SPRAY | Freq: Two times a day (BID) | RESPIRATORY_TRACT | 11 refills | Status: DC

## 2020-05-23 MED ORDER — PREDNISONE 10 MG (21) PO TBPK: ORAL_TABLET | ORAL | 0 refills | Status: DC

## 2020-05-23 NOTE — Patient Instructions (Signed)
° ° ° °  If you have lab work done today you will be contacted with your lab results within the next 2 weeks.  If you have not heard from us then please contact us. The fastest way to get your results is to register for My Chart. ° ° °IF you received an x-ray today, you will receive an invoice from [facility]. Please contact [facility] at [phone] with questions or concerns regarding your invoice.  ° °IF you received labwork today, you will receive an invoice from [facility]. Please contact [facility] at [phone] with questions or concerns regarding your invoice.  ° °Our billing staff will not be able to assist you with questions regarding bills from these companies. ° °You will be contacted with the lab results as soon as they are available. The fastest way to get your results is to activate your My Chart account. Instructions are located on the last page of this paperwork. If you have not heard from us regarding the results in 2 weeks, please contact this office. °  ° ° ° °

## 2020-05-23 NOTE — Progress Notes (Signed)
Established Patient Office Visit  Subjective:  Patient ID: Meghan Hickman, female    DOB: 04-13-1951  Age: 69 y.o. MRN: [id number]  CC:  Chief Complaint  Patient presents with   Cough    patient states over the last 6 months she has not been feeling well she has been experiencing some congestion , wheezing , fatigue and cough. Per patient she would like to check her A1c and glucose.    HPI Meghan Hickman presents for follow up  Has been feeling somewhat malaised for the past 6-12 months Had previously been on metformin for her t2dm but was taken off of this due to her great diet control. However, she's now getting sugars as high as the 300s and thinks that's contributing to her symptoms She also notes a persistent cough with mild mucus production  Past Medical History:  Diagnosis Date   Arthritis    Depression    Diabetes mellitus without complication ([place])    Diverticulitis    GERD (gastroesophageal reflux disease)    Headache    Heart murmur    Hyperlipidemia    Hypertension    Speech problem    Wears glasses     Past Surgical History:  Procedure Laterality Date   CHOLECYSTECTOMY N/A 12/05/2014   Procedure: LAPAROSCOPIC CHOLECYSTECTOMY;  Surgeon: Rolm Bookbinder, MD;  Location: [facility];  Service: General;  Laterality: N/A;   SHOULDER ARTHROSCOPY WITH SUBACROMIAL DECOMPRESSION AND BICEP TENDON REPAIR Right 04/23/2013   Procedure: SHOULDER ARTHROSCOPY WITH SUBACROMIAL DECOMPRESSION AND BICEP TENDON TENOTOMY;  Surgeon: Nita Sells, MD;  Location: [facility];  Service: Orthopedics;  Laterality: Right;    Family History  Problem Relation Age of Onset   Stroke Mother    Hypertension Mother    Diabetes Mother    Breast cancer Mother    Healthy Father    Heart disease Brother     Social History   Socioeconomic History   Marital status: Single    Spouse name: Not on file   Number of children: 0   Years of  education: 16   Highest education level: Not on file  Occupational History   Occupation: Scientist, research (medical)  Tobacco Use   Smoking status: Former Smoker    Packs/day: 0.50    Years: 20.00    Pack years: 10.00    Types: Cigarettes    Start date: 04/30/2012    Quit date: 08/16/2012    Years since quitting: 7.7   Smokeless tobacco: Never Used  Vaping Use   Vaping Use: Never used  Substance and Sexual Activity   Alcohol use: No    Alcohol/week: 0.0 standard drinks   Drug use: No   Sexual activity: Never    Comment: was smoking 1/2 pppd  Other Topics Concern   Not on file  Social History Narrative   Lives at home alone.   Right-handed.   2 cups caffeine per day.   Social Determinants of Health   Financial Resource Strain:    Difficulty of Paying Living Expenses: Not on file  Food Insecurity:    Worried About Charity fundraiser in the Last Year: Not on file   [employer] of Food in the Last Year: Not on file  Transportation Needs:    Lack of Transportation (Medical): Not on file   Lack of Transportation (Non-Medical): Not on file  Physical Activity:    Days of Exercise per Week: Not on file   Minutes of  Exercise per Session: Not on file  Stress:    Feeling of Stress : Not on file  Social Connections:    Frequency of Communication with Friends and Family: Not on file   Frequency of Social Gatherings with Friends and Family: Not on file   Attends Religious Services: Not on file   Active Member of Clubs or Organizations: Not on file   Attends Archivist Meetings: Not on file   Marital Status: Not on file  Intimate Partner Violence:    Fear of Current or Ex-Partner: Not on file   Emotionally Abused: Not on file   Physically Abused: Not on file   Sexually Abused: Not on file    Outpatient Medications Prior to Visit  Medication Sig Dispense Refill   albuterol (PROVENTIL HFA;VENTOLIN HFA) 108 (90 Base) MCG/ACT inhaler Inhale 2 puffs into the  lungs every 6 (six) hours as needed for wheezing or shortness of breath. 1 Inhaler 0   almotriptan (AXERT) 12.5 MG tablet Take 12.5 mg by mouth daily as needed for migraine.   4   ALPRAZolam (XANAX) 1 MG tablet Take 1-2 tablets thirty minutes prior to MRI.  May repeat with 1 additional tablet prior to entering scanner, if needed.  Must have driver. 3 tablet 0   aspirin 81 MG tablet Take 81 mg by mouth daily.     atorvastatin (LIPITOR) 20 MG tablet Take one tablet by mouth daily 90 tablet 3   blood glucose meter kit and supplies Dispense based on patient and insurance preference. Use up to three times daily as directed. (FOR ICD-10 E11.65). Dispense 100 lancets and test strips and 1 lancing device to match meter. 1 each 0   donepezil (ARICEPT) 10 MG tablet TAKE 1 TABLET BY MOUTH EVERYDAY AT BEDTIME 90 tablet 1   escitalopram (LEXAPRO) 20 MG tablet TAKE 1 TABLET BY MOUTH EVERY DAY 90 tablet 1   famotidine (PEPCID) 40 MG tablet Take 1 tablet (40 mg total) by mouth daily. 90 tablet 3   gabapentin (NEURONTIN) 300 MG capsule TAKE 1 CAPSULE BY MOUTH THREE TIMES A DAY 270 capsule 0   ipratropium (ATROVENT) 0.03 % nasal spray Place 2 sprays into both nostrils 2 (two) times daily. 30 mL 0   losartan-hydrochlorothiazide (HYZAAR) 50-12.5 MG tablet TAKE 1/2 TABLET BY MOUTH EVERY DAY 45 tablet 0   Multiple Vitamins-Minerals (MULTIVITAMIN & MINERAL PO) Take 1 tablet by mouth daily.     ONETOUCH ULTRA test strip USE UP TO 3 TIMES A DAY AS DIRECTED 100 strip 2   temazepam (RESTORIL) 30 MG capsule TAKE 1 CAPSULE BY MOUTH AT BEDTIME AS NEEDED FOR SLEEP 30 capsule 2   traMADol (ULTRAM) 50 MG tablet TAKE 1 TABLET BY MOUTH EVERY 6 HOURS AS NEEDED 30 tablet 0   No facility-administered medications prior to visit.    No Known Allergies  ROS Review of Systems Per hpi    Objective:    Physical Exam Vitals and nursing note reviewed.  Constitutional:      General: She is not in acute distress.     Appearance: Normal appearance. She is normal weight. She is not ill-appearing, toxic-appearing or diaphoretic.  Cardiovascular:     Rate and Rhythm: Normal rate and regular rhythm.     Heart sounds: Normal heart sounds. No murmur heard.  No friction rub. No gallop.   Pulmonary:     Effort: Pulmonary effort is normal. No respiratory distress.     Breath sounds: No stridor.  Wheezing (bilat throughout) present. No rhonchi or rales.  Chest:     Chest wall: No tenderness.  Skin:    General: Skin is warm and dry.  Neurological:     General: No focal deficit present.     Mental Status: She is alert and oriented to person, place, and time. Mental status is at baseline.  Psychiatric:        Mood and Affect: Mood normal.        Behavior: Behavior normal.        Thought Content: Thought content normal.        Judgment: Judgment normal.     BP 120/80    Pulse 100    Temp (!) 97.2 F (36.2 C) (Temporal)    Resp 18    Ht 5' (1.524 m)    Wt 163 lb (73.9 kg)    SpO2 96%    BMI 31.83 kg/m  Wt Readings from Last 3 Encounters:  05/23/20 163 lb (73.9 kg)  01/28/20 163 lb (73.9 kg)  07/18/19 161 lb 9.6 oz (73.3 kg)     Health Maintenance Due  Topic Date Due   INFLUENZA VACCINE  03/09/2020    There are no preventive care reminders to display for this patient.  Lab Results  Component Value Date   TSH 2.360 10/13/2016   Lab Results  Component Value Date   WBC 7.3 12/04/2017   HGB 12.9 12/04/2017   HCT 40.9 12/04/2017   MCV 85.7 12/04/2017   PLT 370 12/04/2017   Lab Results  Component Value Date   NA 140 06/08/2019   K 4.1 06/08/2019   CO2 23 06/08/2019   GLUCOSE 124 (H) 06/08/2019   BUN 11 06/08/2019   CREATININE 1.01 (H) 06/08/2019   BILITOT 0.7 06/08/2019   ALKPHOS 117 06/08/2019   AST 29 06/08/2019   ALT 31 06/08/2019   PROT 7.1 06/08/2019   ALBUMIN 4.1 06/08/2019   CALCIUM 9.4 06/08/2019   ANIONGAP 11 11/30/2015   Lab Results  Component Value Date   CHOL 162  06/08/2019   Lab Results  Component Value Date   HDL 50 06/08/2019   Lab Results  Component Value Date   LDLCALC 94 06/08/2019   Lab Results  Component Value Date   TRIG 101 06/08/2019   Lab Results  Component Value Date   CHOLHDL 3.2 06/08/2019   Lab Results  Component Value Date   HGBA1C 6.8 (A) 05/23/2020      Assessment & Plan:   Problem List Items Addressed This Visit    None    Visit Diagnoses    Type 2 diabetes mellitus with hyperglycemia, without long-term current use of insulin (HCC)    -  Primary   Relevant Orders   POCT glycosylated hemoglobin (Hb A1C) (Completed)   POCT glucose (manual entry) (Completed)   CBC with Differential   Comprehensive metabolic panel   Lipid Panel   COVID-19 vaccine series completed       Relevant Orders   SAR CoV2 Serology (COVID 19)AB(IGG)IA   Expiratory wheezing       Relevant Medications   beclomethasone (QVAR REDIHALER) 80 MCG/ACT inhaler   predniSONE (STERAPRED UNI-PAK 21 TAB) 10 MG (21) TBPK tablet      Meds ordered this encounter  Medications   beclomethasone (QVAR REDIHALER) 80 MCG/ACT inhaler    Sig: Inhale 1 puff into the lungs 2 (two) times daily.    Dispense:  1 each    Refill:  11    Order Specific Question:   Supervising Provider    Answer:   Carlota Raspberry, JEFFREY R [[id number]]   predniSONE (STERAPRED UNI-PAK 21 TAB) 10 MG (21) TBPK tablet    Sig: Take per package instructions. Do not skip doses. Finish entire supply.    Dispense:  1 each    Refill:  0    Order Specific Question:   Supervising Provider    Answer:   Carlota Raspberry, JEFFREY R [[id number]]    Follow-up: No follow-ups on file.   PLAN  Suspect asthma exacerbation  qvar twice daily.   Prednisone taper   Labs collected - will follow up as warranted. poct a1c shows 6.8 - satisfactory at this time  Patient encouraged to call clinic with any questions, comments, or concerns.  Maximiano Coss, NP

## 2020-05-24 LAB — SAR COV2 SEROLOGY (COVID19)AB(IGG),IA: DiaSorin SARS-CoV-2 Ab, IgG: POSITIVE

## 2020-05-26 ENCOUNTER — Encounter: Payer: Self-pay | Admitting: Radiology

## 2020-05-26 NOTE — Progress Notes (Signed)
Good morning, letter please:  Labs looking steady. No acute concerns. Let's keep those sugars in check. See you in 3 months.  Jari Sportsman, NP

## 2020-05-29 ENCOUNTER — Other Ambulatory Visit: Payer: Self-pay

## 2020-05-29 ENCOUNTER — Telehealth: Payer: Self-pay | Admitting: Neurology

## 2020-05-29 ENCOUNTER — Ambulatory Visit (INDEPENDENT_AMBULATORY_CARE_PROVIDER_SITE_OTHER): Payer: Medicare Other | Admitting: Neurology

## 2020-05-29 ENCOUNTER — Encounter: Payer: Self-pay | Admitting: Neurology

## 2020-05-29 VITALS — BP 143/79 | HR 90 | Ht 60.0 in | Wt 163.5 lb

## 2020-05-29 DIAGNOSIS — G3101 Pick's disease: Secondary | ICD-10-CM | POA: Diagnosis not present

## 2020-05-29 DIAGNOSIS — G4733 Obstructive sleep apnea (adult) (pediatric): Secondary | ICD-10-CM | POA: Diagnosis not present

## 2020-05-29 DIAGNOSIS — Z9989 Dependence on other enabling machines and devices: Secondary | ICD-10-CM | POA: Diagnosis not present

## 2020-05-29 DIAGNOSIS — F0280 Dementia in other diseases classified elsewhere without behavioral disturbance: Secondary | ICD-10-CM | POA: Diagnosis not present

## 2020-05-29 MED ORDER — DONEPEZIL HCL 10 MG PO TABS
10.0000 mg | ORAL_TABLET | Freq: Every day | ORAL | 4 refills | Status: DC
Start: 2020-05-29 — End: 2021-06-15

## 2020-05-29 NOTE — Telephone Encounter (Signed)
Medicare/bcbs fed order sent to GI. No auth they will reach out to the patient to schedule.  °

## 2020-05-29 NOTE — Progress Notes (Signed)
Chief Complaint  Patient presents with  . OSA on CPAP    She is no longer using her CPAP machine. It is not comfortable and it makes her nose too cold.  She able to sleep all night but still has excessive daytime drowsiness  . Expressive Aphasia/Small Vessel Disease    Feels her aphasia is worse. Memory is stable. She has a device now that assists with speech but she is having trouble getting it to work today.     Sheldon, seen in request by  I reviewed and summarized the referring note.  Past medical history Obstructive sleep apnea, could not tolerate CPAP machine Migraine headaches Depression  I reviewed most recent office visit in February 2020 by [facility]ist Dr. Manuella Ghazi, [place].  I reviewed, patient was given diagnosis of primary progressive aphasia, donepezil 10 mg daily   From reviewing the chart, patient reported her symptoms started gradually around April 2016 and progressively getting worse, she noted speech problem after root canal, she said she has trouble pronouncing words, spelling words and remembering words, she states the worst in her hand will not, all, she does not feel like her writing has changed, she is unsure if her joint has changed, she had no problem working with her hands such as cooking cleaning and using instrument.  She feels as if she could not spell like what she could before  While reading she has to really slowly even 1 obturating of mild, she put on the subtitles 1 watching TV, but has difficulty keeping up with subtitles.  She denies visual or auditory hallucinations, she denies difficulty swallowing,  She feels like exhausted her to talk, she feels as takes a lot of energy to talk, she has tried and failed speech therapy,  EEG reported normal February 2019, MRI of the brain 2019, mild periventricular, subcortical and pontine small vessel disease  She was given the diagnosis of primary progressive aphasia  Meghan Hickman  is a 68 years old right-handed female, alone at today's clinical visit, seen in refer by  her primary care physician Dr. Ivar Bury in July 08 2015 for evaluation of speech difficulty  She had a history of hypertension, hyperlipidemia, diabetes, depression, was recently started on Depakote 500 mg daily  She began to noticed gradual onset slurred speech since April 2016, initially attributed to her dental work, even after it was fixed, she continue have progressive slow talking, struggle with words, intermittent slurring, she denies chewing swallowing difficulty, she denies visual loss, she work as a Astronomer, no limb muscle weakness,  She recently found to have enlarged thyroid, needle biopsy is planned, ultrasound of carotid artery was patent,  We have Reviewed CAT scan in October 2016, periventricular white matter disease, no acute lesions  UPDATE Jul 24 2015: She still struggle with words, mild slur,word finding difficulty, mild confusion. MRI of the brain reviewed, mild small vessel disease, more on left hemisphere  She has no acute lesions  UPDATE May 15th 2017:  She suffered sinus infection in early May, is treated with antibiotics, she had a temporary upper denture, complains of falling object sensation, mildly slurred speech, but overall is getting better, denies swallowing difficulty, she denies limb muscle weakness  I have reviewed laboratory evaluation, elevated CBC in Dec 18 2014 16.2,  Personally reviewed MRI of the brain with patients again, supratentorium small vessel disease, especially at the deep white matter, she also complains of worsening depression, bilateral  ear tinnitus,  UPDATE Sep 15 2017: Her speech is getting worse, the words would not come out, she has no trouble swallowing, no comprehensive problem. She has no double vision.   She has mild memory loss.   She has no family history of memory loss.  She has no weakness.  She still works at  post office, Agricultural engineer. She complains of depression for many years, she lives alone.  She goes out with her friends occasionally.  She also complains of vibratory sensation in her head, snoring, frequent awakening, excessive daytime fatigue and sleepiness  UPDATE May 25 2018: She was seen at [facility] in July 2019 by Dr. Manuella Ghazi, was diagnosed with primary progressive aphasia, no trouble understanding, but has word finding difficulty, gradually getting worse.  She still works at post [employer], lives alone, denies significant memory loss  UPDATE May 29 2020: She  was diagnosed with obstructive sleep apnea, using CPAP machine, but stopped using it because she could not tolerate the facial mask,  She was followed by [facility] neurology, most recent visit in February 2020, was diagnosed with primary progressive aphasia, started on Aricept  Today she came in with a list of questions, denies swallowing difficulty, still lives alone, using device to help her pronounce, seems to have no comprehensive difficulty, when she does talk, with deliberate effort,she was able to write without much difficulty  REVIEW OF SYSTEMS: Full 14 system review of systems performed and notable only for as above All other review of systems were negative.  ALLERGIES: No Known Allergies  HOME MEDICATIONS: Current Outpatient Medications  Medication Sig Dispense Refill  . albuterol (PROVENTIL HFA;VENTOLIN HFA) 108 (90 Base) MCG/ACT inhaler Inhale 2 puffs into the lungs every 6 (six) hours as needed for wheezing or shortness of breath. 1 Inhaler 0  . almotriptan (AXERT) 12.5 MG tablet Take 12.5 mg by mouth daily as needed for migraine.   4  . ALPRAZolam (XANAX) 1 MG tablet Take 1-2 tablets thirty minutes prior to MRI.  May repeat with 1 additional tablet prior to entering scanner, if needed.  Must have driver. 3 tablet 0  . aspirin 81 MG tablet Take 81 mg by mouth daily.    [name] Kitchen atorvastatin (LIPITOR) 20 MG tablet Take  one tablet by mouth daily 90 tablet 3  . beclomethasone (QVAR REDIHALER) 80 MCG/ACT inhaler Inhale 1 puff into the lungs 2 (two) times daily. 1 each 11  . blood glucose meter kit and supplies Dispense based on patient and insurance preference. Use up to three times daily as directed. (FOR ICD-10 E11.65). Dispense 100 lancets and test strips and 1 lancing device to match meter. 1 each 0  . donepezil (ARICEPT) 10 MG tablet TAKE 1 TABLET BY MOUTH EVERYDAY AT BEDTIME 90 tablet 1  . escitalopram (LEXAPRO) 20 MG tablet TAKE 1 TABLET BY MOUTH EVERY DAY 90 tablet 1  . famotidine (PEPCID) 40 MG tablet Take 1 tablet (40 mg total) by mouth daily. 90 tablet 3  . gabapentin (NEURONTIN) 300 MG capsule TAKE 1 CAPSULE BY MOUTH THREE TIMES A DAY 270 capsule 0  . ipratropium (ATROVENT) 0.03 % nasal spray Place 2 sprays into both nostrils 2 (two) times daily. 30 mL 0  . losartan-hydrochlorothiazide (HYZAAR) 50-12.5 MG tablet TAKE 1/2 TABLET BY MOUTH EVERY DAY 45 tablet 0  . Multiple Vitamins-Minerals (MULTIVITAMIN & MINERAL PO) Take 1 tablet by mouth daily.    Glory Rosebush ULTRA test strip USE UP TO 3 TIMES A DAY  AS DIRECTED 100 strip 2  . temazepam (RESTORIL) 30 MG capsule TAKE 1 CAPSULE BY MOUTH AT BEDTIME AS NEEDED FOR SLEEP 30 capsule 2  . traMADol (ULTRAM) 50 MG tablet TAKE 1 TABLET BY MOUTH EVERY 6 HOURS AS NEEDED 30 tablet 0   No current facility-administered medications for this visit.    PAST MEDICAL HISTORY: Past Medical History:  Diagnosis Date  . Arthritis   . Depression   . Diabetes mellitus without complication ([place])   . Diverticulitis   . GERD (gastroesophageal reflux disease)   . Headache   . Heart murmur   . Hyperlipidemia   . Hypertension   . Speech problem   . Wears glasses     PAST SURGICAL HISTORY: Past Surgical History:  Procedure Laterality Date  . CHOLECYSTECTOMY N/A 12/05/2014   Procedure: LAPAROSCOPIC CHOLECYSTECTOMY;  Surgeon: Rolm Bookbinder, MD;  Location: [place];   Service: General;  Laterality: N/A;  . SHOULDER ARTHROSCOPY WITH SUBACROMIAL DECOMPRESSION AND BICEP TENDON REPAIR Right 04/23/2013   Procedure: SHOULDER ARTHROSCOPY WITH SUBACROMIAL DECOMPRESSION AND BICEP TENDON TENOTOMY;  Surgeon: Nita Sells, MD;  Location: [place];  Service: Orthopedics;  Laterality: Right;    FAMILY HISTORY: Family History  Problem Relation Age of Onset  . Stroke Mother   . Hypertension Mother   . Diabetes Mother   . Breast cancer Mother   . Healthy Father   . Heart disease Brother     SOCIAL HISTORY: Social History   Socioeconomic History  . Marital status: Single    Spouse name: Not on file  . Number of children: 0  . Years of education: 86  . Highest education level: Not on file  Occupational History  . Occupation: Scientist, research (medical)  Tobacco Use  . Smoking status: Former Smoker    Packs/day: 0.50    Years: 20.00    Pack years: 10.00    Types: Cigarettes    Start date: 04/30/2012    Quit date: 08/16/2012    Years since quitting: 7.7  . Smokeless tobacco: Never Used  Vaping Use  . Vaping Use: Never used  Substance and Sexual Activity  . Alcohol use: No    Alcohol/week: 0.0 standard drinks  . Drug use: No  . Sexual activity: Never    Comment: was smoking 1/2 pppd  Other Topics Concern  . Not on file  Social History Narrative   Lives at home alone.   Right-handed.   2 cups caffeine per day.   Social Determinants of Health   Financial Resource Strain:   . Difficulty of Paying Living Expenses: Not on file  Food Insecurity:   . Worried About Charity fundraiser in the Last Year: Not on file  . Ran Out of Food in the Last Year: Not on file  Transportation Needs:   . Lack of Transportation (Medical): Not on file  . Lack of Transportation (Non-Medical): Not on file  Physical Activity:   . Days of Exercise per Week: Not on file  . Minutes of Exercise per Session: Not on file  Stress:   . Feeling of Stress : Not on  file  Social Connections:   . Frequency of Communication with Friends and Family: Not on file  . Frequency of Social Gatherings with Friends and Family: Not on file  . Attends Religious Services: Not on file  . Active Member of Clubs or Organizations: Not on file  . Attends Archivist Meetings: Not on file  .  Marital Status: Not on file  Intimate Partner Violence:   . Fear of Current or Ex-Partner: Not on file  . Emotionally Abused: Not on file  . Physically Abused: Not on file  . Sexually Abused: Not on file     PHYSICAL EXAM   Vitals:   05/29/20 1339  BP: (!) 143/79  Pulse: 90  Weight: 163 lb 8 oz (74.2 kg)  Height: 5' (1.524 m)   Not recorded     Body mass index is 31.93 kg/m.  PHYSICAL EXAMNIATION:  Gen: NAD, conversant, well nourised, well groomed                     Cardiovascular: Regular rate rhythm, no peripheral edema, warm, nontender. Eyes: Conjunctivae clear without exudates or hemorrhage Neck: Supple, no carotid bruits. Pulmonary: Clear to auscultation bilaterally   NEUROLOGICAL EXAM:  MENTAL STATUS: Speech/cognition: Normal comprehension, able to pronounce with deliberate effort, no paraphasic error, writing without much difficulty   CRANIAL NERVES: CN II: Visual fields are full to confrontation. Pupils are round equal and briskly reactive to light. CN III, IV, VI: extraocular movement are normal. No ptosis. CN V: Facial sensation is intact to light touch CN VII: Face is symmetric with normal eye closure  CN VIII: Hearing is normal to causal conversation. CN IX, X: Phonation is normal. CN XI: Head turning and shoulder shrug are intact  MOTOR: There is no pronator drift of out-stretched arms. Muscle bulk and tone are normal. Muscle strength is normal.  REFLEXES: Reflexes are 2+ and symmetric at the biceps, triceps, knees, and ankles. Plantar responses are flexor.  SENSORY: Intact to light touch, pinprick and vibratory sensation are  intact in fingers and toes.  COORDINATION: There is no trunk or limb dysmetria noted.  GAIT/STANCE: Posture is normal. Gait is steady with normal steps, base, arm swing, and turning. Heel and toe walking are normal. Tandem gait is normal.  Romberg is absent.   DIAGNOSTIC DATA (LABS, IMAGING, TESTING) - I reviewed patient records, labs, notes, testing and imaging myself where available.   ASSESSMENT AND PLAN  Meghan Hickman is a 70 y.o. female   Language difficulty,  Unsure etiology, patient reported continued progression of her symptoms, worry about the long-term prognosis,  She was diagnosed with primary progressive aphasia, but her speech pattern, well-preserved cognitive function, lack of significant progress by examination despite her proclaimed worsening symptom argues against a true central nervous system degenerative disorder  She wants to proceed with further evaluation,  Repeat MRI of the brain  Evaluation to rule out treatable etiology  Please feel Aricept 10 mg daily   Marcial Pacas, M.D. Ph.D.  [facility] [address] [place], [place] [address] Ph: [phone] Fax: [phone]  CC:  Forrest Moron, MD [phone] [place],  [place] [address]  Maximiano Coss, NP

## 2020-05-30 LAB — ANA W/REFLEX IF POSITIVE: Anti Nuclear Antibody (ANA): NEGATIVE

## 2020-05-30 LAB — TSH: TSH: 2.55 u[IU]/mL (ref 0.450–4.500)

## 2020-05-30 LAB — VITAMIN B12: Vitamin B-12: 738 pg/mL (ref 232–1245)

## 2020-05-30 LAB — HIV ANTIBODY (ROUTINE TESTING W REFLEX): HIV Screen 4th Generation wRfx: NONREACTIVE

## 2020-05-30 LAB — RPR: RPR Ser Ql: NONREACTIVE

## 2020-05-30 LAB — FOLATE: Folate: 8.3 ng/mL (ref >3.0)

## 2020-06-02 ENCOUNTER — Other Ambulatory Visit: Payer: Self-pay | Admitting: Registered Nurse

## 2020-06-02 ENCOUNTER — Telehealth: Payer: Self-pay | Admitting: *Deleted

## 2020-06-02 ENCOUNTER — Telehealth: Payer: Self-pay

## 2020-06-02 DIAGNOSIS — G2581 Restless legs syndrome: Secondary | ICD-10-CM

## 2020-06-02 DIAGNOSIS — G894 Chronic pain syndrome: Secondary | ICD-10-CM

## 2020-06-02 DIAGNOSIS — E1165 Type 2 diabetes mellitus with hyperglycemia: Secondary | ICD-10-CM

## 2020-06-02 MED ORDER — TEMAZEPAM 30 MG PO CAPS: ORAL_CAPSULE | ORAL | 2 refills | Status: DC

## 2020-06-02 MED ORDER — TRAZODONE HCL 50 MG PO TABS: 25.0000 mg | ORAL_TABLET | Freq: Every evening | ORAL | 3 refills | Status: DC | PRN

## 2020-06-02 MED ORDER — TRAMADOL HCL 50 MG PO TABS: 50.0000 mg | ORAL_TABLET | Freq: Four times a day (QID) | ORAL | 0 refills | Status: DC | PRN

## 2020-06-02 MED ORDER — DAPAGLIFLOZIN PROPANEDIOL 5 MG PO TABS: 5.0000 mg | ORAL_TABLET | Freq: Every day | ORAL | 3 refills | Status: DC

## 2020-06-02 NOTE — Telephone Encounter (Signed)
Pt came into office and dropped off note that reads: Meghan Hickman 11/21/2050 I am here on 05/23/20. Blood work and prescriptions. I am not heard from you. No Medicine.   Attempted call to pt her lab work from our office was stable no concerns we sent out a lab letter with this as well as printed results on 05/26/2020. Need to know what medicine pt needs, where she would like it sent.  Pt also needs an appointment as she has not been seen since November 2020.

## 2020-06-02 NOTE — Telephone Encounter (Signed)
Pt was aparently still in office, pt needed rx that was meant to be sent in for her DM control a few weeks ago as well as a few refills pt was able to speak with Kateri Plummer and he is sending Rx at this time along with refills for others requested

## 2020-06-02 NOTE — Telephone Encounter (Signed)
Dr. Epimenio Foot reviewed lab work in Dr. Zannie Cove absence. Normal results. I have called the patient and provided her with this information.

## 2020-06-04 ENCOUNTER — Telehealth: Payer: Self-pay | Admitting: *Deleted

## 2020-06-04 NOTE — Telephone Encounter (Signed)
Approval for Meghan Hickman This request has received an approval.   Pharmacy  Notified, left message for patient

## 2020-06-09 ENCOUNTER — Other Ambulatory Visit: Payer: Federal, State, Local not specified - PPO

## 2020-06-10 ENCOUNTER — Other Ambulatory Visit: Payer: Self-pay | Admitting: Neurology

## 2020-06-10 DIAGNOSIS — G4733 Obstructive sleep apnea (adult) (pediatric): Secondary | ICD-10-CM

## 2020-06-10 DIAGNOSIS — G3101 Pick's disease: Secondary | ICD-10-CM

## 2020-06-10 DIAGNOSIS — F0280 Dementia in other diseases classified elsewhere without behavioral disturbance: Secondary | ICD-10-CM

## 2020-06-10 DIAGNOSIS — Z9989 Dependence on other enabling machines and devices: Secondary | ICD-10-CM

## 2020-06-11 ENCOUNTER — Ambulatory Visit
Admission: RE | Admit: 2020-06-11 | Discharge: 2020-06-11 | Disposition: A | Discharge status: Home / Self Care | Payer: Federal, State, Local not specified - PPO | Source: Ambulatory Visit | Attending: Neurology | Admitting: Neurology

## 2020-06-11 ENCOUNTER — Other Ambulatory Visit: Payer: Self-pay

## 2020-06-11 DIAGNOSIS — G3101 Pick's disease: Secondary | ICD-10-CM

## 2020-06-11 DIAGNOSIS — Z9989 Dependence on other enabling machines and devices: Secondary | ICD-10-CM

## 2020-06-11 DIAGNOSIS — F0280 Dementia in other diseases classified elsewhere without behavioral disturbance: Secondary | ICD-10-CM

## 2020-06-11 DIAGNOSIS — G4733 Obstructive sleep apnea (adult) (pediatric): Secondary | ICD-10-CM

## 2020-06-12 ENCOUNTER — Telehealth: Payer: Self-pay | Admitting: Neurology

## 2020-06-12 NOTE — Telephone Encounter (Signed)
  MRI brain (without) demonstrating: - Mild periventricular and 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  - No acute findings.   Please call patient, MRI of the brain showed age-related changes, there was no acute abnormalities.

## 2020-06-12 NOTE — Telephone Encounter (Signed)
I have spoken to the patient and provided her with the MRI brain results. She verbalized understanding.

## 2020-06-17 ENCOUNTER — Telehealth: Payer: Self-pay | Admitting: *Deleted

## 2020-06-17 NOTE — Telephone Encounter (Signed)
Appro[place]on October 27 Your PA request has been approved. Additional information will be provided in the approval communication. (Message [id number])

## 2020-06-26 ENCOUNTER — Other Ambulatory Visit: Payer: Self-pay | Admitting: Registered Nurse

## 2020-06-26 DIAGNOSIS — G2581 Restless legs syndrome: Secondary | ICD-10-CM

## 2020-06-26 NOTE — Telephone Encounter (Signed)
Pharmacy is requesting changes to original Rx- sent for review of request 

## 2020-07-06 ENCOUNTER — Encounter: Payer: Self-pay | Admitting: Registered Nurse

## 2020-07-14 ENCOUNTER — Ambulatory Visit: Payer: Federal, State, Local not specified - PPO | Admitting: Neurology

## 2020-08-12 ENCOUNTER — Other Ambulatory Visit: Payer: Self-pay

## 2020-08-12 ENCOUNTER — Encounter: Payer: Self-pay | Admitting: Registered Nurse

## 2020-08-12 ENCOUNTER — Ambulatory Visit (INDEPENDENT_AMBULATORY_CARE_PROVIDER_SITE_OTHER): Payer: Medicare Other | Admitting: Registered Nurse

## 2020-08-12 VITALS — BP 110/75 | HR 90 | Temp 97.7°F | Resp 18 | Ht 60.0 in | Wt 163.6 lb

## 2020-08-12 DIAGNOSIS — E1165 Type 2 diabetes mellitus with hyperglycemia: Secondary | ICD-10-CM | POA: Diagnosis not present

## 2020-08-12 DIAGNOSIS — Z23 Encounter for immunization: Secondary | ICD-10-CM

## 2020-08-12 LAB — POCT GLYCOSYLATED HEMOGLOBIN (HGB A1C): Hemoglobin A1C: 6.3 % — AB (ref 4.0–5.6)

## 2020-08-12 MED ORDER — METFORMIN HCL 500 MG PO TABS: 500.0000 mg | ORAL_TABLET | Freq: Every day | ORAL | 0 refills | Status: DC

## 2020-08-12 NOTE — Patient Instructions (Signed)
° ° ° °  If you have lab work done today you will be contacted with your lab results within the next 2 weeks.  If you have not heard from us then please contact us. The fastest way to get your results is to register for My Chart. ° ° °IF you received an x-ray today, you will receive an invoice from [facility]. Please contact [facility] at [phone] with questions or concerns regarding your invoice.  ° °IF you received labwork today, you will receive an invoice from [facility]. Please contact [facility] at [phone] with questions or concerns regarding your invoice.  ° °Our billing staff will not be able to assist you with questions regarding bills from these companies. ° °You will be contacted with the lab results as soon as they are available. The fastest way to get your results is to activate your My Chart account. Instructions are located on the last page of this paperwork. If you have not heard from us regarding the results in 2 weeks, please contact this office. °  ° ° ° °

## 2020-08-12 NOTE — Progress Notes (Signed)
Established Patient Office Visit  Subjective:  Patient ID: Meghan Hickman, female    DOB: 15-Mar-1951  Age: 70 y.o. MRN: [id number]  CC:  Chief Complaint  Patient presents with  . Follow-up    Per patient she is here for her follow up for her diabetes.    HPI Meghan Hickman presents for 3 mo follow up on t2dm  Last A1c: 6.8 in Oct 2021 Currently taking: lifestyle  No new complications Reports good compliance with lifestyle Diet has been improved Exercise habits have been limited but steady.   Continues to report postprandial fatigue  Felt better when she had been on metformin in the past Postprandial glucose is up as high as 260 despite improvement in a1c Weight has been steady after increase in early 2020. No other concerns.  Past Medical History:  Diagnosis Date  . Arthritis   . Depression   . Diabetes mellitus without complication ([place])   . Diverticulitis   . GERD (gastroesophageal reflux disease)   . Headache   . Heart murmur   . Hyperlipidemia   . Hypertension   . Speech problem   . Wears glasses     Past Surgical History:  Procedure Laterality Date  . CHOLECYSTECTOMY N/A 12/05/2014   Procedure: LAPAROSCOPIC CHOLECYSTECTOMY;  Surgeon: Rolm Bookbinder, MD;  Location: [facility];  Service: General;  Laterality: N/A;  . SHOULDER ARTHROSCOPY WITH SUBACROMIAL DECOMPRESSION AND BICEP TENDON REPAIR Right 04/23/2013   Procedure: SHOULDER ARTHROSCOPY WITH SUBACROMIAL DECOMPRESSION AND BICEP TENDON TENOTOMY;  Surgeon: Nita Sells, MD;  Location: [facility];  Service: Orthopedics;  Laterality: Right;    Family History  Problem Relation Age of Onset  . Stroke Mother   . Hypertension Mother   . Diabetes Mother   . Breast cancer Mother   . Healthy Father   . Heart disease Brother     Social History   Socioeconomic History  . Marital status: Single    Spouse name: Not on file  . Number of children: 0  . Years of education: 68  .  Highest education level: Not on file  Occupational History  . Occupation: Scientist, research (medical)  Tobacco Use  . Smoking status: Former Smoker    Packs/day: 0.50    Years: 20.00    Pack years: 10.00    Types: Cigarettes    Start date: 04/30/2012    Quit date: 08/16/2012    Years since quitting: 7.9  . Smokeless tobacco: Never Used  Vaping Use  . Vaping Use: Never used  Substance and Sexual Activity  . Alcohol use: No    Alcohol/week: 0.0 standard drinks  . Drug use: No  . Sexual activity: Never    Comment: was smoking 1/2 pppd  Other Topics Concern  . Not on file  Social History Narrative   Lives at home alone.   Right-handed.   2 cups caffeine per day.   Social Determinants of Health   Financial Resource Strain: Not on file  Food Insecurity: Not on file  Transportation Needs: Not on file  Physical Activity: Not on file  Stress: Not on file  Social Connections: Not on file  Intimate Partner Violence: Not on file    Outpatient Medications Prior to Visit  Medication Sig Dispense Refill  . albuterol (PROVENTIL HFA;VENTOLIN HFA) 108 (90 Base) MCG/ACT inhaler Inhale 2 puffs into the lungs every 6 (six) hours as needed for wheezing or shortness of breath. 1 Inhaler 0  . almotriptan (AXERT)  12.5 MG tablet Take 12.5 mg by mouth daily as needed for migraine.   4  . ALPRAZolam (XANAX) 1 MG tablet Take 1-2 tablets thirty minutes prior to MRI.  May repeat with 1 additional tablet prior to entering scanner, if needed.  Must have driver. 3 tablet 0  . aspirin 81 MG tablet Take 81 mg by mouth daily.    [name] Kitchen atorvastatin (LIPITOR) 20 MG tablet Take one tablet by mouth daily 90 tablet 3  . beclomethasone (QVAR REDIHALER) 80 MCG/ACT inhaler Inhale 1 puff into the lungs 2 (two) times daily. 1 each 11  . blood glucose meter kit and supplies Dispense based on patient and insurance preference. Use up to three times daily as directed. (FOR ICD-10 E11.65). Dispense 100 lancets and test strips and 1 lancing  device to match meter. 1 each 0  . dapagliflozin propanediol (FARXIGA) 5 MG TABS tablet Take 1 tablet (5 mg total) by mouth daily before breakfast. 90 tablet 3  . donepezil (ARICEPT) 10 MG tablet Take 1 tablet (10 mg total) by mouth at bedtime. 90 tablet 4  . escitalopram (LEXAPRO) 20 MG tablet TAKE 1 TABLET BY MOUTH EVERY DAY 90 tablet 1  . famotidine (PEPCID) 40 MG tablet Take 1 tablet (40 mg total) by mouth daily. 90 tablet 3  . gabapentin (NEURONTIN) 300 MG capsule TAKE 1 CAPSULE BY MOUTH THREE TIMES A DAY 270 capsule 0  . ipratropium (ATROVENT) 0.03 % nasal spray Place 2 sprays into both nostrils 2 (two) times daily. 30 mL 0  . losartan-hydrochlorothiazide (HYZAAR) 50-12.5 MG tablet TAKE 1/2 TABLET BY MOUTH EVERY DAY 45 tablet 0  . Multiple Vitamins-Minerals (MULTIVITAMIN & MINERAL PO) Take 1 tablet by mouth daily.    Glory Rosebush ULTRA test strip USE UP TO 3 TIMES A DAY AS DIRECTED 100 strip 2  . temazepam (RESTORIL) 30 MG capsule TAKE 1 CAPSULE BY MOUTH AT BEDTIME AS NEEDED FOR SLEEP 30 capsule 2  . traMADol (ULTRAM) 50 MG tablet Take 1 tablet (50 mg total) by mouth every 6 (six) hours as needed. 30 tablet 0  . traZODone (DESYREL) 50 MG tablet TAKE 1/2 TO 1 TABLET BY MOUTH AT BEDTIME AS NEEDED FOR SLEEP 90 tablet 2   No facility-administered medications prior to visit.    No Known Allergies  ROS Review of Systems  Constitutional: Negative.   HENT: Negative.   Eyes: Negative.   Respiratory: Negative.   Cardiovascular: Negative.   Gastrointestinal: Negative.   Genitourinary: Negative.   Musculoskeletal: Negative.   Skin: Negative.   Neurological: Negative.   Psychiatric/Behavioral: Negative.   All other systems reviewed and are negative.     Objective:    Physical Exam Vitals and nursing note reviewed.  Constitutional:      General: She is not in acute distress.    Appearance: Normal appearance. She is normal weight. She is not ill-appearing, toxic-appearing or  diaphoretic.  Cardiovascular:     Rate and Rhythm: Normal rate and regular rhythm.     Heart sounds: Normal heart sounds. No murmur heard. No friction rub. No gallop.   Pulmonary:     Effort: Pulmonary effort is normal. No respiratory distress.     Breath sounds: Normal breath sounds. No stridor. No wheezing, rhonchi or rales.  Chest:     Chest wall: No tenderness.  Skin:    General: Skin is warm and dry.  Neurological:     General: No focal deficit present.     Mental  Status: She is alert and oriented to person, place, and time. Mental status is at baseline.  Psychiatric:        Mood and Affect: Mood normal.        Behavior: Behavior normal.        Thought Content: Thought content normal.        Judgment: Judgment normal.     BP 110/75   Pulse 90   Temp 97.7 F (36.5 C) (Temporal)   Resp 18   Ht 5' (1.524 m)   Wt 163 lb 9.6 oz (74.2 kg)   SpO2 98%   BMI 31.95 kg/m  Wt Readings from Last 3 Encounters:  08/12/20 163 lb 9.6 oz (74.2 kg)  05/29/20 163 lb 8 oz (74.2 kg)  05/23/20 163 lb (73.9 kg)     Health Maintenance Due  Topic Date Due  . INFLUENZA VACCINE  03/09/2020  . COVID-19 Vaccine (3 - Booster for Pfizer series) 04/24/2020    There are no preventive care reminders to display for this patient.  Lab Results  Component Value Date   TSH 2.550 05/29/2020   Lab Results  Component Value Date   WBC 10.0 05/23/2020   HGB 12.9 05/23/2020   HCT 41.0 05/23/2020   MCV 85 05/23/2020   PLT 329 05/23/2020   Lab Results  Component Value Date   NA 142 05/23/2020   K 4.3 05/23/2020   CO2 25 05/23/2020   GLUCOSE 180 (H) 05/23/2020   BUN 15 05/23/2020   CREATININE 0.94 05/23/2020   BILITOT 0.9 05/23/2020   ALKPHOS 138 (H) 05/23/2020   AST 44 (H) 05/23/2020   ALT 59 (H) 05/23/2020   PROT 7.8 05/23/2020   ALBUMIN 4.4 05/23/2020   CALCIUM 9.6 05/23/2020   ANIONGAP 11 11/30/2015   Lab Results  Component Value Date   CHOL 187 05/23/2020   Lab Results   Component Value Date   HDL 57 05/23/2020   Lab Results  Component Value Date   LDLCALC 110 (H) 05/23/2020   Lab Results  Component Value Date   TRIG 111 05/23/2020   Lab Results  Component Value Date   CHOLHDL 3.3 05/23/2020   Lab Results  Component Value Date   HGBA1C 6.3 (A) 08/12/2020      Assessment & Plan:   Problem List Items Addressed This Visit   None   Visit Diagnoses    Flu vaccine need    -  Primary   Relevant Orders   Flu Vaccine QUAD 6+ mos PF IM (Fluarix Quad PF)   Type 2 diabetes mellitus with hyperglycemia, without long-term current use of insulin (HCC)       Relevant Orders   POCT glycosylated hemoglobin (Hb A1C) (Completed)      No orders of the defined types were placed in this encounter.   Follow-up: No follow-ups on file.   PLAN  Reasonable to start metformin 531m PO qd for post prandial hyperglycemia, preferred over glipizide or fast acting insulin due to lesser potential for hypoglycemia. Encourage close carb counting during meals to limit sugar spike  Return in 6 mo, sooner with concerns  Patient encouraged to call clinic with any questions, comments, or concerns.  RMaximiano Coss NP

## 2020-09-19 ENCOUNTER — Other Ambulatory Visit: Payer: Self-pay | Admitting: Registered Nurse

## 2020-09-19 DIAGNOSIS — E782 Mixed hyperlipidemia: Secondary | ICD-10-CM

## 2020-10-15 ENCOUNTER — Other Ambulatory Visit: Payer: Self-pay | Admitting: Registered Nurse

## 2020-10-15 DIAGNOSIS — G894 Chronic pain syndrome: Secondary | ICD-10-CM

## 2020-10-15 NOTE — Telephone Encounter (Signed)
Requested medications are due for refill today.  yes  Requested medications are on the active medications list.  yes  Last refill. 06/02/2020  Future visit scheduled.   No  Notes to clinic.  Not delegated.

## 2020-10-16 NOTE — Telephone Encounter (Signed)
Patient is requesting a refill of the following medications: Requested Prescriptions   Pending Prescriptions Disp Refills   traMADol (ULTRAM) 50 MG tablet [Pharmacy Med Name: TRAMADOL HCL 50 MG TABLET] 30 tablet 0    Sig: TAKE 1 TABLET BY MOUTH EVERY 6 HOURS AS NEEDED.    Date of patient request: 10/15/20 Last office visit:08/12/20 Date of last refill: 06/02/20 Last refill amount: 30 Follow up time period per chart:

## 2020-12-01 ENCOUNTER — Ambulatory Visit (INDEPENDENT_AMBULATORY_CARE_PROVIDER_SITE_OTHER): Payer: Medicare Other | Admitting: Neurology

## 2020-12-01 ENCOUNTER — Encounter: Payer: Self-pay | Admitting: Neurology

## 2020-12-01 VITALS — BP 127/76 | HR 88 | Ht 60.0 in | Wt 158.0 lb

## 2020-12-01 DIAGNOSIS — G3101 Pick's disease: Secondary | ICD-10-CM | POA: Diagnosis not present

## 2020-12-01 DIAGNOSIS — F0280 Dementia in other diseases classified elsewhere without behavioral disturbance: Secondary | ICD-10-CM | POA: Diagnosis not present

## 2020-12-01 NOTE — Patient Instructions (Addendum)
Referral to speech therapy Continue Aricept  Follow up in 6 months

## 2020-12-01 NOTE — Progress Notes (Addendum)
Chief Complaint  Patient presents with  . Follow-up    New rm, alone, pt states she is stable     [place], seen in request by  I reviewed and summarized the referring note.  Past medical history Obstructive sleep apnea, could not tolerate CPAP machine Migraine headaches Depression  I reviewed most recent office visit in February 2020 by [facility]ist Dr. Manuella Ghazi, [place].  I reviewed, patient was given diagnosis of primary progressive aphasia, donepezil 10 mg daily   From reviewing the chart, patient reported her symptoms started gradually around April 2016 and progressively getting worse, she noted speech problem after root canal, she said she has trouble pronouncing words, spelling words and remembering words, she states the worst in her hand will not, all, she does not feel like her writing has changed, she is unsure if her joint has changed, she had no problem working with her hands such as cooking cleaning and using instrument.  She feels as if she could not spell like what she could before  While reading she has to really slowly even 1 obturating of mild, she put on the subtitles 1 watching TV, but has difficulty keeping up with subtitles.  She denies visual or auditory hallucinations, she denies difficulty swallowing,  She feels like exhausted her to talk, she feels as takes a lot of energy to talk, she has tried and failed speech therapy,  EEG reported normal February 2019, MRI of the brain 2019, mild periventricular, subcortical and pontine small vessel disease  She was given the diagnosis of primary progressive aphasia  Meghan Hickman is a 70 years old right-handed female, alone at today's clinical visit, seen in refer by  her primary care physician Dr. Ivar Bury in July 08 2015 for evaluation of speech difficulty  She had a history of hypertension, hyperlipidemia, diabetes, depression, was recently started on Depakote 500 mg daily  She  began to noticed gradual onset slurred speech since April 2016, initially attributed to her dental work, even after it was fixed, she continue have progressive slow talking, struggle with words, intermittent slurring, she denies chewing swallowing difficulty, she denies visual loss, she work as a Astronomer, no limb muscle weakness,  She recently found to have enlarged thyroid, needle biopsy is planned, ultrasound of carotid artery was patent,  We have Reviewed CAT scan in October 2016, periventricular white matter disease, no acute lesions  UPDATE Jul 24 2015: She still struggle with words, mild slur,word finding difficulty, mild confusion. MRI of the brain reviewed, mild small vessel disease, more on left hemisphere  She has no acute lesions  UPDATE May 15th 2017:  She suffered sinus infection in early May, is treated with antibiotics, she had a temporary upper denture, complains of falling object sensation, mildly slurred speech, but overall is getting better, denies swallowing difficulty, she denies limb muscle weakness  I have reviewed laboratory evaluation, elevated CBC in Dec 18 2014 16.2,  Personally reviewed MRI of the brain with patients again, supratentorium small vessel disease, especially at the deep white matter, she also complains of worsening depression, bilateral ear tinnitus,  UPDATE Sep 15 2017: Her speech is getting worse, the words would not come out, she has no trouble swallowing, no comprehensive problem. She has no double vision.   She has mild memory loss.   She has no family history of memory loss.  She has no weakness.  She still works at post office, Agricultural engineer.  She complains of depression for many years, she lives alone.  She goes out with her friends occasionally.  She also complains of vibratory sensation in her head, snoring, frequent awakening, excessive daytime fatigue and sleepiness  UPDATE May 25 2018: She was [facility] at [facility] in July 2019  by Dr. Manuella Ghazi, was diagnosed with primary progressive aphasia, no trouble understanding, but has word finding difficulty, gradually getting worse.  She still works at post [employer], lives alone, denies significant memory loss  UPDATE May 29 2020: She  was diagnosed with obstructive sleep apnea, using CPAP machine, but stopped using it because she could not tolerate the facial mask,  She was followed by [facility] neurology, most recent visit in February 2020, was diagnosed with primary progressive aphasia, started on Aricept  Today she came in with a list of questions, denies swallowing difficulty, still lives alone, using device to help her pronounce, seems to have no comprehensive difficulty, when she does talk, with deliberate effort,she was able to write without much difficulty  Update December 01, 2020 SS: No longer at post office for 3 years, lives alone, drives a car. Managing things at home well, no trouble chewing or swallowing. Continued trouble getting words out, deliberate effort, using electronic device to type her thoughts, are then projected audibly, somewhat slow with tablet use. Not using CPAP any longer didn't like mask.  Writes in broken sentences, phrases. Feels struggle to remember words to get out. Going on since 2016  B12, RPR, HIV, folate, TSH, ANA were normal in October 2021.  MRI of the brain in November 2021 showed age-related changes, no acute abnormalities.  Seeing new PCP this week. MMSE 25/30 today.   REVIEW OF SYSTEMS: Full 14 system review of systems performed and notable only for as above  See HPI  ALLERGIES: No Known Allergies  HOME MEDICATIONS: Current Outpatient Medications  Medication Sig Dispense Refill  . albuterol (PROVENTIL HFA;VENTOLIN HFA) 108 (90 Base) MCG/ACT inhaler Inhale 2 puffs into the lungs every 6 (six) hours as needed for wheezing or shortness of breath. 1 Inhaler 0  . almotriptan (AXERT) 12.5 MG tablet Take 12.5 mg by mouth daily  as needed for migraine.   4  . ALPRAZolam (XANAX) 1 MG tablet Take 1-2 tablets thirty minutes prior to MRI.  May repeat with 1 additional tablet prior to entering scanner, if needed.  Must have driver. 3 tablet 0  . aspirin 81 MG tablet Take 81 mg by mouth daily.    [name] Kitchen atorvastatin (LIPITOR) 20 MG tablet TAKE 1 TABLET BY MOUTH EVERY DAY 90 tablet 0  . beclomethasone (QVAR REDIHALER) 80 MCG/ACT inhaler Inhale 1 puff into the lungs 2 (two) times daily. 1 each 11  . blood glucose meter kit and supplies Dispense based on patient and insurance preference. Use up to three times daily as directed. (FOR ICD-10 E11.65). Dispense 100 lancets and test strips and 1 lancing device to match meter. 1 each 0  . donepezil (ARICEPT) 10 MG tablet Take 1 tablet (10 mg total) by mouth at bedtime. 90 tablet 4  . escitalopram (LEXAPRO) 20 MG tablet TAKE 1 TABLET BY MOUTH EVERY DAY 90 tablet 1  . famotidine (PEPCID) 40 MG tablet Take 1 tablet (40 mg total) by mouth daily. 90 tablet 3  . gabapentin (NEURONTIN) 300 MG capsule TAKE 1 CAPSULE BY MOUTH THREE TIMES A DAY 270 capsule 0  . ipratropium (ATROVENT) 0.03 % nasal spray Place 2 sprays into both nostrils 2 (two)  times daily. 30 mL 0  . losartan-hydrochlorothiazide (HYZAAR) 50-12.5 MG tablet TAKE 1/2 TABLET BY MOUTH EVERY DAY 45 tablet 0  . metFORMIN (GLUCOPHAGE) 500 MG tablet Take 1 tablet (500 mg total) by mouth daily with breakfast. 180 tablet 0  . Multiple Vitamins-Minerals (MULTIVITAMIN & MINERAL PO) Take 1 tablet by mouth daily.    Glory Rosebush ULTRA test strip USE UP TO 3 TIMES A DAY AS DIRECTED 100 strip 2  . temazepam (RESTORIL) 30 MG capsule TAKE 1 CAPSULE BY MOUTH AT BEDTIME AS NEEDED FOR SLEEP 30 capsule 2  . traMADol (ULTRAM) 50 MG tablet TAKE 1 TABLET BY MOUTH EVERY 6 HOURS AS NEEDED. 30 tablet 0  . traZODone (DESYREL) 50 MG tablet TAKE 1/2 TO 1 TABLET BY MOUTH AT BEDTIME AS NEEDED FOR SLEEP 90 tablet 2   No current facility-administered medications for this  visit.    PAST MEDICAL HISTORY: Past Medical History:  Diagnosis Date  . Arthritis   . Depression   . Diabetes mellitus without complication ([place])   . Diverticulitis   . GERD (gastroesophageal reflux disease)   . Headache   . Heart murmur   . Hyperlipidemia   . Hypertension   . Speech problem   . Wears glasses     PAST SURGICAL HISTORY: Past Surgical History:  Procedure Laterality Date  . CHOLECYSTECTOMY N/A 12/05/2014   Procedure: LAPAROSCOPIC CHOLECYSTECTOMY;  Surgeon: Rolm Bookbinder, MD;  Location: [place];  Service: General;  Laterality: N/A;  . SHOULDER ARTHROSCOPY WITH SUBACROMIAL DECOMPRESSION AND BICEP TENDON REPAIR Right 04/23/2013   Procedure: SHOULDER ARTHROSCOPY WITH SUBACROMIAL DECOMPRESSION AND BICEP TENDON TENOTOMY;  Surgeon: Nita Sells, MD;  Location: [place];  Service: Orthopedics;  Laterality: Right;    FAMILY HISTORY: Family History  Problem Relation Age of Onset  . Stroke Mother   . Hypertension Mother   . Diabetes Mother   . Breast cancer Mother   . Healthy Father   . Heart disease Brother     SOCIAL HISTORY: Social History   Socioeconomic History  . Marital status: Single    Spouse name: Not on file  . Number of children: 0  . Years of education: 44  . Highest education level: Not on file  Occupational History  . Occupation: Scientist, research (medical)  Tobacco Use  . Smoking status: Former Smoker    Packs/day: 0.50    Years: 20.00    Pack years: 10.00    Types: Cigarettes    Start date: 04/30/2012    Quit date: 08/16/2012    Years since quitting: 8.2  . Smokeless tobacco: Never Used  Vaping Use  . Vaping Use: Never used  Substance and Sexual Activity  . Alcohol use: No    Alcohol/week: 0.0 standard drinks  . Drug use: No  . Sexual activity: Never    Comment: was smoking 1/2 pppd  Other Topics Concern  . Not on file  Social History Narrative   Lives at home alone.   Right-handed.   2 cups caffeine per day.    Social Determinants of Health   Financial Resource Strain: Not on file  Food Insecurity: Not on file  Transportation Needs: Not on file  Physical Activity: Not on file  Stress: Not on file  Social Connections: Not on file  Intimate Partner Violence: Not on file   PHYSICAL EXAM   Vitals:   12/01/20 1421  BP: 127/76  Pulse: 88  Weight: 158 lb (71.7 kg)  Height: 5' (  1.524 m)   Not recorded     Body mass index is 30.86 kg/m.  PHYSICAL EXAMNIATION: MMSE - Mini Mental State Exam 12/01/2020 07/18/2019  Not completed: (No Data) -  Orientation to time 5 5  Orientation to Place 5 5  Registration 2 3  Attention/ Calculation 2 5  Attention/Calculation-comments world  "drolw" -  Recall 3 3  Language- name 2 objects 2 2  Language- repeat 0 0  Language- repeat-comments ifs ands buts -  Language- follow 3 step command 3 3  Language- read & follow direction 1 1  Write a sentence 1 1  Write a sentence-comments i like rainy days couple misss spellings  Copy design 1 1  Copy design-comments - named 9 animals  Total score 25 29   Gen: NAD, expressive aphasia, well nourised, well groomed                     Cardiovascular: Regular rate rhythm, no peripheral edema, warm, nontender. Eyes: Conjunctivae clear without exudates or hemorrhage Pulmonary: Clear to auscultation bilaterally   NEUROLOGICAL EXAM:  MENTAL STATUS: Speech/cognition: Normal comprehension, able to pronounce with deliberate effort, using electronic device, somewhat slowly   CRANIAL NERVES: CN II: Visual fields are full to confrontation. Pupils are round equal and briskly reactive to light. CN III, IV, VI: extraocular movement are normal. No ptosis. CN V: Facial sensation is intact to light touch CN VII: Face is symmetric with normal eye closure  CN VIII: Hearing is normal to causal conversation. CN IX, X: Phonation is normal. CN XI: Head turning and shoulder shrug are intact  MOTOR: There is no pronator  drift of out-stretched arms. Muscle bulk and tone are normal. Muscle strength is normal.  REFLEXES: Reflexes are 2+ throughout  SENSORY: Intact to light touch COORDINATION: There is no trunk or limb dysmetria noted.  GAIT/STANCE: Gait is normal   DIAGNOSTIC DATA (LABS, IMAGING, TESTING) - I reviewed patient records, labs, notes, testing and imaging myself where available.  ASSESSMENT AND PLAN  Meghan Hickman is a 70 y.o. female   1. Language difficulty  -Unsure etiology, patient reported continued progression of her symptoms  -She was diagnosed with primary progressive aphasia, but her speech pattern, well-preserved cognitive function, lack of significant progress by examination despite her proclaimed worsening symptom argues against a true central nervous system degenerative disorder; continues to live alone, apparently manage her affairs, MMSE 25/30 today   -MRI of the brain in November 2021 showed age-related changes, no acute abnormalities  -B12, RPR, HIV, folate, TSH, ANA were normal in October 2021  -Continue Aricept 10 mg daily, no longer going to [facility], not wearing CPAP  -Referral to speech therapy, will continue to follow overtime  -Follow-up in 12 months or sooner if needed with Dr. Krista Blue   I spent 30 minutes of face-to-face and non-face-to-face time with patient.  This included previsit chart review, lab review, study review, order entry, electronic health record documentation, patient education.  Evangeline Dakin, DNP  [facility] [address] [place], [place] [address] [phone]   Reviewed above treatment plan, will see patient on a yearly basis,

## 2020-12-02 ENCOUNTER — Encounter: Payer: Self-pay | Admitting: Internal Medicine

## 2020-12-02 ENCOUNTER — Ambulatory Visit (INDEPENDENT_AMBULATORY_CARE_PROVIDER_SITE_OTHER): Payer: Medicare Other | Admitting: Internal Medicine

## 2020-12-02 ENCOUNTER — Other Ambulatory Visit: Payer: Self-pay

## 2020-12-02 VITALS — BP 136/86 | HR 92 | Temp 98.6°F | Resp 16 | Ht 59.0 in | Wt 157.6 lb

## 2020-12-02 DIAGNOSIS — I10 Essential (primary) hypertension: Secondary | ICD-10-CM | POA: Diagnosis not present

## 2020-12-02 DIAGNOSIS — E782 Mixed hyperlipidemia: Secondary | ICD-10-CM

## 2020-12-02 DIAGNOSIS — K7581 Nonalcoholic steatohepatitis (NASH): Secondary | ICD-10-CM | POA: Insufficient documentation

## 2020-12-02 DIAGNOSIS — R7989 Other specified abnormal findings of blood chemistry: Secondary | ICD-10-CM | POA: Diagnosis not present

## 2020-12-02 DIAGNOSIS — E118 Type 2 diabetes mellitus with unspecified complications: Secondary | ICD-10-CM | POA: Diagnosis not present

## 2020-12-02 DIAGNOSIS — E785 Hyperlipidemia, unspecified: Secondary | ICD-10-CM

## 2020-12-02 NOTE — Progress Notes (Signed)
Subjective:  Patient ID: Meghan Hickman, female    DOB: [birth year]  Age: 70 y.o. MRN: [id number]  CC: Hypertension and Diabetes  This visit occurred during the SARS-CoV-2 public health emergency.  Safety protocols were in place, including screening questions prior to the visit, additional usage of staff PPE, and extensive cleaning of exam room while observing appropriate contact time as indicated for disinfecting solutions.    HPI Meghan Hickman presents for f/up and to establish.  She has aphasia but informs me by writing that she is doing well and needs a new PCP.  She has a history of hypertension but based on prescription refills she is no longer taking the thiazide diuretic/ARB combination.  Outpatient Medications Prior to Visit  Medication Sig Dispense Refill  . almotriptan (AXERT) 12.5 MG tablet Take 12.5 mg by mouth daily as needed for migraine.   4  . ALPRAZolam (XANAX) 1 MG tablet Take 1-2 tablets thirty minutes prior to MRI.  May repeat with 1 additional tablet prior to entering scanner, if needed.  Must have driver. 3 tablet 0  . aspirin 81 MG tablet Take 81 mg by mouth daily.    . beclomethasone (QVAR REDIHALER) 80 MCG/ACT inhaler Inhale 1 puff into the lungs 2 (two) times daily. 1 each 11  . blood glucose meter kit and supplies Dispense based on patient and insurance preference. Use up to three times daily as directed. (FOR ICD-10 E11.65). Dispense 100 lancets and test strips and 1 lancing device to match meter. 1 each 0  . donepezil (ARICEPT) 10 MG tablet Take 1 tablet (10 mg total) by mouth at bedtime. 90 tablet 4  . escitalopram (LEXAPRO) 20 MG tablet TAKE 1 TABLET BY MOUTH EVERY DAY 90 tablet 1  . famotidine (PEPCID) 40 MG tablet Take 1 tablet (40 mg total) by mouth daily. 90 tablet 3  . gabapentin (NEURONTIN) 300 MG capsule TAKE 1 CAPSULE BY MOUTH THREE TIMES A DAY 270 capsule 0  . ipratropium (ATROVENT) 0.03 % nasal spray Place 2 sprays into both nostrils 2 (two)  times daily. 30 mL 0  . losartan-hydrochlorothiazide (HYZAAR) 50-12.5 MG tablet TAKE 1/2 TABLET BY MOUTH EVERY DAY 45 tablet 0  . metFORMIN (GLUCOPHAGE) 500 MG tablet Take 1 tablet (500 mg total) by mouth daily with breakfast. 180 tablet 0  . Multiple Vitamins-Minerals (MULTIVITAMIN & MINERAL PO) Take 1 tablet by mouth daily.    Glory Rosebush ULTRA test strip USE UP TO 3 TIMES A DAY AS DIRECTED 100 strip 2  . temazepam (RESTORIL) 30 MG capsule TAKE 1 CAPSULE BY MOUTH AT BEDTIME AS NEEDED FOR SLEEP 30 capsule 2  . traMADol (ULTRAM) 50 MG tablet TAKE 1 TABLET BY MOUTH EVERY 6 HOURS AS NEEDED. 30 tablet 0  . traZODone (DESYREL) 50 MG tablet TAKE 1/2 TO 1 TABLET BY MOUTH AT BEDTIME AS NEEDED FOR SLEEP 90 tablet 2  . albuterol (PROVENTIL HFA;VENTOLIN HFA) 108 (90 Base) MCG/ACT inhaler Inhale 2 puffs into the lungs every 6 (six) hours as needed for wheezing or shortness of breath. 1 Inhaler 0  . atorvastatin (LIPITOR) 20 MG tablet TAKE 1 TABLET BY MOUTH EVERY DAY 90 tablet 0   No facility-administered medications prior to visit.    ROS Review of Systems  Constitutional: Negative for appetite change, diaphoresis and fatigue.  HENT: Negative.   Eyes: Negative for visual disturbance.  Respiratory: Negative for cough, chest tightness and shortness of breath.   Cardiovascular: Negative for chest pain, palpitations  and leg swelling.  Gastrointestinal: Negative for abdominal pain, diarrhea and nausea.  Endocrine: Negative.   Genitourinary: Negative.   Musculoskeletal: Negative.   Skin: Negative.   Neurological: Negative.  Negative for dizziness, weakness and light-headedness.  Hematological: Negative for adenopathy. Does not bruise/bleed easily.  Psychiatric/Behavioral: Negative.     Objective:  BP 136/86 (BP Location: Left Arm, Patient Position: Sitting, Cuff Size: Large)   Pulse 92   Temp 98.6 F (37 C) (Oral)   Resp 16   Ht _0  (1.499 m)   Wt 157 lb 9.6 oz (71.5 kg)   SpO2 97%   BMI  31.83 kg/m   BP Readings from Last 3 Encounters:  12/02/20 136/86  12/01/20 127/76  08/12/20 110/75    Wt Readings from Last 3 Encounters:  12/02/20 157 lb 9.6 oz (71.5 kg)  12/01/20 158 lb (71.7 kg)  08/12/20 163 lb 9.6 oz (74.2 kg)    Physical Exam Vitals reviewed.  Constitutional:      Appearance: Normal appearance.  HENT:     Nose: Nose normal.     Mouth/Throat:     Mouth: Mucous membranes are moist.  Eyes:     General: No scleral icterus.    Conjunctiva/sclera: Conjunctivae normal.  Cardiovascular:     Rate and Rhythm: Normal rate and regular rhythm.     Pulses: Normal pulses.     Heart sounds: No murmur heard.   Pulmonary:     Effort: Pulmonary effort is normal.     Breath sounds: No stridor. No wheezing, rhonchi or rales.  Abdominal:     General: Abdomen is protuberant. Bowel sounds are normal. There is no distension.     Palpations: Abdomen is soft. There is no hepatomegaly, splenomegaly or mass.     Tenderness: There is no abdominal tenderness.  Musculoskeletal:        General: Normal range of motion.     Cervical back: Neck supple.     Right lower leg: No edema.     Left lower leg: No edema.  Lymphadenopathy:     Cervical: No cervical adenopathy.  Skin:    General: Skin is warm and dry.  Neurological:     General: No focal deficit present.     Mental Status: She is alert.  Psychiatric:        Mood and Affect: Mood normal.        Behavior: Behavior normal.     Lab Results  Component Value Date   WBC 8.9 12/03/2020   HGB 12.6 12/03/2020   HCT 38.1 12/03/2020   PLT 390.0 12/03/2020   GLUCOSE 107 (H) 12/03/2020   CHOL 187 05/23/2020   TRIG 111 05/23/2020   HDL 57 05/23/2020   LDLCALC 110 (H) 05/23/2020   ALT 17 12/03/2020   AST 19 12/03/2020   NA 142 12/03/2020   K 3.9 12/03/2020   CL 105 12/03/2020   CREATININE 0.89 12/03/2020   BUN 11 12/03/2020   CO2 29 12/03/2020   TSH 2.550 05/29/2020   INR 1.0 12/03/2020   HGBA1C 5.6 12/03/2020    MICROALBUR 0.9 04/24/2015    MR BRAIN WO CONTRAST  Addendum Date: 06/12/2020   ADDENDUM: -Compared to MRI on 07/23/2015, mild progression of chronic small vessel ischemic disease. Meghan Bombard, MD 06/12/2020, 4:55 PM   Result Date: 06/12/2020 [facility] NEUROIMAGING REPORT STUDY DATE: 06/11/20 PATIENT NAME: Meghan Hickman DOB: 1950/11/08 MRN: [id number] ORDERING CLINICIAN: Marcial Pacas, MD CLINICAL HISTORY: 70 year  old female with aphasia. EXAM: MR BRAIN WO CONTRAST TECHNIQUE: MRI of the brain without contrast was obtained utilizing 5 mm axial slices with T1, T2, T2 flair, SWI and diffusion weighted views.  T1 sagittal and T2 coronal views were obtained. CONTRAST: none COMPARISON: none IMAGING SITE: [facility] [address] (1.5 Tesla MRI)  FINDINGS: No abnormal lesions are seen on diffusion-weighted views to suggest acute ischemia. The cortical sulci, fissures and cisterns are normal in size and appearance. Lateral, third and fourth ventricle are normal in size and appearance. No extra-axial fluid collections are seen. No evidence of mass effect or midline shift. Mild periventricular and 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 On sagittal views the posterior fossa, pituitary gland and corpus callosum are unremarkable. No evidence of intracranial hemorrhage on SWI views. The orbits and their contents, paranasal sinuses and calvarium are unremarkable.  Intracranial flow voids are present.   MRI brain (without) demonstrating: - Mild periventricular and 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 - No acute findings. INTERPRETING PHYSICIAN: Meghan Bombard, MD Certified in Neurology, Neurophysiology and Neuroimaging [facility] [address], [place], [place] [address] [phone]    Assessment & Plan:   Signe was seen today for hypertension and diabetes.  Diagnoses and all orders for this  visit:  Elevated LFTs- Her liver enzymes are normal now. -     Hepatic function panel; Future -     Hepatic function panel  Type II diabetes mellitus with manifestations ([place])- Her A1c is down to 5.6%.  Will discontinue metformin. -     Basic metabolic panel; Future -     Hemoglobin A1c; Future -     Hemoglobin A1c -     Basic metabolic panel -     atorvastatin (LIPITOR) 20 MG tablet; TAKE 1 TABLET BY MOUTH EVERY DAY  Essential hypertension- Her blood pressure is adequately well controlled.  Medical therapy is not indicated. -     CBC with Differential/Platelet; Future -     Basic metabolic panel; Future -     Basic metabolic panel -     CBC with Differential/Platelet  NASH (nonalcoholic steatohepatitis)- Her liver enzymes are normal now. -     Protime-INR; Future -     Protime-INR  Hyperlipidemia with target LDL less than 130- I recommended that she take a statin for cardiovascular risk reduction. -     atorvastatin (LIPITOR) 20 MG tablet; TAKE 1 TABLET BY MOUTH EVERY DAY  Mixed hyperlipidemia   I have discontinued Connye Burkitt. Cardozo's albuterol. I am also having her maintain her aspirin, almotriptan, Multiple Vitamins-Minerals (MULTIVITAMIN & MINERAL PO), ipratropium, ALPRAZolam, gabapentin, blood glucose meter kit and supplies, famotidine, OneTouch Ultra, escitalopram, losartan-hydrochlorothiazide, Qvar RediHaler, donepezil, temazepam, traZODone, metFORMIN, traMADol, and atorvastatin.  Meds ordered this encounter  Medications  . atorvastatin (LIPITOR) 20 MG tablet    Sig: TAKE 1 TABLET BY MOUTH EVERY DAY    Dispense:  90 tablet    Refill:  1     Follow-up: Return in about 6 months (around 06/03/2021).  Scarlette Calico, MD

## 2020-12-02 NOTE — Patient Instructions (Signed)
Type 2 Diabetes Mellitus, Diagnosis, Adult Type 2 diabetes (type 2 diabetes mellitus) is a long-term, or chronic, disease. In type 2 diabetes, one or both of these problems may be present:  The pancreas does not make enough of a hormone called insulin.  Cells in the body do not respond properly to insulin that the body makes (insulin resistance). Normally, insulin allows blood sugar (glucose) to enter cells in the body. The cells use glucose for energy. Insulin resistance or lack of insulin causes excess glucose to build up in the blood instead of going into cells. This causes high blood glucose (hyperglycemia).  What are the causes? The exact cause of type 2 diabetes is not known. What increases the risk? The following factors may make you more likely to develop this condition:  Having a family member with type 2 diabetes.  Being overweight or obese.  Being inactive (sedentary).  Having been diagnosed with insulin resistance.  Having a history of prediabetes, diabetes when you were pregnant (gestational diabetes), or polycystic ovary syndrome (PCOS). What are the signs or symptoms? In the early stage of this condition, you may not have symptoms. Symptoms develop slowly and may include:  Increased thirst or hunger.  Increased urination.  Unexplained weight loss.  Tiredness (fatigue) or weakness.  Vision changes, such as blurry vision.  Dark patches on the skin. How is this diagnosed? This condition is diagnosed based on your symptoms, your medical history, a physical exam, and your blood glucose level. Your blood glucose may be checked with one or more of the following blood tests:  A fasting blood glucose (FBG) test. You will not be allowed to eat (you will fast) for 8 hours or longer before a blood sample is taken.  A random blood glucose test. This test checks blood glucose at any time of day regardless of when you ate.  An A1C (hemoglobin A1C) blood test. This test  provides information about blood glucose levels over the previous 2-3 months.  An oral glucose tolerance test (OGTT). This test measures your blood glucose at two times: ? After fasting. This is your baseline blood glucose level. ? Two hours after drinking a beverage that contains glucose. You may be diagnosed with type 2 diabetes if:  Your fasting blood glucose level is 126 mg/dL (7.0 mmol/L) or higher.  Your random blood glucose level is 200 mg/dL (11.1 mmol/L) or higher.  Your A1C level is 6.5% or higher.  Your oral glucose tolerance test result is higher than 200 mg/dL (11.1 mmol/L). These blood tests may be repeated to confirm your diagnosis.   How is this treated? Your treatment may be managed by a specialist called an endocrinologist. Type 2 diabetes may be treated by following instructions from your health care provider about:  Making dietary and lifestyle changes. These may include: ? Following a personalized nutrition plan that is developed by a registered dietitian. ? Exercising regularly. ? Finding ways to manage stress.  Checking your blood glucose level as often as told.  Taking diabetes medicines or insulin daily. This helps to keep your blood glucose levels in the healthy range.  Taking medicines to help prevent complications from diabetes. Medicines may include: ? Aspirin. ? Medicine to lower cholesterol. ? Medicine to control blood pressure. Your health care provider will set treatment goals for you. Your goals will be based on your age, other medical conditions you have, and how you respond to diabetes treatment. Generally, the goal of treatment is to maintain the   following blood glucose levels:  Before meals: 80-130 mg/dL (4.4-7.2 mmol/L).  After meals: below 180 mg/dL (10 mmol/L).  A1C level: less than 7%. Follow these instructions at home: Questions to ask your health care provider Consider asking the following questions:  Should I meet with a certified  diabetes care and education specialist?  What diabetes medicines do I need, and when should I take them?  What equipment will I need to manage my diabetes at home?  How often do I need to check my blood glucose?  Where can I find a support group for people with diabetes?  What number can I call if I have questions?  When is my next appointment? General instructions  Take over-the-counter and prescription medicines only as told by your health care provider.  Keep all follow-up visits as told by your health care provider. This is important. Where to find more information  American Diabetes Association (ADA): www.diabetes.org  American Association of Diabetes Care and Education Specialists (ADCES): www.diabeteseducator.org  International Diabetes Federation (IDF): www.idf.org Contact a health care provider if:  Your blood glucose is at or above 240 mg/dL (13.3 mmol/L) for 2 days in a row.  You have been sick or have had a fever for 2 days or longer, and you are not getting better.  You have any of the following problems for more than 6 hours: ? You cannot eat or drink. ? You have nausea and vomiting. ? You have diarrhea. Get help right away if:  You have severe hypoglycemia. This means your blood glucose is lower than 54 mg/dL (3.0 mmol/L).  You become confused or you have trouble thinking clearly.  You have difficulty breathing.  You have moderate or large ketone levels in your urine. These symptoms may represent a serious problem that is an emergency. Do not wait to see if the symptoms will go away. Get medical help right away. Call your local emergency services (911 in the [place] Do not drive yourself to the hospital. Summary  Type 2 diabetes (type 2 diabetes mellitus) is a long-term, or chronic, disease. In type 2 diabetes, the pancreas does not make enough of a hormone called insulin, or cells in the body do not respond properly to insulin that the body makes (insulin  resistance).  This condition is treated by making dietary and lifestyle changes and taking diabetes medicines or insulin.  Your health care provider will set treatment goals for you. Your goals will be based on your age, other medical conditions you have, and how you respond to diabetes treatment.  Keep all follow-up visits as told by your health care provider. This is important. This information is not intended to replace advice given to you by your health care provider. Make sure you discuss any questions you have with your health care provider. Document Revised: 02/20/2020 Document Reviewed: 02/20/2020 Elsevier Patient Education  2021 Elsevier Inc.  

## 2020-12-03 LAB — PROTIME-INR
INR: 1 ratio (ref 0.8–1.0)
Prothrombin Time: 11.6 s (ref 9.6–13.1)

## 2020-12-03 LAB — CBC WITH DIFFERENTIAL/PLATELET
Basophils Absolute: 0.1 10*3/uL (ref 0.0–0.1)
Basophils Relative: 0.6 % (ref 0.0–3.0)
Eosinophils Absolute: 0.1 10*3/uL (ref 0.0–0.7)
Eosinophils Relative: 1.5 % (ref 0.0–5.0)
HCT: 38.1 % (ref 36.0–46.0)
Hemoglobin: 12.6 g/dL (ref 12.0–15.0)
Lymphocytes Relative: 31.7 % (ref 12.0–46.0)
Lymphs Abs: 2.8 10*3/uL (ref 0.7–4.0)
MCHC: 33 g/dL (ref 30.0–36.0)
MCV: 82.2 fl (ref 78.0–100.0)
Monocytes Absolute: 0.5 10*3/uL (ref 0.1–1.0)
Monocytes Relative: 5.4 % (ref 3.0–12.0)
Neutro Abs: 5.4 10*3/uL (ref 1.4–7.7)
Neutrophils Relative %: 60.8 % (ref 43.0–77.0)
Platelets: 390 10*3/uL (ref 150.0–400.0)
RBC: 4.63 Mil/uL (ref 3.87–5.11)
RDW: 14.5 % (ref 11.5–15.5)
WBC: 8.9 10*3/uL (ref 4.0–10.5)

## 2020-12-03 LAB — HEPATIC FUNCTION PANEL
ALT: 17 U/L (ref 0–35)
AST: 19 U/L (ref 0–37)
Albumin: 4.2 g/dL (ref 3.5–5.2)
Alkaline Phosphatase: 79 U/L (ref 39–117)
Bilirubin, Direct: 0.1 mg/dL (ref 0.0–0.3)
Total Bilirubin: 1.1 mg/dL (ref 0.2–1.2)
Total Protein: 7.8 g/dL (ref 6.0–8.3)

## 2020-12-03 LAB — BASIC METABOLIC PANEL
BUN: 11 mg/dL (ref 6–23)
CO2: 29 mEq/L (ref 19–32)
Calcium: 9.5 mg/dL (ref 8.4–10.5)
Chloride: 105 mEq/L (ref 96–112)
Creatinine, Ser: 0.89 mg/dL (ref 0.40–1.20)
GFR: 65.52 mL/min (ref >60.00)
Glucose, Bld: 107 mg/dL — ABNORMAL HIGH (ref 70–99)
Potassium: 3.9 mEq/L (ref 3.5–5.1)
Sodium: 142 mEq/L (ref 135–145)

## 2020-12-03 LAB — HEMOGLOBIN A1C: Hgb A1c MFr Bld: 5.6 % (ref 4.6–6.5)

## 2020-12-04 ENCOUNTER — Telehealth: Payer: Self-pay | Admitting: Neurology

## 2020-12-04 MED ORDER — ATORVASTATIN CALCIUM 20 MG PO TABS: ORAL_TABLET | ORAL | 1 refills | Status: DC

## 2020-12-04 NOTE — Telephone Encounter (Signed)
Please change follow-up with me in 1 year

## 2020-12-15 ENCOUNTER — Ambulatory Visit: Payer: Medicare Other | Attending: Neurology | Admitting: Speech Pathology

## 2021-01-01 NOTE — Telephone Encounter (Addendum)
Appt made

## 2021-01-19 ENCOUNTER — Other Ambulatory Visit: Payer: Self-pay

## 2021-01-19 ENCOUNTER — Encounter: Payer: Self-pay | Admitting: Speech Pathology

## 2021-01-19 ENCOUNTER — Ambulatory Visit: Payer: Medicare Other | Attending: Neurology | Admitting: Speech Pathology

## 2021-01-19 DIAGNOSIS — G3101 Pick's disease: Secondary | ICD-10-CM | POA: Diagnosis not present

## 2021-01-19 DIAGNOSIS — F0280 Dementia in other diseases classified elsewhere without behavioral disturbance: Secondary | ICD-10-CM | POA: Diagnosis present

## 2021-01-19 DIAGNOSIS — R482 Apraxia: Secondary | ICD-10-CM | POA: Insufficient documentation

## 2021-01-20 NOTE — Addendum Note (Signed)
Addended by: Dorena Bodo on: 01/20/2021 02:38 PM   Modules accepted: Orders

## 2021-01-20 NOTE — Therapy (Signed)
[facility] [address] [place], [place], [address] Phone: [phone]   Fax:  [phone]  Speech Language Pathology Evaluation  Patient Details  Name: Meghan Hickman MRN: [id number] Date of Birth: 1950/10/15 Referring Provider (SLP): Margie Ege NP   Encounter Date: 01/19/2021   End of Session - 01/20/21 1330     Visit Number 1    Number of Visits 8    Date for SLP Re-Evaluation 03/22/21    SLP Start Time 1530    SLP Stop Time  1630    SLP Time Calculation (min) 60 min    Activity Tolerance Patient tolerated treatment well             Past Medical History:  Diagnosis Date   Arthritis    Depression    Diabetes mellitus without complication (HCC)    Diverticulitis    GERD (gastroesophageal reflux disease)    Headache    Heart murmur    Hyperlipidemia    Hypertension    Speech problem    Wears glasses     Past Surgical History:  Procedure Laterality Date   CHOLECYSTECTOMY N/A 12/05/2014   Procedure: LAPAROSCOPIC CHOLECYSTECTOMY;  Surgeon: Emelia Loron, MD;  Location: [facility] OR;  Service: General;  Laterality: N/A;   SHOULDER ARTHROSCOPY WITH SUBACROMIAL DECOMPRESSION AND BICEP TENDON REPAIR Right 04/23/2013   Procedure: SHOULDER ARTHROSCOPY WITH SUBACROMIAL DECOMPRESSION AND BICEP TENDON TENOTOMY;  Surgeon: Mable Paris, MD;  Location: [facility];  Service: Orthopedics;  Laterality: Right;    There were no vitals filed for this visit.   Subjective Assessment - 01/20/21 1328     Subjective Pt was limited on verbal expression. Able to answers yes/no/okay verbally.    Currently in Pain? No/denies                SLP Evaluation [facility] - 01/19/21 1534       SLP Visit Information   SLP Received On 01/20/21    Referring Provider (SLP) Margie Ege NP    Onset Date sx 2016, 2019 dx of PPA    Medical Diagnosis PPA      Subjective   Patient/Family Stated Goal Pronouncing  words; speaking      General Information   HPI Pt is a 70 yo female w/ dx of PPA. Sx began in 2016 and officially diagnosed in 2019. Last MRI (2021) shows age-related changes; no acute abnormality.      Balance Screen   Has the patient fallen in the past 6 months No      Prior Functional Status   Type of Home House     Lives With Alone    Available Support Family;Friend(s)    Vocation Retired      IT consultant   Overall Cognitive Status Difficult to assess    Difficult to assess due to Impaired communication    Memory Impaired    Memory Impairment Other (comment)   Reported memory decline by patient     Auditory Comprehension   Overall Auditory Comprehension Appears within functional limits for tasks assessed      Reading Comprehension   Reading Status Within funtional limits      Expression   Primary Mode of Expression Augmentative device      Verbal Expression   Overall Verbal Expression Impaired    Initiation Impaired    Level of Generative/Spontaneous Verbalization Word    Repetition Impaired    Level of Impairment Word level  Naming Impairment    Interfering Components Speech intelligibility    Effective Techniques --   Closed ended questions   Non-Verbal Means of Communication Writing;Other (comment)   High-tech AAC     Oral Motor/Sensory Function   Overall Oral Motor/Sensory Function Impaired      Motor Speech   Articulation Impaired    Motor Speech Errors Groping for words;Aware      Standardized Assessments   Standardized Assessments  Other Assessment   WAB-B                            SLP Education - 01/20/21 1329     Education Details PPA    Person(s) Educated Patient    Methods Explanation;Handout    Comprehension Need further instruction              SLP Short Term Goals - 01/20/21 1404       SLP SHORT TERM GOAL #1   Title Pt will collaborate with ST to create written list of important items/phrases to input into AAC  device with minA.    Time 4    Period Weeks    Status New      SLP SHORT TERM GOAL #2   Title Pt will use SGD to converse with friends/family in session to make her needs/wants known with minA.    Time 4    Period Weeks    Status New      SLP SHORT TERM GOAL #3   Title Pt will succesfully transmit message to communication partner using gestures in 4/5 opportunities during a structured task.    Time 4    Period Weeks              SLP Long Term Goals - 01/20/21 1402       SLP LONG TERM GOAL #1   Title Pt will utilize multiple modalities (verbal, gestural, written etc) in addition to her high-tech AAC device to communicate wants/need effectively with friends and family.    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 This diagnosis was given in 2019. Last course of speech therapy was 06/19/19; per previous SLP, pt was able to participate in conversation w/ modified independence. This date, pt is able to verbally produce a few basic words (yes/no/okay/bye) and due to motor speech is unintelligible during spontaneous speech and repetitive word trials. SLP assessed pt using WAB-B to determine level of expressive/receptive language. Pt demonstrated nonfluent speech (1 word/utterance), with relatively intact auditory comprehension, and impaired repetition of single words. Pt appeared "defeated" after this assessment, writing "rest home?" and "fatal?" on the paper. SLP edu on the progression of PPA and spoke to patient about the importance of preparation for limited expressive language. SLP asked pt closed-ended questions as opposed to open ended questions throughout assessment. When asked, pt reported she has had some "thinking changes". SLP asked patient to bring the person she most likely may deem POA as PPA progresses so they can plan for next steps. Pt goal  was to work on pronunciation, SLP explained that the pronuniciation will likely not change through the course of PPA. Pt demonstrated understanding. SLP suggested pt and SLP work on adding information to  AAC device so she is able to continue to participate in discussions with friends and family. Pt was in agreement. SLP rec skilled speech services to address severe decline in expressive language.    Speech Therapy Frequency 1x /week    Duration 8 weeks    Treatment/Interventions SLP instruction and feedback;Internal/external aids;Environmental controls;Compensatory strategies;Patient/family education;Functional tasks;Language facilitation;Multimodal communcation approach;Cueing hierarchy    Potential to Achieve Goals Fair    Potential Considerations Medical prognosis    Consulted and Agree with Plan of Care Patient             Patient will benefit from skilled therapeutic intervention in order to improve the following deficits and impairments:   Primary progressive aphasia [facility])  Verbal apraxia    Problem List Patient Active Problem List   Diagnosis Date Noted   Elevated LFTs 12/02/2020   Type II diabetes mellitus with manifestations (HCC) 12/02/2020   NASH (nonalcoholic steatohepatitis) 12/02/2020   Hyperlipidemia with target LDL less than 130 12/02/2020   OSA on CPAP 05/29/2020   Primary progressive aphasia (HCC) 05/25/2018   Essential hypertension 10/13/2016   Moderate episode of recurrent major depressive disorder (HCC) 05/12/2016   Small vessel disease, cerebrovascular 12/22/2015   Heart murmur 02/11/2013   Type 2 diabetes mellitus, controlled (HCC) 02/09/2013    [name] [place] MS, [place], CBIS  01/20/2021, 2:31 PM  [facility] [address] [place], [place], [address] Phone: [phone]   Fax:  [phone]  Name: Meghan Hickman MRN: [id number] Date of Birth: 1950-11-26

## 2021-01-26 ENCOUNTER — Ambulatory Visit: Payer: Medicare Other | Admitting: Speech Pathology

## 2021-01-27 ENCOUNTER — Other Ambulatory Visit: Payer: Self-pay

## 2021-01-27 ENCOUNTER — Encounter: Payer: Self-pay | Admitting: Speech Pathology

## 2021-01-27 ENCOUNTER — Ambulatory Visit: Payer: Medicare Other | Admitting: Speech Pathology

## 2021-01-27 DIAGNOSIS — G3101 Pick's disease: Secondary | ICD-10-CM

## 2021-01-27 DIAGNOSIS — F0280 Dementia in other diseases classified elsewhere without behavioral disturbance: Secondary | ICD-10-CM

## 2021-01-27 NOTE — Therapy (Signed)
[facility] [place] [facility] [address] [place], [place], [address] Phone: [phone]   Fax:  [phone]  Speech Language Pathology Treatment  Patient Details  Name: Meghan Hickman MRN: [id number] Date of Birth: Aug 02, 1951 Referring Provider (SLP): Margie Ege NP   Encounter Date: 01/27/2021   End of Session - 01/27/21 1610     Visit Number 2    Number of Visits 8    Date for SLP Re-Evaluation 03/22/21    SLP Start Time 1530    SLP Stop Time  1615    SLP Time Calculation (min) 45 min    Activity Tolerance Patient tolerated treatment well             Past Medical History:  Diagnosis Date   Arthritis    Depression    Diabetes mellitus without complication (HCC)    Diverticulitis    GERD (gastroesophageal reflux disease)    Headache    Heart murmur    Hyperlipidemia    Hypertension    Speech problem    Wears glasses     Past Surgical History:  Procedure Laterality Date   CHOLECYSTECTOMY N/A 12/05/2014   Procedure: LAPAROSCOPIC CHOLECYSTECTOMY;  Surgeon: Emelia Loron, MD;  [facility];  Service: General;  Laterality: N/A;   SHOULDER ARTHROSCOPY WITH SUBACROMIAL DECOMPRESSION AND BICEP TENDON REPAIR Right 04/23/2013   Procedure: SHOULDER ARTHROSCOPY WITH SUBACROMIAL DECOMPRESSION AND BICEP TENDON TENOTOMY;  Surgeon: Mable Paris, MD;  Location: [facility];  Service: Orthopedics;  Laterality: Right;    There were no vitals filed for this visit.          ADULT SLP TREATMENT - 01/27/21 1620       General Information   Behavior/Cognition Alert;Cooperative;Pleasant mood      Treatment Provided   Treatment provided Cognitive-Linquistic      Cognitive-Linquistic Treatment   Treatment focused on Patient/family/caregiver education    Skilled Treatment Edu caregiver/friend and patient on PPA and discussed information for planning care moving foward with a neurodegenerative disease. Pt  in agreement with tx plan.      Assessment / Recommendations / Plan   Plan Continue with current plan of care      Progression Toward Goals   Progression toward goals Progressing toward goals                SLP Short Term Goals - 01/27/21 1619       SLP SHORT TERM GOAL #1   Title Pt will collaborate with ST to create written list of important items/phrases to input into AAC device with minA.    Time 4    Period Weeks    Status On-going      SLP SHORT TERM GOAL #2   Title Pt will use SGD to converse with friends/family in session to make her needs/wants known with minA.    Time 4    Period Weeks    Status On-going      SLP SHORT TERM GOAL #3   Title Pt will succesfully transmit message to communication partner using gestures in 4/5 opportunities during a structured task.    Time 4    Period Weeks    Status On-going              SLP Long Term Goals - 01/27/21 1620       SLP LONG TERM GOAL #1   Title Pt will utilize multiple modalities (verbal, gestural, written etc) in addition  to her high-tech AAC device to communicate wants/need effectively with friends and family.    Time 8    Period Weeks    Status On-going              Plan - 01/27/21 1618     Clinical Impression Statement Pt brought her closest friend Carlo today. SLP provided Carlo and pt more edu on PPA and provided info for social work. Provided info on POA/Living will via [facility] pamphlet.  SLP rec skilled speech services to address severe decline in expressive language.    Speech Therapy Frequency 1x /week    Duration 8 weeks    Treatment/Interventions SLP instruction and feedback;Internal/external aids;Environmental controls;Compensatory strategies;Patient/family education;Functional tasks;Language facilitation;Multimodal communcation approach;Cueing hierarchy    Potential to Achieve Goals Fair    Potential Considerations Medical prognosis    Consulted and Agree with Plan of Care Patient              Patient will benefit from skilled therapeutic intervention in order to improve the following deficits and impairments:   Primary progressive aphasia [facility])    Problem List Patient Active Problem List   Diagnosis Date Noted   Elevated LFTs 12/02/2020   Type II diabetes mellitus with manifestations (HCC) 12/02/2020   NASH (nonalcoholic steatohepatitis) 12/02/2020   Hyperlipidemia with target LDL less than 130 12/02/2020   OSA on CPAP 05/29/2020   Primary progressive aphasia (HCC) 05/25/2018   Essential hypertension 10/13/2016   Moderate episode of recurrent major depressive disorder (HCC) 05/12/2016   Small vessel disease, cerebrovascular 12/22/2015   Heart murmur 02/11/2013   Type 2 diabetes mellitus, controlled (HCC) 02/09/2013    [name] [place] MS, [place], CBIS  01/27/2021, 4:23 PM  [facility] [place] [facility] [address] [place], [place], [address] Phone: [phone]   Fax:  [phone]   Name: Meghan Hickman MRN: [id number] Date of Birth: 24-Sep-1950

## 2021-02-04 ENCOUNTER — Other Ambulatory Visit: Payer: Self-pay

## 2021-02-04 ENCOUNTER — Ambulatory Visit: Payer: Medicare Other | Admitting: Speech Pathology

## 2021-02-04 ENCOUNTER — Encounter: Payer: Self-pay | Admitting: Speech Pathology

## 2021-02-04 DIAGNOSIS — G3101 Pick's disease: Secondary | ICD-10-CM | POA: Diagnosis not present

## 2021-02-04 DIAGNOSIS — F0280 Dementia in other diseases classified elsewhere without behavioral disturbance: Secondary | ICD-10-CM

## 2021-02-04 DIAGNOSIS — R482 Apraxia: Secondary | ICD-10-CM

## 2021-02-05 NOTE — Therapy (Signed)
[facility] [place] [facility] [address] [place], [place], [address] Phone: [phone]   Fax:  [phone]  Speech Language Pathology Treatment  Patient Details  Name: Meghan Hickman MRN: [id number] Date of Birth: 1951/07/11 Referring Provider (SLP): Margie Ege NP   Encounter Date: 02/04/2021   End of Session - 02/05/21 1632     Visit Number 3    Number of Visits 8    Date for SLP Re-Evaluation 03/22/21    SLP Start Time 1550    SLP Stop Time  1645    SLP Time Calculation (min) 55 min    Activity Tolerance Patient tolerated treatment well             Past Medical History:  Diagnosis Date   Arthritis    Depression    Diabetes mellitus without complication (HCC)    Diverticulitis    GERD (gastroesophageal reflux disease)    Headache    Heart murmur    Hyperlipidemia    Hypertension    Speech problem    Wears glasses     Past Surgical History:  Procedure Laterality Date   CHOLECYSTECTOMY N/A 12/05/2014   Procedure: LAPAROSCOPIC CHOLECYSTECTOMY;  Surgeon: Emelia Loron, MD;  [facility];  Service: General;  Laterality: N/A;   SHOULDER ARTHROSCOPY WITH SUBACROMIAL DECOMPRESSION AND BICEP TENDON REPAIR Right 04/23/2013   Procedure: SHOULDER ARTHROSCOPY WITH SUBACROMIAL DECOMPRESSION AND BICEP TENDON TENOTOMY;  Surgeon: Mable Paris, MD;  Location: [facility];  Service: Orthopedics;  Laterality: Right;    There were no vitals filed for this visit.   Subjective Assessment - 02/04/21 1551     Subjective Pt friend reported she tried to get ahold of the social work office.    Currently in Pain? No/denies                   ADULT SLP TREATMENT - 02/05/21 1628       General Information   Behavior/Cognition Alert;Cooperative;Pleasant mood      Treatment Provided   Treatment provided Cognitive-Linquistic      Cognitive-Linquistic Treatment   Treatment focused on  Patient/family/caregiver education;Other (comment)   AAC   Skilled Treatment Developed a plan to organize Meghan Hickman's AAC device to increase accessibility of icons during communication. Provided edu to friend, Meghan Hickman, on how to navigate device. Created icons for medical history and emergency contacts. HEP to brainstorm phrases that display her personality to input into device. Pt used written expression in her notebook as means for her communication today.      Assessment / Recommendations / Plan   Plan Continue with current plan of care      Progression Toward Goals   Progression toward goals Progressing toward goals                SLP Short Term Goals - 02/05/21 1635       SLP SHORT TERM GOAL #1   Title Pt will collaborate with ST to create written list of important items/phrases to input into AAC device with minA.    Time 3    Period Weeks    Status On-going      SLP SHORT TERM GOAL #2   Title Pt will use SGD to converse with friends/family in session to make her needs/wants known with minA.    Time 3    Period Weeks    Status On-going      SLP SHORT TERM GOAL #3  Title Pt will succesfully transmit message to communication partner using gestures in 4/5 opportunities during a structured task.    Time 3    Period Weeks    Status On-going              SLP Long Term Goals - 02/05/21 1636       SLP LONG TERM GOAL #1   Title Pt will utilize multiple modalities (verbal, gestural, written etc) in addition to her high-tech AAC device to communicate wants/need effectively with friends and family.    Time 7    Period Weeks    Status On-going              Plan - 02/05/21 1634     Clinical Impression Statement Meghan Hickman and Meghan Hickman reported they had attempted to contact [facility] but had not received any communication. They reported they will contact their attourney for assistance with living will/ POA. SLP spoke with pt about organizing AAC device to increase  accessibility of communication during conversation (ie. creating folders).  SLP rec skilled speech services to address severe decline in expressive language.    Speech Therapy Frequency 1x /week    Duration 8 weeks    Treatment/Interventions SLP instruction and feedback;Internal/external aids;Environmental controls;Compensatory strategies;Patient/family education;Functional tasks;Language facilitation;Multimodal communcation approach;Cueing hierarchy    Potential to Achieve Goals Fair    Potential Considerations Medical prognosis    Consulted and Agree with Plan of Care Patient             Patient will benefit from skilled therapeutic intervention in order to improve the following deficits and impairments:   Primary progressive aphasia [facility])  Verbal apraxia    Problem List Patient Active Problem List   Diagnosis Date Noted   Elevated LFTs 12/02/2020   Type II diabetes mellitus with manifestations (HCC) 12/02/2020   NASH (nonalcoholic steatohepatitis) 12/02/2020   Hyperlipidemia with target LDL less than 130 12/02/2020   OSA on CPAP 05/29/2020   Primary progressive aphasia (HCC) 05/25/2018   Essential hypertension 10/13/2016   Moderate episode of recurrent major depressive disorder (HCC) 05/12/2016   Small vessel disease, cerebrovascular 12/22/2015   Heart murmur 02/11/2013   Type 2 diabetes mellitus, controlled (HCC) 02/09/2013    [name] [place] MS, [place], CBIS  02/05/2021, 4:42 PM  [facility] [place] [facility] [address] [place], [place], [address] Phone: [phone]   Fax:  [phone]   Name: Meghan Hickman MRN: [id number] Date of Birth: 09-25-1950

## 2021-02-06 ENCOUNTER — Other Ambulatory Visit: Payer: Self-pay | Admitting: Registered Nurse

## 2021-02-06 DIAGNOSIS — E1165 Type 2 diabetes mellitus with hyperglycemia: Secondary | ICD-10-CM

## 2021-02-11 ENCOUNTER — Encounter: Payer: Self-pay | Admitting: Speech Pathology

## 2021-02-11 ENCOUNTER — Other Ambulatory Visit: Payer: Self-pay

## 2021-02-11 ENCOUNTER — Ambulatory Visit: Payer: Medicare Other | Attending: Neurology | Admitting: Speech Pathology

## 2021-02-11 DIAGNOSIS — F0280 Dementia in other diseases classified elsewhere without behavioral disturbance: Secondary | ICD-10-CM | POA: Diagnosis present

## 2021-02-11 DIAGNOSIS — G3101 Pick's disease: Secondary | ICD-10-CM | POA: Diagnosis present

## 2021-02-11 DIAGNOSIS — R482 Apraxia: Secondary | ICD-10-CM | POA: Insufficient documentation

## 2021-02-11 NOTE — Therapy (Signed)
[facility] [place] [facility] [address] [place], [place], [address] Phone: [phone]   Fax:  [phone]  Speech Language Pathology Treatment  Patient Details  Name: Meghan Hickman MRN: [id number] Date of Birth: [birth year] Referring Provider (SLP): Margie Ege NP   Encounter Date: 02/11/2021   End of Session - 02/11/21 1631     Visit Number 4    Number of Visits 8    Date for SLP Re-Evaluation 03/22/21    SLP Start Time 1530    SLP Stop Time  1620    SLP Time Calculation (min) 50 min    Activity Tolerance Patient tolerated treatment well             Past Medical History:  Diagnosis Date   Arthritis    Depression    Diabetes mellitus without complication (HCC)    Diverticulitis    GERD (gastroesophageal reflux disease)    Headache    Heart murmur    Hyperlipidemia    Hypertension    Speech problem    Wears glasses     Past Surgical History:  Procedure Laterality Date   CHOLECYSTECTOMY N/A 12/05/2014   Procedure: LAPAROSCOPIC CHOLECYSTECTOMY;  Surgeon: Emelia Loron, MD;  [facility];  Service: General;  Laterality: N/A;   SHOULDER ARTHROSCOPY WITH SUBACROMIAL DECOMPRESSION AND BICEP TENDON REPAIR Right 04/23/2013   Procedure: SHOULDER ARTHROSCOPY WITH SUBACROMIAL DECOMPRESSION AND BICEP TENDON TENOTOMY;  Surgeon: Mable Paris, MD;  Location: [facility];  Service: Orthopedics;  Laterality: Right;    There were no vitals filed for this visit.   Subjective Assessment - 02/11/21 1631     Subjective Pt and friend report that they are working on friend being healthcare POA.    Patient is accompained by: --   Friend, Carlo   Currently in Pain? No/denies                   ADULT SLP TREATMENT - 02/11/21 1633       General Information   Behavior/Cognition Alert;Cooperative;Pleasant mood      Treatment Provided   Treatment provided Cognitive-Linquistic       Cognitive-Linquistic Treatment   Treatment focused on Patient/family/caregiver education;Other (comment)    Skilled Treatment Developed list of important communicative intents to store on AAC device. Discussed more personally-specific vocabulary to add to device. SLP assisted pt with scheduling appt by modeling use of device at window. Edu pt on use of note's function and text-to-speech on phone so that she could use at drive-thru if person is unable to hear her AAC device.      Assessment / Recommendations / Plan   Plan Continue with current plan of care      Progression Toward Goals   Progression toward goals Progressing toward goals                SLP Short Term Goals - 02/11/21 1633       SLP SHORT TERM GOAL #1   Title Pt will collaborate with ST to create written list of important items/phrases to input into AAC device with minA.    Time 3    Period Weeks    Status On-going      SLP SHORT TERM GOAL #2   Title Pt will use SGD to converse with friends/family in session to make her needs/wants known with minA.    Time 3    Period Weeks    Status On-going  SLP SHORT TERM GOAL #3   Title Pt will succesfully transmit message to communication partner using gestures in 4/5 opportunities during a structured task.    Time 3    Period Weeks    Status On-going              SLP Long Term Goals - 02/11/21 1635       SLP LONG TERM GOAL #1   Title Pt will utilize multiple modalities (verbal, gestural, written etc) in addition to her high-tech AAC device to communicate wants/need effectively with friends and family.    Time 7    Period Weeks    Status On-going              Plan - 02/11/21 1632     Clinical Impression Statement Meghan Hickman, and SLP developed list of important communicative intents to store on AAC device from a given list of 100. Pt to finish last 20 w/ help from Carlo.  SLP rec skilled speech services to address severe decline in expressive  language.    Speech Therapy Frequency 1x /week    Duration 8 weeks    Treatment/Interventions SLP instruction and feedback;Internal/external aids;Environmental controls;Compensatory strategies;Patient/family education;Functional tasks;Language facilitation;Multimodal communcation approach;Cueing hierarchy    Potential to Achieve Goals Fair    Potential Considerations Medical prognosis    Consulted and Agree with Plan of Care Patient             Patient will benefit from skilled therapeutic intervention in order to improve the following deficits and impairments:   Primary progressive aphasia [facility])  Verbal apraxia    Problem List Patient Active Problem List   Diagnosis Date Noted   Elevated LFTs 12/02/2020   Type II diabetes mellitus with manifestations (HCC) 12/02/2020   NASH (nonalcoholic steatohepatitis) 12/02/2020   Hyperlipidemia with target LDL less than 130 12/02/2020   OSA on CPAP 05/29/2020   Primary progressive aphasia (HCC) 05/25/2018   Essential hypertension 10/13/2016   Moderate episode of recurrent major depressive disorder (HCC) 05/12/2016   Small vessel disease, cerebrovascular 12/22/2015   Heart murmur 02/11/2013   Type 2 diabetes mellitus, controlled (HCC) 02/09/2013    [name] [place] MS, [place], CBIS  02/11/2021, 4:36 PM  [facility] [place] [address] [place], [place], [address] Phone: [phone]   Fax:  [phone]   Name: Meghan Hickman MRN: [id number] Date of Birth: 1950-09-25

## 2021-02-18 ENCOUNTER — Encounter: Payer: Self-pay | Admitting: Speech Pathology

## 2021-02-18 ENCOUNTER — Other Ambulatory Visit: Payer: Self-pay

## 2021-02-18 ENCOUNTER — Ambulatory Visit: Payer: Medicare Other | Admitting: Speech Pathology

## 2021-02-18 DIAGNOSIS — R482 Apraxia: Secondary | ICD-10-CM

## 2021-02-18 DIAGNOSIS — G3101 Pick's disease: Secondary | ICD-10-CM | POA: Diagnosis not present

## 2021-02-18 DIAGNOSIS — F0280 Dementia in other diseases classified elsewhere without behavioral disturbance: Secondary | ICD-10-CM

## 2021-02-19 ENCOUNTER — Encounter: Payer: Self-pay | Admitting: Speech Pathology

## 2021-02-19 NOTE — Therapy (Signed)
[facility] [place] [facility] [address] [place], [place], [address] Phone: [phone]   Fax:  [phone]  Speech Language Pathology Treatment  Patient Details  Name: Meghan Hickman MRN: [id number] Date of Birth: 1950-09-06 Referring Provider (SLP): Margie Ege NP   Encounter Date: 02/18/2021   End of Session - 02/19/21 0927     Visit Number 5    Number of Visits 8    Date for SLP Re-Evaluation 03/22/21    SLP Start Time 1530    SLP Stop Time  1630    SLP Time Calculation (min) 60 min    Activity Tolerance Patient tolerated treatment well             Past Medical History:  Diagnosis Date   Arthritis    Depression    Diabetes mellitus without complication (HCC)    Diverticulitis    GERD (gastroesophageal reflux disease)    Headache    Heart murmur    Hyperlipidemia    Hypertension    Speech problem    Wears glasses     Past Surgical History:  Procedure Laterality Date   CHOLECYSTECTOMY N/A 12/05/2014   Procedure: LAPAROSCOPIC CHOLECYSTECTOMY;  Surgeon: Emelia Loron, MD;  [facility];  Service: General;  Laterality: N/A;   SHOULDER ARTHROSCOPY WITH SUBACROMIAL DECOMPRESSION AND BICEP TENDON REPAIR Right 04/23/2013   Procedure: SHOULDER ARTHROSCOPY WITH SUBACROMIAL DECOMPRESSION AND BICEP TENDON TENOTOMY;  Surgeon: Mable Paris, MD;  Location: [facility];  Service: Orthopedics;  Laterality: Right;    There were no vitals filed for this visit.   Subjective Assessment - 02/19/21 0924     Subjective Reports no new concerns this afternoon.    Patient is accompained by: --   Carlo, friend   Currently in Pain? No/denies                   ADULT SLP TREATMENT - 02/19/21 0926       General Information   Behavior/Cognition Alert;Cooperative;Pleasant mood      Treatment Provided   Treatment provided Cognitive-Linquistic      Cognitive-Linquistic Treatment   Treatment focused  on Aphasia;Apraxia    Skilled Treatment Programmed and modified AAC device to include more personally-specific icons. Pt was able to write what she wanted to input into the device.      Assessment / Recommendations / Plan   Plan Continue with current plan of care      Progression Toward Goals   Progression toward goals Progressing toward goals                SLP Short Term Goals - 02/19/21 0929       SLP SHORT TERM GOAL #1   Title Pt will collaborate with ST to create written list of important items/phrases to input into AAC device with minA.    Time 2    Period Weeks    Status On-going      SLP SHORT TERM GOAL #2   Title Pt will use SGD to converse with friends/family in session to make her needs/wants known with minA.    Time 2    Period Weeks    Status On-going      SLP SHORT TERM GOAL #3   Title Pt will succesfully transmit message to communication partner using gestures in 4/5 opportunities during a structured task.    Time 2    Period Weeks    Status On-going  SLP Long Term Goals - 02/19/21 0930       SLP LONG TERM GOAL #1   Title Pt will utilize multiple modalities (verbal, gestural, written etc) in addition to her high-tech AAC device to communicate wants/need effectively with friends and family.    Time 6    Period Weeks    Status On-going              Plan - 02/19/21 9381     Clinical Impression Statement Pt was able to complete list of preferred communicative intents. SLP assisted pt in organizing AAC device to include more important communication. Pt's preferred modality of communication continues to be writing.  SLP rec skilled speech services to address severe decline in expressive language.    Speech Therapy Frequency 1x /week    Duration 8 weeks    Treatment/Interventions SLP instruction and feedback;Internal/external aids;Environmental controls;Compensatory strategies;Patient/family education;Functional tasks;Language  facilitation;Multimodal communcation approach;Cueing hierarchy    Potential to Achieve Goals Fair    Potential Considerations Medical prognosis    Consulted and Agree with Plan of Care Patient             Patient will benefit from skilled therapeutic intervention in order to improve the following deficits and impairments:   Primary progressive aphasia [facility])  Verbal apraxia    Problem List Patient Active Problem List   Diagnosis Date Noted   Elevated LFTs 12/02/2020   Type II diabetes mellitus with manifestations (HCC) 12/02/2020   NASH (nonalcoholic steatohepatitis) 12/02/2020   Hyperlipidemia with target LDL less than 130 12/02/2020   OSA on CPAP 05/29/2020   Primary progressive aphasia (HCC) 05/25/2018   Essential hypertension 10/13/2016   Moderate episode of recurrent major depressive disorder (HCC) 05/12/2016   Small vessel disease, cerebrovascular 12/22/2015   Heart murmur 02/11/2013   Type 2 diabetes mellitus, controlled (HCC) 02/09/2013    [name] [place] MS, [place], CBIS  02/19/2021, 9:39 AM  [facility] [place] [facility] [address] [place], [place], [address] Phone: [phone]   Fax:  [phone]   Name: Meghan Hickman MRN: [id number] Date of Birth: January 25, 1951

## 2021-02-24 ENCOUNTER — Ambulatory Visit: Payer: Medicare Other | Admitting: Speech Pathology

## 2021-02-24 ENCOUNTER — Encounter: Payer: Self-pay | Admitting: Speech Pathology

## 2021-02-24 ENCOUNTER — Other Ambulatory Visit: Payer: Self-pay

## 2021-02-24 DIAGNOSIS — G3101 Pick's disease: Secondary | ICD-10-CM | POA: Diagnosis not present

## 2021-02-24 DIAGNOSIS — F0280 Dementia in other diseases classified elsewhere without behavioral disturbance: Secondary | ICD-10-CM

## 2021-02-24 DIAGNOSIS — R482 Apraxia: Secondary | ICD-10-CM

## 2021-02-24 NOTE — Therapy (Signed)
[facility] [address] [place], [place], [address] Phone: [phone]   Fax:  [phone]  Speech Language Pathology Treatment  Patient Details  Name: Meghan Hickman MRN: [id number] Date of Birth: 26-Aug-1950 Referring Provider (SLP): Margie Ege NP   Encounter Date: 02/24/2021   End of Session - 02/24/21 1627     Visit Number 6    Number of Visits 8    Date for SLP Re-Evaluation 03/22/21    SLP Start Time 1530    SLP Stop Time  1630    SLP Time Calculation (min) 60 min    Activity Tolerance Patient tolerated treatment well             Past Medical History:  Diagnosis Date   Arthritis    Depression    Diabetes mellitus without complication (HCC)    Diverticulitis    GERD (gastroesophageal reflux disease)    Headache    Heart murmur    Hyperlipidemia    Hypertension    Speech problem    Wears glasses     Past Surgical History:  Procedure Laterality Date   CHOLECYSTECTOMY N/A 12/05/2014   Procedure: LAPAROSCOPIC CHOLECYSTECTOMY;  Surgeon: Emelia Loron, MD;  [facility];  Service: General;  Laterality: N/A;   SHOULDER ARTHROSCOPY WITH SUBACROMIAL DECOMPRESSION AND BICEP TENDON REPAIR Right 04/23/2013   Procedure: SHOULDER ARTHROSCOPY WITH SUBACROMIAL DECOMPRESSION AND BICEP TENDON TENOTOMY;  Surgeon: Mable Paris, MD;  Location: [facility];  Service: Orthopedics;  Laterality: Right;    There were no vitals filed for this visit.   Subjective Assessment - 02/24/21 1535     Subjective Pt brought a notebook page full of comments/questions to ask SLP. Pt and friend reported they spoke with the neurologist.    Currently in Pain? No/denies                   ADULT SLP TREATMENT - 02/24/21 1556       General Information   Behavior/Cognition Alert;Cooperative;Pleasant mood      Treatment Provided   Treatment provided Cognitive-Linquistic      Cognitive-Linquistic  Treatment   Treatment focused on Aphasia;Apraxia    Skilled Treatment Edu and demonstrated to pt how to enable  text to speech acessibility feature on new iphone. Programmed AAC device to include other pertinent information      Assessment / Recommendations / Plan   Plan Continue with current plan of care      Progression Toward Goals   Progression toward goals Progressing toward goals                SLP Short Term Goals - 02/24/21 1627       SLP SHORT TERM GOAL #1   Title Pt will collaborate with ST to create written list of important items/phrases to input into AAC device with minA.    Time 2    Period Weeks    Status On-going      SLP SHORT TERM GOAL #2   Title Pt will use SGD to converse with friends/family in session to make her needs/wants known with minA.    Time 2    Period Weeks    Status On-going      SLP SHORT TERM GOAL #3   Title Pt will succesfully transmit message to communication partner using gestures in 4/5 opportunities during a structured task.    Time 2    Period Weeks  Status On-going              SLP Long Term Goals - 02/24/21 1628       SLP LONG TERM GOAL #1   Title Pt will utilize multiple modalities (verbal, gestural, written etc) in addition to her high-tech AAC device to communicate wants/need effectively with friends and family.    Time 6    Period Weeks    Status On-going              Plan - 02/24/21 1627     Clinical Impression Statement Pt was able to complete list of preferred communicative intents. SLP assisted pt in organizing AAC device to include more important communication. Pt's preferred modality of communication continues to be writing.  SLP rec skilled speech services to address severe decline in expressive language.    Speech Therapy Frequency 1x /week    Duration 8 weeks    Treatment/Interventions SLP instruction and feedback;Internal/external aids;Environmental controls;Compensatory  strategies;Patient/family education;Functional tasks;Language facilitation;Multimodal communcation approach;Cueing hierarchy    Potential to Achieve Goals Fair    Potential Considerations Medical prognosis    Consulted and Agree with Plan of Care Patient             Patient will benefit from skilled therapeutic intervention in order to improve the following deficits and impairments:   Primary progressive aphasia [facility])  Verbal apraxia    Problem List Patient Active Problem List   Diagnosis Date Noted   Elevated LFTs 12/02/2020   Type II diabetes mellitus with manifestations (HCC) 12/02/2020   NASH (nonalcoholic steatohepatitis) 12/02/2020   Hyperlipidemia with target LDL less than 130 12/02/2020   OSA on CPAP 05/29/2020   Primary progressive aphasia (HCC) 05/25/2018   Essential hypertension 10/13/2016   Moderate episode of recurrent major depressive disorder (HCC) 05/12/2016   Small vessel disease, cerebrovascular 12/22/2015   Heart murmur 02/11/2013   Type 2 diabetes mellitus, controlled (HCC) 02/09/2013    [name] Matinecock MS, [place], CBIS  02/24/2021, 4:31 PM  [facility] [address] [place], [place], [address] Phone: [phone]   Fax:  [phone]   Name: [name] MRN: [id number] Date of Birth: 05-Jul-1951

## 2021-03-04 ENCOUNTER — Ambulatory Visit: Payer: Medicare Other | Admitting: Speech Pathology

## 2021-03-05 ENCOUNTER — Ambulatory Visit: Payer: Medicare Other | Admitting: Speech Pathology

## 2021-03-05 ENCOUNTER — Other Ambulatory Visit: Payer: Self-pay

## 2021-03-05 DIAGNOSIS — F0280 Dementia in other diseases classified elsewhere without behavioral disturbance: Secondary | ICD-10-CM

## 2021-03-05 DIAGNOSIS — G3101 Pick's disease: Secondary | ICD-10-CM

## 2021-03-05 DIAGNOSIS — R482 Apraxia: Secondary | ICD-10-CM

## 2021-03-05 NOTE — Therapy (Signed)
[facility] [facility] [address] [place], [place], [address] Phone: [phone]   Fax:  [phone]  Speech Language Pathology Treatment  Patient Details  Name: Meghan Hickman MRN: [id number] Date of Birth: March 04, 1951 Referring Provider (SLP): Margie Ege NP   Encounter Date: 03/05/2021   End of Session - 03/05/21 1712     Visit Number 7    Number of Visits 8    Date for SLP Re-Evaluation 03/22/21    SLP Start Time 1530    SLP Stop Time  1614    SLP Time Calculation (min) 44 min    Activity Tolerance Patient tolerated treatment well             Past Medical History:  Diagnosis Date   Arthritis    Depression    Diabetes mellitus without complication (HCC)    Diverticulitis    GERD (gastroesophageal reflux disease)    Headache    Heart murmur    Hyperlipidemia    Hypertension    Speech problem    Wears glasses     Past Surgical History:  Procedure Laterality Date   CHOLECYSTECTOMY N/A 12/05/2014   Procedure: LAPAROSCOPIC CHOLECYSTECTOMY;  Surgeon: Emelia Loron, MD;  [facility];  Service: General;  Laterality: N/A;   SHOULDER ARTHROSCOPY WITH SUBACROMIAL DECOMPRESSION AND BICEP TENDON REPAIR Right 04/23/2013   Procedure: SHOULDER ARTHROSCOPY WITH SUBACROMIAL DECOMPRESSION AND BICEP TENDON TENOTOMY;  Surgeon: Mable Paris, MD;  Location: [facility];  Service: Orthopedics;  Laterality: Right;    There were no vitals filed for this visit.          ADULT SLP TREATMENT - 03/05/21 1710       General Information   Behavior/Cognition Alert;Cooperative      Treatment Provided   Treatment provided Cognitive-Linquistic      Cognitive-Linquistic Treatment   Treatment focused on Aphasia;Apraxia    Skilled Treatment Downloaded app for patient to increase accessibility - Speech to Talk!. Pt demonstrated use independently - she was excited because she  had had concerns that her AAC  device could not be heard at the drive-thru. Updated device and edu on new app updates.      Assessment / Recommendations / Plan   Plan Continue with current plan of care      Progression Toward Goals   Progression toward goals Progressing toward goals                SLP Short Term Goals - 03/05/21 1713       SLP SHORT TERM GOAL #1   Title Pt will collaborate with ST to create written list of important items/phrases to input into AAC device with minA.    Time 1    Period Weeks    Status On-going      SLP SHORT TERM GOAL #2   Title Pt will use SGD to converse with friends/family in session to make her needs/wants known with minA.    Time 1    Period Weeks    Status On-going      SLP SHORT TERM GOAL #3   Title Pt will succesfully transmit message to communication partner using gestures in 4/5 opportunities during a structured task.    Time 1    Period Weeks    Status On-going              SLP Long Term Goals - 03/05/21 1713       SLP  LONG TERM GOAL #1   Title Pt will utilize multiple modalities (verbal, gestural, written etc) in addition to her high-tech AAC device to communicate wants/need effectively with friends and family.    Time 3   error: pt comes x1/week.   Period Weeks    Status On-going              Plan - 03/05/21 1713     Clinical Impression Statement See tx note. Pt is able to navigate device, but needs further edu on new update.   SLP rec skilled speech services to address severe decline in expressive language.    Speech Therapy Frequency 1x /week    Duration 8 weeks    Treatment/Interventions SLP instruction and feedback;Internal/external aids;Environmental controls;Compensatory strategies;Patient/family education;Functional tasks;Language facilitation;Multimodal communcation approach;Cueing hierarchy    Potential to Achieve Goals Fair    Potential Considerations Medical prognosis    Consulted and Agree with Plan of Care Patient              Patient will benefit from skilled therapeutic intervention in order to improve the following deficits and impairments:   Primary progressive aphasia [facility])  Verbal apraxia    Problem List Patient Active Problem List   Diagnosis Date Noted   Elevated LFTs 12/02/2020   Type II diabetes mellitus with manifestations (HCC) 12/02/2020   NASH (nonalcoholic steatohepatitis) 12/02/2020   Hyperlipidemia with target LDL less than 130 12/02/2020   OSA on CPAP 05/29/2020   Primary progressive aphasia (HCC) 05/25/2018   Essential hypertension 10/13/2016   Moderate episode of recurrent major depressive disorder (HCC) 05/12/2016   Small vessel disease, cerebrovascular 12/22/2015   Heart murmur 02/11/2013   Type 2 diabetes mellitus, controlled (HCC) 02/09/2013    [name] Biehle MS, [place], CBIS  03/05/2021, 5:15 PM  [facility] [address] [place], [place], [address] Phone: [phone]   Fax:  [phone]   Name: Meghan Hickman MRN: [id number] Date of Birth: [birth year]

## 2021-03-11 ENCOUNTER — Other Ambulatory Visit: Payer: Self-pay

## 2021-03-11 ENCOUNTER — Ambulatory Visit: Payer: Medicare Other | Attending: Neurology | Admitting: Speech Pathology

## 2021-03-11 ENCOUNTER — Encounter: Payer: Self-pay | Admitting: Speech Pathology

## 2021-03-11 DIAGNOSIS — R482 Apraxia: Secondary | ICD-10-CM | POA: Insufficient documentation

## 2021-03-11 DIAGNOSIS — F0280 Dementia in other diseases classified elsewhere without behavioral disturbance: Secondary | ICD-10-CM | POA: Insufficient documentation

## 2021-03-11 DIAGNOSIS — G3101 Pick's disease: Secondary | ICD-10-CM | POA: Diagnosis present

## 2021-03-12 NOTE — Therapy (Signed)
[place], [place], [address] Phone: [phone]   Fax:  [phone]  Speech Language Pathology Treatment  Patient Details  Name: EVONNA STOLTZ MRN: [id number] Date of Birth: [birth year] Referring Provider (SLP): Butler Denmark NP   Encounter Date: 03/11/2021   End of Session - 03/12/21 1433     Visit Number 8    Date for SLP Re-Evaluation 04/22/21    Authorization - Number of Visits 2    SLP Start Time 9767    SLP Stop Time  1620    SLP Time Calculation (min) 50 min    Activity Tolerance Patient tolerated treatment well             Past Medical History:  Diagnosis Date   Arthritis    Depression    Diabetes mellitus without complication ([place])    Diverticulitis    GERD (gastroesophageal reflux disease)    Headache    Heart murmur    Hyperlipidemia    Hypertension    Speech problem    Wears glasses     Past Surgical History:  Procedure Laterality Date   CHOLECYSTECTOMY N/A 12/05/2014   Procedure: LAPAROSCOPIC CHOLECYSTECTOMY;  Surgeon: Rolm Bookbinder, MD;  Location: [place];  Service: General;  Laterality: N/A;   SHOULDER ARTHROSCOPY WITH SUBACROMIAL DECOMPRESSION AND BICEP TENDON REPAIR Right 04/23/2013   Procedure: SHOULDER ARTHROSCOPY WITH SUBACROMIAL DECOMPRESSION AND BICEP TENDON TENOTOMY;  Surgeon: Nita Sells, MD;  Location: [place];  Service: Orthopedics;  Laterality: Right;    There were no vitals filed for this visit.   Subjective Assessment - 03/11/21 1537     Subjective Pt reported she had a headache.    Currently in Pain? Yes                   ADULT SLP TREATMENT - 03/12/21 0001       General Information   Behavior/Cognition Alert;Cooperative      Treatment Provided   Treatment provided Cognitive-Linquistic      Cognitive-Linquistic Treatment   Treatment focused on Aphasia;Apraxia;Patient/family/caregiver education     Skilled Treatment Provided darrah dredge with education regarding support groups and tips for communication partners. Also provided demonstration of how to look up counselors in the area. Pt reports significant increase in anxiety (pt was rocking most of session..this is a new behavior) regarding her diagnosis. Reports feeling "locked in" due to her lack of verbal communication. Pt made a comment about not being able to see a counselor because she "can't talk". SLP explained that the counselor will have ways to work around that so she is able to get the support she needs and that she always has her device AND a pencil/paper. Pt in agreement. Reports device is losing battery quickly and she said she attempted to download the "upgrade" but SLP noted it did not work.      Progression Toward Goals   Progression toward goals Progressing toward goals                SLP Short Term Goals - 03/12/21 1431       SLP SHORT TERM GOAL #1   Title Pt will collaborate with ST to create written list of important items/phrases to input into AAC device with minA.    Time 1    Period Weeks    Status Achieved      SLP SHORT TERM GOAL #2   Title Pt  will use SGD to converse with friends/family in session to make her needs/wants known with minA.    Time 1    Period Weeks    Status Achieved      SLP SHORT TERM GOAL #3   Title Pt will succesfully transmit message to communication partner using gestures in 4/5 opportunities during a structured task.    Time 1    Period Weeks    Status Not Met   Ongoing             SLP Long Term Goals - 03/12/21 1432       SLP LONG TERM GOAL #1   Title Pt will utilize multiple modalities (verbal, gestural, written etc) in addition to her high-tech AAC device to communicate wants/need effectively with friends and family.    Time 3   error: pt comes x1/week.   Period Weeks    Status On-going              Plan - 03/12/21 1425     Clinical Impression  Statement See tx note. SLP reached out to [employer] representative to assist with new device issues. Pt to reach out to counselor and/or doctor regarding mental health. SLP to recertify for 1-2 more visits to ensure patient feels comfortable with d/c.  SLP rec skilled speech services to address severe decline in expressive language.    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[facility])  Verbal apraxia    Problem List Patient Active Problem List   Diagnosis Date Noted   Elevated LFTs 12/02/2020   Type II diabetes mellitus with manifestations ([place]) 12/02/2020   NASH (nonalcoholic steatohepatitis) 12/02/2020   Hyperlipidemia with target LDL less than 130 12/02/2020   OSA on CPAP 05/29/2020   Primary progressive aphasia ([place]) 05/25/2018   Essential hypertension 10/13/2016   Moderate episode of recurrent major depressive disorder ([place]) 05/12/2016   Small vessel disease, cerebrovascular 12/22/2015   Heart murmur 02/11/2013   Type 2 diabetes mellitus, controlled ([place]) 02/09/2013   Speech Therapy Recertification  Recertification date: 09/10/42  Subjective Statement: "You are helpful."  Objective: See past treatment notes.   No anxiety has been evident until this date. Pt reports difficulty coming to terms with diagnosis. SLP rec it may be beneficial for her to begin counseling or join a support group.   Goal Update: Progressing towards all goals.    Plan: See patient for 1-2 more sessions to ensure pt feels comfortable with d/c. SLP to continue to assist in problem solving device and preparing pt and caregivers for next steps in care.  Reason Skilled Services are Required: Skilled ST services are required to increase pt confidence with communication via AAC device.   Rosann Auerbach Roland MS, [place], CBIS  03/12/2021, 2:35 PM  [place]. [place], [place], [address] Phone: [phone]   Fax:  [phone]   Name: MAISON AGRUSA MRN: [id number] Date of Birth: 1951/07/16

## 2021-03-18 ENCOUNTER — Other Ambulatory Visit: Payer: Self-pay

## 2021-03-18 ENCOUNTER — Ambulatory Visit: Payer: Medicare Other | Admitting: Speech Pathology

## 2021-03-18 DIAGNOSIS — R482 Apraxia: Secondary | ICD-10-CM

## 2021-03-18 DIAGNOSIS — G3101 Pick's disease: Secondary | ICD-10-CM | POA: Diagnosis not present

## 2021-03-18 DIAGNOSIS — F0280 Dementia in other diseases classified elsewhere without behavioral disturbance: Secondary | ICD-10-CM

## 2021-03-18 NOTE — Therapy (Signed)
[facility] [place] [facility] [address] [place], [place], [address] Phone: [phone]   Fax:  [phone]  Speech Language Pathology Treatment  Patient Details  Name: Meghan Hickman MRN: [id number] Date of Birth: 10-06-1950 Referring Provider (SLP): Margie Ege NP   Encounter Date: 03/18/2021   End of Session - 03/18/21 1639     Visit Number 9    Date for SLP Re-Evaluation 04/22/21    Authorization - Visit Number 1    Authorization - Number of Visits 2    SLP Start Time 1530    SLP Stop Time  1625    SLP Time Calculation (min) 55 min    Activity Tolerance Patient tolerated treatment well             Past Medical History:  Diagnosis Date   Arthritis    Depression    Diabetes mellitus without complication (HCC)    Diverticulitis    GERD (gastroesophageal reflux disease)    Headache    Heart murmur    Hyperlipidemia    Hypertension    Speech problem    Wears glasses     Past Surgical History:  Procedure Laterality Date   CHOLECYSTECTOMY N/A 12/05/2014   Procedure: LAPAROSCOPIC CHOLECYSTECTOMY;  Surgeon: Emelia Loron, MD;  [facility];  Service: General;  Laterality: N/A;   SHOULDER ARTHROSCOPY WITH SUBACROMIAL DECOMPRESSION AND BICEP TENDON REPAIR Right 04/23/2013   Procedure: SHOULDER ARTHROSCOPY WITH SUBACROMIAL DECOMPRESSION AND BICEP TENDON TENOTOMY;  Surgeon: Mable Paris, MD;  Location: [facility];  Service: Orthopedics;  Laterality: Right;    There were no vitals filed for this visit.          ADULT SLP TREATMENT - 03/18/21 1637       General Information   Behavior/Cognition Alert;Cooperative      Treatment Provided   Treatment provided Cognitive-Linquistic      Cognitive-Linquistic Treatment   Treatment focused on Patient/family/caregiver education;Other (comment)    Skilled Treatment Troubleshooting AAC device. edu pt on how to schedule an appointment with  [employer]. SLP made appt for trouble shooting. SLP assisted pt in creating an appointment for psych services.      Assessment / Recommendations / Plan   Plan Continue with current plan of care      Progression Toward Goals   Progression toward goals Progressing toward goals                SLP Short Term Goals - 03/18/21 1641       SLP SHORT TERM GOAL #1   Title Pt will participate in [employer] technology meeting to troubleshoot device.    Time 1    Period Weeks    Status New      SLP SHORT TERM GOAL #3   Status --   Ongoing             SLP Long Term Goals - 03/18/21 1642       SLP LONG TERM GOAL #1   Title Pt will utilize multiple modalities (verbal, gestural, written etc) in addition to her high-tech AAC device to communicate wants/need effectively with friends and family.    Time 1   error: pt comes x1/week.   Period Weeks    Status On-going              Plan - 03/18/21 1640     Clinical Impression Statement Device is continuing to present problems. SLP and pt made an  appointment to speak with [employer] representative. rec skilled speech services to address severe decline in expressive language.    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[facility])  Verbal apraxia    Problem List Patient Active Problem List   Diagnosis Date Noted   Elevated LFTs 12/02/2020   Type II diabetes mellitus with manifestations  (HCC) 12/02/2020   NASH (nonalcoholic steatohepatitis) 12/02/2020   Hyperlipidemia with target LDL less than 130 12/02/2020   OSA on CPAP 05/29/2020   Primary progressive aphasia (HCC) 05/25/2018   Essential hypertension 10/13/2016   Moderate episode of recurrent major depressive disorder (HCC) 05/12/2016   Small vessel disease, cerebrovascular 12/22/2015   Heart murmur 02/11/2013   Type 2 diabetes mellitus, controlled (HCC) 02/09/2013    [name] [place] MS, [place], CBIS  03/18/2021, 4:43 PM  [facility] [place] [facility] [address] [place], [place], [address] Phone: [phone]   Fax:  [phone]   Name: Meghan Hickman MRN: [id number] Date of Birth: 29-Dec-1950

## 2021-03-19 ENCOUNTER — Other Ambulatory Visit: Payer: Self-pay | Admitting: Registered Nurse

## 2021-03-19 DIAGNOSIS — G2581 Restless legs syndrome: Secondary | ICD-10-CM

## 2021-03-19 DIAGNOSIS — G894 Chronic pain syndrome: Secondary | ICD-10-CM

## 2021-03-25 ENCOUNTER — Ambulatory Visit: Payer: Medicare Other | Admitting: Speech Pathology

## 2021-03-25 DIAGNOSIS — R482 Apraxia: Secondary | ICD-10-CM

## 2021-03-25 DIAGNOSIS — F0280 Dementia in other diseases classified elsewhere without behavioral disturbance: Secondary | ICD-10-CM

## 2021-03-25 DIAGNOSIS — G3101 Pick's disease: Secondary | ICD-10-CM | POA: Diagnosis not present

## 2021-03-26 NOTE — Therapy (Signed)
[facility] [address] [place], [place], [address] Phone: [phone]   Fax:  [phone]  Speech Language Pathology Treatment & Recertification  Patient Details  Name: Meghan Hickman MRN: [id number] Date of Birth: 1951-06-10 Referring Provider (SLP): Margie Ege NP   Encounter Date: 03/25/2021   End of Session - 03/26/21 0807     Visit Number 10    Date for SLP Re-Evaluation 04/26/21    Authorization - Number of Visits 4    SLP Start Time 1445    SLP Stop Time  1530    SLP Time Calculation (min) 45 min    Activity Tolerance Patient tolerated treatment well             Past Medical History:  Diagnosis Date   Arthritis    Depression    Diabetes mellitus without complication (HCC)    Diverticulitis    GERD (gastroesophageal reflux disease)    Headache    Heart murmur    Hyperlipidemia    Hypertension    Speech problem    Wears glasses     Past Surgical History:  Procedure Laterality Date   CHOLECYSTECTOMY N/A 12/05/2014   Procedure: LAPAROSCOPIC CHOLECYSTECTOMY;  Surgeon: Emelia Loron, MD;  Location: [facility] OR;  Service: General;  Laterality: N/A;   SHOULDER ARTHROSCOPY WITH SUBACROMIAL DECOMPRESSION AND BICEP TENDON REPAIR Right 04/23/2013   Procedure: SHOULDER ARTHROSCOPY WITH SUBACROMIAL DECOMPRESSION AND BICEP TENDON TENOTOMY;  Surgeon: Mable Paris, MD;  Location: [facility];  Service: Orthopedics;  Laterality: Right;    There were no vitals filed for this visit.          ADULT SLP TREATMENT - 03/26/21 0001       General Information   Behavior/Cognition Alert;Cooperative      Treatment Provided   Treatment provided Cognitive-Linquistic      Cognitive-Linquistic Treatment   Treatment focused on Patient/family/caregiver education;Other (comment);Aphasia    Skilled Treatment Facilitated expressive communcation during today's session using multiple modalities.  Consulted with [employer] tech support to troubleshoot device. [employer] Parents is to send loaner device to OP location. SLP to inform patient, so she can send her device in for repair. Pt wrote, "skip it" in response to assisting with developing plan for mental health management. SLP directed pt to ask assistance from doctor on referral for mental health services.      Assessment / Recommendations / Plan   Plan Continue with current plan of care      Progression Toward Goals   Progression toward goals Progressing toward goals                SLP Short Term Goals - 03/26/21 0813       SLP SHORT TERM GOAL #1   Title Pt will participate in [employer] technology meeting to troubleshoot device.    Time 1    Period Weeks    Status Achieved      SLP SHORT TERM GOAL #2   Title Pt will demonstrate use of upgraded device by navigating new buttons with minA.    Time 2    Period Weeks    Status New    Target Date 04/09/21      SLP SHORT TERM GOAL #3   Status --   Ongoing             SLP Long Term Goals - 03/26/21 0815       SLP LONG TERM GOAL #1  Title Pt will utilize multiple modalities (verbal, gestural, written etc) in addition to her high-tech AAC device to communicate wants/need effectively with friends and family.    Time 1   error: pt comes x1/week.   Period Weeks    Status Achieved      SLP LONG TERM GOAL #2   Title Pt (and friend, Carlo) will verbalize and demonstrate understanding of new device update/buttons independently.    Time 4    Period Weeks    Status New    Target Date 04/23/21              Plan - 03/26/21 0810     Clinical Impression Statement Device is continuing to present problems. Pt is able to navigate device without current updates; SLP to recert patient for additional 4 visits to assist with device management and train on new app update once repaired/replaced by [employer] representative. Pt and friend, Carlo, shared they had begun making  plans for long term placement (assisted living, etc.) if needed in the future.    Speech Therapy Frequency 1x /week    Duration 4 weeks    Treatment/Interventions SLP instruction and feedback;Internal/external aids;Environmental controls;Compensatory strategies;Patient/family education;Functional tasks;Language facilitation;Multimodal communcation approach;Cueing hierarchy    Potential to Achieve Goals Fair    Potential Considerations Medical prognosis    Consulted and Agree with Plan of Care Patient             Patient will benefit from skilled therapeutic intervention in order to improve the following deficits and impairments:   Primary progressive aphasia [facility])  Verbal apraxia    Problem List Patient Active Problem List   Diagnosis Date Noted   Elevated LFTs 12/02/2020   Type II diabetes mellitus with manifestations (HCC) 12/02/2020   NASH (nonalcoholic steatohepatitis) 12/02/2020   Hyperlipidemia with target LDL less than 130 12/02/2020   OSA on CPAP 05/29/2020   Primary progressive aphasia (HCC) 05/25/2018   Essential hypertension 10/13/2016   Moderate episode of recurrent major depressive disorder (HCC) 05/12/2016   Small vessel disease, cerebrovascular 12/22/2015   Heart murmur 02/11/2013   Type 2 diabetes mellitus, controlled (HCC) 02/09/2013   Speech Therapy Recertification     New Recertification Period: 03/26/21 to 04/23/21   Subjective Statement: Pt remains pleasant and cooperative. Reports some anxiety associated with diagnosis.   Objective Measurements: See tx notes.   Goal Update: Goals have been updated to reflect progress.   Plan: Continue w/ skilled ST services to assist with device management and train patient on new application update once repaired/replaced by [employer].   Reason Skilled Services are Required: SLP continues to rec skilled ST services to address expressive communication via AAC device.  [name] [place] MS, [place],  CBIS  03/26/2021, 8:18 AM  [facility] [address] [place], [place], [address] Phone: [phone]   Fax:  [phone]   Name: Meghan Hickman MRN: [id number] Date of Birth: July 22, 1951

## 2021-03-29 ENCOUNTER — Other Ambulatory Visit: Payer: Self-pay | Admitting: Internal Medicine

## 2021-03-29 ENCOUNTER — Other Ambulatory Visit: Payer: Self-pay | Admitting: Registered Nurse

## 2021-03-29 DIAGNOSIS — G2581 Restless legs syndrome: Secondary | ICD-10-CM

## 2021-03-29 DIAGNOSIS — E1165 Type 2 diabetes mellitus with hyperglycemia: Secondary | ICD-10-CM

## 2021-03-29 DIAGNOSIS — G894 Chronic pain syndrome: Secondary | ICD-10-CM

## 2021-03-29 DIAGNOSIS — E118 Type 2 diabetes mellitus with unspecified complications: Secondary | ICD-10-CM

## 2021-03-29 DIAGNOSIS — E785 Hyperlipidemia, unspecified: Secondary | ICD-10-CM

## 2021-03-30 ENCOUNTER — Other Ambulatory Visit: Payer: Self-pay

## 2021-03-30 ENCOUNTER — Ambulatory Visit (INDEPENDENT_AMBULATORY_CARE_PROVIDER_SITE_OTHER): Payer: Medicare Other | Admitting: Internal Medicine

## 2021-03-30 ENCOUNTER — Encounter: Payer: Self-pay | Admitting: Internal Medicine

## 2021-03-30 VITALS — BP 144/96 | HR 100 | Temp 98.7°F | Resp 16 | Ht 59.0 in | Wt 158.0 lb

## 2021-03-30 DIAGNOSIS — G2581 Restless legs syndrome: Secondary | ICD-10-CM

## 2021-03-30 DIAGNOSIS — I10 Essential (primary) hypertension: Secondary | ICD-10-CM

## 2021-03-30 DIAGNOSIS — G894 Chronic pain syndrome: Secondary | ICD-10-CM

## 2021-03-30 DIAGNOSIS — E041 Nontoxic single thyroid nodule: Secondary | ICD-10-CM

## 2021-03-30 MED ORDER — INDAPAMIDE 1.25 MG PO TABS: 1.2500 mg | ORAL_TABLET | Freq: Every day | ORAL | 0 refills | Status: DC

## 2021-03-30 MED ORDER — TRAMADOL HCL 50 MG PO TABS: 50.0000 mg | ORAL_TABLET | Freq: Four times a day (QID) | ORAL | 2 refills | Status: DC | PRN

## 2021-03-30 MED ORDER — TEMAZEPAM 30 MG PO CAPS: 30.0000 mg | ORAL_CAPSULE | Freq: Every evening | ORAL | 2 refills | Status: DC | PRN

## 2021-03-30 NOTE — Patient Instructions (Signed)

## 2021-03-30 NOTE — Progress Notes (Signed)
Subjective:  Patient ID: Meghan Hickman, female    DOB: October 14, 1950  Age: 70 y.o. MRN: [id number]  CC: Hypertension  This visit occurred during the SARS-CoV-2 public health emergency.  Safety protocols were in place, including screening questions prior to the visit, additional usage of staff PPE, and extensive cleaning of exam room while observing appropriate contact time as indicated for disinfecting solutions.    HPI Meghan Hickman presents for f/up -   She recently saw her neurologist and there was concern about a right thyroid nodule.  Based on her history it looks like she has had this in the past.  Its not painful or swollen.  She is concerned that her blood pressure is not well controlled and she has intermittent ankle swelling.  Outpatient Medications Prior to Visit  Medication Sig Dispense Refill   almotriptan (AXERT) 12.5 MG tablet Take 12.5 mg by mouth daily as needed for migraine.   4   aspirin 81 MG tablet Take 81 mg by mouth daily.     atorvastatin (LIPITOR) 20 MG tablet TAKE 1 TABLET BY MOUTH EVERY DAY 90 tablet 0   beclomethasone (QVAR REDIHALER) 80 MCG/ACT inhaler Inhale 1 puff into the lungs 2 (two) times daily. 1 each 11   blood glucose meter kit and supplies Dispense based on patient and insurance preference. Use up to three times daily as directed. (FOR ICD-10 E11.65). Dispense 100 lancets and test strips and 1 lancing device to match meter. 1 each 0   donepezil (ARICEPT) 10 MG tablet Take 1 tablet (10 mg total) by mouth at bedtime. 90 tablet 4   escitalopram (LEXAPRO) 20 MG tablet TAKE 1 TABLET BY MOUTH EVERY DAY 90 tablet 1   famotidine (PEPCID) 40 MG tablet Take 1 tablet (40 mg total) by mouth daily. 90 tablet 3   gabapentin (NEURONTIN) 300 MG capsule TAKE 1 CAPSULE BY MOUTH THREE TIMES A DAY 270 capsule 0   ipratropium (ATROVENT) 0.03 % nasal spray Place 2 sprays into both nostrils 2 (two) times daily. 30 mL 0   Multiple Vitamins-Minerals (MULTIVITAMIN & MINERAL  PO) Take 1 tablet by mouth daily.     ONETOUCH ULTRA test strip USE UP TO 3 TIMES A DAY AS DIRECTED 100 strip 2   traZODone (DESYREL) 50 MG tablet TAKE 1/2 TO 1 TABLET BY MOUTH AT BEDTIME AS NEEDED FOR SLEEP 90 tablet 2   ALPRAZolam (XANAX) 1 MG tablet Take 1-2 tablets thirty minutes prior to MRI.  May repeat with 1 additional tablet prior to entering scanner, if needed.  Must have driver. 3 tablet 0   temazepam (RESTORIL) 30 MG capsule TAKE 1 CAPSULE BY MOUTH AT BEDTIME AS NEEDED FOR SLEEP 30 capsule 2   traMADol (ULTRAM) 50 MG tablet TAKE 1 TABLET BY MOUTH EVERY 6 HOURS AS NEEDED. 30 tablet 0   No facility-administered medications prior to visit.    ROS Review of Systems  Constitutional: Negative.  Negative for diaphoresis and fatigue.  HENT: Negative.  Negative for sore throat, trouble swallowing and voice change.   Respiratory:  Negative for cough, shortness of breath and wheezing.   Cardiovascular:  Positive for leg swelling. Negative for chest pain and palpitations.  Gastrointestinal:  Negative for abdominal pain, constipation and diarrhea.  Endocrine: Negative for cold intolerance and heat intolerance.  Genitourinary: Negative.   Musculoskeletal:  Positive for arthralgias. Negative for back pain and myalgias.  Skin: Negative.   Neurological: Negative.  Negative for dizziness.  Hematological:  Negative for adenopathy. Does not bruise/bleed easily.  Psychiatric/Behavioral:  Positive for sleep disturbance. Negative for decreased concentration, dysphoric mood and suicidal ideas. The patient is not nervous/anxious.    Objective:  BP (!) 144/96 (BP Location: Right Arm, Patient Position: Sitting, Cuff Size: Large)   Pulse 100   Temp 98.7 F (37.1 C) (Oral)   Resp 16   Ht 4' 11"  (1.499 m)   Wt 158 lb (71.7 kg)   SpO2 96%   BMI 31.91 kg/m   BP Readings from Last 3 Encounters:  03/30/21 (!) 144/96  12/02/20 136/86  12/01/20 127/76    Wt Readings from Last 3 Encounters:   03/30/21 158 lb (71.7 kg)  12/02/20 157 lb 9.6 oz (71.5 kg)  12/01/20 158 lb (71.7 kg)    Physical Exam Vitals reviewed.  Constitutional:      Appearance: She is not ill-appearing.  HENT:     Nose: Nose normal.     Mouth/Throat:     Mouth: Mucous membranes are moist.  Eyes:     General: No scleral icterus.    Conjunctiva/sclera: Conjunctivae normal.  Neck:     Thyroid: Thyroid mass and thyromegaly present. No thyroid tenderness.   Cardiovascular:     Rate and Rhythm: Normal rate and regular rhythm.     Heart sounds: Normal heart sounds, S1 normal and S2 normal.    No gallop.     Comments: EKG- NSR, 91 bpm LAE - not new No LVH or Q waves Pulmonary:     Effort: Pulmonary effort is normal.     Breath sounds: No stridor. No wheezing, rhonchi or rales.  Abdominal:     General: Abdomen is flat. Bowel sounds are normal. There is no distension.     Palpations: Abdomen is soft. There is no hepatomegaly, splenomegaly or mass.     Tenderness: There is no abdominal tenderness. There is no guarding.  Musculoskeletal:     Cervical back: Neck supple. No tenderness.     Right lower leg: Edema (trace) present.     Left lower leg: Edema (trace) present.  Lymphadenopathy:     Cervical: No cervical adenopathy.     Right cervical: No superficial, deep or posterior cervical adenopathy.    Left cervical: No superficial, deep or posterior cervical adenopathy.  Skin:    General: Skin is warm and dry.  Neurological:     General: No focal deficit present.     Mental Status: She is alert.  Psychiatric:        Mood and Affect: Mood normal.        Behavior: Behavior normal.    Lab Results  Component Value Date   WBC 8.9 12/03/2020   HGB 12.6 12/03/2020   HCT 38.1 12/03/2020   PLT 390.0 12/03/2020   GLUCOSE 107 (H) 12/03/2020   CHOL 187 05/23/2020   TRIG 111 05/23/2020   HDL 57 05/23/2020   LDLCALC 110 (H) 05/23/2020   ALT 17 12/03/2020   AST 19 12/03/2020   NA 142 12/03/2020   K  3.9 12/03/2020   CL 105 12/03/2020   CREATININE 0.89 12/03/2020   BUN 11 12/03/2020   CO2 29 12/03/2020   TSH 2.550 05/29/2020   INR 1.0 12/03/2020   HGBA1C 5.6 12/03/2020   MICROALBUR 0.9 04/24/2015    MR BRAIN WO CONTRAST  Addendum Date: 06/12/2020   ADDENDUM: -Compared to MRI on 07/23/2015, mild progression of chronic small vessel ischemic disease. Penni Bombard, MD 06/12/2020, 4:55  PM   Result Date: 06/12/2020 [facility] NEUROIMAGING REPORT STUDY DATE: 06/11/20 PATIENT NAME: BEAU VANDUZER DOB: 10/12/1950 MRN: [id number] ORDERING CLINICIAN: Marcial Pacas, MD CLINICAL HISTORY: 70 year old female with aphasia. EXAM: MR BRAIN WO CONTRAST TECHNIQUE: MRI of the brain without contrast was obtained utilizing 5 mm axial slices with T1, T2, T2 flair, SWI and diffusion weighted views.  T1 sagittal and T2 coronal views were obtained. CONTRAST: none COMPARISON: none IMAGING SITE: [facility] [address] (1.5 Tesla MRI)  FINDINGS: No abnormal lesions are seen on diffusion-weighted views to suggest acute ischemia. The cortical sulci, fissures and cisterns are normal in size and appearance. Lateral, third and fourth ventricle are normal in size and appearance. No extra-axial fluid collections are seen. No evidence of mass effect or midline shift. Mild periventricular and 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 On sagittal views the posterior fossa, pituitary gland and corpus callosum are unremarkable. No evidence of intracranial hemorrhage on SWI views. The orbits and their contents, paranasal sinuses and calvarium are unremarkable.  Intracranial flow voids are present.   MRI brain (without) demonstrating: - Mild periventricular and 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 - No acute findings. INTERPRETING PHYSICIAN: Penni Bombard, MD Certified in Neurology, Neurophysiology and Neuroimaging [facility]  [address] [place], [place] [address] [phone]    Assessment & Plan:   Lamonica was seen today for hypertension.  Diagnoses and all orders for this visit:  Primary hypertension- Her blood pressure is not adequately well controlled.  Her EKG is reassuring.  Will add indapamide -     EKG 12-Lead -     Basic metabolic panel; Future -     indapamide (LOZOL) 1.25 MG tablet; Take 1 tablet (1.25 mg total) by mouth daily.  Nodule of right lobe of thyroid gland- I will screen for malignancy and hyperthyroidism. -     TSH; Future -     US THYROID; Future  RLS (restless legs syndrome) -     temazepam (RESTORIL) 30 MG capsule; Take 1 capsule (30 mg total) by mouth at bedtime as needed for sleep. TAKE 1 CAPSULE BY MOUTH AT BEDTIME AS NEEDED FOR SLEEP -     traMADol (ULTRAM) 50 MG tablet; Take 1 tablet (50 mg total) by mouth every 6 (six) hours as needed.  Chronic pain syndrome -     traMADol (ULTRAM) 50 MG tablet; Take 1 tablet (50 mg total) by mouth every 6 (six) hours as needed.  I have discontinued Connye Burkitt. Taliercio's ALPRAZolam. I have also changed her temazepam and traMADol. Additionally, I am having her start on indapamide. Lastly, I am having her maintain her aspirin, almotriptan, Multiple Vitamins-Minerals (MULTIVITAMIN & MINERAL PO), ipratropium, gabapentin, blood glucose meter kit and supplies, famotidine, OneTouch Ultra, escitalopram, Qvar RediHaler, donepezil, traZODone, and atorvastatin.  Meds ordered this encounter  Medications   indapamide (LOZOL) 1.25 MG tablet    Sig: Take 1 tablet (1.25 mg total) by mouth daily.    Dispense:  90 tablet    Refill:  0   temazepam (RESTORIL) 30 MG capsule    Sig: Take 1 capsule (30 mg total) by mouth at bedtime as needed for sleep. TAKE 1 CAPSULE BY MOUTH AT BEDTIME AS NEEDED FOR SLEEP    Dispense:  30 capsule    Refill:  2    This request is for a new prescription for a controlled substance as required by Federal/State  law.    traMADol (ULTRAM) 50 MG tablet    Sig: Take 1 tablet (50 mg total) by mouth every 6 (six) hours as needed.    Dispense:  65 tablet    Refill:  2    Not to exceed 5 additional fills before 11/29/2020      Follow-up: Return in about 3 months (around 06/30/2021).  Scarlette Calico, MD

## 2021-04-07 ENCOUNTER — Ambulatory Visit
Admission: RE | Admit: 2021-04-07 | Discharge: 2021-04-07 | Disposition: A | Discharge status: Home / Self Care | Payer: Medicare Other | Source: Ambulatory Visit | Attending: Internal Medicine | Admitting: Internal Medicine

## 2021-04-07 ENCOUNTER — Other Ambulatory Visit: Payer: Self-pay

## 2021-04-07 DIAGNOSIS — E041 Nontoxic single thyroid nodule: Secondary | ICD-10-CM

## 2021-04-08 ENCOUNTER — Other Ambulatory Visit: Payer: Self-pay | Admitting: Internal Medicine

## 2021-04-08 DIAGNOSIS — E041 Nontoxic single thyroid nodule: Secondary | ICD-10-CM

## 2021-04-30 ENCOUNTER — Telehealth: Payer: Self-pay | Admitting: Speech Pathology

## 2021-04-30 NOTE — Telephone Encounter (Signed)
Called patient to inform her that her device has finally been delivered. Left a voicemail instructing Tonye and friend, Carlo, to call back and schedule an appointment.   [address] [place], [place], [address]

## 2021-05-13 ENCOUNTER — Other Ambulatory Visit: Payer: Self-pay

## 2021-05-13 ENCOUNTER — Encounter: Payer: Self-pay | Admitting: Speech Pathology

## 2021-05-13 ENCOUNTER — Ambulatory Visit: Payer: Medicare Other | Attending: Neurology | Admitting: Speech Pathology

## 2021-05-13 DIAGNOSIS — F0280 Dementia in other diseases classified elsewhere without behavioral disturbance: Secondary | ICD-10-CM | POA: Insufficient documentation

## 2021-05-13 DIAGNOSIS — G3101 Pick's disease: Secondary | ICD-10-CM | POA: Insufficient documentation

## 2021-05-13 NOTE — Therapy (Signed)
[facility] [address] [place], [place], [address] Phone: [phone]   Fax:  [phone]  Patient Details  Name: Meghan Hickman MRN: [id number] Date of Birth: 1951/02/02 Referring Provider:  Etta Grandchild, MD  Encounter Date: 05/13/2021  Pt not seen for tx due to late arrival. SLP provided pt with updated AAC device from [employer]. SLP scheduled pt for x1 additional session to ensure pt feels confident in d/c plans.   Dorena Bodo MS, [place], CBIS  05/13/2021, 4:35 PM  [facility] [address] [place], [place], [address] Phone: [phone]   Fax:  [phone]

## 2021-05-25 ENCOUNTER — Ambulatory Visit: Payer: Medicare Other | Admitting: Speech Pathology

## 2021-06-15 ENCOUNTER — Ambulatory Visit (INDEPENDENT_AMBULATORY_CARE_PROVIDER_SITE_OTHER): Payer: Medicare Other | Admitting: Neurology

## 2021-06-15 ENCOUNTER — Encounter: Payer: Self-pay | Admitting: Neurology

## 2021-06-15 VITALS — BP 128/60 | HR 73 | Ht 59.0 in | Wt 154.5 lb

## 2021-06-15 DIAGNOSIS — F32A Depression, unspecified: Secondary | ICD-10-CM | POA: Insufficient documentation

## 2021-06-15 DIAGNOSIS — F809 Developmental disorder of speech and language, unspecified: Secondary | ICD-10-CM | POA: Diagnosis not present

## 2021-06-15 MED ORDER — DONEPEZIL HCL 10 MG PO TABS: 10.0000 mg | ORAL_TABLET | Freq: Every day | ORAL | 4 refills | Status: DC

## 2021-06-15 MED ORDER — MEMANTINE HCL 10 MG PO TABS: 10.0000 mg | ORAL_TABLET | Freq: Two times a day (BID) | ORAL | 11 refills | Status: DC

## 2021-06-15 NOTE — Progress Notes (Signed)
Chief Complaint  Patient presents with   Follow-up    Room 14 w/ her friend, Meghan Hickman. Feels her speech has continued to decline. She is writing notes on a white board at today's visit.     [place], seen in request by her primary care physician Meghan Hickman, Arvid Right, MD for language difficulty  I reviewed and summarized the referring note.  Past medical history Obstructive sleep apnea, could not tolerate CPAP machine Migraine headaches Depression  I reviewed most recent office visit in February 2020 by [facility]ist Meghan Hickman, [place].  I reviewed, patient was given diagnosis of primary progressive aphasia, donepezil 10 mg daily  From reviewing the chart, patient reported her symptoms started gradually around April 2016 and progressively getting worse, she noted speech problem after root canal, she said she has trouble pronouncing words, spelling words and remembering words, she states the worst in her hand will not, all, she does not feel like her writing has changed, she is unsure if her joint has changed, she had no problem working with her hands such as cooking cleaning and using instrument.  She feels as if she could not spell like what she could before  While reading she has to really slowly even 1 obturating of mild, she put on the subtitles 1 watching TV, but has difficulty keeping up with subtitles.  She denies visual or auditory hallucinations, she denies difficulty swallowing,  She feels like exhausted her to talk, she feels as takes a lot of energy to talk, she has tried and failed speech therapy,  EEG reported normal February 2019, MRI of the brain 2019, mild periventricular, subcortical and pontine small vessel disease  She was given the diagnosis of primary progressive aphasia   She had a history of hypertension, hyperlipidemia, diabetes, depression, was recently started on Depakote 500 mg daily   She began to noticed gradual onset slurred speech  since April 2016, initially attributed to her dental work, even after it was fixed, she continue have progressive slow talking, struggle with words, intermittent slurring, she denies chewing swallowing difficulty, she denies visual loss, she work as a Astronomer, 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 MRI of the brain reviewed, mild small vessel disease, more on left hemisphere  She has no acute lesions   UPDATE May 15th 2017:  She suffered sinus infection in early May, is treated with antibiotics, she had a temporary upper denture, complains of falling object sensation, mildly slurred speech, but overall is getting better, denies swallowing difficulty, she denies limb muscle weakness   I have reviewed laboratory evaluation, elevated CBC in Dec 18 2014 16.2,   Personally reviewed MRI of the brain with patients again, supratentorium small vessel disease, especially at the deep white matter, she also complains of worsening depression, bilateral ear tinnitus,   UPDATE Sep 15 2017: Her speech is getting worse, the words would not come out, she has no trouble swallowing, no comprehensive problem. She has no double vision.   She has mild memory loss.    She has no family history of memory loss.  She has no weakness.   She still works at post office, sorting  mail. She complains of depression for many years, she lives alone.  She goes out with her friends occasionally.   She also complains of vibratory sensation in her head, snoring, frequent awakening, excessive daytime fatigue and sleepiness   UPDATE May 25 2018: She was [facility] at [facility] in July 2019 by Meghan Hickman, was diagnosed with primary  progressive aphasia, no trouble understanding, but has word finding difficulty, gradually getting worse.   She still works at post [employer], lives alone, denies significant memory loss   UPDATE May 29 2020: She  was diagnosed with obstructive sleep apnea, using CPAP machine, but stopped using it because she could not tolerate the facial mask,  She was followed by [facility] neurology, most recent visit in February 2020, was diagnosed with primary progressive aphasia, started on Aricept  Today she came in with a list of questions, denies swallowing difficulty, still lives alone, using device to help her pronounce, seems to have no comprehensive difficulty, when she does talk, with deliberate effort,she was able to write without much difficulty  UPDATE Jun 15 2021: She is accompanied by her friend Meghan Hickman at today's visit, Meghan Hickman has known Meghan Hickman for 40 years now,   She stays at home alone most of time, does not very social, she complains of depression, has difficulty sleeping, she had speech therapy, did not improve much, more and more rely on her speaking device.   She still drive without much difficulty, she denies significant memory loss.   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, since retirement, she spent most of the time staying at home,  Personally reviewed MRI of the brain November 2021, no significant atrophy, mild small vessel disease  Laboratory evaluations in October 2021: Normal negative ANA, TSH, folic acid, HIV, RPR, Z30, CBC, lipid panel showed mild elevated LDL 110, CMP showed elevated glucose 180, creatinine of 0.84    REVIEW OF SYSTEMS: Full 14 system review of systems performed and notable only for as above  See HPI  ALLERGIES: No Known Allergies  HOME MEDICATIONS: Current Outpatient Medications  Medication Sig Dispense Refill   almotriptan (AXERT) 12.5 MG tablet Take 12.5 mg by mouth daily as needed for migraine.   4   aspirin 81 MG tablet Take 81 mg  by mouth daily.     atorvastatin (LIPITOR) 20 MG tablet TAKE 1 TABLET BY MOUTH EVERY DAY 90 tablet 0   beclomethasone (QVAR REDIHALER) 80 MCG/ACT inhaler Inhale 1 puff into the lungs 2 (two) times daily. 1 each 11   blood glucose meter kit and supplies Dispense based on patient and insurance preference. Use up to three times daily as directed. (FOR ICD-10 E11.65). Dispense 100 lancets and test strips and 1 lancing device to match meter. 1 each 0   donepezil (ARICEPT) 10 MG tablet Take 1 tablet (10 mg total) by mouth at bedtime. 90 tablet 4   escitalopram (LEXAPRO) 20 MG tablet TAKE 1 TABLET BY MOUTH EVERY DAY 90 tablet 1   famotidine (PEPCID) 40 MG tablet Take 1 tablet (40 mg total) by mouth daily. 90 tablet 3   gabapentin (NEURONTIN) 300 MG capsule TAKE 1 CAPSULE BY MOUTH THREE TIMES A DAY 270 capsule 0   indapamide (LOZOL) 1.25 MG tablet Take 1 tablet (1.25 mg total) by mouth daily. 90 tablet 0   ipratropium (ATROVENT) 0.03 % nasal spray Place 2 sprays into both nostrils 2 (two) times daily. 30 mL 0   Multiple Vitamins-Minerals (MULTIVITAMIN & MINERAL  PO) Take 1 tablet by mouth daily.     ONETOUCH ULTRA test strip USE UP TO 3 TIMES A DAY AS DIRECTED 100 strip 2   temazepam (RESTORIL) 30 MG capsule Take 1 capsule (30 mg total) by mouth at bedtime as needed for sleep. TAKE 1 CAPSULE BY MOUTH AT BEDTIME AS NEEDED FOR SLEEP 30 capsule 2   traMADol (ULTRAM) 50 MG tablet Take 1 tablet (50 mg total) by mouth every 6 (six) hours as needed. 65 tablet 2   traZODone (DESYREL) 50 MG tablet TAKE 1/2 TO 1 TABLET BY MOUTH AT BEDTIME AS NEEDED FOR SLEEP 90 tablet 2   No current facility-administered medications for this visit.    PAST MEDICAL HISTORY: Past Medical History:  Diagnosis Date   Arthritis    Depression    Diabetes mellitus without complication ([place])    Diverticulitis    GERD (gastroesophageal reflux disease)    Headache    Heart murmur    Hyperlipidemia    Hypertension    Speech problem     Wears glasses     PAST SURGICAL HISTORY: Past Surgical History:  Procedure Laterality Date   CHOLECYSTECTOMY N/A 12/05/2014   Procedure: LAPAROSCOPIC CHOLECYSTECTOMY;  Surgeon: Rolm Bookbinder, MD;  Location: [facility];  Service: General;  Laterality: N/A;   SHOULDER ARTHROSCOPY WITH SUBACROMIAL DECOMPRESSION AND BICEP TENDON REPAIR Hickman 04/23/2013   Procedure: SHOULDER ARTHROSCOPY WITH SUBACROMIAL DECOMPRESSION AND BICEP TENDON TENOTOMY;  Surgeon: Nita Sells, MD;  Location: [facility];  Service: Orthopedics;  Laterality: Hickman;    FAMILY HISTORY: Family History  Problem Relation Age of Onset   Stroke Mother    Hypertension Mother    Diabetes Mother    Breast cancer Mother    Healthy Father    Heart disease Brother     SOCIAL HISTORY: Social History   Socioeconomic History   Marital status: Single    Spouse name: Not on file   Number of children: 0   Years of education: 16   Highest education level: Not on file  Occupational History   Occupation: Scientist, research (medical)  Tobacco Use   Smoking status: Former    Packs/day: 0.50    Years: 20.00    Pack years: 10.00    Types: Cigarettes    Start date: 04/30/2012    Quit date: 08/16/2012    Years since quitting: 8.8   Smokeless tobacco: Never  Vaping Use   Vaping Use: Never used  Substance and Sexual Activity   Alcohol use: No    Alcohol/week: 0.0 standard drinks   Drug use: No   Sexual activity: Never    Comment: was smoking 1/2 pppd  Other Topics Concern   Not on file  Social History Narrative   Lives at home alone.   Hickman-handed.   2 cups caffeine per day.   Social Determinants of Health   Financial Resource Strain: Not on file  Food Insecurity: Not on file  Transportation Needs: Not on file  Physical Activity: Not on file  Stress: Not on file  Social Connections: Not on file  Intimate Partner Violence: Not on file   PHYSICAL EXAM   Vitals:   06/15/21 1449  BP: 128/60  Pulse: 73   Weight: 154 lb 8 oz (70.1 kg)  Height: 4' 11"  (1.499 m)   Not recorded     Body mass index is 31.21 kg/m.  PHYSICAL EXAMNIATION: MMSE - Mini Mental State Exam 12/01/2020 07/18/2019  Not completed: (No  Data) -  Orientation to time 5 5  Orientation to Place 5 5  Registration 2 3  Attention/ Calculation 2 5  Attention/Calculation-comments world  "drolw" -  Recall 3 3  Language- name 2 objects 2 2  Language- repeat 0 0  Language- repeat-comments ifs ands buts -  Language- follow 3 step command 3 3  Language- read & follow direction 1 1  Write a sentence 1 1  Write a sentence-comments i like rainy days couple misss spellings  Copy design 1 1  Copy design-comments - named 9 animals  Total score 25 29   Gen: NAD, expressive aphasia, well nourised, well groomed                     Cardiovascular: Regular rate rhythm, no peripheral edema, warm, nontender. Eyes: Conjunctivae clear without exudates or hemorrhage Pulmonary: Clear to auscultation bilaterally   NEUROLOGICAL EXAM:  MENTAL STATUS: Speech/cognition: Normal comprehension, able to pronounce with deliberate effort, using electronic device,     CRANIAL NERVES: CN II: Visual fields are full to confrontation. Pupils are round equal and briskly reactive to light. CN III, IV, VI: extraocular movement are normal. No ptosis. CN V: Facial sensation is intact to light touch CN VII: Face is symmetric with normal eye closure  CN VIII: Hearing is normal to causal conversation. CN IX, X: Phonation is normal. CN XI: Head turning and shoulder shrug are intact  MOTOR: There is no pronator drift of out-stretched arms. Muscle bulk and tone are normal. Muscle strength is normal.  REFLEXES: Reflexes are 2+ throughout  SENSORY: Intact to light touch COORDINATION: There is no trunk or limb dysmetria noted.  GAIT/STANCE: Gait is normal   DIAGNOSTIC DATA (LABS, IMAGING, TESTING) - I reviewed patient records, labs, notes, testing  and imaging myself where available.  ASSESSMENT AND PLAN  Meghan Hickman is a 70 y.o. female    Language difficulty  Unsure etiology, patient reported continued progression of her symptoms  She was diagnosed with primary progressive aphasia, but her speech pattern, well-preserved cognitive function, no significant evidence of atrophy on MRI of the brain, argue against a progressive central nervous system degenerative disorder She does have significant depression, confirmed by her friend She will definitely benefit better control of her depression, anxiety insomnia She has been taking Aricept 10 mg daily for a while, will add on Namenda 10 mg twice a day    Marcial Pacas, M.D. Ph.D.  [facility] [place], [place] [address] Phone: [phone] Fax:      [phone]

## 2021-06-25 ENCOUNTER — Other Ambulatory Visit: Payer: Self-pay | Admitting: Internal Medicine

## 2021-06-25 DIAGNOSIS — I10 Essential (primary) hypertension: Secondary | ICD-10-CM

## 2021-06-30 ENCOUNTER — Ambulatory Visit (INDEPENDENT_AMBULATORY_CARE_PROVIDER_SITE_OTHER): Payer: Medicare Other | Admitting: Internal Medicine

## 2021-06-30 ENCOUNTER — Other Ambulatory Visit: Payer: Self-pay

## 2021-06-30 ENCOUNTER — Encounter: Payer: Self-pay | Admitting: Internal Medicine

## 2021-06-30 VITALS — BP 118/70 | HR 81 | Temp 98.3°F | Ht 59.0 in | Wt 156.2 lb

## 2021-06-30 DIAGNOSIS — E041 Nontoxic single thyroid nodule: Secondary | ICD-10-CM | POA: Diagnosis not present

## 2021-06-30 DIAGNOSIS — T502X5A Adverse effect of carbonic-anhydrase inhibitors, benzothiadiazides and other diuretics, initial encounter: Secondary | ICD-10-CM

## 2021-06-30 DIAGNOSIS — I10 Essential (primary) hypertension: Secondary | ICD-10-CM

## 2021-06-30 DIAGNOSIS — Z23 Encounter for immunization: Secondary | ICD-10-CM

## 2021-06-30 DIAGNOSIS — E785 Hyperlipidemia, unspecified: Secondary | ICD-10-CM

## 2021-06-30 DIAGNOSIS — E876 Hypokalemia: Secondary | ICD-10-CM

## 2021-06-30 DIAGNOSIS — R739 Hyperglycemia, unspecified: Secondary | ICD-10-CM | POA: Insufficient documentation

## 2021-06-30 NOTE — Patient Instructions (Signed)
Thyroid Nodule  A thyroid nodule is an isolated growth of thyroid cells that forms a lump in your thyroid gland. The thyroid gland is a butterfly-shaped gland. It is found in the lower front of your neck. This gland sends chemical messengers (hormones) through your blood to all parts of your body. These hormones are important inregulating your body temperature and helping your body to use energy. Thyroid nodules are common. Most are not cancerous (benign). You may have one nodule or several nodules. Different types of thyroid nodules include nodules that: Grow and fill with fluid (thyroid cysts). Produce too much thyroid hormone (hot nodules or hyperthyroid). Produce no thyroid hormone (cold nodules or hypothyroid). Form from cancer cells (thyroid cancers). What are the causes? In most cases, the cause of this condition is not known. What increases the risk? The following factors may make you more likely to develop this condition. Age. Thyroid nodules become more common in people who are older than 71 years of age. Gender. Benign thyroid nodules are more common in women. Cancerous (malignant) thyroid nodules are more common in men. A family history that includes: Thyroid nodules. Pheochromocytoma. Thyroid carcinoma. Hyperparathyroidism. Certain kinds of thyroid diseases, such as Hashimoto's thyroiditis. Lack of iodine in your diet. A history of head and neck radiation, such as from previous cancer treatment. What are the signs or symptoms? In many cases, there are no symptoms. If you have symptoms, they may include: A lump in your lower neck. Feeling a lump or tickle in your throat. Pain in your neck, jaw, or ear. Having trouble swallowing. Hot nodules may cause symptoms that include: Weight loss. Warm, flushed skin. Feeling hot. Feeling nervous. A racing heartbeat. Cold nodules may cause symptoms that include: Weight gain. Dry skin. Brittle hair. This may also occur with hair  loss. Feeling cold. Fatigue. Thyroid cancer nodules may cause symptoms that include: Hard nodules that feel stuck to the thyroid gland. Hoarseness. Lumps in the glands near your thyroid (lymph nodes). How is this diagnosed? A thyroid nodule may be felt by your health care provider during a physical exam. This condition may also be diagnosed based on your symptoms. You may also have tests, including: An ultrasound. This may be done to confirm the diagnosis. A biopsy. This involves taking a sample from the nodule and looking at it under a microscope. Blood tests to make sure that your thyroid is working properly. A thyroid scan. This test uses a radioactive tracer injected into a vein to create an image of the thyroid gland on a computer screen. Imaging tests such as MRI or CT scan. These may be done if: Your nodule is large. Your nodule is blocking your airway. Cancer is suspected. How is this treated? Treatment depends on the cause and size of your nodule or nodules. If the nodule is benign, treatment may not be necessary. Your health care provider may monitor the nodule to see if it goes away without treatment. If the nodule continues to grow, is cancerous, or does not go away, treatment may be needed. Treatment may include: Having a cystic nodule drained with a needle. Ablation therapy. In this treatment, alcohol is injected into the area of the nodule to destroy the cells. Ablation with heat (thermal ablation) may also be used. Radioactive iodine. In this treatment, radioactive iodine is given as a pill or liquid that you drink. This substance causes the thyroid nodule to shrink. Surgery to remove the nodule. Part or all of your thyroid gland may   need to be removed as well. Medicines. Follow these instructions at home: Pay attention to any changes in your nodule. Take over-the-counter and prescription medicines only as told by your health care provider. Keep all follow-up visits as told  by your health care provider. This is important. Contact a health care provider if: Your voice changes. You have trouble swallowing. You have pain in your neck, ear, or jaw that is getting worse. Your nodule gets bigger. Your nodule starts to make it harder for you to breathe. Your muscles look like they are shrinking (muscle wasting). Get help right away if: You have chest pain. There is a loss of consciousness. You have a sudden fever. You feel confused. You are seeing or hearing things that other people do not see or hear (having hallucinations). You feel very weak. You have mood swings. You feel very restless. You feel suddenly nauseous or throw up. You suddenly have diarrhea. Summary A thyroid nodule is an isolated growth of thyroid cells that forms a lump in your thyroid gland. Thyroid nodules are common. Most are not cancerous (benign). You may have one nodule or several nodules. Treatment depends on the cause and size of your nodule or nodules. If the nodule is benign, treatment may not be necessary. Your health care provider may monitor the nodule to see if it goes away without treatment. If the nodule continues to grow, is cancerous, or does not go away, treatment may be needed. This information is not intended to replace advice given to you by your health care provider. Make sure you discuss any questions you have with your healthcare provider. Document Revised: 03/10/2018 Document Reviewed: 03/13/2018 Elsevier Patient Education  2022 [employer]  

## 2021-06-30 NOTE — Progress Notes (Signed)
Subjective:  Patient ID: Meghan Hickman, female    DOB: [birth year]  Age: 70 y.o. MRN: [id number]  CC: Follow-up (3 month f/u)  This visit occurred during the SARS-CoV-2 public health emergency.  Safety protocols were in place, including screening questions prior to the visit, additional usage of staff PPE, and extensive cleaning of exam room while observing appropriate contact time as indicated for disinfecting solutions.     HPI Meghan Hickman presents for f/up -  She cannot clearly indicate whether or not she has seen the ENT doctor about the right thyroid nodule.  She tells me it does not bother her.  She wants to know whether or not she should continue taking [place] and metformin.  She is active and denies chest pain or shortness of breath.  Outpatient Medications Prior to Visit  Medication Sig Dispense Refill   almotriptan (AXERT) 12.5 MG tablet Take 12.5 mg by mouth daily as needed for migraine.   4   aspirin 81 MG tablet Take 81 mg by mouth daily.     atorvastatin (LIPITOR) 20 MG tablet TAKE 1 TABLET BY MOUTH EVERY DAY 90 tablet 0   beclomethasone (QVAR REDIHALER) 80 MCG/ACT inhaler Inhale 1 puff into the lungs 2 (two) times daily. 1 each 11   blood glucose meter kit and supplies Dispense based on patient and insurance preference. Use up to three times daily as directed. (FOR ICD-10 E11.65). Dispense 100 lancets and test strips and 1 lancing device to match meter. 1 each 0   donepezil (ARICEPT) 10 MG tablet Take 1 tablet (10 mg total) by mouth at bedtime. 90 tablet 4   escitalopram (LEXAPRO) 20 MG tablet TAKE 1 TABLET BY MOUTH EVERY DAY 90 tablet 1   famotidine (PEPCID) 40 MG tablet Take 1 tablet (40 mg total) by mouth daily. 90 tablet 3   gabapentin (NEURONTIN) 300 MG capsule TAKE 1 CAPSULE BY MOUTH THREE TIMES A DAY 270 capsule 0   ipratropium (ATROVENT) 0.03 % nasal spray Place 2 sprays into both nostrils 2 (two) times daily. 30 mL 0   memantine (NAMENDA) 10 MG tablet Take 1  tablet (10 mg total) by mouth 2 (two) times daily. 60 tablet 11   Multiple Vitamins-Minerals (MULTIVITAMIN & MINERAL PO) Take 1 tablet by mouth daily.     ONETOUCH ULTRA test strip USE UP TO 3 TIMES A DAY AS DIRECTED 100 strip 2   temazepam (RESTORIL) 30 MG capsule Take 1 capsule (30 mg total) by mouth at bedtime as needed for sleep. TAKE 1 CAPSULE BY MOUTH AT BEDTIME AS NEEDED FOR SLEEP 30 capsule 2   traMADol (ULTRAM) 50 MG tablet Take 1 tablet (50 mg total) by mouth every 6 (six) hours as needed. 65 tablet 2   traZODone (DESYREL) 50 MG tablet TAKE 1/2 TO 1 TABLET BY MOUTH AT BEDTIME AS NEEDED FOR SLEEP 90 tablet 2   indapamide (LOZOL) 1.25 MG tablet TAKE 1 TABLET BY MOUTH DAILY. 90 tablet 0   No facility-administered medications prior to visit.    ROS Review of Systems  Constitutional:  Negative for diaphoresis and fatigue.  HENT: Negative.  Negative for trouble swallowing.   Respiratory:  Negative for cough, shortness of breath and wheezing.   Cardiovascular:  Negative for chest pain, palpitations and leg swelling.  Gastrointestinal:  Negative for abdominal pain.  Genitourinary: Negative.  Negative for difficulty urinating.  Musculoskeletal: Negative.   Skin: Negative.   Neurological:  Positive for speech difficulty.  Hematological:  Negative.   Psychiatric/Behavioral:  Positive for confusion and decreased concentration. Negative for agitation and behavioral problems.    Objective:  BP 118/70 (BP Location: Left Arm, Patient Position: Sitting, Cuff Size: Normal)   Pulse 81   Temp 98.3 F (36.8 C) (Oral)   Ht 4' 11" (1.499 m)   Wt 156 lb 3.2 oz (70.9 kg)   SpO2 96%   BMI 31.55 kg/m   BP Readings from Last 3 Encounters:  06/30/21 118/70  06/15/21 128/60  03/30/21 (!) 144/96    Wt Readings from Last 3 Encounters:  06/30/21 156 lb 3.2 oz (70.9 kg)  06/15/21 154 lb 8 oz (70.1 kg)  03/30/21 158 lb (71.7 kg)    Physical Exam Vitals reviewed.  HENT:     Nose: Nose normal.      Mouth/Throat:     Mouth: Mucous membranes are moist.  Eyes:     Conjunctiva/sclera: Conjunctivae normal.  Neck:     Thyroid: Thyroid mass and thyromegaly present. No thyroid tenderness.     Comments: Right thyroid nodule is unchanged Cardiovascular:     Rate and Rhythm: Normal rate and regular rhythm.     Heart sounds: No murmur heard. Pulmonary:     Effort: Pulmonary effort is normal.     Breath sounds: No stridor. No wheezing, rhonchi or rales.  Abdominal:     General: Abdomen is flat.     Palpations: There is no mass.     Tenderness: There is no abdominal tenderness. There is no guarding.     Hernia: No hernia is present.  Musculoskeletal:        General: Normal range of motion.     Cervical back: Normal range of motion.     Right lower leg: No edema.     Left lower leg: No edema.  Skin:    General: Skin is warm and dry.  Neurological:     General: No focal deficit present.     Mental Status: She is alert.  Psychiatric:        Mood and Affect: Mood normal.        Behavior: Behavior normal.        Thought Content: Thought content normal.    Lab Results  Component Value Date   WBC 8.9 12/03/2020   HGB 12.6 12/03/2020   HCT 38.1 12/03/2020   PLT 390.0 12/03/2020   GLUCOSE 120 (H) 06/30/2021   CHOL 157 06/30/2021   TRIG 84.0 06/30/2021   HDL 51.00 06/30/2021   LDLCALC 90 06/30/2021   ALT 17 12/03/2020   AST 19 12/03/2020   NA 139 06/30/2021   K 3.3 (L) 06/30/2021   CL 103 06/30/2021   CREATININE 0.89 06/30/2021   BUN 11 06/30/2021   CO2 28 06/30/2021   TSH 1.10 06/30/2021   INR 1.0 12/03/2020   HGBA1C 5.9 06/30/2021   MICROALBUR 0.9 04/24/2015    US THYROID  Result Date: 04/08/2021 CLINICAL DATA:  Right thyroid nodule EXAM: THYROID ULTRASOUND TECHNIQUE: Ultrasound examination of the thyroid gland and adjacent soft tissues was performed. COMPARISON:  06/26/2015 08/05/2015 FINDINGS: Parenchymal Echotexture: Mildly heterogeneous Isthmus: 0.4 cm Right  lobe: 5.6 x 2.9 x 4.0 cm Left lobe: 4.5 x 1.3 x 1.6 cm _________________________________________________________ Estimated total number of nodules >/= 1 cm: 2 Number of spongiform nodules >/=  2 cm not described below (TR1): 0 Number of mixed cystic and solid nodules >/= 1.5 cm not described below (TR2): 0 _________________________________________________________ Nodule # 1: Prior  biopsy: Yes Location: Right; mid Maximum size: 5.4 cm; Other 2 dimensions: 2.6 x 3.9 cm, previously, 3.8 x 2.0 x 2.0 cm Composition: solid/almost completely solid (2) Echogenicity: isoechoic (1) Shape: not taller-than-wide (0) Margins: smooth (0) Echogenic foci: none (0) ACR TI-RADS total points: 3. ACR TI-RADS risk category:  TR3 (3 points). Significant change in size (>/= 20% in two dimensions and minimal increase of 2 mm): Yes Change in features: No Change in ACR TI-RADS risk category: No ACR TI-RADS recommendations: **Given size (>/= 2.5 cm) and appearance, fine needle aspiration of this mildly suspicious nodule should be considered based on TI-RADS criteria. _________________________________________________________ Nodule # 2: Prior biopsy: No Location: Left; superior Maximum size: 1.1 cm; Other 2 dimensions: 0.9 x 0.7 cm, previously, 1.1 x 0.7 x 0.7 cm Composition: solid/almost completely solid (2) Echogenicity: hypoechoic (2) Shape: taller-than-wide (3) Margins: smooth (0) Echogenic foci: none (0) ACR TI-RADS total points: 7. ACR TI-RADS risk category:  TR5 (>/= 7 points). Significant change in size (>/= 20% in two dimensions and minimal increase of 2 mm): No Change in features: No Change in ACR TI-RADS risk category: Yes ACR TI-RADS recommendations: Given stability of size since 2016, this is most likely a benign nodule. _________________________________________________________ IMPRESSION: Nodule 1 (TI-RADS 3) located in the mid right thyroid lobe demonstrates significant interval growth. Prior FNA of this nodule was performed on  08/05/2015. Repeat FNA should be considered given interval growth. The above is in keeping with the ACR TI-RADS recommendations - J Am Coll Radiol 2017;14:587-595. Electronically Signed   By: Miachel Roux M.D.   On: 04/08/2021 07:56    Assessment & Plan:   [place] was seen today for follow-up.  Diagnoses and all orders for this visit:  Primary hypertension- Her blood pressure is somewhat overcontrolled.  She also has hypokalemia.  Will discontinue indapamide for the next 3 months and will treat the hypokalemia. -     TSH; Future -     Basic metabolic panel; Future -     Basic metabolic panel -     TSH  Nodule of right lobe of thyroid gland -     TSH; Future -     Ambulatory referral to ENT -     TSH  Hyperlipidemia with target LDL less than 130- LDL goal achieved. Doing well on the statin  -     Lipid panel; Future -     Lipid panel  Chronic hyperglycemia- Her A1c is at 5.9%.  I have advised her that she does not need to take metformin or Farxiga. -     Basic metabolic panel; Future -     Hemoglobin A1c; Future -     Hemoglobin A1c -     Basic metabolic panel  Flu vaccine need -     Flu Vaccine QUAD High Dose(Fluad)  Diuretic-induced hypokalemia -     potassium chloride SA (KLOR-CON) 20 MEQ tablet; Take 1 tablet (20 mEq total) by mouth 2 (two) times daily.  I have discontinued Connye Burkitt. Heringer's indapamide. I am also having her start on potassium chloride SA. Additionally, I am having her maintain her aspirin, almotriptan, Multiple Vitamins-Minerals (MULTIVITAMIN & MINERAL PO), ipratropium, gabapentin, blood glucose meter kit and supplies, famotidine, OneTouch Ultra, escitalopram, Qvar RediHaler, traZODone, atorvastatin, temazepam, traMADol, donepezil, and memantine.  Meds ordered this encounter  Medications   potassium chloride SA (KLOR-CON) 20 MEQ tablet    Sig: Take 1 tablet (20 mEq total) by mouth 2 (two) times daily.  Dispense:  180 tablet    Refill:  0       Follow-up: Return in about 3 months (around 09/30/2021).  Scarlette Calico, MD

## 2021-07-01 DIAGNOSIS — Z23 Encounter for immunization: Secondary | ICD-10-CM | POA: Insufficient documentation

## 2021-07-01 DIAGNOSIS — E876 Hypokalemia: Secondary | ICD-10-CM | POA: Insufficient documentation

## 2021-07-01 DIAGNOSIS — T502X5A Adverse effect of carbonic-anhydrase inhibitors, benzothiadiazides and other diuretics, initial encounter: Secondary | ICD-10-CM | POA: Insufficient documentation

## 2021-07-01 LAB — BASIC METABOLIC PANEL
BUN: 11 mg/dL (ref 6–23)
CO2: 28 mEq/L (ref 19–32)
Calcium: 9.4 mg/dL (ref 8.4–10.5)
Chloride: 103 mEq/L (ref 96–112)
Creatinine, Ser: 0.89 mg/dL (ref 0.40–1.20)
GFR: 65.26 mL/min (ref >60.00)
Glucose, Bld: 120 mg/dL — ABNORMAL HIGH (ref 70–99)
Potassium: 3.3 mEq/L — ABNORMAL LOW (ref 3.5–5.1)
Sodium: 139 mEq/L (ref 135–145)

## 2021-07-01 LAB — LIPID PANEL
Cholesterol: 157 mg/dL (ref 0–200)
HDL: 51 mg/dL (ref >39.00)
LDL Cholesterol: 90 mg/dL (ref 0–99)
NonHDL: 106.34
Total CHOL/HDL Ratio: 3
Triglycerides: 84 mg/dL (ref 0.0–149.0)
VLDL: 16.8 mg/dL (ref 0.0–40.0)

## 2021-07-01 LAB — HEMOGLOBIN A1C: Hgb A1c MFr Bld: 5.9 % (ref 4.6–6.5)

## 2021-07-01 LAB — TSH: TSH: 1.1 u[IU]/mL (ref 0.35–5.50)

## 2021-07-01 MED ORDER — POTASSIUM CHLORIDE CRYS ER 20 MEQ PO TBCR: 20.0000 meq | EXTENDED_RELEASE_TABLET | Freq: Two times a day (BID) | ORAL | 0 refills | Status: DC

## 2021-07-07 ENCOUNTER — Other Ambulatory Visit: Payer: Self-pay | Admitting: Neurology

## 2021-07-11 NOTE — Progress Notes (Signed)
Chart reviewed, agree above plan ?

## 2021-09-07 ENCOUNTER — Other Ambulatory Visit: Payer: Self-pay | Admitting: Internal Medicine

## 2021-09-07 DIAGNOSIS — E785 Hyperlipidemia, unspecified: Secondary | ICD-10-CM

## 2021-09-07 DIAGNOSIS — E118 Type 2 diabetes mellitus with unspecified complications: Secondary | ICD-10-CM

## 2021-09-20 ENCOUNTER — Other Ambulatory Visit: Payer: Self-pay | Admitting: Internal Medicine

## 2021-09-20 DIAGNOSIS — I10 Essential (primary) hypertension: Secondary | ICD-10-CM

## 2021-09-24 ENCOUNTER — Encounter: Payer: Self-pay | Admitting: Neurology

## 2021-09-25 ENCOUNTER — Encounter: Payer: Self-pay | Admitting: *Deleted

## 2021-09-30 ENCOUNTER — Other Ambulatory Visit: Payer: Self-pay | Admitting: Internal Medicine

## 2021-09-30 DIAGNOSIS — T502X5A Adverse effect of carbonic-anhydrase inhibitors, benzothiadiazides and other diuretics, initial encounter: Secondary | ICD-10-CM

## 2021-09-30 DIAGNOSIS — E876 Hypokalemia: Secondary | ICD-10-CM

## 2021-10-01 ENCOUNTER — Other Ambulatory Visit: Payer: Self-pay

## 2021-10-01 ENCOUNTER — Ambulatory Visit (INDEPENDENT_AMBULATORY_CARE_PROVIDER_SITE_OTHER): Payer: Medicare Other | Admitting: Internal Medicine

## 2021-10-01 ENCOUNTER — Encounter: Payer: Self-pay | Admitting: Internal Medicine

## 2021-10-01 VITALS — BP 116/68 | HR 90 | Temp 98.2°F | Resp 16 | Ht 59.0 in | Wt 158.0 lb

## 2021-10-01 DIAGNOSIS — G43409 Hemiplegic migraine, not intractable, without status migrainosus: Secondary | ICD-10-CM

## 2021-10-01 DIAGNOSIS — F331 Major depressive disorder, recurrent, moderate: Secondary | ICD-10-CM | POA: Diagnosis not present

## 2021-10-01 DIAGNOSIS — E041 Nontoxic single thyroid nodule: Secondary | ICD-10-CM

## 2021-10-01 DIAGNOSIS — K7581 Nonalcoholic steatohepatitis (NASH): Secondary | ICD-10-CM

## 2021-10-01 DIAGNOSIS — I10 Essential (primary) hypertension: Secondary | ICD-10-CM

## 2021-10-01 LAB — BASIC METABOLIC PANEL WITH GFR
BUN: 10 mg/dL (ref 6–23)
CO2: 30 meq/L (ref 19–32)
Calcium: 9.7 mg/dL (ref 8.4–10.5)
Chloride: 107 meq/L (ref 96–112)
Creatinine, Ser: 0.82 mg/dL (ref 0.40–1.20)
GFR: 71.87 mL/min
Glucose, Bld: 105 mg/dL — ABNORMAL HIGH (ref 70–99)
Potassium: 4 meq/L (ref 3.5–5.1)
Sodium: 142 meq/L (ref 135–145)

## 2021-10-01 LAB — HEPATIC FUNCTION PANEL
ALT: 29 U/L (ref 0–35)
AST: 27 U/L (ref 0–37)
Albumin: 4.2 g/dL (ref 3.5–5.2)
Alkaline Phosphatase: 89 U/L (ref 39–117)
Bilirubin, Direct: 0.1 mg/dL (ref 0.0–0.3)
Total Bilirubin: 0.9 mg/dL (ref 0.2–1.2)
Total Protein: 7.8 g/dL (ref 6.0–8.3)

## 2021-10-01 LAB — TSH: TSH: 1.04 u[IU]/mL (ref 0.35–5.50)

## 2021-10-01 MED ORDER — ALMOTRIPTAN MALATE 12.5 MG PO TABS: 12.5000 mg | ORAL_TABLET | Freq: Every day | ORAL | 4 refills | Status: DC | PRN

## 2021-10-01 MED ORDER — DULOXETINE HCL 30 MG PO CPEP: 30.0000 mg | ORAL_CAPSULE | Freq: Every day | ORAL | 0 refills | Status: DC

## 2021-10-01 NOTE — Patient Instructions (Signed)
Thyroid Nodule  A thyroid nodule is an isolated growth of thyroid cells that forms a lump in your thyroid gland. The thyroid gland is a butterfly-shaped gland. It is found in the lower front of your neck. This gland sends chemical messengers (hormones) through your blood to all parts of your body. These hormones are important inregulating your body temperature and helping your body to use energy. Thyroid nodules are common. Most are not cancerous (benign). You may have one nodule or several nodules. Different types of thyroid nodules include nodules that: Grow and fill with fluid (thyroid cysts). Produce too much thyroid hormone (hot nodules or hyperthyroid). Produce no thyroid hormone (cold nodules or hypothyroid). Form from cancer cells (thyroid cancers). What are the causes? In most cases, the cause of this condition is not known. What increases the risk? The following factors may make you more likely to develop this condition. Age. Thyroid nodules become more common in people who are older than 72 years of age. Gender. Benign thyroid nodules are more common in women. Cancerous (malignant) thyroid nodules are more common in men. A family history that includes: Thyroid nodules. Pheochromocytoma. Thyroid carcinoma. Hyperparathyroidism. Certain kinds of thyroid diseases, such as Hashimoto's thyroiditis. Lack of iodine in your diet. A history of head and neck radiation, such as from previous cancer treatment. What are the signs or symptoms? In many cases, there are no symptoms. If you have symptoms, they may include: A lump in your lower neck. Feeling a lump or tickle in your throat. Pain in your neck, jaw, or ear. Having trouble swallowing. Hot nodules may cause symptoms that include: Weight loss. Warm, flushed skin. Feeling hot. Feeling nervous. A racing heartbeat. Cold nodules may cause symptoms that include: Weight gain. Dry skin. Brittle hair. This may also occur with hair  loss. Feeling cold. Fatigue. Thyroid cancer nodules may cause symptoms that include: Hard nodules that feel stuck to the thyroid gland. Hoarseness. Lumps in the glands near your thyroid (lymph nodes). How is this diagnosed? A thyroid nodule may be felt by your health care provider during a physical exam. This condition may also be diagnosed based on your symptoms. You may also have tests, including: An ultrasound. This may be done to confirm the diagnosis. A biopsy. This involves taking a sample from the nodule and looking at it under a microscope. Blood tests to make sure that your thyroid is working properly. A thyroid scan. This test uses a radioactive tracer injected into a vein to create an image of the thyroid gland on a computer screen. Imaging tests such as MRI or CT scan. These may be done if: Your nodule is large. Your nodule is blocking your airway. Cancer is suspected. How is this treated? Treatment depends on the cause and size of your nodule or nodules. If the nodule is benign, treatment may not be necessary. Your health care provider may monitor the nodule to see if it goes away without treatment. If the nodule continues to grow, is cancerous, or does not go away, treatment may be needed. Treatment may include: Having a cystic nodule drained with a needle. Ablation therapy. In this treatment, alcohol is injected into the area of the nodule to destroy the cells. Ablation with heat (thermal ablation) may also be used. Radioactive iodine. In this treatment, radioactive iodine is given as a pill or liquid that you drink. This substance causes the thyroid nodule to shrink. Surgery to remove the nodule. Part or all of your thyroid gland may   need to be removed as well. Medicines. Follow these instructions at home: Pay attention to any changes in your nodule. Take over-the-counter and prescription medicines only as told by your health care provider. Keep all follow-up visits as told  by your health care provider. This is important. Contact a health care provider if: Your voice changes. You have trouble swallowing. You have pain in your neck, ear, or jaw that is getting worse. Your nodule gets bigger. Your nodule starts to make it harder for you to breathe. Your muscles look like they are shrinking (muscle wasting). Get help right away if: You have chest pain. There is a loss of consciousness. You have a sudden fever. You feel confused. You are seeing or hearing things that other people do not see or hear (having hallucinations). You feel very weak. You have mood swings. You feel very restless. You feel suddenly nauseous or throw up. You suddenly have diarrhea. Summary A thyroid nodule is an isolated growth of thyroid cells that forms a lump in your thyroid gland. Thyroid nodules are common. Most are not cancerous (benign). You may have one nodule or several nodules. Treatment depends on the cause and size of your nodule or nodules. If the nodule is benign, treatment may not be necessary. Your health care provider may monitor the nodule to see if it goes away without treatment. If the nodule continues to grow, is cancerous, or does not go away, treatment may be needed. This information is not intended to replace advice given to you by your health care provider. Make sure you discuss any questions you have with your healthcare provider. Document Revised: 03/10/2018 Document Reviewed: 03/13/2018 [employer] Patient Education  2022 [employer]  

## 2021-10-01 NOTE — Progress Notes (Signed)
Subjective:  Patient ID: Meghan Hickman, female    DOB: 1951/02/11  Age: 71 y.o. MRN: [id number]  CC: Depression  This visit occurred during the SARS-CoV-2 public health emergency.  Safety protocols were in place, including screening questions prior to the visit, additional usage of staff PPE, and extensive cleaning of exam room while observing appropriate contact time as indicated for disinfecting solutions.    HPI Meghan Hickman presents for f/up -   It is difficult to get a history from her but it sounds like she has not seen an ENT surgeon yet because she is afraid that if she has something done to the thyroid nodule it will be painful.  She also tells me that she feels depressed and complains of fatigue, anxiety, and insomnia.  She has stopped taking Lexapro because it was not effective.  She continues to take temazepam for insomnia. She requests a refill of axert for migraine HA's.  Outpatient Medications Prior to Visit  Medication Sig Dispense Refill   atorvastatin (LIPITOR) 20 MG tablet TAKE 1 TABLET BY MOUTH EVERY DAY 90 tablet 0   beclomethasone (QVAR REDIHALER) 80 MCG/ACT inhaler Inhale 1 puff into the lungs 2 (two) times daily. 1 each 11   blood glucose meter kit and supplies Dispense based on patient and insurance preference. Use up to three times daily as directed. (FOR ICD-10 E11.65). Dispense 100 lancets and test strips and 1 lancing device to match meter. 1 each 0   donepezil (ARICEPT) 10 MG tablet Take 1 tablet (10 mg total) by mouth at bedtime. 90 tablet 4   famotidine (PEPCID) 40 MG tablet Take 1 tablet (40 mg total) by mouth daily. 90 tablet 3   gabapentin (NEURONTIN) 300 MG capsule TAKE 1 CAPSULE BY MOUTH THREE TIMES A DAY 270 capsule 0   ipratropium (ATROVENT) 0.03 % nasal spray Place 2 sprays into both nostrils 2 (two) times daily. 30 mL 0   KLOR-CON M20 20 MEQ tablet TAKE 1 TABLET BY MOUTH TWICE A DAY 180 tablet 0   memantine (NAMENDA) 10 MG tablet TAKE 1 TABLET  BY MOUTH TWICE A DAY 180 tablet 4   Multiple Vitamins-Minerals (MULTIVITAMIN & MINERAL PO) Take 1 tablet by mouth daily.     ONETOUCH ULTRA test strip USE UP TO 3 TIMES A DAY AS DIRECTED 100 strip 2   temazepam (RESTORIL) 30 MG capsule Take 1 capsule (30 mg total) by mouth at bedtime as needed for sleep. TAKE 1 CAPSULE BY MOUTH AT BEDTIME AS NEEDED FOR SLEEP 30 capsule 2   traMADol (ULTRAM) 50 MG tablet Take 1 tablet (50 mg total) by mouth every 6 (six) hours as needed. 65 tablet 2   traZODone (DESYREL) 50 MG tablet TAKE 1/2 TO 1 TABLET BY MOUTH AT BEDTIME AS NEEDED FOR SLEEP 90 tablet 2   almotriptan (AXERT) 12.5 MG tablet Take 12.5 mg by mouth daily as needed for migraine.   4   aspirin 81 MG tablet Take 81 mg by mouth daily.     escitalopram (LEXAPRO) 20 MG tablet TAKE 1 TABLET BY MOUTH EVERY DAY 90 tablet 1   No facility-administered medications prior to visit.    ROS Review of Systems  Constitutional:  Positive for fatigue. Negative for appetite change, diaphoresis and unexpected weight change.  HENT:  Negative for trouble swallowing.   Respiratory: Negative.  Negative for cough, choking, shortness of breath and wheezing.   Cardiovascular:  Negative for chest pain, palpitations and leg swelling.  Gastrointestinal: Negative.  Negative for abdominal pain, constipation, diarrhea and nausea.  Genitourinary: Negative.  Negative for difficulty urinating.  Musculoskeletal: Negative.  Negative for arthralgias and myalgias.  Skin: Negative.   Neurological: Negative.  Negative for dizziness, weakness and light-headedness.  Hematological:  Negative for adenopathy. Does not bruise/bleed easily.  Psychiatric/Behavioral:  Positive for dysphoric mood and sleep disturbance. Negative for confusion, decreased concentration, self-injury and suicidal ideas. The patient is nervous/anxious.    Objective:  BP 116/68 (BP Location: Right Arm, Patient Position: Sitting, Cuff Size: Large)    Pulse 90    Temp  98.2 F (36.8 C) (Oral)    Resp 16    Ht _0  (1.499 m)    Wt 158 lb (71.7 kg)    SpO2 97%    BMI 31.91 kg/m   BP Readings from Last 3 Encounters:  10/01/21 116/68  06/30/21 118/70  06/15/21 128/60    Wt Readings from Last 3 Encounters:  10/01/21 158 lb (71.7 kg)  06/30/21 156 lb 3.2 oz (70.9 kg)  06/15/21 154 lb 8 oz (70.1 kg)    Physical Exam Vitals reviewed.  Constitutional:      Appearance: She is not ill-appearing.  HENT:     Nose: Nose normal.     Mouth/Throat:     Mouth: Mucous membranes are moist.     Pharynx: No oropharyngeal exudate.  Eyes:     General: No scleral icterus.    Conjunctiva/sclera: Conjunctivae normal.  Neck:     Thyroid: Thyroid mass and thyromegaly present. No thyroid tenderness.     Comments: Rt T nodule Cardiovascular:     Rate and Rhythm: Normal rate and regular rhythm.     Heart sounds: No murmur heard. Pulmonary:     Effort: Pulmonary effort is normal.     Breath sounds: No stridor. No wheezing, rhonchi or rales.  Abdominal:     General: Abdomen is flat.     Palpations: There is no mass.     Tenderness: There is no abdominal tenderness. There is no guarding or rebound.     Hernia: No hernia is present.  Musculoskeletal:     Cervical back: Neck supple. No tenderness.     Right lower leg: No edema.     Left lower leg: No edema.  Lymphadenopathy:     Cervical: No cervical adenopathy.  Skin:    General: Skin is warm and dry.     Findings: No lesion or rash.  Neurological:     General: No focal deficit present.     Mental Status: She is alert. Mental status is at baseline.  Psychiatric:        Attention and Perception: Attention and perception normal.        Mood and Affect: Mood is anxious and depressed.        Behavior: Behavior normal.        Cognition and Memory: Cognition normal.    Lab Results  Component Value Date   WBC 8.9 12/03/2020   HGB 12.6 12/03/2020   HCT 38.1 12/03/2020   PLT 390.0 12/03/2020   GLUCOSE 105  (H) 10/01/2021   CHOL 157 06/30/2021   TRIG 84.0 06/30/2021   HDL 51.00 06/30/2021   LDLCALC 90 06/30/2021   ALT 29 10/01/2021   AST 27 10/01/2021   NA 142 10/01/2021   K 4.0 10/01/2021   CL 107 10/01/2021   CREATININE 0.82 10/01/2021   BUN 10 10/01/2021   CO2 30 10/01/2021  TSH 1.04 10/01/2021   INR 1.0 12/03/2020   HGBA1C 5.9 06/30/2021   MICROALBUR 0.9 04/24/2015    US THYROID  Result Date: 04/08/2021 CLINICAL DATA:  Right thyroid nodule EXAM: THYROID ULTRASOUND TECHNIQUE: Ultrasound examination of the thyroid gland and adjacent soft tissues was performed. COMPARISON:  06/26/2015 08/05/2015 FINDINGS: Parenchymal Echotexture: Mildly heterogeneous Isthmus: 0.4 cm Right lobe: 5.6 x 2.9 x 4.0 cm Left lobe: 4.5 x 1.3 x 1.6 cm _________________________________________________________ Estimated total number of nodules >/= 1 cm: 2 Number of spongiform nodules >/=  2 cm not described below (TR1): 0 Number of mixed cystic and solid nodules >/= 1.5 cm not described below ([place]): 0 _________________________________________________________ Nodule # 1: Prior biopsy: Yes Location: Right; mid Maximum size: 5.4 cm; Other 2 dimensions: 2.6 x 3.9 cm, previously, 3.8 x 2.0 x 2.0 cm Composition: solid/almost completely solid (2) Echogenicity: isoechoic (1) Shape: not taller-than-wide (0) Margins: smooth (0) Echogenic foci: none (0) ACR TI-RADS total points: 3. ACR TI-RADS risk category:  TR3 (3 points). Significant change in size (>/= 20% in two dimensions and minimal increase of 2 mm): Yes Change in features: No Change in ACR TI-RADS risk category: No ACR TI-RADS recommendations: **Given size (>/= 2.5 cm) and appearance, fine needle aspiration of this mildly suspicious nodule should be considered based on TI-RADS criteria. _________________________________________________________ Nodule # 2: Prior biopsy: No Location: Left; superior Maximum size: 1.1 cm; Other 2 dimensions: 0.9 x 0.7 cm, previously, 1.1 x 0.7 x  0.7 cm Composition: solid/almost completely solid (2) Echogenicity: hypoechoic (2) Shape: taller-than-wide (3) Margins: smooth (0) Echogenic foci: none (0) ACR TI-RADS total points: 7. ACR TI-RADS risk category:  TR5 (>/= 7 points). Significant change in size (>/= 20% in two dimensions and minimal increase of 2 mm): No Change in features: No Change in ACR TI-RADS risk category: Yes ACR TI-RADS recommendations: Given stability of size since 2016, this is most likely a benign nodule. _________________________________________________________ IMPRESSION: Nodule 1 (TI-RADS 3) located in the mid right thyroid lobe demonstrates significant interval growth. Prior FNA of this nodule was performed on 08/05/2015. Repeat FNA should be considered given interval growth. The above is in keeping with the ACR TI-RADS recommendations - J Am Coll Radiol 2017;14:587-595. Electronically Signed   By: Miachel Roux M.D.   On: 04/08/2021 07:56    Assessment & Plan:   Ryian was seen today for depression.  Diagnoses and all orders for this visit:  Nodule of right lobe of thyroid gland- She is euthyroid. Will recheck the U/S. Will refer to ENT. -     US THYROID; Future -     Thyroxine binding globulin; Future -     TSH; Future -     TSH -     Thyroxine binding globulin  Moderate episode of recurrent major depressive disorder (HCC) -     DULoxetine (CYMBALTA) 30 MG capsule; Take 1 capsule (30 mg total) by mouth daily.  Hemiplegic migraine without status migrainosus, not intractable -     almotriptan (AXERT) 12.5 MG tablet; Take 1 tablet (12.5 mg total) by mouth daily as needed for migraine.  Primary hypertension- Her BP is well controlled. -     Basic metabolic panel; Future -     Basic metabolic panel  NASH (nonalcoholic steatohepatitis)- LFT's are normal. -     Hepatic function panel; Future -     Hepatic function panel   I have discontinued Connye Burkitt. Wawrzyniak's aspirin and escitalopram. I have also changed her  almotriptan.  Additionally, I am having her start on DULoxetine. Lastly, I am having her maintain her Multiple Vitamins-Minerals (MULTIVITAMIN & MINERAL PO), ipratropium, gabapentin, blood glucose meter kit and supplies, famotidine, OneTouch Ultra, Qvar RediHaler, traZODone, temazepam, traMADol, donepezil, memantine, atorvastatin, and [place]-Con M20.  Meds ordered this encounter  Medications   almotriptan (AXERT) 12.5 MG tablet    Sig: Take 1 tablet (12.5 mg total) by mouth daily as needed for migraine.    Dispense:  10 tablet    Refill:  4   DULoxetine (CYMBALTA) 30 MG capsule    Sig: Take 1 capsule (30 mg total) by mouth daily.    Dispense:  30 capsule    Refill:  0     Follow-up: Return in about 3 months (around 12/29/2021).  Scarlette Calico, MD

## 2021-10-03 LAB — THYROXINE BINDING GLOBULIN: TBG: 20.8 ug/mL (ref 13.5–30.9)

## 2021-10-15 ENCOUNTER — Telehealth: Payer: Self-pay | Admitting: Internal Medicine

## 2021-10-15 ENCOUNTER — Encounter: Payer: Self-pay | Admitting: Internal Medicine

## 2021-10-15 NOTE — Telephone Encounter (Signed)
Saw pt yesterday and is wanting [place] to reach out to pt regarding alternative for DULoxetine (CYMBALTA) 30 MG capsule. States pt has had a reaction.  ? ? ?Please call pt to change medication.  ?

## 2021-10-15 NOTE — Telephone Encounter (Signed)
Note not needed 

## 2021-10-23 ENCOUNTER — Other Ambulatory Visit: Payer: Self-pay | Admitting: Internal Medicine

## 2021-10-23 DIAGNOSIS — F331 Major depressive disorder, recurrent, moderate: Secondary | ICD-10-CM

## 2021-10-23 MED ORDER — DULOXETINE HCL 60 MG PO CPEP: 60.0000 mg | ORAL_CAPSULE | Freq: Every day | ORAL | 1 refills | Status: DC

## 2021-12-05 ENCOUNTER — Other Ambulatory Visit: Payer: Self-pay | Admitting: Internal Medicine

## 2021-12-05 DIAGNOSIS — F331 Major depressive disorder, recurrent, moderate: Secondary | ICD-10-CM

## 2021-12-23 ENCOUNTER — Other Ambulatory Visit: Payer: Self-pay | Admitting: Internal Medicine

## 2021-12-23 DIAGNOSIS — F331 Major depressive disorder, recurrent, moderate: Secondary | ICD-10-CM

## 2022-01-26 ENCOUNTER — Ambulatory Visit: Payer: Medicare Other | Admitting: Internal Medicine

## 2022-01-29 ENCOUNTER — Other Ambulatory Visit: Payer: Self-pay | Admitting: Internal Medicine

## 2022-01-29 DIAGNOSIS — G2581 Restless legs syndrome: Secondary | ICD-10-CM

## 2022-02-10 ENCOUNTER — Ambulatory Visit: Payer: Medicare Other | Admitting: Internal Medicine

## 2022-03-11 ENCOUNTER — Ambulatory Visit: Payer: Medicare Other | Admitting: Internal Medicine

## 2022-04-15 ENCOUNTER — Ambulatory Visit: Payer: Medicare Other | Admitting: Internal Medicine

## 2022-04-26 ENCOUNTER — Encounter: Payer: Self-pay | Admitting: Internal Medicine

## 2022-04-26 ENCOUNTER — Ambulatory Visit (INDEPENDENT_AMBULATORY_CARE_PROVIDER_SITE_OTHER): Payer: Medicare Other | Admitting: Internal Medicine

## 2022-04-26 VITALS — BP 128/72 | HR 80 | Temp 98.5°F | Ht 59.0 in | Wt 158.0 lb

## 2022-04-26 DIAGNOSIS — Z23 Encounter for immunization: Secondary | ICD-10-CM

## 2022-04-26 DIAGNOSIS — E118 Type 2 diabetes mellitus with unspecified complications: Secondary | ICD-10-CM | POA: Diagnosis not present

## 2022-04-26 DIAGNOSIS — E041 Nontoxic single thyroid nodule: Secondary | ICD-10-CM | POA: Diagnosis not present

## 2022-04-26 DIAGNOSIS — J301 Allergic rhinitis due to pollen: Secondary | ICD-10-CM

## 2022-04-26 DIAGNOSIS — N1831 Chronic kidney disease, stage 3a: Secondary | ICD-10-CM

## 2022-04-26 DIAGNOSIS — R220 Localized swelling, mass and lump, head: Secondary | ICD-10-CM | POA: Diagnosis not present

## 2022-04-26 DIAGNOSIS — E785 Hyperlipidemia, unspecified: Secondary | ICD-10-CM

## 2022-04-26 DIAGNOSIS — I10 Essential (primary) hypertension: Secondary | ICD-10-CM

## 2022-04-26 NOTE — Patient Instructions (Signed)
Goiter  A goiter is an enlarged thyroid gland. The thyroid gland is located in the lower front part of the neck, just in front of the windpipe (trachea). This gland makes hormones that affect how the body processes food for energy (metabolism) and how the heart and brain function. Most goiters are painless and are not a cause for concern. Some goiters can affect the way your thyroid makes thyroid hormones. Goiters and conditions that cause goiters can be treated, if necessary. What are the causes? This condition may be caused by: Lack of a mineral called iodine. The thyroid gland uses iodine to make thyroid hormones. Diseases that attack healthy cells in the body (autoimmune diseases) and affect thyroid function, such as Graves' disease or Hashimoto's disease. These diseases may cause the body to produce too much thyroid hormone (hyperthyroidism) or too little of the hormone (hypothyroidism). Conditions that cause inflammation of the thyroid (thyroiditis). One or more small growths on the thyroid (nodular goiter). Other causes may include: Medical problems caused by abnormal genes that are passed from parent to child (genetic defects). Thyroid injury or infection. Tumors that may or may not be cancerous. Pregnancy. Certain medicines. Exposure to radiation. In some cases, the cause may not be known. What increases the risk? The following factors may make you more likely to develop this condition: You do not get enough iodine in your diet. You have a family history of goiter. You are female. You are older than age 40. You smoke tobacco. You have had exposure to radiation. What are the signs or symptoms? The main symptom of this condition is swelling in the lower, front part of the neck. This swelling can range from a very small bump to a large lump. Other symptoms may include: A tight feeling in the throat. A hoarse voice. Coughing. Wheezing. Difficulty swallowing or breathing. Bulging  veins in the neck. Dizziness. When a goiter is the result of an overactive thyroid (hyperthyroidism), symptoms may also include: Nervousness or restlessness. Inability to tolerate heat. Unexplained weight loss. Diarrhea. Changes in heartbeat, such as skipped beats, extra beats, or a rapid heart rate. Loss of menstruation. Increased appetite. Sleep problems. When a goiter is the result of an underactive thyroid (hypothyroidism), symptoms may also include: Feeling tired (fatigue). Inability to tolerate cold. Weight gain that is not explained by a change in diet or exercise habits. Dry skin or coarse hair. Irregular menstrual periods. Constipation. Sadness or depression. In some cases, there may not be any symptoms. How is this diagnosed? This condition may be diagnosed based on your symptoms, your medical history, and a physical exam. You may have tests, such as: Blood tests to check thyroid function. Imaging tests, such as: Ultrasound. CT scan. MRI. Thyroid scan. Removal of a tissue sample (biopsy) of the goiter or any nodules. The sample will be tested to check for cancer. How is this treated? Treatment for this condition depends on the cause and your symptoms. Treatment may include: Medicines to regulate thyroid hormone levels. Anti-inflammatory medicines or steroid medicines, if the goiter is caused by inflammation. Iodine supplements or changes to your diet, if the goiter is caused by iodine deficiency. Radioactive iodine treatment. Surgery to remove your thyroid. In some cases, you may only need regular check-ups with your health care provider to monitor your condition, and you may not need treatment. Follow these instructions at home: Follow instructions from your health care provider about any changes to your diet. Take over-the-counter and prescription medicines only as told   by your health care provider. These include supplements. Do not use any products that contain  nicotine or tobacco. These products include cigarettes, chewing tobacco, and vaping devices, such as e-cigarettes. If you need help quitting, ask your health care provider. Keep all follow-up visits. Your health care provider will want to repeat blood tests to check thyroid function. Where to find more information American Thyroid Association: thyroid.org Endocrine Society: endocrine.org Contact a health care provider if: Your symptoms do not get better with treatment. You have nausea, vomiting, or diarrhea. You have a fever. You suddenly become very weak. You experience extreme restlessness. Get help right away if: You have sudden, unexplained confusion or other mental changes. You have chest pain. You have trouble breathing or swallowing. You have fast or irregular heartbeats (palpitations). These symptoms may be an emergency. Get help right away. Call 911. Do not wait to see if the symptoms will go away. Do not drive yourself to the hospital. Summary A goiter is an enlarged thyroid gland. The thyroid gland is located in the lower front part of the neck, just in front of the windpipe. The main symptom of this condition is swelling in the lower, front part of the neck. This swelling can range from a very small bump to a large lump. Treatment for this condition depends on the cause and your symptoms. You may need medicines, supplements, or regular monitoring of your condition. This information is not intended to replace advice given to you by your health care provider. Make sure you discuss any questions you have with your health care provider. Document Revised: 09/18/2021 Document Reviewed: 09/18/2021 Elsevier Patient Education  2023 [employer]  

## 2022-04-26 NOTE — Progress Notes (Unsigned)
Subjective:  Patient ID: Meghan Hickman, female    DOB: 12/20/1950  Age: 71 y.o. MRN: [id number]  CC: No chief complaint on file.   HPI Meghan Hickman presents for f/up -  She can not inform me whether or not she has seen an ENT doctor and had surgery on her thyroid nodule.  She complains of a painful lump on the top of her scalp.  She also complains that her blood sugar has been elevated.  Outpatient Medications Prior to Visit  Medication Sig Dispense Refill   almotriptan (AXERT) 12.5 MG tablet Take 1 tablet (12.5 mg total) by mouth daily as needed for migraine. 10 tablet 4   atorvastatin (LIPITOR) 20 MG tablet TAKE 1 TABLET BY MOUTH EVERY DAY 90 tablet 0   beclomethasone (QVAR REDIHALER) 80 MCG/ACT inhaler Inhale 1 puff into the lungs 2 (two) times daily. 1 each 11   blood glucose meter kit and supplies Dispense based on patient and insurance preference. Use up to three times daily as directed. (FOR ICD-10 E11.65). Dispense 100 lancets and test strips and 1 lancing device to match meter. 1 each 0   donepezil (ARICEPT) 10 MG tablet Take 1 tablet (10 mg total) by mouth at bedtime. 90 tablet 4   DULoxetine (CYMBALTA) 60 MG capsule Take 1 capsule (60 mg total) by mouth daily. 90 capsule 1   famotidine (PEPCID) 40 MG tablet Take 1 tablet (40 mg total) by mouth daily. 90 tablet 3   gabapentin (NEURONTIN) 300 MG capsule TAKE 1 CAPSULE BY MOUTH THREE TIMES A DAY 270 capsule 0   ipratropium (ATROVENT) 0.03 % nasal spray Place 2 sprays into both nostrils 2 (two) times daily. 30 mL 0   KLOR-CON M20 20 MEQ tablet TAKE 1 TABLET BY MOUTH TWICE A DAY 180 tablet 0   memantine (NAMENDA) 10 MG tablet TAKE 1 TABLET BY MOUTH TWICE A DAY 180 tablet 4   Multiple Vitamins-Minerals (MULTIVITAMIN & MINERAL PO) Take 1 tablet by mouth daily.     ONETOUCH ULTRA test strip USE UP TO 3 TIMES A DAY AS DIRECTED 100 strip 2   temazepam (RESTORIL) 30 MG capsule TAKE 1 CAPSULE BY MOUTH AT BEDTIME AS NEEDED FOR SLEEP  30 capsule 1   traMADol (ULTRAM) 50 MG tablet Take 1 tablet (50 mg total) by mouth every 6 (six) hours as needed. 65 tablet 2   traZODone (DESYREL) 50 MG tablet TAKE 1/2 TO 1 TABLET BY MOUTH AT BEDTIME AS NEEDED FOR SLEEP 90 tablet 2   No facility-administered medications prior to visit.    ROS Review of Systems  Constitutional: Negative.  Negative for chills, diaphoresis, fatigue and fever.  HENT:  Positive for postnasal drip and rhinorrhea. Negative for nosebleeds and sinus pressure.   Eyes: Negative.   Respiratory: Negative.  Negative for cough, shortness of breath and wheezing.   Cardiovascular:  Negative for chest pain, palpitations and leg swelling.  Gastrointestinal:  Negative for abdominal pain, constipation, diarrhea, nausea and vomiting.  Endocrine: Negative.   Genitourinary: Negative.  Negative for difficulty urinating.  Musculoskeletal: Negative.   Skin: Negative.   Neurological:  Negative for dizziness, weakness and headaches.  Hematological:  Negative for adenopathy. Does not bruise/bleed easily.  Psychiatric/Behavioral: Negative.      Objective:  BP 128/72 (BP Location: Left Arm, Patient Position: Sitting, Cuff Size: Normal)   Pulse 80   Temp 98.5 F (36.9 C) (Oral)   Ht 4' 11"  (1.499 m)   Wt 158  lb (71.7 kg)   SpO2 98%   BMI 31.91 kg/m   BP Readings from Last 3 Encounters:  04/26/22 128/72  10/01/21 116/68  06/30/21 118/70    Wt Readings from Last 3 Encounters:  04/26/22 158 lb (71.7 kg)  10/01/21 158 lb (71.7 kg)  06/30/21 156 lb 3.2 oz (70.9 kg)    Physical Exam Vitals reviewed.  HENT:     Head:      Nose: Nose normal.     Mouth/Throat:     Mouth: Mucous membranes are moist.  Eyes:     General: No scleral icterus.    Conjunctiva/sclera: Conjunctivae normal.  Neck:     Thyroid: Thyromegaly present.     Comments: Right thyroid nodule Cardiovascular:     Rate and Rhythm: Normal rate and regular rhythm.     Heart sounds: No murmur  heard. Pulmonary:     Effort: Pulmonary effort is normal.     Breath sounds: No stridor. No wheezing, rhonchi or rales.  Abdominal:     General: Abdomen is flat.     Palpations: There is no mass.     Tenderness: There is no abdominal tenderness. There is no guarding.     Hernia: No hernia is present.  Musculoskeletal:        General: Normal range of motion.     Cervical back: Neck supple.     Right lower leg: No edema.     Left lower leg: No edema.  Lymphadenopathy:     Cervical: No cervical adenopathy.  Skin:    General: Skin is warm and dry.     Findings: No rash.  Neurological:     General: No focal deficit present.     Mental Status: She is alert.  Psychiatric:        Mood and Affect: Mood normal.        Behavior: Behavior normal.     Lab Results  Component Value Date   WBC 7.9 04/26/2022   HGB 13.1 04/26/2022   HCT 40.1 04/26/2022   PLT 358.0 04/26/2022   GLUCOSE 162 (H) 04/26/2022   CHOL 157 06/30/2021   TRIG 84.0 06/30/2021   HDL 51.00 06/30/2021   LDLCALC 90 06/30/2021   ALT 29 10/01/2021   AST 27 10/01/2021   NA 137 04/26/2022   K 3.8 04/26/2022   CL 100 04/26/2022   CREATININE 0.96 04/26/2022   BUN 9 04/26/2022   CO2 28 04/26/2022   TSH 1.18 04/26/2022   INR 1.0 12/03/2020   HGBA1C 7.7 (H) 04/26/2022   MICROALBUR 0.9 04/24/2015    US THYROID  Result Date: 04/08/2021 CLINICAL DATA:  Right thyroid nodule EXAM: THYROID ULTRASOUND TECHNIQUE: Ultrasound examination of the thyroid gland and adjacent soft tissues was performed. COMPARISON:  06/26/2015 08/05/2015 FINDINGS: Parenchymal Echotexture: Mildly heterogeneous Isthmus: 0.4 cm Right lobe: 5.6 x 2.9 x 4.0 cm Left lobe: 4.5 x 1.3 x 1.6 cm _________________________________________________________ Estimated total number of nodules >/= 1 cm: 2 Number of spongiform nodules >/=  2 cm not described below (TR1): 0 Number of mixed cystic and solid nodules >/= 1.5 cm not described below ([place]): 0  _________________________________________________________ Nodule # 1: Prior biopsy: Yes Location: Right; mid Maximum size: 5.4 cm; Other 2 dimensions: 2.6 x 3.9 cm, previously, 3.8 x 2.0 x 2.0 cm Composition: solid/almost completely solid (2) Echogenicity: isoechoic (1) Shape: not taller-than-wide (0) Margins: smooth (0) Echogenic foci: none (0) ACR TI-RADS total points: 3. ACR TI-RADS risk category:  TR3 (  3 points). Significant change in size (>/= 20% in two dimensions and minimal increase of 2 mm): Yes Change in features: No Change in ACR TI-RADS risk category: No ACR TI-RADS recommendations: **Given size (>/= 2.5 cm) and appearance, fine needle aspiration of this mildly suspicious nodule should be considered based on TI-RADS criteria. _________________________________________________________ Nodule # 2: Prior biopsy: No Location: Left; superior Maximum size: 1.1 cm; Other 2 dimensions: 0.9 x 0.7 cm, previously, 1.1 x 0.7 x 0.7 cm Composition: solid/almost completely solid (2) Echogenicity: hypoechoic (2) Shape: taller-than-wide (3) Margins: smooth (0) Echogenic foci: none (0) ACR TI-RADS total points: 7. ACR TI-RADS risk category:  TR5 (>/= 7 points). Significant change in size (>/= 20% in two dimensions and minimal increase of 2 mm): No Change in features: No Change in ACR TI-RADS risk category: Yes ACR TI-RADS recommendations: Given stability of size since 2016, this is most likely a benign nodule. _________________________________________________________ IMPRESSION: Nodule 1 (TI-RADS 3) located in the mid right thyroid lobe demonstrates significant interval growth. Prior FNA of this nodule was performed on 08/05/2015. Repeat FNA should be considered given interval growth. The above is in keeping with the ACR TI-RADS recommendations - J Am Coll Radiol 2017;14:587-595. Electronically Signed   By: Miachel Roux M.D.   On: 04/08/2021 07:56    Assessment & Plan:   Diagnoses and all orders for this visit:  Flu  vaccine need -     Flu Vaccine QUAD High Dose(Fluad)  Scalp mass -     Ambulatory referral to Plastic Surgery  Nodule of right lobe of thyroid gland -     US THYROID; Future  Primary hypertension- Her blood pressure is adequately well controlled. -     TSH; Future -     CBC with Differential/Platelet; Future -     CBC with Differential/Platelet -     TSH  Hyperlipidemia with target LDL less than 130 -     TSH; Future -     TSH  Type II diabetes mellitus with manifestations ([place])- Her A1c is up to 7.7% and she has developed renal insufficiency.  Will start an SGLT2 inhibitor. -     Basic metabolic panel; Future -     Hemoglobin A1c; Future -     Hemoglobin A1c -     Basic metabolic panel -     dapagliflozin propanediol (FARXIGA) 10 MG TABS tablet; Take 1 tablet (10 mg total) by mouth daily before breakfast.  Stage 3a chronic kidney disease (HCC) -     dapagliflozin propanediol (FARXIGA) 10 MG TABS tablet; Take 1 tablet (10 mg total) by mouth daily before breakfast.  Seasonal allergic rhinitis due to pollen -     levocetirizine (XYZAL) 5 MG tablet; Take 1 tablet (5 mg total) by mouth every evening.  Other orders -     Zoster Vaccine Adjuvanted [facility]) injection; Inject 0.5 mLs into the muscle once for 1 dose. -     Tdap (BOOSTRIX) 5-2.5-18.5 LF-MCG/0.5 injection; Inject 0.5 mLs into the muscle once for 1 dose.   I am having Connye Burkitt. Meloy start on dapagliflozin propanediol, Shingrix, Boostrix, and levocetirizine. I am also having her maintain her Multiple Vitamins-Minerals (MULTIVITAMIN & MINERAL PO), ipratropium, gabapentin, blood glucose meter kit and supplies, famotidine, OneTouch Ultra, Qvar RediHaler, traZODone, traMADol, donepezil, memantine, atorvastatin, Klor-Con M20, almotriptan, DULoxetine, and temazepam.  Meds ordered this encounter  Medications   dapagliflozin propanediol (FARXIGA) 10 MG TABS tablet    Sig: Take 1 tablet (10 mg  total) by mouth daily before  breakfast.    Dispense:  90 tablet    Refill:  1   Zoster Vaccine Adjuvanted [facility]) injection    Sig: Inject 0.5 mLs into the muscle once for 1 dose.    Dispense:  0.5 mL    Refill:  1   Tdap (BOOSTRIX) 5-2.5-18.5 LF-MCG/0.5 injection    Sig: Inject 0.5 mLs into the muscle once for 1 dose.    Dispense:  0.5 mL    Refill:  0   levocetirizine (XYZAL) 5 MG tablet    Sig: Take 1 tablet (5 mg total) by mouth every evening.    Dispense:  90 tablet    Refill:  1     Follow-up: Return in about 3 months (around 07/26/2022).  Scarlette Calico, MD

## 2022-04-27 DIAGNOSIS — N1831 Chronic kidney disease, stage 3a: Secondary | ICD-10-CM | POA: Insufficient documentation

## 2022-04-27 LAB — BASIC METABOLIC PANEL
BUN: 9 mg/dL (ref 6–23)
CO2: 28 mEq/L (ref 19–32)
Calcium: 9.8 mg/dL (ref 8.4–10.5)
Chloride: 100 mEq/L (ref 96–112)
Creatinine, Ser: 0.96 mg/dL (ref 0.40–1.20)
GFR: 59.25 mL/min — ABNORMAL LOW (ref >60.00)
Glucose, Bld: 162 mg/dL — ABNORMAL HIGH (ref 70–99)
Potassium: 3.8 mEq/L (ref 3.5–5.1)
Sodium: 137 mEq/L (ref 135–145)

## 2022-04-27 LAB — CBC WITH DIFFERENTIAL/PLATELET
Basophils Absolute: 0.1 10*3/uL (ref 0.0–0.1)
Basophils Relative: 1.4 % (ref 0.0–3.0)
Eosinophils Absolute: 0.3 10*3/uL (ref 0.0–0.7)
Eosinophils Relative: 3.4 % (ref 0.0–5.0)
HCT: 40.1 % (ref 36.0–46.0)
Hemoglobin: 13.1 g/dL (ref 12.0–15.0)
Lymphocytes Relative: 23.3 % (ref 12.0–46.0)
Lymphs Abs: 1.8 10*3/uL (ref 0.7–4.0)
MCHC: 32.7 g/dL (ref 30.0–36.0)
MCV: 82.9 fl (ref 78.0–100.0)
Monocytes Absolute: 0.5 10*3/uL (ref 0.1–1.0)
Monocytes Relative: 6 % (ref 3.0–12.0)
Neutro Abs: 5.2 10*3/uL (ref 1.4–7.7)
Neutrophils Relative %: 65.9 % (ref 43.0–77.0)
Platelets: 358 10*3/uL (ref 150.0–400.0)
RBC: 4.84 Mil/uL (ref 3.87–5.11)
RDW: 13.8 % (ref 11.5–15.5)
WBC: 7.9 10*3/uL (ref 4.0–10.5)

## 2022-04-27 LAB — TSH: TSH: 1.18 u[IU]/mL (ref 0.35–5.50)

## 2022-04-27 LAB — HEMOGLOBIN A1C: Hgb A1c MFr Bld: 7.7 % — ABNORMAL HIGH (ref 4.6–6.5)

## 2022-04-27 MED ORDER — DAPAGLIFLOZIN PROPANEDIOL 10 MG PO TABS: 10.0000 mg | ORAL_TABLET | Freq: Every day | ORAL | 1 refills | Status: DC

## 2022-04-28 MED ORDER — LEVOCETIRIZINE DIHYDROCHLORIDE 5 MG PO TABS: 5.0000 mg | ORAL_TABLET | Freq: Every evening | ORAL | 1 refills | Status: DC

## 2022-04-28 MED ORDER — SHINGRIX 50 MCG/0.5ML IM SUSR: 0.5000 mL | Freq: Once | INTRAMUSCULAR | 1 refills | Status: AC

## 2022-04-28 MED ORDER — BOOSTRIX 5-2.5-18.5 LF-MCG/0.5 IM SUSP: 0.5000 mL | Freq: Once | INTRAMUSCULAR | 0 refills | Status: AC

## 2022-05-05 ENCOUNTER — Other Ambulatory Visit: Payer: Self-pay | Admitting: Internal Medicine

## 2022-05-05 DIAGNOSIS — G894 Chronic pain syndrome: Secondary | ICD-10-CM

## 2022-05-05 DIAGNOSIS — G2581 Restless legs syndrome: Secondary | ICD-10-CM

## 2022-05-10 ENCOUNTER — Telehealth: Payer: Self-pay | Admitting: Internal Medicine

## 2022-05-10 NOTE — Telephone Encounter (Signed)
Left message for patient to call back and schedule Medicare Annual Wellness Visit (AWV).   Please offer to do virtually or by telephone.  Left office number and my jabber #[phone].  AWVI eligible as of 01/08/2020  Please schedule at anytime with Nurse Health Advisor.   

## 2022-05-14 ENCOUNTER — Ambulatory Visit
Admission: RE | Admit: 2022-05-14 | Discharge: 2022-05-14 | Disposition: A | Discharge status: Home / Self Care | Payer: Medicare Other | Source: Ambulatory Visit | Attending: Internal Medicine | Admitting: Internal Medicine

## 2022-05-14 DIAGNOSIS — E041 Nontoxic single thyroid nodule: Secondary | ICD-10-CM

## 2022-05-16 ENCOUNTER — Encounter: Payer: Self-pay | Admitting: Internal Medicine

## 2022-05-19 ENCOUNTER — Other Ambulatory Visit: Payer: Self-pay | Admitting: Internal Medicine

## 2022-05-19 DIAGNOSIS — E785 Hyperlipidemia, unspecified: Secondary | ICD-10-CM

## 2022-05-19 DIAGNOSIS — F331 Major depressive disorder, recurrent, moderate: Secondary | ICD-10-CM

## 2022-05-19 DIAGNOSIS — E118 Type 2 diabetes mellitus with unspecified complications: Secondary | ICD-10-CM

## 2022-05-19 DIAGNOSIS — E876 Hypokalemia: Secondary | ICD-10-CM

## 2022-05-19 DIAGNOSIS — I10 Essential (primary) hypertension: Secondary | ICD-10-CM

## 2022-05-19 MED ORDER — DULOXETINE HCL 60 MG PO CPEP: 60.0000 mg | ORAL_CAPSULE | Freq: Every day | ORAL | 1 refills | Status: DC

## 2022-05-19 MED ORDER — ATORVASTATIN CALCIUM 20 MG PO TABS: 20.0000 mg | ORAL_TABLET | Freq: Every day | ORAL | 0 refills | Status: DC

## 2022-05-19 MED ORDER — POTASSIUM CHLORIDE CRYS ER 20 MEQ PO TBCR: 20.0000 meq | EXTENDED_RELEASE_TABLET | Freq: Two times a day (BID) | ORAL | 0 refills | Status: DC

## 2022-07-08 LAB — HM DIABETES EYE EXAM

## 2022-07-26 ENCOUNTER — Ambulatory Visit (INDEPENDENT_AMBULATORY_CARE_PROVIDER_SITE_OTHER): Payer: Medicare Other | Admitting: Internal Medicine

## 2022-07-26 ENCOUNTER — Encounter: Payer: Self-pay | Admitting: Internal Medicine

## 2022-07-26 VITALS — BP 132/90 | HR 112 | Temp 98.2°F | Ht 59.0 in | Wt 158.8 lb

## 2022-07-26 DIAGNOSIS — E785 Hyperlipidemia, unspecified: Secondary | ICD-10-CM

## 2022-07-26 DIAGNOSIS — K7581 Nonalcoholic steatohepatitis (NASH): Secondary | ICD-10-CM | POA: Diagnosis not present

## 2022-07-26 DIAGNOSIS — E118 Type 2 diabetes mellitus with unspecified complications: Secondary | ICD-10-CM | POA: Diagnosis not present

## 2022-07-26 DIAGNOSIS — I10 Essential (primary) hypertension: Secondary | ICD-10-CM

## 2022-07-26 DIAGNOSIS — N1831 Chronic kidney disease, stage 3a: Secondary | ICD-10-CM | POA: Diagnosis not present

## 2022-07-26 DIAGNOSIS — R000 Tachycardia, unspecified: Secondary | ICD-10-CM | POA: Diagnosis not present

## 2022-07-26 NOTE — Patient Instructions (Signed)
Sinus Tachycardia  Sinus tachycardia is a fast heartbeat. In sinus tachycardia, the heart beats more than 100 times a minute. Sinus tachycardia starts in the part of the heart called the sinoatrial (SA) node. Sinus tachycardia may be harmless, or it may be a sign of a serious condition. What are the causes? This condition may be caused by: Exercise or exertion. A fever. Pain. Loss of body fluids (dehydration). Severe bleeding (hemorrhage). Anxiety and stress. Certain substances, including: Alcohol. Caffeine. Tobacco and nicotine products. Cold medicines. Illegal drugs. Medical conditions including: Heart disease. An infection. An overactive thyroid (hyperthyroidism). A lack of red blood cells (anemia). What are the signs or symptoms? Symptoms of this condition include: A feeling that the heart is beating fast or unevenly (palpitations). Suddenly noticing your heartbeat (cardiac awareness). Lightheadedness. Tiredness (fatigue). Shortness of breath. Chest pain. Nausea. Fainting. How is this diagnosed? This condition is diagnosed with: A physical exam. Tests or monitoring, such as: Blood tests. An electrocardiogram (ECG). This test measures the electrical activity of the heart. Ambulatory cardiac monitor. This records your heartbeats for 24 hours or more. You may be referred to a heart specialist (cardiologist). How is this treated? Treatment for this condition depends on the cause. Treatment may involve: Treating the underlying condition. Taking new medicines or changing your current medicines as told by your health care provider. Making changes to your diet or lifestyle. Follow these instructions at home: Lifestyle  Do not use any products that contain nicotine or tobacco. These products include cigarettes, chewing tobacco, and vaping devices, such as e-cigarettes. If you need help quitting, ask your health care provider. Do not use illegal drugs, such as  cocaine. Learn relaxation methods to help you when you get stressed or anxious. These include deep breathing. Avoid caffeine or other stimulants, including herbal stimulants that are found in energy drinks. Alcohol use  Do not drink alcohol if: Your health care provider tells you not to drink. You are pregnant, may be pregnant, or are planning to become pregnant. If you drink alcohol: Limit how much you have to: 0-1 drink a day for women. 0-2 drinks a day for men. Know how much alcohol is in your drink. In the [place] one drink equals one 12 oz bottle of beer (355 mL), one 5 oz glass of wine (148 mL), or one 1 oz glass of hard liquor (44 mL). General instructions Drink enough fluids to keep your urine pale yellow. Take over-the-counter and prescription medicines only as told by your health care provider. Ask your health care provider about taking vitamins, herbs, and supplements. Contact a health care provider if: You have vomiting or diarrhea that does not go away. You have a fever. You have weakness or dizziness. You feel faint. Get help right away if: You have pain in your chest, upper arms, jaw, or neck. You have palpitations that do not go away. Summary In sinus tachycardia, the heart beats more than 100 times a minute. Sinus tachycardia may be harmless, or it may be a sign of a serious condition. Treatment for this condition depends on the cause or the underlying condition. Get help right away if you have pain in your chest, upper arms, jaw, or neck. This information is not intended to replace advice given to you by your health care provider. Make sure you discuss any questions you have with your health care provider. Document Revised: 11/24/2021 Document Reviewed: 11/24/2021 Elsevier Patient Education  2023 Elsevier Inc.  

## 2022-07-26 NOTE — Progress Notes (Unsigned)
Subjective:  Patient ID: Meghan Hickman, female    DOB: 08-17-1950  Age: 71 y.o. MRN: [id number]  CC: No chief complaint on file.   HPI KIRA HARTL presents for ***  Outpatient Medications Prior to Visit  Medication Sig Dispense Refill   almotriptan (AXERT) 12.5 MG tablet Take 1 tablet (12.5 mg total) by mouth daily as needed for migraine. 10 tablet 4   ALPRAZolam (XANAX) 1 MG tablet      aspirin 81 MG chewable tablet Chew by mouth.     atorvastatin (LIPITOR) 20 MG tablet Take 1 tablet (20 mg total) by mouth daily. 90 tablet 0   beclomethasone (QVAR REDIHALER) 80 MCG/ACT inhaler Inhale 1 puff into the lungs 2 (two) times daily. 1 each 11   blood glucose meter kit and supplies Dispense based on patient and insurance preference. Use up to three times daily as directed. (FOR ICD-10 E11.65). Dispense 100 lancets and test strips and 1 lancing device to match meter. 1 each 0   dapagliflozin propanediol (FARXIGA) 10 MG TABS tablet Take 1 tablet (10 mg total) by mouth daily before breakfast. 90 tablet 1   donepezil (ARICEPT) 10 MG tablet Take 1 tablet (10 mg total) by mouth at bedtime. 90 tablet 4   DULoxetine (CYMBALTA) 60 MG capsule Take 1 capsule (60 mg total) by mouth daily. 90 capsule 1   famotidine (PEPCID) 40 MG tablet Take 1 tablet (40 mg total) by mouth daily. 90 tablet 3   gabapentin (NEURONTIN) 300 MG capsule TAKE 1 CAPSULE BY MOUTH THREE TIMES A DAY 270 capsule 0   ipratropium (ATROVENT) 0.03 % nasal spray Place 2 sprays into both nostrils 2 (two) times daily. 30 mL 0   levocetirizine (XYZAL) 5 MG tablet Take 1 tablet (5 mg total) by mouth every evening. 90 tablet 1   memantine (NAMENDA) 10 MG tablet TAKE 1 TABLET BY MOUTH TWICE A DAY 180 tablet 4   Multiple Vitamins-Minerals (MULTIVITAMIN & MINERAL PO) Take 1 tablet by mouth daily.     ONETOUCH ULTRA test strip USE UP TO 3 TIMES A DAY AS DIRECTED 100 strip 2   potassium chloride SA (KLOR-CON M20) 20 MEQ tablet Take 1 tablet  (20 mEq total) by mouth 2 (two) times daily. 180 tablet 0   temazepam (RESTORIL) 30 MG capsule TAKE 1 CAPSULE BY MOUTH AT BEDTIME AS NEEDED FOR SLEEP 30 capsule 1   traMADol (ULTRAM) 50 MG tablet TAKE 1 TABLET BY MOUTH EVERY 6 HOURS AS NEEDED. 65 tablet 3   traZODone (DESYREL) 50 MG tablet TAKE 1/2 TO 1 TABLET BY MOUTH AT BEDTIME AS NEEDED FOR SLEEP 90 tablet 2   No facility-administered medications prior to visit.    ROS Review of Systems  Reason unable to perform ROS: mute.    Objective:  BP (!) 132/90   Pulse (!) 112   Temp 98.2 F (36.8 C) (Oral)   Ht _0  (1.499 m)   Wt 158 lb 12.8 oz (72 kg)   SpO2 96%   BMI 32.07 kg/m   BP Readings from Last 3 Encounters:  07/26/22 (!) 132/90  04/26/22 128/72  10/01/21 116/68    Wt Readings from Last 3 Encounters:  07/26/22 158 lb 12.8 oz (72 kg)  04/26/22 158 lb (71.7 kg)  10/01/21 158 lb (71.7 kg)    Physical Exam Cardiovascular:     Rate and Rhythm: Regular rhythm. Tachycardia present.     Comments: EKG - ST, 103 bpm LAD  No LVH or Q waves     Lab Results  Component Value Date   WBC 7.9 04/26/2022   HGB 13.1 04/26/2022   HCT 40.1 04/26/2022   PLT 358.0 04/26/2022   GLUCOSE 176 (H) 07/26/2022   CHOL 191 07/26/2022   TRIG 95.0 07/26/2022   HDL 49.10 07/26/2022   LDLCALC 123 (H) 07/26/2022   ALT 17 07/26/2022   AST 18 07/26/2022   NA 138 07/26/2022   K 3.7 07/26/2022   CL 103 07/26/2022   CREATININE 0.93 07/26/2022   BUN 10 07/26/2022   CO2 30 07/26/2022   TSH 1.19 07/26/2022   INR 1.0 12/03/2020   HGBA1C 7.5 (H) 07/26/2022   MICROALBUR 2.5 (H) 07/26/2022    US THYROID  Result Date: 05/16/2022 CLINICAL DATA:  Goiter. History of multinodular thyroid gland. Patient previously underwent biopsy of right-sided thyroid nodules in December of 2016 EXAM: THYROID ULTRASOUND TECHNIQUE: Ultrasound examination of the thyroid gland and adjacent soft tissues was performed. COMPARISON:  Prior thyroid ultrasound  06/26/2015 FINDINGS: Parenchymal Echotexture: Moderately heterogenous Isthmus: 0.5 cm Right lobe: 6.4 x 4.0 x 4.1 cm Left lobe: 5.2 x 1.7 x 1.5 cm _________________________________________________________ Estimated total number of nodules >/= 1 cm: 2 Number of spongiform nodules >/=  2 cm not described below (TR1): 0 Number of mixed cystic and solid nodules >/= 1.5 cm not described below (TR2): 0 _________________________________________________________ Large heterogeneous thyroid gland. Nodule # 1: The previously biopsied nodule occupying the majority of the right gland appears similar at 5.8 x 4.4 x 3.1 cm compared to 5.4 x 3.9 x 2.6 cm 7 years previously. Additional small thyroid nodules scattered throughout the left gland demonstrate no significant interval change. Greater than 5 year stability is consistent with benignity. No new nodules or suspicious features. IMPRESSION: 1. No significant interval change in the previously biopsied nodule in the right gland. Greater than 5 year stability is consistent with benignity. 2. Confirmed greater than 5 year stability and thus benignity of nodules scattered throughout the left thyroid gland. 3. No new nodules or suspicious features. The above is in keeping with the ACR TI-RADS recommendations - J Am Coll Radiol 2017;14:587-595. Electronically Signed   By: Jacqulynn Cadet M.D.   On: 05/16/2022 07:03    Assessment & Plan:   Diagnoses and all orders for this visit:  Primary hypertension -     EKG 12-Lead -     Basic metabolic panel; Future -     TSH; Future -     Urinalysis, Routine w reflex microscopic; Future -     Urinalysis, Routine w reflex microscopic -     TSH -     Basic metabolic panel  Tachycardia -     EKG 12-Lead -     TSH; Future -     TSH  NASH (nonalcoholic steatohepatitis) -     Hepatic function panel; Future -     Hepatic function panel  Type II diabetes mellitus with manifestations ([place]) -     Basic metabolic panel; Future -      Hemoglobin A1c; Future -     Microalbumin / creatinine urine ratio; Future -     Microalbumin / creatinine urine ratio -     Hemoglobin A1c -     Basic metabolic panel  Stage 3a chronic kidney disease (HCC) -     Basic metabolic panel; Future -     Microalbumin / creatinine urine ratio; Future -     Urinalysis,  Routine w reflex microscopic; Future -     Urinalysis, Routine w reflex microscopic -     Microalbumin / creatinine urine ratio -     Basic metabolic panel  Hyperlipidemia with target LDL less than 130 -     Lipid panel; Future -     TSH; Future -     TSH -     Lipid panel   I am having Connye Burkitt. Radich maintain her Multiple Vitamins-Minerals (MULTIVITAMIN & MINERAL PO), ipratropium, gabapentin, blood glucose meter kit and supplies, famotidine, OneTouch Ultra, Qvar RediHaler, traZODone, donepezil, memantine, almotriptan, temazepam, dapagliflozin propanediol, levocetirizine, traMADol, DULoxetine, atorvastatin, potassium chloride SA, aspirin, and ALPRAZolam.  No orders of the defined types were placed in this encounter.    Follow-up: Return in about 3 months (around 10/25/2022).  Scarlette Calico, MD

## 2022-07-27 LAB — HEPATIC FUNCTION PANEL
ALT: 17 U/L (ref 0–35)
AST: 18 U/L (ref 0–37)
Albumin: 4.2 g/dL (ref 3.5–5.2)
Alkaline Phosphatase: 111 U/L (ref 39–117)
Bilirubin, Direct: 0.1 mg/dL (ref 0.0–0.3)
Total Bilirubin: 0.8 mg/dL (ref 0.2–1.2)
Total Protein: 7.9 g/dL (ref 6.0–8.3)

## 2022-07-27 LAB — BASIC METABOLIC PANEL
BUN: 10 mg/dL (ref 6–23)
CO2: 30 mEq/L (ref 19–32)
Calcium: 9.6 mg/dL (ref 8.4–10.5)
Chloride: 103 mEq/L (ref 96–112)
Creatinine, Ser: 0.93 mg/dL (ref 0.40–1.20)
GFR: 61.44 mL/min (ref >60.00)
Glucose, Bld: 176 mg/dL — ABNORMAL HIGH (ref 70–99)
Potassium: 3.7 mEq/L (ref 3.5–5.1)
Sodium: 138 mEq/L (ref 135–145)

## 2022-07-27 LAB — LIPID PANEL
Cholesterol: 191 mg/dL (ref 0–200)
HDL: 49.1 mg/dL (ref >39.00)
LDL Cholesterol: 123 mg/dL — ABNORMAL HIGH (ref 0–99)
NonHDL: 142.35
Total CHOL/HDL Ratio: 4
Triglycerides: 95 mg/dL (ref 0.0–149.0)
VLDL: 19 mg/dL (ref 0.0–40.0)

## 2022-07-27 LAB — TSH: TSH: 1.19 u[IU]/mL (ref 0.35–5.50)

## 2022-07-27 LAB — HEMOGLOBIN A1C: Hgb A1c MFr Bld: 7.5 % — ABNORMAL HIGH (ref 4.6–6.5)

## 2022-07-27 LAB — MICROALBUMIN / CREATININE URINE RATIO
Creatinine,U: 270.3 mg/dL
Microalb Creat Ratio: 0.9 mg/g (ref 0.0–30.0)
Microalb, Ur: 2.5 mg/dL — ABNORMAL HIGH (ref 0.0–1.9)

## 2022-07-31 MED ORDER — METOPROLOL SUCCINATE ER 25 MG PO TB24: 25.0000 mg | ORAL_TABLET | Freq: Every day | ORAL | 1 refills | Status: DC

## 2022-07-31 NOTE — Addendum Note (Signed)
Addended by: Etta Grandchild on: 07/31/2022 04:07 PM   Modules accepted: Orders

## 2022-08-03 ENCOUNTER — Other Ambulatory Visit (HOSPITAL_COMMUNITY): Payer: Self-pay

## 2022-08-03 ENCOUNTER — Other Ambulatory Visit: Payer: Self-pay | Admitting: Internal Medicine

## 2022-08-03 ENCOUNTER — Other Ambulatory Visit: Payer: Self-pay

## 2022-08-03 MED ORDER — ALPRAZOLAM 1 MG PO TABS
1.0000 mg | ORAL_TABLET | Freq: Two times a day (BID) | ORAL | 2 refills | Status: DC | PRN
  Filled 2022-08-03 – 2022-10-11 (×2): qty 60, 30d supply, fill #0

## 2022-08-04 ENCOUNTER — Other Ambulatory Visit (HOSPITAL_COMMUNITY): Payer: Self-pay

## 2022-08-04 ENCOUNTER — Encounter (HOSPITAL_COMMUNITY): Payer: Self-pay

## 2022-08-06 ENCOUNTER — Encounter: Payer: Self-pay | Admitting: Internal Medicine

## 2022-08-11 ENCOUNTER — Other Ambulatory Visit: Payer: Self-pay

## 2022-08-19 ENCOUNTER — Other Ambulatory Visit: Payer: Self-pay | Admitting: Internal Medicine

## 2022-08-19 DIAGNOSIS — E876 Hypokalemia: Secondary | ICD-10-CM

## 2022-08-19 DIAGNOSIS — E118 Type 2 diabetes mellitus with unspecified complications: Secondary | ICD-10-CM

## 2022-08-19 DIAGNOSIS — E785 Hyperlipidemia, unspecified: Secondary | ICD-10-CM

## 2022-09-06 ENCOUNTER — Telehealth: Payer: Self-pay | Admitting: Internal Medicine

## 2022-09-06 NOTE — Telephone Encounter (Signed)
Left message for patient to call back and schedule Medicare Annual Wellness Visit (AWV) in office.   Please offer to do virtually or by telephone.    Please schedule at any time with [facility].  30 minute appointment  Any questions, please contact me at [phone]   Thank you,   [place] [facility] [place] [facility] ??[phone] or ??[phone]

## 2022-10-07 ENCOUNTER — Other Ambulatory Visit: Payer: Self-pay | Admitting: Internal Medicine

## 2022-10-07 DIAGNOSIS — J301 Allergic rhinitis due to pollen: Secondary | ICD-10-CM

## 2022-10-12 ENCOUNTER — Other Ambulatory Visit: Payer: Self-pay

## 2022-10-14 ENCOUNTER — Encounter (HOSPITAL_COMMUNITY): Payer: Self-pay

## 2022-10-14 ENCOUNTER — Other Ambulatory Visit (HOSPITAL_COMMUNITY): Payer: Self-pay

## 2022-10-18 ENCOUNTER — Other Ambulatory Visit: Payer: Self-pay

## 2022-10-25 ENCOUNTER — Ambulatory Visit: Payer: Medicare Other | Admitting: Internal Medicine

## 2022-12-07 ENCOUNTER — Ambulatory Visit (INDEPENDENT_AMBULATORY_CARE_PROVIDER_SITE_OTHER): Payer: Medicare Other

## 2022-12-07 ENCOUNTER — Encounter: Payer: Self-pay | Admitting: Internal Medicine

## 2022-12-07 ENCOUNTER — Ambulatory Visit (INDEPENDENT_AMBULATORY_CARE_PROVIDER_SITE_OTHER): Payer: Medicare Other | Admitting: Internal Medicine

## 2022-12-07 VITALS — BP 126/86 | HR 107 | Temp 98.1°F | Ht 59.0 in | Wt 148.0 lb

## 2022-12-07 DIAGNOSIS — R0781 Pleurodynia: Secondary | ICD-10-CM | POA: Insufficient documentation

## 2022-12-07 DIAGNOSIS — E1140 Type 2 diabetes mellitus with diabetic neuropathy, unspecified: Secondary | ICD-10-CM

## 2022-12-07 DIAGNOSIS — I10 Essential (primary) hypertension: Secondary | ICD-10-CM | POA: Diagnosis not present

## 2022-12-07 DIAGNOSIS — R000 Tachycardia, unspecified: Secondary | ICD-10-CM | POA: Diagnosis not present

## 2022-12-07 DIAGNOSIS — F411 Generalized anxiety disorder: Secondary | ICD-10-CM

## 2022-12-07 DIAGNOSIS — E118 Type 2 diabetes mellitus with unspecified complications: Secondary | ICD-10-CM

## 2022-12-07 DIAGNOSIS — G43409 Hemiplegic migraine, not intractable, without status migrainosus: Secondary | ICD-10-CM

## 2022-12-07 DIAGNOSIS — N1831 Chronic kidney disease, stage 3a: Secondary | ICD-10-CM

## 2022-12-07 DIAGNOSIS — R7989 Other specified abnormal findings of blood chemistry: Secondary | ICD-10-CM

## 2022-12-07 LAB — TROPONIN I (HIGH SENSITIVITY): High Sens Troponin I: 5 ng/L (ref 2–17)

## 2022-12-07 LAB — D-DIMER, QUANTITATIVE: D-Dimer, Quant: 0.97 mcg/mL FEU — ABNORMAL HIGH (ref <0.50)

## 2022-12-07 NOTE — Patient Instructions (Signed)
Nonspecific Chest Pain, Adult Chest pain is an uncomfortable, tight, or painful feeling in the chest. The pain can feel like a crushing, aching, or squeezing pressure. A person can feel a burning or tingling sensation. Chest pain can also be felt in your back, neck, jaw, shoulder, or arm. This pain can be worse when you move, sneeze, or take a deep breath. Chest pain can be caused by a condition that is life-threatening. This must be treated right away. It can also be caused by something that is not life-threatening. If you have chest pain, it can be hard to know the difference, so it is important to get help right away to make sure that you do not have a serious condition. Some life-threatening causes of chest pain include: Heart attack. A tear in the body's main blood vessel (aortic dissection). Inflammation around your heart (pericarditis). A problem in the lungs, such as a blood clot (pulmonary embolism) or a collapsed lung (pneumothorax). Some non life-threatening causes of chest pain include: Heartburn. Anxiety or stress. Damage to the bones, muscles, and cartilage that make up your chest wall. Pneumonia or bronchitis. Shingles infection (varicella-zoster virus). Your chest pain may come and go. It may also be constant. Your health care provider will do tests and other studies to find the cause of your pain. Treatment will depend on the cause of your chest pain. Follow these instructions at home: Medicines Take over-the-counter and prescription medicines only as told by your health care provider. If you were prescribed an antibiotic medicine, take it as told by your health care provider. Do not stop taking the antibiotic even if you start to feel better. Activity Avoid any activities that cause chest pain. Do not lift anything that is heavier than 10 lb (4.5 kg), or the limit that you are told, until your health care provider says that it is safe. Rest as directed by your health care  provider. Return to your normal activities only as told by your health care provider. Ask your health care provider what activities are safe for you. Lifestyle     Do not use any products that contain nicotine or tobacco, such as cigarettes, e-cigarettes, and chewing tobacco. If you need help quitting, ask your health care provider. Do not drink alcohol. Make healthy lifestyle changes as recommended. These may include: Getting regular exercise. Ask your health care provider to suggest some exercises that are safe for you. Eating a heart-healthy diet. This includes plenty of fresh fruits and vegetables, whole grains, low-fat (lean) protein, and low-fat dairy products. A dietitian can help you find healthy eating options. Maintaining a healthy weight. Managing any other health conditions you may have, such as high blood pressure (hypertension) or diabetes. Reducing stress, such as with yoga or relaxation techniques. General instructions Pay attention to any changes in your symptoms. It is up to you to get the results of any tests that were done. Ask your health care provider, or the department that is doing the tests, when your results will be ready. Keep all follow-up visits as told by your health care provider. This is important. You may be asked to go for further testing if your chest pain does not go away. Contact a health care provider if: Your chest pain does not go away. You feel depressed. You have a fever. You notice changes in your symptoms or develop new symptoms. Get help right away if: Your chest pain gets worse. You have a cough that gets worse, or you   cough up blood. You have severe pain in your abdomen. You faint. You have sudden, unexplained chest discomfort. You have sudden, unexplained discomfort in your arms, back, neck, or jaw. You have shortness of breath at any time. You suddenly start to sweat, or your skin gets clammy. You feel nausea or you vomit. You  suddenly feel lightheaded or dizzy. You have severe weakness, or unexplained weakness or fatigue. Your heart begins to beat quickly, or it feels like it is skipping beats. These symptoms may represent a serious problem that is an emergency. Do not wait to see if the symptoms will go away. Get medical help right away. Call your local emergency services (911 in the [place] Do not drive yourself to the hospital. Summary Chest pain can be caused by a condition that is serious and requires urgent treatment. It may also be caused by something that is not life-threatening. Your health care provider may do lab tests and other studies to find the cause of your pain. Follow your health care provider's instructions on taking medicines, making lifestyle changes, and getting emergency treatment if symptoms become worse. Keep all follow-up visits as told by your health care provider. This includes visits for any further testing if your chest pain does not go away. This information is not intended to replace advice given to you by your health care provider. Make sure you discuss any questions you have with your health care provider. Document Revised: 06/10/2022 Document Reviewed: 06/10/2022 Elsevier Patient Education  2023 [employer] Inc[employer]  

## 2022-12-07 NOTE — Progress Notes (Signed)
Subjective:  Patient ID: Meghan Hickman, female    DOB: 1951/01/15  Age: 71 y.o. MRN: [id number]  CC: Chest Pain   HPI Meghan Hickman presents for f/up ----  She complains of a 5-week history of right lower chest pain.  She nods her head yes that it is worsened by deep breathing.  Outpatient Medications Prior to Visit  Medication Sig Dispense Refill   aspirin 81 MG chewable tablet Chew by mouth.     atorvastatin (LIPITOR) 20 MG tablet TAKE 1 TABLET BY MOUTH EVERY DAY 90 tablet 0   beclomethasone (QVAR REDIHALER) 80 MCG/ACT inhaler Inhale 1 puff into the lungs 2 (two) times daily. 1 each 11   blood glucose meter kit and supplies Dispense based on patient and insurance preference. Use up to three times daily as directed. (FOR ICD-10 E11.65). Dispense 100 lancets and test strips and 1 lancing device to match meter. 1 each 0   donepezil (ARICEPT) 10 MG tablet Take 1 tablet (10 mg total) by mouth at bedtime. 90 tablet 4   DULoxetine (CYMBALTA) 60 MG capsule Take 1 capsule (60 mg total) by mouth daily. 90 capsule 1   famotidine (PEPCID) 40 MG tablet Take 1 tablet (40 mg total) by mouth daily. 90 tablet 3   ipratropium (ATROVENT) 0.03 % nasal spray Place 2 sprays into both nostrils 2 (two) times daily. 30 mL 0   KLOR-CON M20 20 MEQ tablet TAKE 1 TABLET BY MOUTH TWICE A DAY 180 tablet 0   levocetirizine (XYZAL) 5 MG tablet TAKE 1 TABLET BY MOUTH EVERY DAY IN THE EVENING 90 tablet 1   memantine (NAMENDA) 10 MG tablet TAKE 1 TABLET BY MOUTH TWICE A DAY 180 tablet 4   metoprolol succinate (TOPROL-XL) 25 MG 24 hr tablet Take 1 tablet (25 mg total) by mouth daily. 90 tablet 1   Multiple Vitamins-Minerals (MULTIVITAMIN & MINERAL PO) Take 1 tablet by mouth daily.     ONETOUCH ULTRA test strip USE UP TO 3 TIMES A DAY AS DIRECTED 100 strip 2   traMADol (ULTRAM) 50 MG tablet TAKE 1 TABLET BY MOUTH EVERY 6 HOURS AS NEEDED. 65 tablet 3   traZODone (DESYREL) 50 MG tablet TAKE 1/2 TO 1 TABLET BY MOUTH AT  BEDTIME AS NEEDED FOR SLEEP 90 tablet 2   almotriptan (AXERT) 12.5 MG tablet Take 1 tablet (12.5 mg total) by mouth daily as needed for migraine. 10 tablet 4   ALPRAZolam (XANAX) 1 MG tablet Take 1 tablet (1 mg total) by mouth 2 (two) times daily as needed for anxiety. 60 tablet 2   dapagliflozin propanediol (FARXIGA) 10 MG TABS tablet Take 1 tablet (10 mg total) by mouth daily before breakfast. 90 tablet 1   gabapentin (NEURONTIN) 300 MG capsule TAKE 1 CAPSULE BY MOUTH THREE TIMES A DAY 270 capsule 0   No facility-administered medications prior to visit.    ROS Review of Systems  Constitutional:  Negative for chills, diaphoresis, fatigue and fever.  HENT: Negative.  Negative for trouble swallowing and voice change.   Eyes: Negative.   Respiratory: Negative.  Negative for cough, chest tightness, shortness of breath and wheezing.   Cardiovascular:  Positive for chest pain. Negative for palpitations and leg swelling.  Gastrointestinal:  Negative for abdominal pain, constipation, diarrhea, nausea and vomiting.  Endocrine: Negative.   Genitourinary: Negative.  Negative for difficulty urinating.  Musculoskeletal: Negative.   Skin: Negative.  Negative for rash.  Neurological:  Positive for speech difficulty. Negative  for dizziness.  Hematological:  Negative for adenopathy. Does not bruise/bleed easily.  Psychiatric/Behavioral: Negative.      Objective:  BP 126/86 (BP Location: Left Arm, Patient Position: Sitting, Cuff Size: Large)   Pulse (!) 107   Temp 98.1 F (36.7 C) (Oral)   Ht 4\' 11"  (1.499 m)   Wt 148 lb (67.1 kg)   SpO2 98%   BMI 29.89 kg/m   BP Readings from Last 3 Encounters:  12/07/22 126/86  07/26/22 (!) 132/90  04/26/22 128/72    Wt Readings from Last 3 Encounters:  12/07/22 148 lb (67.1 kg)  07/26/22 158 lb 12.8 oz (72 kg)  04/26/22 158 lb (71.7 kg)    Physical Exam Vitals reviewed.  Constitutional:      Appearance: She is not ill-appearing.  HENT:      Nose: Nose normal.     Mouth/Throat:     Mouth: Mucous membranes are moist.  Eyes:     General: No scleral icterus.    Conjunctiva/sclera: Conjunctivae normal.  Cardiovascular:     Rate and Rhythm: Regular rhythm. Tachycardia present.     Heart sounds: No murmur heard.    No friction rub. No gallop.  Pulmonary:     Effort: Pulmonary effort is normal. No respiratory distress.     Breath sounds: No stridor. No wheezing, rhonchi or rales.  Chest:     Chest wall: No tenderness.  Abdominal:     General: Abdomen is flat.     Palpations: There is no mass.     Tenderness: There is no abdominal tenderness. There is no guarding or rebound.     Hernia: No hernia is present.  Musculoskeletal:        General: Normal range of motion.     Cervical back: Neck supple.     Right lower leg: No edema.     Left lower leg: No edema.  Lymphadenopathy:     Cervical: No cervical adenopathy.  Skin:    General: Skin is warm and dry.     Findings: No lesion.  Neurological:     General: No focal deficit present.     Mental Status: She is alert. Mental status is at baseline.  Psychiatric:        Mood and Affect: Mood normal.        Behavior: Behavior normal.     Lab Results  Component Value Date   WBC 8.4 12/07/2022   HGB 13.0 12/07/2022   HCT 39.8 12/07/2022   PLT 403.0 (H) 12/07/2022   GLUCOSE 101 (H) 12/07/2022   CHOL 191 07/26/2022   TRIG 95.0 07/26/2022   HDL 49.10 07/26/2022   LDLCALC 123 (H) 07/26/2022   ALT 17 07/26/2022   AST 18 07/26/2022   NA 140 12/07/2022   K 3.8 12/07/2022   CL 105 12/07/2022   CREATININE 0.83 12/07/2022   BUN 11 12/07/2022   CO2 27 12/07/2022   TSH 1.19 07/26/2022   INR 1.0 12/03/2020   HGBA1C 6.6 (H) 12/07/2022   MICROALBUR 2.5 (H) 07/26/2022    US THYROID  Result Date: 05/16/2022 CLINICAL DATA:  Goiter. History of multinodular thyroid gland. Patient previously underwent biopsy of right-sided thyroid nodules in December of 2016 EXAM: THYROID  ULTRASOUND TECHNIQUE: Ultrasound examination of the thyroid gland and adjacent soft tissues was performed. COMPARISON:  Prior thyroid ultrasound 06/26/2015 FINDINGS: Parenchymal Echotexture: Moderately heterogenous Isthmus: 0.5 cm Right lobe: 6.4 x 4.0 x 4.1 cm Left lobe: 5.2 x 1.7 x 1.5  cm _________________________________________________________ Estimated total number of nodules >/= 1 cm: 2 Number of spongiform nodules >/=  2 cm not described below (TR1): 0 Number of mixed cystic and solid nodules >/= 1.5 cm not described below (TR2): 0 _________________________________________________________ Large heterogeneous thyroid gland. Nodule # 1: The previously biopsied nodule occupying the majority of the right gland appears similar at 5.8 x 4.4 x 3.1 cm compared to 5.4 x 3.9 x 2.6 cm 7 years previously. Additional small thyroid nodules scattered throughout the left gland demonstrate no significant interval change. Greater than 5 year stability is consistent with benignity. No new nodules or suspicious features. IMPRESSION: 1. No significant interval change in the previously biopsied nodule in the right gland. Greater than 5 year stability is consistent with benignity. 2. Confirmed greater than 5 year stability and thus benignity of nodules scattered throughout the left thyroid gland. 3. No new nodules or suspicious features. The above is in keeping with the ACR TI-RADS recommendations - J Am Coll Radiol 2017;14:587-595. Electronically Signed   By: Malachy Moan M.D.   On: 05/16/2022 07:03   DG Chest 2 View  Result Date: 12/07/2022 CLINICAL DATA:  Right pleuritic chest pain and shortness of breath EXAM: CHEST - 2 VIEW COMPARISON:  08/19/2016 FINDINGS: Stable cardiomediastinal silhouette given patient rotation. Aortic atherosclerotic calcification. No focal consolidation, pleural effusion, or pneumothorax. No displaced rib fractures. IMPRESSION: No active cardiopulmonary disease. Electronically Signed   By: Minerva Fester M.D.   On: 12/07/2022 15:58     Assessment & Plan:   Pleuritic chest pain- Plain films are negative.  She is tachycardic and has an elevated D-dimer and platelets.  I have ordered a CT angio to evaluate for malignancy and pulmonary embolus. -     Troponin I (High Sensitivity); Future -     D-dimer, quantitative; Future -     DG Chest 2 View; Future -     CT Angio Chest Pulmonary Embolism (PE) W or WO Contrast; Future  Primary hypertension -     CBC with Differential/Platelet; Future -     Basic metabolic panel; Future  Tachycardia -     CBC with Differential/Platelet; Future -     CT Angio Chest Pulmonary Embolism (PE) W or WO Contrast; Future  Type II diabetes mellitus with manifestations (HCC) -     Basic metabolic panel; Future -     Hemoglobin A1c; Future -     Dapagliflozin Propanediol; Take 1 tablet (10 mg total) by mouth daily before breakfast.  Dispense: 90 tablet; Refill: 1  D-dimer, elevated -     CT Angio Chest Pulmonary Embolism (PE) W or WO Contrast; Future  Hemiplegic migraine without status migrainosus, not intractable -     Almotriptan Malate; Take 1 tablet (12.5 mg total) by mouth daily as needed for migraine.  Dispense: 10 tablet; Refill: 4  GAD (generalized anxiety disorder) -     ALPRAZolam; Take 1 tablet (1 mg total) by mouth 2 (two) times daily as needed for anxiety.  Dispense: 60 tablet; Refill: 2  Neuropathy due to type 2 diabetes mellitus (HCC) -     Gabapentin; TAKE 1 CAPSULE BY MOUTH THREE TIMES A DAY  Dispense: 270 capsule; Refill: 0  Stage 3a chronic kidney disease (HCC) -     Dapagliflozin Propanediol; Take 1 tablet (10 mg total) by mouth daily before breakfast.  Dispense: 90 tablet; Refill: 1     Follow-up: Return in about 3 months (around 03/08/2023).  Sanda Linger,  MD

## 2022-12-08 DIAGNOSIS — R7989 Other specified abnormal findings of blood chemistry: Secondary | ICD-10-CM | POA: Insufficient documentation

## 2022-12-08 DIAGNOSIS — R000 Tachycardia, unspecified: Secondary | ICD-10-CM | POA: Insufficient documentation

## 2022-12-08 LAB — BASIC METABOLIC PANEL
BUN: 11 mg/dL (ref 6–23)
CO2: 27 mEq/L (ref 19–32)
Calcium: 9.7 mg/dL (ref 8.4–10.5)
Chloride: 105 mEq/L (ref 96–112)
Creatinine, Ser: 0.83 mg/dL (ref 0.40–1.20)
GFR: 70.25 mL/min (ref >60.00)
Glucose, Bld: 101 mg/dL — ABNORMAL HIGH (ref 70–99)
Potassium: 3.8 mEq/L (ref 3.5–5.1)
Sodium: 140 mEq/L (ref 135–145)

## 2022-12-08 LAB — CBC WITH DIFFERENTIAL/PLATELET
Basophils Absolute: 0.1 10*3/uL (ref 0.0–0.1)
Basophils Relative: 1.3 % (ref 0.0–3.0)
Eosinophils Absolute: 0.1 10*3/uL (ref 0.0–0.7)
Eosinophils Relative: 1 % (ref 0.0–5.0)
HCT: 39.8 % (ref 36.0–46.0)
Hemoglobin: 13 g/dL (ref 12.0–15.0)
Lymphocytes Relative: 35.4 % (ref 12.0–46.0)
Lymphs Abs: 3 10*3/uL (ref 0.7–4.0)
MCHC: 32.6 g/dL (ref 30.0–36.0)
MCV: 79.9 fl (ref 78.0–100.0)
Monocytes Absolute: 0.5 10*3/uL (ref 0.1–1.0)
Monocytes Relative: 6 % (ref 3.0–12.0)
Neutro Abs: 4.7 10*3/uL (ref 1.4–7.7)
Neutrophils Relative %: 56.3 % (ref 43.0–77.0)
Platelets: 403 10*3/uL — ABNORMAL HIGH (ref 150.0–400.0)
RBC: 4.98 Mil/uL (ref 3.87–5.11)
RDW: 16.1 % — ABNORMAL HIGH (ref 11.5–15.5)
WBC: 8.4 10*3/uL (ref 4.0–10.5)

## 2022-12-08 LAB — HEMOGLOBIN A1C: Hgb A1c MFr Bld: 6.6 % — ABNORMAL HIGH (ref 4.6–6.5)

## 2022-12-08 MED ORDER — GABAPENTIN 300 MG PO CAPS: ORAL_CAPSULE | ORAL | 0 refills | Status: DC

## 2022-12-08 MED ORDER — ALMOTRIPTAN MALATE 12.5 MG PO TABS: 12.5000 mg | ORAL_TABLET | Freq: Every day | ORAL | 4 refills | Status: DC | PRN

## 2022-12-08 MED ORDER — ALPRAZOLAM 1 MG PO TABS: 1.0000 mg | ORAL_TABLET | Freq: Two times a day (BID) | ORAL | 2 refills | Status: DC | PRN

## 2022-12-08 MED ORDER — DAPAGLIFLOZIN PROPANEDIOL 10 MG PO TABS: 10.0000 mg | ORAL_TABLET | Freq: Every day | ORAL | 1 refills | Status: DC

## 2022-12-09 ENCOUNTER — Other Ambulatory Visit: Payer: Self-pay | Admitting: Internal Medicine

## 2022-12-09 DIAGNOSIS — Z1231 Encounter for screening mammogram for malignant neoplasm of breast: Secondary | ICD-10-CM

## 2022-12-15 ENCOUNTER — Ambulatory Visit: Payer: Medicare Other

## 2022-12-30 ENCOUNTER — Ambulatory Visit
Admission: RE | Admit: 2022-12-30 | Discharge: 2022-12-30 | Disposition: A | Discharge status: Home / Self Care | Payer: Medicare Other | Source: Ambulatory Visit | Attending: Internal Medicine | Admitting: Internal Medicine

## 2022-12-30 DIAGNOSIS — Z1231 Encounter for screening mammogram for malignant neoplasm of breast: Secondary | ICD-10-CM

## 2022-12-31 ENCOUNTER — Telehealth: Payer: Self-pay | Admitting: Radiology

## 2022-12-31 NOTE — Telephone Encounter (Signed)
Left voice mail for patient to call back at [phone] to schedule Medicare Annual Wellness Visit    Last AWV:  no hx of AWV    Please schedule Sequential/Initial AWV with [facility]   Lyndal Alamillo K. CMA   

## 2023-01-24 ENCOUNTER — Other Ambulatory Visit: Payer: Self-pay | Admitting: Neurology

## 2023-01-29 ENCOUNTER — Other Ambulatory Visit: Payer: Self-pay | Admitting: Internal Medicine

## 2023-01-29 DIAGNOSIS — F331 Major depressive disorder, recurrent, moderate: Secondary | ICD-10-CM

## 2023-01-30 ENCOUNTER — Other Ambulatory Visit: Payer: Self-pay | Admitting: Neurology

## 2023-03-04 ENCOUNTER — Other Ambulatory Visit: Payer: Self-pay | Admitting: Neurology

## 2023-03-08 ENCOUNTER — Ambulatory Visit (INDEPENDENT_AMBULATORY_CARE_PROVIDER_SITE_OTHER): Payer: Medicare Other | Admitting: Internal Medicine

## 2023-03-08 ENCOUNTER — Encounter: Payer: Self-pay | Admitting: Internal Medicine

## 2023-03-08 VITALS — BP 124/80 | HR 105 | Temp 98.2°F | Ht 59.0 in | Wt 145.0 lb

## 2023-03-08 DIAGNOSIS — K7581 Nonalcoholic steatohepatitis (NASH): Secondary | ICD-10-CM | POA: Diagnosis not present

## 2023-03-08 DIAGNOSIS — G301 Alzheimer's disease with late onset: Secondary | ICD-10-CM

## 2023-03-08 DIAGNOSIS — R000 Tachycardia, unspecified: Secondary | ICD-10-CM | POA: Diagnosis not present

## 2023-03-08 DIAGNOSIS — E118 Type 2 diabetes mellitus with unspecified complications: Secondary | ICD-10-CM | POA: Diagnosis not present

## 2023-03-08 DIAGNOSIS — F02A0 Dementia in other diseases classified elsewhere, mild, without behavioral disturbance, psychotic disturbance, mood disturbance, and anxiety: Secondary | ICD-10-CM

## 2023-03-08 DIAGNOSIS — D696 Thrombocytopenia, unspecified: Secondary | ICD-10-CM | POA: Diagnosis not present

## 2023-03-08 DIAGNOSIS — R7989 Other specified abnormal findings of blood chemistry: Secondary | ICD-10-CM

## 2023-03-08 DIAGNOSIS — R10811 Right upper quadrant abdominal tenderness: Secondary | ICD-10-CM

## 2023-03-08 DIAGNOSIS — R9431 Abnormal electrocardiogram [ECG] [EKG]: Secondary | ICD-10-CM

## 2023-03-08 MED ORDER — BOOSTRIX 5-2.5-18.5 LF-MCG/0.5 IM SUSP: 0.5000 mL | Freq: Once | INTRAMUSCULAR | 0 refills | Status: AC

## 2023-03-08 MED ORDER — SHINGRIX 50 MCG/0.5ML IM SUSR: 0.5000 mL | Freq: Once | INTRAMUSCULAR | 1 refills | Status: AC

## 2023-03-08 MED ORDER — MEMANTINE HCL 10 MG PO TABS: 10.0000 mg | ORAL_TABLET | Freq: Two times a day (BID) | ORAL | 0 refills | Status: DC

## 2023-03-08 MED ORDER — DONEPEZIL HCL 10 MG PO TABS: 10.0000 mg | ORAL_TABLET | Freq: Every day | ORAL | 0 refills | Status: DC

## 2023-03-08 NOTE — Progress Notes (Unsigned)
Subjective:  Patient ID: Meghan Hickman, female    DOB: 1951-08-04  Age: 72 y.o. MRN: [id number]  CC: Diabetes   HPI ARYIEL COOR presents for f/up ----  Discussed the use of AI scribe software for clinical note transcription with the patient, who gave verbal consent to proceed.  History of Present Illness   The patient presents with an elevated heart rate, which they report as not causing any discomfort. They deny experiencing chest pain, shortness of breath, or changes in weight or appetite.       Outpatient Medications Prior to Visit  Medication Sig Dispense Refill   almotriptan (AXERT) 12.5 MG tablet Take 1 tablet (12.5 mg total) by mouth daily as needed for migraine. 10 tablet 4   ALPRAZolam (XANAX) 1 MG tablet Take 1 tablet (1 mg total) by mouth 2 (two) times daily as needed for anxiety. 60 tablet 2   aspirin 81 MG chewable tablet Chew by mouth.     atorvastatin (LIPITOR) 20 MG tablet TAKE 1 TABLET BY MOUTH EVERY DAY 90 tablet 0   beclomethasone (QVAR REDIHALER) 80 MCG/ACT inhaler Inhale 1 puff into the lungs 2 (two) times daily. 1 each 11   blood glucose meter kit and supplies Dispense based on patient and insurance preference. Use up to three times daily as directed. (FOR ICD-10 E11.65). Dispense 100 lancets and test strips and 1 lancing device to match meter. 1 each 0   dapagliflozin propanediol (FARXIGA) 10 MG TABS tablet Take 1 tablet (10 mg total) by mouth daily before breakfast. 90 tablet 1   DULoxetine (CYMBALTA) 60 MG capsule TAKE 1 CAPSULE BY MOUTH EVERY DAY 90 capsule 1   famotidine (PEPCID) 40 MG tablet Take 1 tablet (40 mg total) by mouth daily. 90 tablet 3   gabapentin (NEURONTIN) 300 MG capsule TAKE 1 CAPSULE BY MOUTH THREE TIMES A DAY 270 capsule 0   ipratropium (ATROVENT) 0.03 % nasal spray Place 2 sprays into both nostrils 2 (two) times daily. 30 mL 0   KLOR-CON M20 20 MEQ tablet TAKE 1 TABLET BY MOUTH TWICE A DAY 180 tablet 0   levocetirizine (XYZAL) 5 MG  tablet TAKE 1 TABLET BY MOUTH EVERY DAY IN THE EVENING 90 tablet 1   metoprolol succinate (TOPROL-XL) 25 MG 24 hr tablet Take 1 tablet (25 mg total) by mouth daily. 90 tablet 1   Multiple Vitamins-Minerals (MULTIVITAMIN & MINERAL PO) Take 1 tablet by mouth daily.     ONETOUCH ULTRA test strip USE UP TO 3 TIMES A DAY AS DIRECTED 100 strip 2   traMADol (ULTRAM) 50 MG tablet TAKE 1 TABLET BY MOUTH EVERY 6 HOURS AS NEEDED. 65 tablet 3   traZODone (DESYREL) 50 MG tablet TAKE 1/2 TO 1 TABLET BY MOUTH AT BEDTIME AS NEEDED FOR SLEEP 90 tablet 2   donepezil (ARICEPT) 10 MG tablet Take 1 tablet (10 mg total) by mouth at bedtime. 90 tablet 4   memantine (NAMENDA) 10 MG tablet TAKE 1 TABLET BY MOUTH TWICE A DAY 180 tablet 4   No facility-administered medications prior to visit.    ROS Review of Systems  Objective:  BP 124/80 (BP Location: Left Arm, Patient Position: Sitting, Cuff Size: Large)   Pulse (!) 105   Temp 98.2 F (36.8 C) (Oral)   Ht 4\' 11"  (1.499 m)   Wt 145 lb (65.8 kg)   SpO2 95%   BMI 29.29 kg/m   BP Readings from Last 3 Encounters:  03/08/23 124/80  12/07/22 126/86  07/26/22 (!) 132/90    Wt Readings from Last 3 Encounters:  03/08/23 145 lb (65.8 kg)  12/07/22 148 lb (67.1 kg)  07/26/22 158 lb 12.8 oz (72 kg)    Physical Exam Vitals reviewed.  Neck:     Thyroid: Thyroid mass and thyromegaly present. No thyroid tenderness.  Cardiovascular:     Rate and Rhythm: Regular rhythm. Tachycardia present.     Heart sounds: Normal heart sounds, S1 normal and S2 normal. No murmur heard.    Comments: EKG- NSR, 99 bpm Junctional ST depression is not new No LVH or Q waves  Musculoskeletal:     Right lower leg: No edema.     Left lower leg: No edema.     Lab Results  Component Value Date   WBC 8.4 12/07/2022   HGB 13.0 12/07/2022   HCT 39.8 12/07/2022   PLT 403.0 (H) 12/07/2022   GLUCOSE 101 (H) 12/07/2022   CHOL 191 07/26/2022   TRIG 95.0 07/26/2022   HDL 49.10  07/26/2022   LDLCALC 123 (H) 07/26/2022   ALT 17 07/26/2022   AST 18 07/26/2022   NA 140 12/07/2022   K 3.8 12/07/2022   CL 105 12/07/2022   CREATININE 0.83 12/07/2022   BUN 11 12/07/2022   CO2 27 12/07/2022   TSH 1.19 07/26/2022   INR 1.0 12/03/2020   HGBA1C 6.6 (H) 12/07/2022   MICROALBUR 2.5 (H) 07/26/2022    MM 3D SCREENING MAMMOGRAM BILATERAL BREAST  Result Date: 01/04/2023 CLINICAL DATA:  Screening. EXAM: DIGITAL SCREENING BILATERAL MAMMOGRAM WITH TOMOSYNTHESIS AND CAD TECHNIQUE: Bilateral screening digital craniocaudal and mediolateral oblique mammograms were obtained. Bilateral screening digital breast tomosynthesis was performed. The images were evaluated with computer-aided detection. COMPARISON:  Previous exam(s). ACR Breast Density Category b: There are scattered areas of fibroglandular density. FINDINGS: There are no findings suspicious for malignancy. IMPRESSION: No mammographic evidence of malignancy. A result letter of this screening mammogram will be mailed directly to the patient. RECOMMENDATION: Screening mammogram in one year. (Code:[id number]-B-01Y) BI-RADS CATEGORY  1: Negative. Electronically Signed   By: Edwin Cap M.D.   On: 01/04/2023 08:18    Assessment & Plan:  Mild late onset Alzheimer's dementia without behavioral disturbance, psychotic disturbance, mood disturbance, or anxiety (HCC) -     Donepezil HCl; Take 1 tablet (10 mg total) by mouth at bedtime.  Dispense: 90 tablet; Refill: 0 -     Memantine HCl; Take 1 tablet (10 mg total) by mouth 2 (two) times daily.  Dispense: 180 tablet; Refill: 0  Tachycardia -     EKG 12-Lead -     CBC with Differential/Platelet; Future -     Folate; Future -     Troponin I (High Sensitivity); Future  Thrombocytopenia (HCC) -     Vitamin B12; Future -     CBC with Differential/Platelet; Future  Type II diabetes mellitus with manifestations (HCC) -     Hemoglobin A1c; Future  NASH (nonalcoholic steatohepatitis) -      Hepatic function panel; Future  Abnormal electrocardiogram (ECG) (EKG) -     Troponin I (High Sensitivity); Future  Other orders -     Shingrix; Inject 0.5 mLs into the muscle once for 1 dose.  Dispense: 0.5 mL; Refill: 1 -     Boostrix; Inject 0.5 mLs into the muscle once for 1 dose.  Dispense: 0.5 mL; Refill: 0     Follow-up: Return in about 4 months (around 07/09/2023).  Sanda Linger,  MD

## 2023-03-08 NOTE — Patient Instructions (Signed)

## 2023-03-09 DIAGNOSIS — R7989 Other specified abnormal findings of blood chemistry: Secondary | ICD-10-CM | POA: Insufficient documentation

## 2023-03-09 DIAGNOSIS — R10811 Right upper quadrant abdominal tenderness: Secondary | ICD-10-CM | POA: Insufficient documentation

## 2023-03-17 ENCOUNTER — Encounter: Payer: Medicare Other | Admitting: Internal Medicine

## 2023-03-24 ENCOUNTER — Encounter (INDEPENDENT_AMBULATORY_CARE_PROVIDER_SITE_OTHER): Payer: Self-pay

## 2023-03-30 ENCOUNTER — Telehealth: Payer: Self-pay | Admitting: Internal Medicine

## 2023-03-30 NOTE — Telephone Encounter (Signed)
Ladona Ridgel with [facility] needs the patient's last office visit notes.  Please fax to: [phone]

## 2023-04-08 ENCOUNTER — Other Ambulatory Visit: Payer: Self-pay | Admitting: Internal Medicine

## 2023-04-08 DIAGNOSIS — J301 Allergic rhinitis due to pollen: Secondary | ICD-10-CM

## 2023-04-20 ENCOUNTER — Other Ambulatory Visit: Payer: Self-pay | Admitting: Internal Medicine

## 2023-04-20 ENCOUNTER — Encounter: Payer: Self-pay | Admitting: Internal Medicine

## 2023-04-20 ENCOUNTER — Ambulatory Visit (INDEPENDENT_AMBULATORY_CARE_PROVIDER_SITE_OTHER): Payer: Medicare Other | Admitting: Internal Medicine

## 2023-04-20 VITALS — BP 136/84 | HR 100 | Temp 98.4°F | Resp 16 | Ht 59.0 in | Wt 147.0 lb

## 2023-04-20 DIAGNOSIS — E041 Nontoxic single thyroid nodule: Secondary | ICD-10-CM | POA: Diagnosis not present

## 2023-04-20 DIAGNOSIS — Z23 Encounter for immunization: Secondary | ICD-10-CM | POA: Diagnosis not present

## 2023-04-20 DIAGNOSIS — I10 Essential (primary) hypertension: Secondary | ICD-10-CM | POA: Diagnosis not present

## 2023-04-20 DIAGNOSIS — R000 Tachycardia, unspecified: Secondary | ICD-10-CM | POA: Diagnosis not present

## 2023-04-20 LAB — TSH: TSH: 1.09 u[IU]/mL (ref 0.35–5.50)

## 2023-04-20 LAB — BASIC METABOLIC PANEL
BUN: 9 mg/dL (ref 6–23)
CO2: 31 meq/L (ref 19–32)
Calcium: 9.9 mg/dL (ref 8.4–10.5)
Chloride: 101 meq/L (ref 96–112)
Creatinine, Ser: 0.9 mg/dL (ref 0.40–1.20)
GFR: 63.58 mL/min (ref >60.00)
Glucose, Bld: 192 mg/dL — ABNORMAL HIGH (ref 70–99)
Potassium: 3.4 meq/L — ABNORMAL LOW (ref 3.5–5.1)
Sodium: 140 meq/L (ref 135–145)

## 2023-04-20 NOTE — Progress Notes (Signed)
Subjective:  Patient ID: Meghan Hickman, female    DOB: 10-30-1950  Age: 72 y.o. MRN: [id number]  CC: Diabetes and Hypertension   HPI Meghan MERLINI presents for f/up ----  Discussed the use of AI scribe software for clinical note transcription with the patient, who gave verbal consent to proceed.  History of Present Illness   The patient, accompanied by a friend due to communication difficulties related to aphasia, presents for an annual visit. The patient is independent in her activities of daily living, including driving and grocery shopping. The friend member has not observed any new symptoms or changes in the patient's condition.  The patient is under the care of a neurologist for her aphasia. She has not reported any discomfort or symptoms related to a known thyroid nodule. An MRI of the abdomen has been scheduled for the following week, the reason for which is not specified in the conversation.  The patient mentioned a blood sugar machine, but it was clarified that she does not need to monitor her blood sugar levels.       Outpatient Medications Prior to Visit  Medication Sig Dispense Refill   almotriptan (AXERT) 12.5 MG tablet Take 1 tablet (12.5 mg total) by mouth daily as needed for migraine. 10 tablet 4   ALPRAZolam (XANAX) 1 MG tablet Take 1 tablet (1 mg total) by mouth 2 (two) times daily as needed for anxiety. 60 tablet 2   aspirin 81 MG chewable tablet Chew by mouth.     atorvastatin (LIPITOR) 20 MG tablet TAKE 1 TABLET BY MOUTH EVERY DAY 90 tablet 0   beclomethasone (QVAR REDIHALER) 80 MCG/ACT inhaler Inhale 1 puff into the lungs 2 (two) times daily. 1 each 11   blood glucose meter kit and supplies Dispense based on patient and insurance preference. Use up to three times daily as directed. (FOR ICD-10 E11.65). Dispense 100 lancets and test strips and 1 lancing device to match meter. 1 each 0   dapagliflozin propanediol (FARXIGA) 10 MG TABS tablet Take 1 tablet (10 mg  total) by mouth daily before breakfast. 90 tablet 1   DULoxetine (CYMBALTA) 60 MG capsule TAKE 1 CAPSULE BY MOUTH EVERY DAY 90 capsule 1   famotidine (PEPCID) 40 MG tablet Take 1 tablet (40 mg total) by mouth daily. 90 tablet 3   gabapentin (NEURONTIN) 300 MG capsule TAKE 1 CAPSULE BY MOUTH THREE TIMES A DAY 270 capsule 0   ipratropium (ATROVENT) 0.03 % nasal spray Place 2 sprays into both nostrils 2 (two) times daily. 30 mL 0   KLOR-CON M20 20 MEQ tablet TAKE 1 TABLET BY MOUTH TWICE A DAY 180 tablet 0   levocetirizine (XYZAL) 5 MG tablet TAKE 1 TABLET BY MOUTH EVERY DAY IN THE EVENING 90 tablet 1   memantine (NAMENDA) 10 MG tablet Take 1 tablet (10 mg total) by mouth 2 (two) times daily. 180 tablet 0   metoprolol succinate (TOPROL-XL) 25 MG 24 hr tablet Take 1 tablet (25 mg total) by mouth daily. 90 tablet 1   Multiple Vitamins-Minerals (MULTIVITAMIN & MINERAL PO) Take 1 tablet by mouth daily.     ONETOUCH ULTRA test strip USE UP TO 3 TIMES A DAY AS DIRECTED 100 strip 2   traMADol (ULTRAM) 50 MG tablet TAKE 1 TABLET BY MOUTH EVERY 6 HOURS AS NEEDED. 65 tablet 3   traZODone (DESYREL) 50 MG tablet TAKE 1/2 TO 1 TABLET BY MOUTH AT BEDTIME AS NEEDED FOR SLEEP 90 tablet 2  donepezil (ARICEPT) 10 MG tablet Take 1 tablet (10 mg total) by mouth at bedtime. 90 tablet 0   No facility-administered medications prior to visit.    ROS Review of Systems  Constitutional:  Negative for chills, diaphoresis and fatigue.  HENT: Negative.    Respiratory:  Negative for cough, chest tightness, shortness of breath and wheezing.   Cardiovascular:  Negative for chest pain, palpitations and leg swelling.  Gastrointestinal: Negative.  Negative for abdominal pain, constipation, diarrhea and nausea.  Genitourinary: Negative.  Negative for difficulty urinating.  Musculoskeletal: Negative.   Skin: Negative.   Neurological:  Positive for speech difficulty. Negative for dizziness, weakness and numbness.  Hematological:   Negative for adenopathy. Does not bruise/bleed easily.  Psychiatric/Behavioral:  Positive for confusion.     Objective:  BP 136/84 (BP Location: Left Arm, Patient Position: Sitting, Cuff Size: Large)   Pulse 100   Temp 98.4 F (36.9 C) (Oral)   Resp 16   Ht 4\' 11"  (1.499 m)   Wt 147 lb (66.7 kg)   SpO2 93%   BMI 29.69 kg/m   BP Readings from Last 3 Encounters:  04/20/23 136/84  03/08/23 124/80  12/07/22 126/86    Wt Readings from Last 3 Encounters:  04/20/23 147 lb (66.7 kg)  03/08/23 145 lb (65.8 kg)  12/07/22 148 lb (67.1 kg)    Physical Exam Vitals reviewed.  Constitutional:      Appearance: Normal appearance.  HENT:     Mouth/Throat:     Mouth: Mucous membranes are moist.  Eyes:     General: No scleral icterus.    Conjunctiva/sclera: Conjunctivae normal.  Cardiovascular:     Rate and Rhythm: Regular rhythm. Tachycardia present.     Heart sounds: No murmur heard. Pulmonary:     Effort: Pulmonary effort is normal.     Breath sounds: No stridor. No wheezing, rhonchi or rales.  Abdominal:     General: Abdomen is flat.     Palpations: There is no mass.     Tenderness: There is no abdominal tenderness. There is no guarding.     Hernia: No hernia is present.  Musculoskeletal:        General: Normal range of motion.     Cervical back: Neck supple.     Right lower leg: No edema.     Left lower leg: No edema.  Lymphadenopathy:     Cervical: No cervical adenopathy.  Skin:    General: Skin is warm and dry.  Neurological:     General: No focal deficit present.     Mental Status: She is alert. Mental status is at baseline.  Psychiatric:        Mood and Affect: Mood normal.        Behavior: Behavior normal.     Lab Results  Component Value Date   WBC 7.3 03/08/2023   HGB 13.3 03/08/2023   HCT 42.7 03/08/2023   PLT 358.0 03/08/2023   GLUCOSE 192 (H) 04/20/2023   CHOL 191 07/26/2022   TRIG 95.0 07/26/2022   HDL 49.10 07/26/2022   LDLCALC 123 (H)  07/26/2022   ALT 36 (H) 03/08/2023   AST 41 (H) 03/08/2023   NA 140 04/20/2023   K 3.4 (L) 04/20/2023   CL 101 04/20/2023   CREATININE 0.90 04/20/2023   BUN 9 04/20/2023   CO2 31 04/20/2023   TSH 1.09 04/20/2023   INR 1.0 12/03/2020   HGBA1C 6.4 03/08/2023   MICROALBUR 2.5 (H) 07/26/2022  MM 3D SCREENING MAMMOGRAM BILATERAL BREAST  Result Date: 01/04/2023 CLINICAL DATA:  Screening. EXAM: DIGITAL SCREENING BILATERAL MAMMOGRAM WITH TOMOSYNTHESIS AND CAD TECHNIQUE: Bilateral screening digital craniocaudal and mediolateral oblique mammograms were obtained. Bilateral screening digital breast tomosynthesis was performed. The images were evaluated with computer-aided detection. COMPARISON:  Previous exam(s). ACR Breast Density Category b: There are scattered areas of fibroglandular density. FINDINGS: There are no findings suspicious for malignancy. IMPRESSION: No mammographic evidence of malignancy. A result letter of this screening mammogram will be mailed directly to the patient. RECOMMENDATION: Screening mammogram in one year. (Code:[id number]-B-01Y) BI-RADS CATEGORY  1: Negative. Electronically Signed   By: Edwin Cap M.D.   On: 01/04/2023 08:18    Assessment & Plan:   Primary hypertension - Her BP is well controlled. -     TSH; Future -     Basic metabolic panel; Future  Nodule of right lobe of thyroid gland - She is euthyroid. -     TSH; Future -     Ambulatory referral to General Surgery  Tachycardia -     TSH; Future  Flu vaccine need -     Flu Vaccine Trivalent High Dose (Fluad)     Follow-up: Return in about 4 months (around 08/20/2023).  Sanda Linger, MD

## 2023-04-20 NOTE — Patient Instructions (Signed)
 Thyroid Nodule  A thyroid nodule is an isolated growth of thyroid cells that forms a lump in the thyroid gland. The thyroid gland is a butterfly-shaped gland found in the lower front of the neck. It sends chemical messengers (hormones) through the blood to all parts of the body. These hormones are important in regulating body temperature and helping the body use energy. Thyroid nodules are common. Most are not cancerous (are benign). You may have one nodule or several nodules. There are different types of thyroid nodules. They include nodules that: Grow and fill with fluid (thyroid cysts). Produce too much thyroid hormone (hot nodules or hyperthyroid). Produce no thyroid hormone (cold nodules or hypothyroid). Form from cancer cells (thyroid cancers). What are the causes? In most cases, the cause of thyroid nodules is not known. What increases the risk? The following factors may make you more likely to develop thyroid nodules: Age. Thyroid nodules are more common in people who are older than 45 years. Female gender. A family history that includes: Thyroid nodules. Pheochromocytoma. Thyroid carcinoma. Hyperparathyroidism. Certain thyroid diseases, such as Hashimoto's thyroiditis. Lack of iodine in your diet. A history of head and neck radiation, such as from cancer treatments. Type 2 diabetes. What are the signs or symptoms? In many cases, there are no symptoms. If you have symptoms, they may include: A lump in your lower neck. Feeling pressure, fullness, or a tickle in your throat. Pain in your neck, jaw, or ear. Having trouble swallowing or breathing. Hot nodules may cause: Weight loss. Warm, flushed skin. Feeling hot. Feeling nervous. A rapid or irregular heartbeat. Cold nodules may cause: Weight gain. Dry skin. Hair loss, brittle hair, or both. Feeling cold. Fatigue. Thyroid cancer nodules may cause: Hard nodules that can be felt along the thyroid  gland. Hoarseness. Lumps in the tissue (lymph nodes) near your thyroid gland. How is this diagnosed? A thyroid nodule may be felt by your health care provider during a physical exam. This condition may also be diagnosed based on your symptoms. You may also have tests, including: Blood tests to check how well your thyroid is working. An ultrasound. This may be done to confirm the diagnosis. A biopsy. This involves taking a sample from the nodule and looking at it under a microscope. A thyroid scan. This test creates an image of the thyroid gland using a radioactive tracer. Imaging tests such as an MRI or CT scan. These may be done if: A nodule is large. A nodule is blocking your airway. Cancer is suspected. How is this treated? Treatment depends on the cause and size of your nodule or nodules. If a nodule is benign, treatment may not be necessary. Your health care provider may monitor the nodule to see if it goes away without treatment. If a nodule continues to grow, is cancerous, or does not go away, treatment may be needed. Treatment may include: Having a cystic nodule drained with a needle. Ablation therapy. In this treatment, alcohol is injected into the area of the nodule to destroy the cells. Ablation with heat may also be used. This is called thermal ablation. Radioactive iodine. In this treatment, radioactive iodine is given as a pill or liquid that you drink. This substance causes the thyroid nodule to shrink. Surgery to remove the nodule or nodules. Part or all of your thyroid gland may also need to be removed. Medicines to treat hyperthyroidism. Follow these instructions at home: Pay attention to any changes in your thyroid nodule or nodules. Take over-the-counter  and prescription medicines only as told by your health care provider. Keep all follow-up visits. This is important. Contact a health care provider if: You have trouble sleeping. You have muscle weakness. You have  significant weight loss without changing your eating habits. You feel nervous. You have trouble swallowing. You have increased swelling. You have a rapid or irregular heartbeat. Get help right away if: You have chest pain. You faint or lose consciousness. Your nodule makes it hard for you to breathe. These symptoms may be an emergency. Get help right away. Call 911. Do not wait to see if the symptoms will go away. Do not drive yourself to the hospital. Summary A thyroid nodule is an isolated growth of thyroid cells that forms a lump in your thyroid gland. Thyroid nodules are common. Most are not cancerous. Your health care provider may monitor the nodule to see if it goes away without treatment. If a nodule continues to grow, is cancerous, or does not go away, treatment may be needed. Treatment depends on the cause and size of your nodule or nodules. This information is not intended to replace advice given to you by your health care provider. Make sure you discuss any questions you have with your health care provider. Document Revised: 06/08/2021 Document Reviewed: 06/08/2021 Elsevier Patient Education  2024 [employer].

## 2023-04-27 ENCOUNTER — Ambulatory Visit
Admission: RE | Admit: 2023-04-27 | Discharge: 2023-04-27 | Disposition: A | Discharge status: Home / Self Care | Payer: Medicare Other | Source: Ambulatory Visit | Attending: Internal Medicine | Admitting: Internal Medicine

## 2023-04-27 DIAGNOSIS — R7989 Other specified abnormal findings of blood chemistry: Secondary | ICD-10-CM

## 2023-04-27 DIAGNOSIS — K7581 Nonalcoholic steatohepatitis (NASH): Secondary | ICD-10-CM

## 2023-04-27 DIAGNOSIS — R10811 Right upper quadrant abdominal tenderness: Secondary | ICD-10-CM

## 2023-04-27 MED ORDER — GADOPICLENOL 0.5 MMOL/ML IV SOLN
10.0000 mL | Freq: Once | INTRAVENOUS | Status: AC | PRN
  Administered 2023-04-27: 10 mL via INTRAVENOUS

## 2023-05-01 ENCOUNTER — Other Ambulatory Visit: Payer: Self-pay | Admitting: Internal Medicine

## 2023-05-01 DIAGNOSIS — F331 Major depressive disorder, recurrent, moderate: Secondary | ICD-10-CM

## 2023-05-06 ENCOUNTER — Other Ambulatory Visit (HOSPITAL_BASED_OUTPATIENT_CLINIC_OR_DEPARTMENT_OTHER): Payer: Self-pay

## 2023-06-01 ENCOUNTER — Telehealth: Payer: Self-pay | Admitting: Internal Medicine

## 2023-06-01 ENCOUNTER — Other Ambulatory Visit: Payer: Self-pay | Admitting: Internal Medicine

## 2023-06-01 DIAGNOSIS — F0280 Dementia in other diseases classified elsewhere without behavioral disturbance: Secondary | ICD-10-CM

## 2023-06-01 NOTE — Telephone Encounter (Signed)
Please advise. This referral would be for speech therapy.

## 2023-06-01 NOTE — Telephone Encounter (Signed)
Patient's brother called about getting a referral to [facility] for the patient. Earnest Rosier would like a call back at [phone].

## 2023-06-03 ENCOUNTER — Other Ambulatory Visit: Payer: Self-pay | Admitting: Internal Medicine

## 2023-06-03 DIAGNOSIS — F02A0 Dementia in other diseases classified elsewhere, mild, without behavioral disturbance, psychotic disturbance, mood disturbance, and anxiety: Secondary | ICD-10-CM

## 2023-06-03 MED ORDER — MEMANTINE HCL 10 MG PO TABS
10.0000 mg | ORAL_TABLET | Freq: Two times a day (BID) | ORAL | 0 refills | Status: DC
Start: 2023-06-03 — End: 2023-09-12

## 2023-06-24 ENCOUNTER — Telehealth: Payer: Self-pay | Admitting: Internal Medicine

## 2023-06-24 NOTE — Telephone Encounter (Signed)
Prescription Request  06/24/2023  LOV: 04/20/2023  Next appt: 11.21.24  What is the name of the medication or equipment? metoprolol succinate (TOPROL-XL) 25 MG 24 hr tablet  `  Have you contacted your pharmacy to request a refill? No   Which pharmacy would you like this sent to?  [id number] - [place],  - [address] AT CORNER OF [facility] [address] [place] [address] Phone: [phone] Fax: [phone]    Patient notified that their request is being sent to the clinical staff for review and that they should receive a response within 2 business days.   Please advise at Mobile [phone] (mobile)

## 2023-06-27 ENCOUNTER — Other Ambulatory Visit: Payer: Self-pay

## 2023-06-27 DIAGNOSIS — I10 Essential (primary) hypertension: Secondary | ICD-10-CM

## 2023-06-27 DIAGNOSIS — R000 Tachycardia, unspecified: Secondary | ICD-10-CM

## 2023-06-27 MED ORDER — METOPROLOL SUCCINATE ER 25 MG PO TB24: 25.0000 mg | ORAL_TABLET | Freq: Every day | ORAL | 1 refills | Status: DC

## 2023-06-27 NOTE — Telephone Encounter (Signed)
Medication has been refilled.

## 2023-06-29 NOTE — Progress Notes (Unsigned)
    Subjective:    Patient ID: Meghan Hickman, female    DOB: 09-27-1950, 72 y.o.   MRN: [id number]      HPI Meghan Hickman is here for No chief complaint on file.   Pain in side     Medications and allergies reviewed with patient and updated if appropriate.  Current Outpatient Medications on File Prior to Visit  Medication Sig Dispense Refill   almotriptan (AXERT) 12.5 MG tablet Take 1 tablet (12.5 mg total) by mouth daily as needed for migraine. 10 tablet 4   ALPRAZolam (XANAX) 1 MG tablet Take 1 tablet (1 mg total) by mouth 2 (two) times daily as needed for anxiety. 60 tablet 2   aspirin 81 MG chewable tablet Chew by mouth.     atorvastatin (LIPITOR) 20 MG tablet TAKE 1 TABLET BY MOUTH EVERY DAY 90 tablet 0   beclomethasone (QVAR REDIHALER) 80 MCG/ACT inhaler Inhale 1 puff into the lungs 2 (two) times daily. 1 each 11   blood glucose meter kit and supplies Dispense based on patient and insurance preference. Use up to three times daily as directed. (FOR ICD-10 E11.65). Dispense 100 lancets and test strips and 1 lancing device to match meter. 1 each 0   dapagliflozin propanediol (FARXIGA) 10 MG TABS tablet Take 1 tablet (10 mg total) by mouth daily before breakfast. 90 tablet 1   DULoxetine (CYMBALTA) 60 MG capsule TAKE 1 CAPSULE BY MOUTH EVERY DAY 90 capsule 1   famotidine (PEPCID) 40 MG tablet Take 1 tablet (40 mg total) by mouth daily. 90 tablet 3   gabapentin (NEURONTIN) 300 MG capsule TAKE 1 CAPSULE BY MOUTH THREE TIMES A DAY 270 capsule 0   ipratropium (ATROVENT) 0.03 % nasal spray Place 2 sprays into both nostrils 2 (two) times daily. 30 mL 0   KLOR-CON M20 20 MEQ tablet TAKE 1 TABLET BY MOUTH TWICE A DAY 180 tablet 0   levocetirizine (XYZAL) 5 MG tablet TAKE 1 TABLET BY MOUTH EVERY DAY IN THE EVENING 90 tablet 1   memantine (NAMENDA) 10 MG tablet Take 1 tablet (10 mg total) by mouth 2 (two) times daily. 180 tablet 0   metoprolol succinate (TOPROL-XL) 25 MG 24 hr tablet Take 1  tablet (25 mg total) by mouth daily. 90 tablet 1   Multiple Vitamins-Minerals (MULTIVITAMIN & MINERAL PO) Take 1 tablet by mouth daily.     ONETOUCH ULTRA test strip USE UP TO 3 TIMES A DAY AS DIRECTED 100 strip 2   traMADol (ULTRAM) 50 MG tablet TAKE 1 TABLET BY MOUTH EVERY 6 HOURS AS NEEDED. 65 tablet 3   traZODone (DESYREL) 50 MG tablet TAKE 1/2 TO 1 TABLET BY MOUTH AT BEDTIME AS NEEDED FOR SLEEP 90 tablet 2   No current facility-administered medications on file prior to visit.    Review of Systems     Objective:  There were no vitals filed for this visit. BP Readings from Last 3 Encounters:  04/20/23 136/84  03/08/23 124/80  12/07/22 126/86   Wt Readings from Last 3 Encounters:  04/20/23 147 lb (66.7 kg)  03/08/23 145 lb (65.8 kg)  12/07/22 148 lb (67.1 kg)   There is no height or weight on file to calculate BMI.    Physical Exam         Assessment & Plan:    See Problem List for Assessment and Plan of chronic medical problems.

## 2023-06-30 ENCOUNTER — Ambulatory Visit (INDEPENDENT_AMBULATORY_CARE_PROVIDER_SITE_OTHER): Payer: Medicare Other | Admitting: Internal Medicine

## 2023-06-30 ENCOUNTER — Ambulatory Visit: Payer: Medicare Other | Admitting: Internal Medicine

## 2023-06-30 ENCOUNTER — Encounter: Payer: Self-pay | Admitting: Internal Medicine

## 2023-06-30 ENCOUNTER — Ambulatory Visit (INDEPENDENT_AMBULATORY_CARE_PROVIDER_SITE_OTHER): Payer: Medicare Other

## 2023-06-30 VITALS — BP 128/68 | HR 64 | Temp 98.0°F | Ht 59.0 in | Wt 149.0 lb

## 2023-06-30 DIAGNOSIS — R0781 Pleurodynia: Secondary | ICD-10-CM

## 2023-06-30 DIAGNOSIS — R079 Chest pain, unspecified: Secondary | ICD-10-CM | POA: Diagnosis not present

## 2023-06-30 MED ORDER — MELOXICAM 15 MG PO TABS: 15.0000 mg | ORAL_TABLET | Freq: Every day | ORAL | 0 refills | Status: DC

## 2023-06-30 NOTE — Patient Instructions (Addendum)
     Have xrays downstairs    Medications changes include :   meloxicam 15 mg daily - take with food    A referral was ordered sports medicine and someone will call you to schedule an appointment.     Return if symptoms worsen or fail to improve.

## 2023-07-27 ENCOUNTER — Other Ambulatory Visit: Payer: Self-pay | Admitting: Internal Medicine

## 2023-07-27 DIAGNOSIS — E118 Type 2 diabetes mellitus with unspecified complications: Secondary | ICD-10-CM

## 2023-07-27 DIAGNOSIS — N1831 Chronic kidney disease, stage 3a: Secondary | ICD-10-CM

## 2023-07-28 ENCOUNTER — Ambulatory Visit: Payer: Medicare Other | Admitting: Internal Medicine

## 2023-07-30 ENCOUNTER — Other Ambulatory Visit: Payer: Self-pay | Admitting: Internal Medicine

## 2023-08-09 ENCOUNTER — Ambulatory Visit (INDEPENDENT_AMBULATORY_CARE_PROVIDER_SITE_OTHER): Payer: Medicare Other | Admitting: Internal Medicine

## 2023-08-09 ENCOUNTER — Encounter: Payer: Self-pay | Admitting: Internal Medicine

## 2023-08-09 VITALS — BP 138/86 | Temp 98.6°F | Resp 16 | Ht 59.0 in | Wt 146.8 lb

## 2023-08-09 DIAGNOSIS — I10 Essential (primary) hypertension: Secondary | ICD-10-CM

## 2023-08-09 DIAGNOSIS — E118 Type 2 diabetes mellitus with unspecified complications: Secondary | ICD-10-CM

## 2023-08-09 DIAGNOSIS — E785 Hyperlipidemia, unspecified: Secondary | ICD-10-CM | POA: Diagnosis not present

## 2023-08-09 DIAGNOSIS — R7989 Other specified abnormal findings of blood chemistry: Secondary | ICD-10-CM

## 2023-08-09 DIAGNOSIS — R748 Abnormal levels of other serum enzymes: Secondary | ICD-10-CM | POA: Insufficient documentation

## 2023-08-09 DIAGNOSIS — N1831 Chronic kidney disease, stage 3a: Secondary | ICD-10-CM | POA: Diagnosis not present

## 2023-08-09 DIAGNOSIS — K7581 Nonalcoholic steatohepatitis (NASH): Secondary | ICD-10-CM | POA: Diagnosis not present

## 2023-08-09 DIAGNOSIS — R1084 Generalized abdominal pain: Secondary | ICD-10-CM

## 2023-08-09 LAB — URINALYSIS, ROUTINE W REFLEX MICROSCOPIC
Hgb urine dipstick: NEGATIVE
Ketones, ur: NEGATIVE
Leukocytes,Ua: NEGATIVE
Nitrite: NEGATIVE
RBC / HPF: NONE SEEN
Specific Gravity, Urine: >=1.03 — AB (ref 1.000–1.030)
Urine Glucose: >=1000 — AB
Urobilinogen, UA: 0.2 (ref 0.0–1.0)
pH: 5.5 (ref 5.0–8.0)

## 2023-08-09 LAB — LIPID PANEL
Cholesterol: 193 mg/dL (ref 0–200)
HDL: 49.8 mg/dL
LDL Cholesterol: 127 mg/dL — ABNORMAL HIGH (ref 0–99)
NonHDL: 142.95
Total CHOL/HDL Ratio: 4
Triglycerides: 79 mg/dL (ref 0.0–149.0)
VLDL: 15.8 mg/dL (ref 0.0–40.0)

## 2023-08-09 LAB — CBC WITH DIFFERENTIAL/PLATELET
Basophils Absolute: 0 10*3/uL (ref 0.0–0.1)
Basophils Relative: 0.6 % (ref 0.0–3.0)
Eosinophils Absolute: 0.7 10*3/uL (ref 0.0–0.7)
Eosinophils Relative: 10.6 % — ABNORMAL HIGH (ref 0.0–5.0)
HCT: 40.5 % (ref 36.0–46.0)
Hemoglobin: 13.1 g/dL (ref 12.0–15.0)
Lymphocytes Relative: 25.1 % (ref 12.0–46.0)
Lymphs Abs: 1.8 10*3/uL (ref 0.7–4.0)
MCHC: 32.3 g/dL (ref 30.0–36.0)
MCV: 85 fL (ref 78.0–100.0)
Monocytes Absolute: 0.5 10*3/uL (ref 0.1–1.0)
Monocytes Relative: 6.5 % (ref 3.0–12.0)
Neutro Abs: 4 10*3/uL (ref 1.4–7.7)
Neutrophils Relative %: 57.2 % (ref 43.0–77.0)
Platelets: 380 10*3/uL (ref 150.0–400.0)
RBC: 4.76 Mil/uL (ref 3.87–5.11)
RDW: 13.9 % (ref 11.5–15.5)
WBC: 7.1 10*3/uL (ref 4.0–10.5)

## 2023-08-09 LAB — HEPATIC FUNCTION PANEL
ALT: 117 U/L — ABNORMAL HIGH (ref 0–35)
AST: 133 U/L — ABNORMAL HIGH (ref 0–37)
Albumin: 4.1 g/dL (ref 3.5–5.2)
Alkaline Phosphatase: 433 U/L — ABNORMAL HIGH (ref 39–117)
Bilirubin, Direct: 0.2 mg/dL (ref 0.0–0.3)
Total Bilirubin: 0.9 mg/dL (ref 0.2–1.2)
Total Protein: 9.1 g/dL — ABNORMAL HIGH (ref 6.0–8.3)

## 2023-08-09 LAB — PROTIME-INR
INR: 1.2 {ratio} — ABNORMAL HIGH (ref 0.8–1.0)
Prothrombin Time: 12.6 s (ref 9.6–13.1)

## 2023-08-09 LAB — BASIC METABOLIC PANEL
BUN: 8 mg/dL (ref 6–23)
CO2: 29 meq/L (ref 19–32)
Calcium: 9.5 mg/dL (ref 8.4–10.5)
Chloride: 102 meq/L (ref 96–112)
Creatinine, Ser: 0.7 mg/dL (ref 0.40–1.20)
GFR: 85.78 mL/min (ref >60.00)
Glucose, Bld: 124 mg/dL — ABNORMAL HIGH (ref 70–99)
Potassium: 3.9 meq/L (ref 3.5–5.1)
Sodium: 140 meq/L (ref 135–145)

## 2023-08-09 LAB — MICROALBUMIN / CREATININE URINE RATIO
Creatinine,U: 180.2 mg/dL
Microalb Creat Ratio: 1.7 mg/g (ref 0.0–30.0)
Microalb, Ur: 3.1 mg/dL — ABNORMAL HIGH (ref 0.0–1.9)

## 2023-08-09 LAB — AMYLASE: Amylase: 42 U/L (ref 27–131)

## 2023-08-09 LAB — HEMOGLOBIN A1C: Hgb A1c MFr Bld: 6.2 % (ref 4.6–6.5)

## 2023-08-09 LAB — LIPASE: Lipase: 26 U/L (ref 11.0–59.0)

## 2023-08-09 NOTE — Patient Instructions (Signed)
 Abdominal Pain, Adult  Pain in the abdomen (abdominal pain) can be caused by many things. In most cases, it gets better with no treatment or by being treated at home. But in some cases, it can be serious. Your health care provider will ask questions about your medical history and do a physical exam to try to figure out what is causing your pain. Follow these instructions at home: Medicines Take over-the-counter and prescription medicines only as told by your provider. Do not take medicines that help you poop (laxatives) unless told by your provider. General instructions Watch your condition for any changes. Drink enough fluid to keep your pee (urine) pale yellow. Contact a health care provider if: Your pain changes, gets worse, or lasts longer than expected. You have severe cramping or bloating in your abdomen, or you vomit. Your pain gets worse with meals, after eating, or with certain foods. You are constipated or have diarrhea for more than 2-3 days. You are not hungry, or you lose weight without trying. You have signs of dehydration. These may include: Dark pee, very little pee, or no pee. Cracked lips or dry mouth. Sleepiness or weakness. You have pain when you pee (urinate) or poop. Your abdominal pain wakes you up at night. You have blood in your pee. You have a fever. Get help right away if: You cannot stop vomiting. Your pain is only in one part of the abdomen. Pain on the right side could be caused by appendicitis. You have bloody or black poop (stool), or poop that looks like tar. You have trouble breathing. You have chest pain. These symptoms may be an emergency. Get help right away. Call 911. Do not wait to see if the symptoms will go away. Do not drive yourself to the hospital. This information is not intended to replace advice given to you by your health care provider. Make sure you discuss any questions you have with your health care provider. Document Revised:  05/12/2022 Document Reviewed: 05/12/2022 Elsevier Patient Education  2024 [employer].

## 2023-08-09 NOTE — Progress Notes (Signed)
 Subjective:  Patient ID: Meghan Hickman, female    DOB: 11-Nov-1950  Age: 72 y.o. MRN: [id number]  CC: Hypertension, Hyperlipidemia, Diabetes, and Abdominal Pain   HPI Meghan Hickman presents for f/up -----   Discussed the use of AI scribe software for clinical note transcription with the patient, who gave verbal consent to proceed.  History of Present Illness   The patient, with a history of thyroid  nodule and suspected prescription drug addiction, presented with new onset abdominal pain reported this morning. She also reported experiencing nausea and vomiting. The patient's friend, who has known her for 50 years and assists with medical appointments, was unsure if the patient was currently smoking cigarettes.  The patient's medication regimen is unknown as she has been resistant to sharing this information. She is currently undergoing a Medicare assessment, and home health has been involved. The patient has been resistant to having her medications organized by others, insisting on managing them independently.  The patient recently had an MRI due to reported side pain.    The patient also has a known thyroid  nodule. She has been recommended to see an ENT surgeon, but has been resistant to this suggestion, possibly due to a reluctance to undergo surgery.       Outpatient Medications Prior to Visit  Medication Sig Dispense Refill   almotriptan  (AXERT ) 12.5 MG tablet Take 1 tablet (12.5 mg total) by mouth daily as needed for migraine. 10 tablet 4   ALPRAZolam  (XANAX ) 1 MG tablet Take 1 tablet (1 mg total) by mouth 2 (two) times daily as needed for anxiety. 60 tablet 2   aspirin  81 MG chewable tablet Chew by mouth.     atorvastatin  (LIPITOR) 20 MG tablet TAKE 1 TABLET BY MOUTH EVERY DAY 90 tablet 0   beclomethasone (QVAR  REDIHALER) 80 MCG/ACT inhaler Inhale 1 puff into the lungs 2 (two) times daily. 1 each 11   blood glucose meter kit and supplies Dispense based on patient and insurance  preference. Use up to three times daily as directed. (FOR ICD-10 E11.65). Dispense 100 lancets and test strips and 1 lancing device to match meter. 1 each 0   dapagliflozin  propanediol (FARXIGA ) 10 MG TABS tablet TAKE 1 TABLET BY MOUTH DAILY BEFORE BREAKFAST. 90 tablet 0   DULoxetine  (CYMBALTA ) 60 MG capsule TAKE 1 CAPSULE BY MOUTH EVERY DAY 90 capsule 1   famotidine  (PEPCID ) 40 MG tablet Take 1 tablet (40 mg total) by mouth daily. 90 tablet 3   gabapentin  (NEURONTIN ) 300 MG capsule TAKE 1 CAPSULE BY MOUTH THREE TIMES A DAY 270 capsule 0   ipratropium (ATROVENT ) 0.03 % nasal spray Place 2 sprays into both nostrils 2 (two) times daily. 30 mL 0   KLOR-CON  M20 20 MEQ tablet TAKE 1 TABLET BY MOUTH TWICE A DAY 180 tablet 0   levocetirizine (XYZAL ) 5 MG tablet TAKE 1 TABLET BY MOUTH EVERY DAY IN THE EVENING 90 tablet 1   meloxicam  (MOBIC ) 15 MG tablet TAKE 1 TABLET BY MOUTH EVERY DAY WITH FOOD 30 tablet 0   memantine  (NAMENDA ) 10 MG tablet Take 1 tablet (10 mg total) by mouth 2 (two) times daily. 180 tablet 0   metoprolol  succinate (TOPROL -XL) 25 MG 24 hr tablet Take 1 tablet (25 mg total) by mouth daily. 90 tablet 1   Multiple Vitamins-Minerals (MULTIVITAMIN & MINERAL PO) Take 1 tablet by mouth daily.     ONETOUCH ULTRA test strip USE UP TO 3 TIMES A DAY AS DIRECTED 100  strip 2   rivastigmine (EXELON) 4.5 MG capsule Take 4.5 mg by mouth 2 (two) times daily.     traMADol  (ULTRAM ) 50 MG tablet TAKE 1 TABLET BY MOUTH EVERY 6 HOURS AS NEEDED. 65 tablet 3   traZODone  (DESYREL ) 50 MG tablet TAKE 1/2 TO 1 TABLET BY MOUTH AT BEDTIME AS NEEDED FOR SLEEP 90 tablet 2   No facility-administered medications prior to visit.    ROS Review of Systems  Constitutional:  Negative for appetite change, chills, diaphoresis and fatigue.  HENT: Negative.    Respiratory: Negative.  Negative for chest tightness, shortness of breath and wheezing.   Cardiovascular:  Negative for chest pain, palpitations and leg swelling.   Gastrointestinal:  Positive for abdominal pain, nausea and vomiting. Negative for blood in stool, constipation and diarrhea.  Genitourinary:  Negative for difficulty urinating, dysuria and pelvic pain.  Musculoskeletal: Negative.   Skin: Negative.   Neurological:  Positive for speech difficulty. Negative for dizziness, weakness and light-headedness.  Hematological:  Negative for adenopathy. Does not bruise/bleed easily.  Psychiatric/Behavioral:  Positive for confusion and decreased concentration. Negative for sleep disturbance. The patient is not nervous/anxious.     Objective:  BP 138/86 (BP Location: Left Arm, Patient Position: Sitting, Cuff Size: Normal)   Temp 98.6 F (37 C) (Oral)   Resp 16   Ht 4' 11 (1.499 m)   Wt 146 lb 12.8 oz (66.6 kg)   SpO2 95%   BMI 29.65 kg/m   BP Readings from Last 3 Encounters:  08/09/23 138/86  06/30/23 128/68  04/20/23 136/84    Wt Readings from Last 3 Encounters:  08/09/23 146 lb 12.8 oz (66.6 kg)  06/30/23 149 lb (67.6 kg)  04/20/23 147 lb (66.7 kg)    Physical Exam Vitals reviewed.  Constitutional:      General: She is not in acute distress.    Appearance: She is not ill-appearing, toxic-appearing or diaphoretic.  HENT:     Nose: Nose normal.     Mouth/Throat:     Mouth: Mucous membranes are moist.  Eyes:     General: No scleral icterus.    Conjunctiva/sclera: Conjunctivae normal.  Neck:     Thyroid : Thyroid  mass present. No thyromegaly or thyroid  tenderness.     Comments: Right thyroid  mass Cardiovascular:     Rate and Rhythm: Normal rate and regular rhythm.     Heart sounds: No murmur heard.    No friction rub. No gallop.  Pulmonary:     Effort: Pulmonary effort is normal.     Breath sounds: No stridor. No wheezing, rhonchi or rales.  Abdominal:     General: Abdomen is protuberant. There is no distension.     Palpations: There is no hepatomegaly, splenomegaly or mass.     Tenderness: There is generalized abdominal  tenderness. There is no guarding or rebound.  Musculoskeletal:        General: Normal range of motion.     Cervical back: Neck supple.     Right lower leg: No edema.     Left lower leg: No edema.  Skin:    General: Skin is warm and dry.     Findings: No rash.  Neurological:     General: No focal deficit present.     Mental Status: She is alert and oriented to person, place, and time.  Psychiatric:        Attention and Perception: She is inattentive.        Mood and Affect:  Mood normal.        Speech: She is noncommunicative.        Thought Content: Thought content normal.        Cognition and Memory: Cognition normal.     Lab Results  Component Value Date   WBC 7.1 08/09/2023   HGB 13.1 08/09/2023   HCT 40.5 08/09/2023   PLT 380.0 08/09/2023   GLUCOSE 124 (H) 08/09/2023   CHOL 193 08/09/2023   TRIG 79.0 08/09/2023   HDL 49.80 08/09/2023   LDLCALC 127 (H) 08/09/2023   ALT 117 (H) 08/09/2023   AST 133 (H) 08/09/2023   NA 140 08/09/2023   K 3.9 08/09/2023   CL 102 08/09/2023   CREATININE 0.70 08/09/2023   BUN 8 08/09/2023   CO2 29 08/09/2023   TSH 1.09 04/20/2023   INR 1.2 (H) 08/09/2023   HGBA1C 6.2 08/09/2023   MICROALBUR 3.1 (H) 08/09/2023    MR Abdomen W Wo Contrast Result Date: 05/03/2023 CLINICAL DATA:  NASH, abdominal pain EXAM: MRI ABDOMEN WITHOUT AND WITH CONTRAST TECHNIQUE: Multiplanar multisequence MR imaging of the abdomen was performed both before and after the administration of intravenous contrast. CONTRAST:  10 mL Vueway  gadolinium contrast IV COMPARISON:  CT abdomen pelvis, 12/03/2017 FINDINGS: Lower chest: No acute abnormality. Hepatobiliary: No solid liver abnormality is seen. No measurable steatosis. Normal liver size. Occasional benign fluid signal cysts, requiring no further follow-up or characterization. Status post cholecystectomy. No biliary dilatation. Pancreas: Unremarkable. No pancreatic ductal dilatation or surrounding inflammatory changes.  Spleen: Normal in size without significant abnormality. Adrenals/Urinary Tract: Adrenal glands are unremarkable. Simple, benign bilateral renal cortical cysts, for which no further follow-up or characterization is required. Kidneys are otherwise normal, without obvious renal calculi, solid lesion, or hydronephrosis. Stomach/Bowel: Stomach is within normal limits. No evidence of bowel wall thickening, distention, or inflammatory changes. Pancolonic diverticulosis. Vascular/Lymphatic: No significant vascular findings are present. No enlarged abdominal lymph nodes. Other: No abdominal wall hernia or abnormality. No ascites. Musculoskeletal: No acute or significant osseous findings. S shaped scoliosis of the thoracolumbar spine. IMPRESSION: 1. No acute MR findings of the abdomen to explain pain. 2. No apparent hepatic steatosis on this non quantitative examination. Normal liver size. 3. Status post cholecystectomy. 4. Pancolonic diverticulosis. Electronically Signed   By: Marolyn JONETTA Jaksch M.D.   On: 05/03/2023 17:03    Assessment & Plan:   Hyperlipidemia with target LDL less than 130- LFT's are elevated. Will hold the statin for now. -     Lipid panel; Future -     Hepatic function panel; Future  Primary hypertension -     Urinalysis, Routine w reflex microscopic; Future -     CBC with Differential/Platelet; Future -     Basic metabolic panel; Future  Stage 3a chronic kidney disease (HCC)- Will avoid nephrotoxic agents  -     Microalbumin / creatinine urine ratio; Future -     Urinalysis, Routine w reflex microscopic; Future -     Basic metabolic panel; Future  NASH (nonalcoholic steatohepatitis) -     Hepatic function panel; Future -     Protime-INR; Future  Type II diabetes mellitus with manifestations (HCC)- Blood sugar is well controlled. -     Hemoglobin A1c; Future -     Basic metabolic panel; Future  Abdominal pain, generalized -     Lipase; Future -     Amylase; Future  Alkaline  phosphatase elevation -     Alkaline phosphatase, isoenzymes; Future -  Ambulatory referral to Gastroenterology  Elevated LFTs -     AntiMicrosomal Ab-Liver / Kidney; Future -     Anti-smooth muscle antibody, IgG; Future -     ANA; Future -     Mitochondrial/smooth muscle ab pnl; Future     Follow-up: Return in about 3 months (around 11/07/2023).  Debby Molt, MD

## 2023-09-12 ENCOUNTER — Other Ambulatory Visit: Payer: Self-pay | Admitting: Internal Medicine

## 2023-09-12 DIAGNOSIS — G301 Alzheimer's disease with late onset: Secondary | ICD-10-CM

## 2023-09-26 ENCOUNTER — Ambulatory Visit: Payer: Self-pay | Admitting: Internal Medicine

## 2023-09-26 NOTE — Telephone Encounter (Signed)
  Chief Complaint: weight loss Symptoms: weight loss, possible trouble swallowing Frequency: 2 weeks Pertinent Negatives: Patient denies fever, changes in urine/bowel habits Disposition: [] ED /[] Urgent Care (no appt availability in office) / [] Appointment(In office/virtual)/ []  [facility]/ [] Home Care/ [] Refused Recommended Disposition /[] [facility]/ []  Follow-up with PCP Additional Notes: Patients friend calls in and stated that patient has been losing weight over the last 2 weeks. Friend is unsure of exact amount but states it is "noticeable when you look at her". Patient has hx of aphasia and is unable to talk, but friend states that aphasia has been worsening and thinks this is related to patients weight loss. Friend is unsure if patient is having difficulty swallowing as a result of the worsening aphasia and is requesting this be evaluated, as they are trying to find her placement in a facility. Per protocol, appt scheduled tomorrow 2/18 in office. Friend advised to call back with worsening symptoms. Friend verbalized understanding.    Copied from CRM [id number]. Topic: Clinical - Red Word Triage >> Sep 26, 2023  9:42 AM Mackie Pai E wrote: [facility] that prompted transfer to Nurse Triage: Patient's friend, Vira Browns, on the line stating that the patient has aphasia and that she cannot open her mouth to eat, so they are worried about her losing weight, been going on the past 2.5 weeks. Reason for Disposition  [1] Continued weight loss AND [2] after medical evaluation by doctor (or NP/PA)  Answer Assessment - Initial Assessment Questions 1. MAIN CONCERN: "What is your main concern today?"     Weight loss, patient either not eating or unable to swallow 2. WEIGHT LOSS: "How much weight have you lost?"  (e.g., lbs., kgs.)  "Over what period of time have you lost this weight?"  (e.g., number of days, weeks, months, years)     "I'm not sure of the exact amount but it is  noticeable when you look at her" 3. BASELINE WEIGHT: "What is your baseline or normal weight?" (e.g., "How much do you usually weigh?")     146 lbs 4. CAUSE: "What do you think is causing the weight loss?" (e.g., depression, anxiety, medicine side effect, pain, trouble swallowing, substance or alcohol use problem, eating disorder)     Aphasia is getting worse 5. PRIOR EVALUATION: "Have you been evaluated by a doctor for your weight loss?" If Yes, ask "When was your last visit?" "What did your doctor (or NP/PA) tell you about the possible cause?"     none 6. HEART FAILURE TREATMENT: "Do you have heart failure?" If Yes, ask: "Have you taken new or extra water pills (diuretics) recently?" (e.g., furosemide; bumetanide). "What is your target weight?"     none 7. OTHER SYMPTOMS: "Do you have any other symptoms?" (e.g., anxiety or depression, blood in stool, breathing difficulty, diarrhea, fever, trouble swallowing)     Trouble swallowing or lack of appetite  Protocols used: Weight Loss - Unintended-A-AH

## 2023-09-26 NOTE — Progress Notes (Addendum)
 Acute Office Visit  Subjective:     Patient ID: Meghan Hickman, female    DOB: 1951/02/19, 73 y.o.   MRN: [id number]  Chief Complaint  Patient presents with   Acute Visit    Swallowing difficulty, cannot open her mouth very wide. Friend noticed a couple weeks ago when they went out to eat. More confused state    HPI Patient is in today for evaluation of worsening aphagia and weight loss over the last 2 weeks.  Carlo [name] (friend) is acting as historian today. She has noticed that pt looks like she has lost weight over the last 2 weeks.  Reports that pt seems more confused, is unable to communicate as well as previously. Is typing statements, but Carlo cannot discern their meaning very much anymore.  Has not seen neuro about this. Has lost 9# since last visit on 08/08/24 Very difficult to reconcile medications and obtain much of any history. Pt lives alone and manages her own medications and meals. Friend Carlo concerned that pt is not eating or drinking very much.    ROS Per HPI      Objective:    BP (!) 140/78 (BP Location: Left Arm, Patient Position: Sitting, Cuff Size: Normal)   Pulse 94   Temp 98.4 F (36.9 C) (Oral)   Ht 4\' 11"  (1.499 m)   Wt 137 lb 9.6 oz (62.4 kg)   SpO2 96%   BMI 27.79 kg/m    Physical Exam Vitals and nursing note reviewed.  Constitutional:      General: She is not in acute distress.    Appearance: Normal appearance. She is normal weight.  HENT:     Head: Normocephalic and atraumatic.     Nose: Nose normal.     Mouth/Throat:     Comments: Unable to examine, ROM severely limited Eyes:     Extraocular Movements: Extraocular movements intact.     Pupils: Pupils are equal, round, and reactive to light.  Neck:     Comments: Large R thyroid nodule, non tender Cardiovascular:     Rate and Rhythm: Normal rate and regular rhythm.     Heart sounds: Normal heart sounds.  Pulmonary:     Effort: Pulmonary effort is normal. No respiratory  distress.     Breath sounds: Normal breath sounds. No wheezing, rhonchi or rales.  Musculoskeletal:     Cervical back: Normal range of motion.  Skin:    General: Skin is warm and dry.  Neurological:     General: No focal deficit present.     Mental Status: She is alert.  Psychiatric:     Comments: Looking around when being spoken to, she is not able to answer yes or no questions consistently.     No results found for any visits on 09/27/23.      Assessment & Plan:  1. Primary progressive aphasia (HCC) (Primary)  - AMB Referral [facility] Care Management - Discussed to f/u with neuro - Concern for pt inability to open her mouth and eat  2. Moderate episode of recurrent major depressive disorder (HCC)  - AMB Referral [facility] Care Management  3. Stage 3a chronic kidney disease (HCC)  - AMB Referral [facility] Care Management  4. Type II diabetes mellitus with manifestations (HCC)  - AMB Referral [facility] Care Management  5. Primary hypertension  - AMB Referral [facility] Care Management  6. Mild late onset Alzheimer's dementia without behavioral disturbance, psychotic disturbance, mood disturbance, or anxiety (HCC)  -  AMB Referral [facility] Care Management  7. Weight loss  - CBC with Differential/Platelet; Future - Comprehensive metabolic panel; Future - Urinalysis; Future - AMB Referral [facility] Care Management  8. Loss of appetite  - CBC with Differential/Platelet; Future - Comprehensive metabolic panel; Future - Urinalysis; Future - AMB Referral [facility] Care Management  Concern for ability to care for self independently.  Home Health skilled nurse eval, ST, PT,  OT  evals needed  No orders of the defined types were placed in this encounter.   Return if symptoms worsen or fail to improve.  Moshe Cipro, FNP

## 2023-09-27 ENCOUNTER — Ambulatory Visit (INDEPENDENT_AMBULATORY_CARE_PROVIDER_SITE_OTHER): Payer: Medicare Other | Admitting: Family Medicine

## 2023-09-27 ENCOUNTER — Encounter: Payer: Self-pay | Admitting: Family Medicine

## 2023-09-27 VITALS — BP 140/78 | HR 94 | Temp 98.4°F | Ht 59.0 in | Wt 137.6 lb

## 2023-09-27 DIAGNOSIS — F02A0 Dementia in other diseases classified elsewhere, mild, without behavioral disturbance, psychotic disturbance, mood disturbance, and anxiety: Secondary | ICD-10-CM

## 2023-09-27 DIAGNOSIS — G3101 Pick's disease: Secondary | ICD-10-CM | POA: Diagnosis not present

## 2023-09-27 DIAGNOSIS — G301 Alzheimer's disease with late onset: Secondary | ICD-10-CM

## 2023-09-27 DIAGNOSIS — F331 Major depressive disorder, recurrent, moderate: Secondary | ICD-10-CM

## 2023-09-27 DIAGNOSIS — E118 Type 2 diabetes mellitus with unspecified complications: Secondary | ICD-10-CM

## 2023-09-27 DIAGNOSIS — R634 Abnormal weight loss: Secondary | ICD-10-CM

## 2023-09-27 DIAGNOSIS — I10 Essential (primary) hypertension: Secondary | ICD-10-CM | POA: Diagnosis not present

## 2023-09-27 DIAGNOSIS — N1831 Chronic kidney disease, stage 3a: Secondary | ICD-10-CM | POA: Diagnosis not present

## 2023-09-27 DIAGNOSIS — R630 Anorexia: Secondary | ICD-10-CM

## 2023-09-27 DIAGNOSIS — F0280 Dementia in other diseases classified elsewhere without behavioral disturbance: Secondary | ICD-10-CM

## 2023-09-27 NOTE — Patient Instructions (Signed)
 We are checking labs today, will be in contact with any results that require further attention  I will order speech therapy come see you as well.   Please follow up with neurology as soon as you can

## 2023-09-29 ENCOUNTER — Telehealth: Payer: Self-pay | Admitting: *Deleted

## 2023-09-29 NOTE — Progress Notes (Signed)
 Complex Care Management Note  Care Guide Note 09/29/2023 Name: CAHTERINE HEINZEL MRN: [id number] DOB: 1950/12/22  Doralee Albino Hunke is a 73 y.o. year old female who sees Etta Grandchild, MD for primary care. I reached out to [name] by phone today to offer complex care management services.  Ms. Sarwar was given information about Complex Care Management services today including:   The Complex Care Management services include support from the care team which includes your Nurse Care Manager, Clinical Social Worker, or Pharmacist.  The Complex Care Management team is here to help remove barriers to the health concerns and goals most important to you. Complex Care Management services are voluntary, and the patient may decline or stop services at any time by request to their care team member.   Complex Care Management Consent Status: Patient agreed to services and verbal consent obtained.   Follow up plan:  Telephone appointment with complex care management team member scheduled for:  10/04/2023  Encounter Outcome:  Patient Scheduled  Burman Nieves, CMA, Care Guide [facility]  [facility], [facility] Guide Direct Dial: [phone]  Fax: [phone] Website: [email or web address]

## 2023-10-04 ENCOUNTER — Ambulatory Visit: Payer: Self-pay

## 2023-10-04 ENCOUNTER — Other Ambulatory Visit: Payer: Self-pay | Admitting: Internal Medicine

## 2023-10-04 ENCOUNTER — Ambulatory Visit: Payer: Self-pay | Admitting: Licensed Clinical Social Worker

## 2023-10-04 DIAGNOSIS — F0280 Dementia in other diseases classified elsewhere without behavioral disturbance: Secondary | ICD-10-CM

## 2023-10-04 DIAGNOSIS — G301 Alzheimer's disease with late onset: Secondary | ICD-10-CM

## 2023-10-04 DIAGNOSIS — J301 Allergic rhinitis due to pollen: Secondary | ICD-10-CM

## 2023-10-04 NOTE — Patient Instructions (Signed)
 Visit Information  Thank you for taking time to visit with me today. Please don't hesitate to contact me if I can be of assistance to you.   Following are the goals we discussed today:   Goals Addressed             This Visit's Progress    care coordination       Communicate with POA and pt medical team for care and support.  Arrange neurologist visit for pt.  Engage in stress reducing activity to alleviate caregiver stress         Our next appointment is by telephone on 10/18/2023 at 1045am  Please call the care guide team at [phone] if you need to cancel or reschedule your appointment.   If you are experiencing a Mental Health or Behavioral Health Crisis or need someone to talk to, please call 911   Patient verbalizes understanding of instructions and care plan provided today and agrees to view in MyChart. Active MyChart status and patient understanding of how to access instructions and care plan via MyChart confirmed with patient.     Telephone follow up appointment with care management team member scheduled for:  Gwyndolyn Saxon MSW, LCSW Licensed Clinical Social Worker  [facility], Population Health Direct Dial: [phone]  Fax: [phone]

## 2023-10-04 NOTE — Patient Outreach (Signed)
 Care Coordination   Initial Visit Note   10/04/2023 Name: HANH KERTESZ MRN: [id number] DOB: 1951-02-01  Doralee Albino Dicostanzo is a 73 y.o. year old female who sees Etta Grandchild, MD for primary care. I spoke with  [name] caregiver Carlo [name] by phone today.  What matters to the patients health and wellness today?  Support options and caregiver stress.     Goals Addressed             This Visit's Progress    care coordination       Communicate with POA and pt medical team for care and support.  Arrange neurologist visit for pt.  Engage in stress reducing activity to alleviate caregiver stress         SDOH assessments and interventions completed:  No     Care Coordination Interventions:  Yes, provided  Interventions Today    Flowsheet Row Most Recent Value  General Interventions   General Interventions Discussed/Reviewed General Interventions Reviewed, Level of Care  [Discuss placement process with caregiver Carlo [name]. [name] reports pt L Kalla POA resides in [place], [place] and pt lives alone. [name] reports family will decide when pt will relocated to snf]  Level of Care Assisted Living  Mental Health Interventions   Mental Health Discussed/Reviewed Mental Health Reviewed, Coping Strategies  [provided caregiver support]       Follow up plan: Follow up call scheduled for 10/18/2023    Encounter Outcome:  Patient Visit Completed   Gwyndolyn Saxon MSW, LCSW Licensed Clinical Social Worker  [facility], Population Health Direct Dial: [phone]  Fax: [phone]

## 2023-10-04 NOTE — Patient Outreach (Unsigned)
 Care Coordination   Initial Visit Note   10/04/2023 Name: Meghan Hickman MRN: [id number] DOB: [birth year]  Meghan Hickman is a 73 y.o. year old female who sees Meghan Grandchild, MD for primary care. I spoke with Meghan Hickman (friend) by phone today. Per friend and review of chart, Patient with dementia and aphasic.  What matters to the patients health and wellness today? Ms. Katrinka Hickman reports, patient lives alone. And does not go to patient's house all the time, but reports she takes patient to her provider appointments. 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 Last office visit with primary care provider was 09/27/23. Ms. Katrinka Hickman expressed concern that higher level of care needed. And states that she has been looking into Asisted Living facilities and [place] looking into [facility] in communication with the POA, Meghan Hickman, who resides in [place] and will be here to assist patient with needs in about two weeks. Per Ms. Katrinka Hickman, she has scheduled an appointment with patient's neurologist, Dr. Cristopher Hickman [facility]) for next week 10/12/23 at 2 pm in [place].  Ms. Katrinka Hickman states she has spoken with Meghan Saxon, LCSW, regarding patient's status and level of care concerns.        Goals Addressed             This Visit's Progress    Care Coordination Activities appropriate support and/or level of care for health management       Interventions Today    Flowsheet Row Most Recent Value  Chronic Disease   Chronic disease during today's visit Other  [Aphasia, dementia]  General Interventions   General Interventions Discussed/Reviewed General Interventions Discussed, Communication with, Level of Care, Doctor Visits  [Evaluation of current treatment plan for health condition and patient's adherence to plan.]  Doctor Visits Discussed/Reviewed Doctor Visits Discussed, PCP, Specialist  PCP/Specialist Visits Compliance with follow-up visit  [reviewed upcoming appointments including  neurologist appointment scheduled for next week. confirmed patient has transporation, friend Meghan Hickman will be taking patient.]  Communication with Social Work  Level of Care Assisted Living  [Confirmed that Ms Katrinka Hickman has spoken with LCSW today regarding possible assisted living/memory care needs]  Education Interventions   Education Provided Provided Education  Provided Verbal Education On When to see the doctor  [advised to continue to monitor, contact provider with health questions or concerns. advised can contact the number on the back of patient's  insurance card re: any OTC items.]  Nutrition Interventions   Nutrition Discussed/Reviewed Nutrition Discussed  Pearletha Furl for food insecurities, discussed food intake with patient's friend. per friend last meal patient ate well.]  Pharmacy Interventions   Pharmacy Dicussed/Reviewed Pharmacy Topics Discussed, Referral to Pharmacist  [unable to review medications. per Meghan, patient manages her own medications. advised Ms. Katrinka Hickman to bring medications with her to patient's appointment with neurology. pharmacy referral]             SDOH assessments and interventions completed:  Yes{THN Tip this will not be part of the note when signed-REQUIRED REPORT FIELD DO NOT DELETE (Optional):27901}  SDOH Interventions Today    Flowsheet Row Most Recent Value  SDOH Interventions   Food Insecurity Interventions Intervention Not Indicated  Housing Interventions Intervention Not Indicated  Transportation Interventions Intervention Not Indicated  Utilities Interventions Intervention Not Indicated     Care Coordination Interventions:  Yes, provided {THN Tip this will not be part of the note when signed-REQUIRED REPORT FIELD DO NOT DELETE (Optional):27901}  Follow up plan: Follow  up call scheduled for 10/14/23    Encounter Outcome:  Patient Visit Completed {THN Tip this will not be part of the note when signed-REQUIRED REPORT FIELD DO NOT DELETE  (Optional):27901}  Kathyrn Sheriff, RN, MSN, BSN, CCM [place]  [facility], Population Health Case Manager Phone: [phone]

## 2023-10-05 NOTE — Patient Instructions (Signed)
 Visit Information  Thank you for taking time to visit with me today. Please don't hesitate to contact me if I can be of assistance to you.   Following are the goals we discussed today:  Please bring your medications with you to your next provider appointment Please attend provider visits as scheduled  Our next appointment is by telephone on 10/14/23 at 10 am  Please call the care guide team at [phone] if you need to cancel or reschedule your appointment.   If you are experiencing a Mental Health or Behavioral Health Crisis or need someone to talk to, please call the Suicide and Crisis Lifeline: 988 call the [place] National Suicide Prevention Lifeline: 8150761004 or TTY: 760-433-8473 TTY 432-326-0958) to talk to a trained counselor   Kathyrn Sheriff, RN, MSN, BSN, CCM [place]  [facility], Population Health Case Manager Phone: [phone]

## 2023-10-13 ENCOUNTER — Ambulatory Visit: Payer: Self-pay | Admitting: Licensed Clinical Social Worker

## 2023-10-13 ENCOUNTER — Ambulatory Visit: Payer: Self-pay

## 2023-10-13 NOTE — Patient Outreach (Signed)
 Care Coordination   Care coordination   Visit Note   10/13/2023 Name: Meghan Hickman MRN: [id number] DOB: Nov 22, 1950  Meghan Hickman is a 73 y.o. year old female who sees Meghan Grandchild, MD for primary care.  What matters to the patients health and wellness today?  Care Coordination: RNCM received voice message from [facility], stating that patient's POA would like a phone call regarding level of care needs/care needs. [name] request a call to patient's POA, Meghan Hatchet [phone]).   Goals Addressed             This Visit's Progress    Care Coordination Activities appropriate support and/or level of care for health management       Interventions Today    Flowsheet Row Most Recent Value  General Interventions   General Interventions Discussed/Reviewed Communication with  Communication with Social Work  Conservator, museum/gallery coordination re: level of care needs, provided contact number for POA received from [employer], friend]             SDOH assessments and interventions completed:  No  Care Coordination Interventions:  Yes, provided   Follow up plan:  as previously scheduled    Encounter Outcome:  Patient Visit Completed   Kathyrn Sheriff, RN, MSN, BSN, CCM [place]  [facility], Population Health Case Manager Phone: [phone]

## 2023-10-13 NOTE — Patient Outreach (Signed)
 Care Coordination   Follow Up Visit Note   10/13/2023 Name: Meghan Hickman MRN: [id number] DOB: 21-Jun-1951  Meghan Hickman is a 73 y.o. year old female who sees Etta Grandchild, MD for primary care. I spoke with  Meghan Hickman POA Meghan Hickman by phone today.  What matters to the patients health and wellness today?  Provided information regarding PCA services. POA has contact information for [facility] and [facility] for PCA services.     Goals Addressed             This Visit's Progress    care coordination       Communicate with POA and pt medical team for care and support.  Arrange neurologist visit for pt.  Engage in stress reducing activity to alleviate caregiver stress  Advise pt POA to contact [facility] and [facility] for PCA services.  FL2 faxed to [facility] neurology Dr Sherryll Burger [phone]        SDOH assessments and interventions completed:  Yes  SDOH Interventions Today    Flowsheet Row Most Recent Value  SDOH Interventions   Food Insecurity Interventions Intervention Not Indicated  Transportation Interventions Intervention Not Indicated  Utilities Interventions Intervention Not Indicated        Care Coordination Interventions:  Yes, provided   Interventions Today    Flowsheet Row Most Recent Value  General Interventions   General Interventions Discussed/Reviewed Level of Care  Level of Care Assisted Living  [Power of Atty Meghan Hickman reports placement is pending for pt however she needs pca services until placement is complete.]       Follow up plan: Follow up call scheduled for 10/18/2023    Encounter Outcome:  Patient Visit Completed   Gwyndolyn Saxon MSW, LCSW Licensed Clinical Social Worker  [facility], Population Health Direct Dial: [phone]  Fax: [phone]

## 2023-10-13 NOTE — Patient Instructions (Signed)
 Visit Information  Thank you for taking time to visit with me today. Please don't hesitate to contact me if I can be of assistance to you.   Following are the goals we discussed today:   Goals Addressed             This Visit's Progress    care coordination       Communicate with POA and pt medical team for care and support.  Arrange neurologist visit for pt.  Engage in stress reducing activity to alleviate caregiver stress  Advise pt POA to contact [facility] and [facility] for PCA services.  FL2 faxed to [facility] neurology Dr Sherryll Burger [phone]        Our next appointment is by telephone on 10/18/2023  Please call the care guide team at [phone] if you need to cancel or reschedule your appointment.   If you are experiencing a Mental Health or Behavioral Health Crisis or need someone to talk to, please call 911   Patient verbalizes understanding of instructions and care plan provided today and agrees to view in MyChart. Active MyChart status and patient understanding of how to access instructions and care plan via MyChart confirmed with patient.     Telephone follow up appointment with care management team member scheduled for:10/18/2023  Gwyndolyn Saxon MSW, LCSW Licensed Clinical Social Worker  [facility], Population Health Direct Dial: [phone]  Fax: [phone]

## 2023-10-14 ENCOUNTER — Ambulatory Visit: Payer: Self-pay

## 2023-10-14 NOTE — Patient Outreach (Addendum)
 Care Coordination   Follow Up Visit Note   10/14/2023 Name: Meghan Hickman MRN: [id number] DOB: [birth year]  Doralee Albino Pro is a 73 y.o. year old female who sees Etta Grandchild, MD for primary care. I spoke with Carlo [name], friend and Jameah Rouser (Cousin/POA/HCPOA) by phone today.   What matters to the patients health and wellness today? Patient lives alone and has aphasia. Carlo [name] request care team communicate with Sheran Spine (POA/HCPOA/cousin). Velna Hatchet reports that she is listed as HCPOA along with patient's brothers on AD. Per Velna Hatchet they are in process of transitioning patient to an assisted living. Velna Hatchet states friend, Carlo [name] and patient's brother are checking in on patient during this transition time until they are able to get patient into an AL. She states she is also working on getting patient in home care assistance. Office visit completed with neurologist, Dr. Sherryll Burger on 10/12/23-Per review of chart, AL recommended. RNCM advised Velna Hatchet to continue to work with LCSW to assist with transitioning to AL.  Goals Addressed             This Visit's Progress    Care Coordination Activities appropriate support and/or level of care for health management       Interventions Today    Flowsheet Row Most Recent Value  Chronic Disease   Chronic disease during today's visit Other  [aphasia, dementia]  General Interventions   General Interventions Discussed/Reviewed Level of Care, General Interventions Reviewed, [employer] of care needs discussed. provided contact number for [facility]. reiterated in home care agencies provided by LCSW on yesterday.]  Doctor Visits Discussed/Reviewed Doctor Visits Discussed, Specialist, PCP  PCP/Specialist Visits Compliance with follow-up visit  Level of Care Assisted Living  [reiterated in home care contact provided by LCSW on yesterday. RNCM also provided contact number to [facility].]  Education  Interventions   Education Provided Provided Education  [discussed importance of getting assistance into the home to assist patient as needed for example with medications, meals, etc. per Velna Hatchet, She has arranged for meals to be delivered to patient's home daily.]  Advanced Directive Interventions   Advanced Directives Discussed/Reviewed Advanced Care Planning  [discussed ACP documents: advised to send to Marylene Land Lewis-Myers at [email or web address]. Per Velna Hatchet, she is POA,  and patent's brothers and Velna Hatchet are appointmented HCPOA. discussed the importance of getting ACP documents in the system.]             SDOH assessments and interventions completed:  No  Care Coordination Interventions:  Yes, provided   Follow up plan: No further intervention required.   Encounter Outcome:  Patient Visit Completed   Kathyrn Sheriff, RN, MSN, BSN, CCM [place]  [facility], Population Health Case Manager Phone: [phone]

## 2023-10-14 NOTE — Patient Instructions (Signed)
 Visit Information  Thank you for taking time to visit with me today. Please don't hesitate to contact me if I can be of assistance to you.   Following are the goals we discussed today:   [facility]: [phone] or [phone] Continue to work with Licensed Clinical Social Worker for assistance with transitioning to Assisted Living.   Our next appointment is by telephone on 11/01/23 at 10:00 am  Please call the care guide team at [phone] if you need to cancel or reschedule your appointment.   If you are experiencing a Mental Health or Behavioral Health Crisis or need someone to talk to, please call the Suicide and Crisis Lifeline: 988 call the [place] National Suicide Prevention Lifeline: (207)489-9809 or TTY: 867-601-6765 TTY (218) 668-2646) to talk to a trained counselor   Kathyrn Sheriff, RN, MSN, BSN, CCM [place]  [facility], Population Health Case Manager Phone: [phone]

## 2023-10-18 ENCOUNTER — Encounter: Payer: Medicare Other | Admitting: Licensed Clinical Social Worker

## 2023-10-18 ENCOUNTER — Ambulatory Visit: Payer: Self-pay | Admitting: Licensed Clinical Social Worker

## 2023-10-18 NOTE — Patient Instructions (Signed)
 Visit Information  Thank you for taking time to visit with me today. Please don't hesitate to contact me if I can be of assistance to you.   Following are the goals we discussed today:   Goals Addressed             This Visit's Progress    care coordination       Communicate with POA and pt medical team for care and support.  Arrange neurologist visit for pt.  Engage in stress reducing activity to alleviate caregiver stress  Advise pt POA to contact [facility] and [facility] for PCA services.  Reviewed goals 10/18/2023        Our next appointment is by telephone on 11/01/2023 at 1pm  Please call the care guide team at [phone] if you need to cancel or reschedule your appointment.   If you are experiencing a Mental Health or Behavioral Health Crisis or need someone to talk to, please call 911   Patient verbalizes understanding of instructions and care plan provided today and agrees to view in MyChart. Active MyChart status and patient understanding of how to access instructions and care plan via MyChart confirmed with patient.     Telephone follow up appointment with care management team member scheduled for:11/01/2023  Gwyndolyn Saxon MSW, LCSW Licensed Clinical Social Worker  [facility], Population Health Direct Dial: [phone]  Fax: [phone]

## 2023-10-18 NOTE — Patient Outreach (Signed)
 Care Coordination   Follow Up Visit Note   10/18/2023 Name: Meghan Hickman MRN: [id number] DOB: [birth year]  Meghan Hickman is a 73 y.o. year old female who sees Meghan Grandchild, MD for primary care. I spoke with  Meghan Hickman by phone today.  What matters to the patients health and wellness today?  [name] reports [facility] is currently collaborating with family to set up services.     Goals Addressed             This Visit's Progress    care coordination       Communicate with POA and pt medical team for care and support.  Arrange neurologist visit for pt.  Engage in stress reducing activity to alleviate caregiver stress  Advise pt POA to contact [facility] and [facility] for PCA services.  Reviewed goals 10/18/2023        SDOH assessments and interventions completed:  No    Already completed.  Care Coordination Interventions:  Yes, provided  Interventions Today    Flowsheet Row Most Recent Value  General Interventions   Level of Care Personal Care Services  [[name] reports Investment banker, corporate is in contact with family to potentially set up services for pt Meghan Hickman.]       Follow up plan: Follow up call scheduled for 11/01/2023    Encounter Outcome:  Patient Visit Completed   Gwyndolyn Saxon MSW, LCSW Licensed Clinical Social Worker  [facility], Population Health Direct Dial: [phone]  Fax: [phone]

## 2023-10-19 ENCOUNTER — Telehealth: Payer: Self-pay | Admitting: Internal Medicine

## 2023-10-19 NOTE — Telephone Encounter (Signed)
 Verbal order was given to casey.

## 2023-10-19 NOTE — Telephone Encounter (Signed)
 yes

## 2023-10-19 NOTE — Telephone Encounter (Signed)
 Can I call and give them a verbal ?

## 2023-10-19 NOTE — Telephone Encounter (Signed)
 Copied from CRM [id number]. Topic: General - Other >> Oct 19, 2023  9:07 AM Elizebeth Brooking wrote: Reason for CRM: [facility] called in stating that [employer] [facility] stated the patient is asking for speech therapy and possible pt for med management due to dementia would like to get an order to assist her with anything she can.  [phone]

## 2023-10-21 ENCOUNTER — Telehealth: Payer: Self-pay | Admitting: Internal Medicine

## 2023-10-21 NOTE — Telephone Encounter (Signed)
 Can I give the orders ?

## 2023-10-21 NOTE — Telephone Encounter (Signed)
 Yes

## 2023-10-21 NOTE — Telephone Encounter (Signed)
 Copied from CRM [id number]. Topic: Clinical - Medical Advice >> Oct 20, 2023  4:23 PM Fredrich Romans wrote: Reason for CRM: Patient cousin sheila(POA),would like to now how she could go about receiving which medications patient is suppose to be taking right now. They are trying to get her medications in order and updated. EA#[phone]

## 2023-10-21 NOTE — Telephone Encounter (Signed)
 Discussed the patients medication list in great detail with the cousin. She gave a verbal understanding. States that if she has any other questions she will give [place] a call back.

## 2023-10-21 NOTE — Telephone Encounter (Signed)
 Verbal orders given. [facility] about further concerns.

## 2023-10-21 NOTE — Telephone Encounter (Signed)
 Copied from CRM [id number]. Topic: Clinical - Home Health Verbal Orders >> Oct 21, 2023 12:10 PM Kathryne Eriksson wrote: Caller/Agency: [facility] Callback Number: [phone] Service Requested: Speech Therapy Frequency: 1 x week for 4 weeks  Any new concerns about the patient? Yes >> Oct 21, 2023 12:15 PM Kathryne Eriksson wrote: Boneta Lucks states patient told her she isn't taking her ALPRAZolam Prudy Feeler) 1 MG tablet and also states she couldn't find her beclomethasone (QVAR REDIHALER) 80 MCG/ACT inhaler.  Boneta Lucks also is needing to confirm the primary diagnosis. 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

## 2023-10-27 ENCOUNTER — Other Ambulatory Visit: Payer: Self-pay | Admitting: Internal Medicine

## 2023-10-27 DIAGNOSIS — N1831 Chronic kidney disease, stage 3a: Secondary | ICD-10-CM

## 2023-10-27 DIAGNOSIS — E118 Type 2 diabetes mellitus with unspecified complications: Secondary | ICD-10-CM

## 2023-11-01 ENCOUNTER — Ambulatory Visit: Payer: Self-pay

## 2023-11-01 ENCOUNTER — Ambulatory Visit: Payer: Self-pay | Admitting: Licensed Clinical Social Worker

## 2023-11-01 NOTE — Patient Outreach (Unsigned)
 Care Coordination   Follow Up Visit Note   11/01/2023 Name: Meghan Hickman MRN: [id number] DOB: Sep 15, 1950  Meghan Hickman is a 73 y.o. year old female who sees Etta Grandchild, MD for primary care. I spoke with Angelina Pih (cousin/dpr) by phone today.  What matters to the patients health and wellness today?  Per Ms. Angelina Pih, they are in the process of establishing in home care assistance for patient. Velna Hatchet reports patient Brother and his wife are taking a more active role in assisting patient with her needs such as providing meals and assisting with medications. Patient's friend, Carlo, also assist as needed. Velna Hatchet reports patient was assessed by the nurse- per review of chart active with [facility]. Patient also active with [facility] in home care agency for personal care service. She states they have made a down payment and awaiting to get patient into [facility]. She reports her brother/sister in law and patient friends assisting with patient's medication management. Per Velna Hatchet and review of chart, spoke with PCP office and obtained an accurate medication list. Per review of chart, patient's BP 128/78 at neurology office visit on 10/12/26. Velna Hatchet denies any nursing care management needs. She states they are awaiting transition to [facility]. RNCM provided RNCM's contact number and encouraged to call as needed. LCSW remains involved in patient's care.  Goals Addressed             This Visit's Progress    COMPLETED: Care Coordination Activities appropriate support and/or level of care for health management       Interventions Today    Flowsheet Row Most Recent Value  Chronic Disease   Chronic disease during today's visit Other  [dementia/aphasia]  General Interventions   General Interventions Discussed/Reviewed General Interventions Reviewed, Doctor Visits  [Evaluation of current treatment plan for health condition and patient's adherence to plan. RNCM provided  contact number and encouraged to call for care management needs as needed]  Doctor Visits Discussed/Reviewed Doctor Visits Discussed, PCP, Specialist  PCP/Specialist Visits Compliance with follow-up visit  [reviewed upcoming follow up appointments]  Education Interventions   Education Provided Provided Education  Provided Verbal Education On When to see the doctor, Medication  [advised to contact provider with health questions or concerns, eat healthy, take medications as prescribed, attend provider visits as recommended]  Nutrition Interventions   Nutrition Discussed/Reviewed Nutrition Reviewed  [confirmed with cousin, meal plan in place]  Pharmacy Interventions   Pharmacy Dicussed/Reviewed Pharmacy Topics Reviewed  [discussed medication management with neice-confirmed confirmed plan for assisting patient with medication management. per review of chart cousin spoke with PCP office regarding medications patient is to take.]  Safety Interventions   Safety Discussed/Reviewed Safety Reviewed  [confirmed with [facility] in place to assist patient with her needs: personal care and home health [facility]]             SDOH assessments and interventions completed:  No{THN Tip this will not be part of the note when signed-REQUIRED REPORT FIELD DO NOT DELETE (Optional):27901}  Care Coordination Interventions:  Yes, provided {THN Tip this will not be part of the note when signed-REQUIRED REPORT FIELD DO NOT DELETE (Optional):27901}  Follow up plan: No further intervention required.   Encounter Outcome:  Patient Visit Completed {THN Tip this will not be part of the note when signed-REQUIRED REPORT FIELD DO NOT DELETE (Optional):27901}  Kathyrn Sheriff, RN, MSN, BSN, CCM [place]  [facility], Population Health Case Manager Phone: [phone]

## 2023-11-01 NOTE — Patient Outreach (Signed)
 Care Coordination   Follow Up Visit Note   11/01/2023 Name: Meghan Hickman MRN: [id number] DOB: [birth year]  Meghan Hickman is a 73 y.o. year old female who sees Etta Grandchild, MD for primary care. I spoke with  [name] by phone today.  What matters to the patients health and wellness today?  Completed follow up with [name]. [name] reports [facility] is servicing the patient 3 days a weeks and pt brother, sister in law and cousin are more active     Goals Addressed             This Visit's Progress    care coordination       Communicate with POA and pt medical team for care and support.  Arrange neurologist visit for pt.  Engage in stress reducing activity to alleviate caregiver stress  Advise pt POA to contact [facility] and [facility] for PCA services.  Reviewed goals 11/01/2023        SDOH assessments and interventions completed:  No   Completed 10/13/2023  Care Coordination Interventions:  No, not indicated   Follow up plan: No further intervention required.   Encounter Outcome:  Patient Visit Completed   Gwyndolyn Saxon MSW, LCSW Licensed Clinical Social Worker  [facility], Population Health Direct Dial: [phone]  Fax: [phone]

## 2023-11-01 NOTE — Patient Instructions (Signed)
 Visit Information  Thank you for taking time to visit with me today. Please don't hesitate to contact me if I can be of assistance to you.   Following are the goals we discussed today:  Continue to take medications as prescribed. Continue to attend provider visits as scheduled Continue to eat healthy, lean meats, vegetables, fruits, avoid saturated and transfats Contact provider with health questions or concerns as needed   If you are experiencing a Mental Health or Behavioral Health Crisis or need someone to talk to, please call the Suicide and Crisis Lifeline: 988 call the [place] National Suicide Prevention Lifeline: (508)024-5287 or TTY: 303-095-2051 TTY 5314533183) to talk to a trained counselor  Kathyrn Sheriff, RN, MSN, BSN, CCM [facility], Population Health Case Manager Phone: [phone]

## 2023-11-01 NOTE — Patient Instructions (Signed)
 Visit Information  Thank you for taking time to visit with me today. Please don't hesitate to contact me if I can be of assistance to you.   Following are the goals we discussed today:   Goals Addressed             This Visit's Progress    COMPLETED: care coordination       Communicate with POA and pt medical team for care and support.  Arrange neurologist visit for pt.  Engage in stress reducing activity to alleviate caregiver stress  Advise pt POA to contact [facility] and [facility] for PCA services.  Reviewed goals 11/01/2023         Please call the care guide team at [phone] if you need to cancel or reschedule your appointment.   If you are experiencing a Mental Health or Behavioral Health Crisis or need someone to talk to, please call 911   Patient verbalizes understanding of instructions and care plan provided today and agrees to view in MyChart. Active MyChart status and patient understanding of how to access instructions and care plan via MyChart confirmed with patient.     The patient has been provided with contact information for the care management team and has been advised to call with any health related questions or concerns.   Gwyndolyn Saxon MSW, LCSW Licensed Clinical Social Worker  [facility], Population Health Direct Dial: [phone]  Fax: [phone]

## 2023-11-02 ENCOUNTER — Telehealth: Payer: Self-pay | Admitting: Internal Medicine

## 2023-11-02 ENCOUNTER — Other Ambulatory Visit: Payer: Self-pay | Admitting: Internal Medicine

## 2023-11-02 DIAGNOSIS — E118 Type 2 diabetes mellitus with unspecified complications: Secondary | ICD-10-CM

## 2023-11-02 DIAGNOSIS — R000 Tachycardia, unspecified: Secondary | ICD-10-CM

## 2023-11-02 DIAGNOSIS — N1831 Chronic kidney disease, stage 3a: Secondary | ICD-10-CM

## 2023-11-02 DIAGNOSIS — I10 Essential (primary) hypertension: Secondary | ICD-10-CM

## 2023-11-02 NOTE — Telephone Encounter (Signed)
Will you sign this?

## 2023-11-02 NOTE — Telephone Encounter (Signed)
 Copied from CRM [id number]. Topic: Clinical - Medical Advice >> Nov 02, 2023 11:54 AM Josefa Half C wrote: Reason for CRM: Etheleen Sia RN nurse manager for [facility] called to inform the provider the 485 home health order forms must be signed by the face to face physician for insurance purposes; the current home health order was signed by a provider that has not seen the patient; BJ(RN nurse manager) has asked if Judeth Cornfield Matthews(NP) be willing to sign for the 485 home health order forms; Please follow up with BJ Lissa Hoard RN nurse manager [phone] as soon as possible(If she does not answer she has a confidental voicemail box for the provider to leave a detailed answer voice message)

## 2023-11-07 ENCOUNTER — Telehealth: Payer: Self-pay | Admitting: Internal Medicine

## 2023-11-07 NOTE — Telephone Encounter (Signed)
 Copied from CRM [id number]. Topic: General - Other >> Nov 07, 2023 10:58 AM Saverio Danker wrote: Reason for CRM: Velna Hatchet [facility]) is calling because they are trying to get the Patient in a nursing home. The nursing home needs a letter stating the patient is not capable of making decisions for herself. Stating she lacks the capacity to take care of herself mentally and physically  [phone]

## 2023-11-08 NOTE — Telephone Encounter (Signed)
 Patient cousin is calling back regarding the letter for nursing home she would like it emailed to her

## 2023-11-09 ENCOUNTER — Telehealth: Payer: Self-pay | Admitting: Internal Medicine

## 2023-11-09 NOTE — Telephone Encounter (Unsigned)
 Copied from CRM [id number]. Topic: Clinical - Home Health Verbal Orders >> Nov 09, 2023  3:17 PM Bo Mcclintock wrote: Caller/Agency: [facility]  Callback Number: [phone] Service Requested: Speech Therapy Frequency: 1 week 1, (extension) Any new concerns about the patient? No

## 2023-11-10 NOTE — Telephone Encounter (Signed)
 Ok for verbal orders ?

## 2023-11-10 NOTE — Telephone Encounter (Signed)
 Yes

## 2023-11-10 NOTE — Telephone Encounter (Signed)
 Called and gave ok for verbal orders to [facility].

## 2023-11-10 NOTE — Telephone Encounter (Signed)
 Please type this letter up print it and give it to me.

## 2023-11-11 NOTE — Telephone Encounter (Signed)
 Patient had someone come in today to get this letter.  Please call patient and let her know when this is ready for pick up.  She stated that someone had called and said it was ready.

## 2023-11-14 ENCOUNTER — Encounter: Payer: Self-pay | Admitting: Internal Medicine

## 2023-11-17 NOTE — Telephone Encounter (Signed)
 This has been typed, Signed and given to the patients family.

## 2023-11-18 NOTE — Telephone Encounter (Signed)
 Pt states she is returning Jaz's call. Told pt clinic staff was not available at time of call, please reach back out to the pt.

## 2023-11-18 NOTE — Telephone Encounter (Signed)
 Copied from CRM [id number]. Topic: General - Call Back - No Documentation >> Nov 18, 2023 12:27 PM Sim Boast F wrote: Reason for CRM: Patient cousin Velna Hatchet returning [facility] phone call, please call her at [phone] since patient is unable to speak

## 2023-11-18 NOTE — Telephone Encounter (Signed)
 Meghan Hickman stated that Meghan Hickman did sign the 485 form and sent it back to them so everything is taken care of.

## 2023-11-22 NOTE — Telephone Encounter (Signed)
 This has been completed.

## 2023-12-08 ENCOUNTER — Other Ambulatory Visit: Payer: Self-pay | Admitting: Internal Medicine

## 2023-12-08 DIAGNOSIS — G301 Alzheimer's disease with late onset: Secondary | ICD-10-CM

## 2023-12-14 ENCOUNTER — Other Ambulatory Visit: Payer: Self-pay

## 2023-12-14 ENCOUNTER — Encounter (HOSPITAL_BASED_OUTPATIENT_CLINIC_OR_DEPARTMENT_OTHER): Payer: Self-pay | Admitting: Emergency Medicine

## 2023-12-14 ENCOUNTER — Emergency Department (HOSPITAL_BASED_OUTPATIENT_CLINIC_OR_DEPARTMENT_OTHER): Admitting: Radiology

## 2023-12-14 ENCOUNTER — Emergency Department (HOSPITAL_BASED_OUTPATIENT_CLINIC_OR_DEPARTMENT_OTHER)
Admission: EM | Admit: 2023-12-14 | Discharge: 2023-12-14 | Disposition: A | Discharge status: Home / Self Care | Attending: Emergency Medicine | Admitting: Emergency Medicine

## 2023-12-14 ENCOUNTER — Emergency Department (HOSPITAL_BASED_OUTPATIENT_CLINIC_OR_DEPARTMENT_OTHER)

## 2023-12-14 DIAGNOSIS — S9032XA Contusion of left foot, initial encounter: Secondary | ICD-10-CM | POA: Insufficient documentation

## 2023-12-14 DIAGNOSIS — W109XXA Fall (on) (from) unspecified stairs and steps, initial encounter: Secondary | ICD-10-CM | POA: Insufficient documentation

## 2023-12-14 DIAGNOSIS — Z7982 Long term (current) use of aspirin: Secondary | ICD-10-CM | POA: Diagnosis not present

## 2023-12-14 DIAGNOSIS — F0390 Unspecified dementia without behavioral disturbance: Secondary | ICD-10-CM | POA: Diagnosis not present

## 2023-12-14 DIAGNOSIS — S8012XA Contusion of left lower leg, initial encounter: Secondary | ICD-10-CM | POA: Insufficient documentation

## 2023-12-14 DIAGNOSIS — M7989 Other specified soft tissue disorders: Secondary | ICD-10-CM | POA: Diagnosis not present

## 2023-12-14 DIAGNOSIS — E119 Type 2 diabetes mellitus without complications: Secondary | ICD-10-CM | POA: Diagnosis not present

## 2023-12-14 DIAGNOSIS — Z7984 Long term (current) use of oral hypoglycemic drugs: Secondary | ICD-10-CM | POA: Diagnosis not present

## 2023-12-14 DIAGNOSIS — W19XXXA Unspecified fall, initial encounter: Secondary | ICD-10-CM

## 2023-12-14 DIAGNOSIS — M545 Low back pain, unspecified: Secondary | ICD-10-CM | POA: Insufficient documentation

## 2023-12-14 LAB — CBC WITH DIFFERENTIAL/PLATELET
Abs Immature Granulocytes: 0.02 10*3/uL (ref 0.00–0.07)
Basophils Absolute: 0 10*3/uL (ref 0.0–0.1)
Basophils Relative: 0 %
Eosinophils Absolute: 0.3 10*3/uL (ref 0.0–0.5)
Eosinophils Relative: 4 %
HCT: 39 % (ref 36.0–46.0)
Hemoglobin: 12.1 g/dL (ref 12.0–15.0)
Immature Granulocytes: 0 %
Lymphocytes Relative: 27 %
Lymphs Abs: 1.9 10*3/uL (ref 0.7–4.0)
MCH: 26.4 pg (ref 26.0–34.0)
MCHC: 31 g/dL (ref 30.0–36.0)
MCV: 85 fL (ref 80.0–100.0)
Monocytes Absolute: 0.5 10*3/uL (ref 0.1–1.0)
Monocytes Relative: 7 %
Neutro Abs: 4.4 10*3/uL (ref 1.7–7.7)
Neutrophils Relative %: 62 %
Platelets: 406 10*3/uL — ABNORMAL HIGH (ref 150–400)
RBC: 4.59 MIL/uL (ref 3.87–5.11)
RDW: 14.6 % (ref 11.5–15.5)
WBC: 7.1 10*3/uL (ref 4.0–10.5)
nRBC: 0 % (ref 0.0–0.2)

## 2023-12-14 LAB — BASIC METABOLIC PANEL WITH GFR
Anion gap: 9 (ref 5–15)
BUN: 6 mg/dL — ABNORMAL LOW (ref 8–23)
CO2: 26 mmol/L (ref 22–32)
Calcium: 9.3 mg/dL (ref 8.9–10.3)
Chloride: 106 mmol/L (ref 98–111)
Creatinine, Ser: 0.76 mg/dL (ref 0.44–1.00)
GFR, Estimated: >60 mL/min (ref >60)
Glucose, Bld: 95 mg/dL (ref 70–99)
Potassium: 3.5 mmol/L (ref 3.5–5.1)
Sodium: 141 mmol/L (ref 135–145)

## 2023-12-14 LAB — TROPONIN T, HIGH SENSITIVITY: Troponin T High Sensitivity: <15 ng/L (ref <19)

## 2023-12-14 NOTE — Discharge Instructions (Addendum)
 We saw you in the ER after you had a fall. All the imaging results are normal, no fractures seen. No evidence of brain bleed. Please be very careful with walking, and do everything possible to prevent falls. Please use compression stockings to help with the swelling. Please follow up with your primary care provider in the next week to be re-evaluated and ensure you are improving. If symptoms change or worsen, please return to ER.

## 2023-12-14 NOTE — ED Notes (Signed)
Spoke with lab to add on trop 

## 2023-12-14 NOTE — ED Provider Notes (Signed)
 Patient given in sign out by Latricia Poles, PA-C.  Please review their note for patient HPI, physical exam, workup.  At this time the plan is delta troponin and ultrasound and if negative can discharge.  Ultrasound was negative.  After discussion with the attending we agreed patient does not need delta troponin and will discharge with primary care follow-up.  We did recommend that she use compression stockings to help with the swelling along with ice and elevation.  Patient stable to be discharged.  Patient verbalized understanding and acceptance of this plan.  Patient given return precautions.  Staffed with Tamela Fake, MD    Denese Finn, PA-C 12/14/23 1904    Scarlette Currier, MD 12/15/23 1400

## 2023-12-14 NOTE — ED Provider Notes (Signed)
 [place] EMERGENCY DEPARTMENT AT [facility] Provider Note   CSN: [id number] Arrival date & time: 12/14/23  1239     History  Chief Complaint  Patient presents with   Leg Swelling    Meghan Hickman is a 73 y.o. female. 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  Family reports she does not have difficulty understanding and is able to communicate by typing.  She also has history of Alzheimer, diabetes.  She presents today with family at bedside for complaint of the left leg bruising and swelling.  Family noticed last night that her left foot was all bruised, they looked at her leg which was very swollen and bruised as well.  Using her tablet to communicate patient states she fell after tripping on a step outside this past Thursday.  She is also having low back pain and left foot pain but primarily the pain is of the left knee.  Denies head injury, she is not on blood thinners.  HPI     Home Medications Prior to Admission medications   Medication Sig Start Date End Date Taking? Authorizing Provider  almotriptan  (AXERT ) 12.5 MG tablet Take 1 tablet (12.5 mg total) by mouth daily as needed for migraine. 12/08/22   Arcadio Knuckles, MD  ALPRAZolam  (XANAX ) 1 MG tablet Take 1 tablet (1 mg total) by mouth 2 (two) times daily as needed for anxiety. 12/08/22   Arcadio Knuckles, MD  aspirin  81 MG chewable tablet Chew by mouth.    [provider]  atorvastatin  (LIPITOR) 20 MG tablet Take 20 mg by mouth daily.    [provider]  beclomethasone (QVAR  REDIHALER) 80 MCG/ACT inhaler Inhale 1 puff into the lungs 2 (two) times daily. 05/23/20   Ulyess Gammons, NP  blood glucose meter kit and supplies Dispense based on patient and insurance preference. Use up to three times daily as directed. (FOR ICD-10 E11.65). Dispense 100 lancets and test strips and 1 lancing device to match meter. 05/30/19   Stallings, Zoe A, MD   dapagliflozin  propanediol (FARXIGA ) 10 MG TABS tablet Take 1 tablet (10 mg total) by mouth daily before breakfast. Please schedule an appt with PCP 11/07/23   Arcadio Knuckles, MD  DULoxetine  (CYMBALTA ) 60 MG capsule TAKE 1 CAPSULE BY MOUTH EVERY DAY 05/01/23   Arcadio Knuckles, MD  famotidine  (PEPCID ) 40 MG tablet Take 1 tablet (40 mg total) by mouth daily. 05/30/19   Stallings, Zoe A, MD  gabapentin  (NEURONTIN ) 300 MG capsule TAKE 1 CAPSULE BY MOUTH THREE TIMES A DAY 12/08/22   Arcadio Knuckles, MD  ipratropium (ATROVENT ) 0.03 % nasal spray Place 2 sprays into both nostrils 2 (two) times daily. 06/23/16   Buena Carmine, NP  KLOR-CON  M20 20 MEQ tablet TAKE 1 TABLET BY MOUTH TWICE A DAY 08/19/22   Arcadio Knuckles, MD  levocetirizine (XYZAL ) 5 MG tablet TAKE 1 TABLET BY MOUTH EVERY DAY IN THE EVENING 10/04/23   Arcadio Knuckles, MD  meloxicam  (MOBIC ) 15 MG tablet TAKE 1 TABLET BY MOUTH EVERY DAY WITH FOOD 08/01/23   Colene Dauphin, MD  memantine  (NAMENDA ) 10 MG tablet TAKE 1 TABLET BY MOUTH TWICE A DAY 09/12/23   Arcadio Knuckles, MD  metoprolol  succinate (TOPROL -XL) 25 MG 24 hr tablet Take 1 tablet (25 mg total) by mouth daily. Please schedule an appt with PCP 11/07/23   Arcadio Knuckles, MD  Multiple Vitamins-Minerals (  MULTIVITAMIN & MINERAL PO) Take 1 tablet by mouth daily.    [provider]  ONETOUCH ULTRA test strip USE UP TO 3 TIMES A DAY AS DIRECTED 06/24/19   Stallings, Zoe A, MD  rivastigmine (EXELON) 4.5 MG capsule Take 4.5 mg by mouth 2 (two) times daily. 07/12/23 07/11/24  [provider]  traMADol  (ULTRAM ) 50 MG tablet TAKE 1 TABLET BY MOUTH EVERY 6 HOURS AS NEEDED. 05/05/22   Arcadio Knuckles, MD  traZODone  (DESYREL ) 50 MG tablet TAKE 1/2 TO 1 TABLET BY MOUTH AT BEDTIME AS NEEDED FOR SLEEP 06/26/20   Ulyess Gammons, NP      Allergies    Patient has no known allergies.    Review of Systems   Review of Systems  Physical Exam Updated Vital Signs BP (!) 142/52 (BP Location:  Right Arm)   Pulse 71   Temp 97.8 F (36.6 C)   Resp 16   SpO2 96%  Physical Exam Vitals and nursing note reviewed.  Constitutional:      General: She is not in acute distress.    Appearance: She is well-developed.  HENT:     Head: Normocephalic and atraumatic.     Mouth/Throat:     Mouth: Mucous membranes are moist.  Eyes:     Extraocular Movements: Extraocular movements intact.     Conjunctiva/sclera: Conjunctivae normal.     Pupils: Pupils are equal, round, and reactive to light.  Cardiovascular:     Rate and Rhythm: Normal rate and regular rhythm.     Heart sounds: No murmur heard. Pulmonary:     Effort: Pulmonary effort is normal. No respiratory distress.     Breath sounds: Normal breath sounds.  Abdominal:     Palpations: Abdomen is soft.     Tenderness: There is no abdominal tenderness.  Musculoskeletal:        General: No swelling.     Cervical back: Neck supple.  Skin:    General: Skin is warm and dry.     Capillary Refill: Capillary refill takes less than 2 seconds.  Neurological:     General: No focal deficit present.     Mental Status: She is alert and oriented to person, place, and time.  Psychiatric:        Mood and Affect: Mood normal.     ED Results / Procedures / Treatments   Labs (all labs ordered are listed, but only abnormal results are displayed) Labs Reviewed  CBC WITH DIFFERENTIAL/PLATELET  BASIC METABOLIC PANEL WITH GFR    EKG None  Radiology No results found.  Procedures Procedures    Medications Ordered in ED Medications - No data to display  ED Course/ Medical Decision Making/ A&P                                 Medical Decision Making This patient presents to the ED for concern of left leg swelling, fall, this involves an extensive number of treatment options, and is a complaint that carries with it a high risk of complications and morbidity.  The differential diagnosis includes sprain, strain, contusion, fracture,  dislocation, DVT, mechanical fall, syncope, other   Co morbidities that complicate the patient evaluation :   Aphasia, dementia, diabetes   Additional history obtained:  Additional history obtained from EMR External records from outside source obtained and reviewed including prior and labs   Lab Tests:  I Ordered, and  personally interpreted labs.  The pertinent results include:    CBC-normal except for mild thrombocytosis Troponin-initial troponin negative BMP normal  Imaging Studies ordered:  I ordered imaging studies including x-ray left knee tibia-fibula and left foot which shows soft tissue swelling, degenerative changes, no fracture or dislocations; x-ray lumbar spine shows degenerative changes no fracture or traumatic malalignment CT head shows possible 9 mm soft tissue-density, not able to palpate this on exam, no pain ET cervical spine without acute findings I independently visualized and interpreted imaging within scope of identifying emergent findings  I agree with the radiologist interpretation   Cardiac Monitoring: / EKG:  The patient was maintained on a cardiac monitor.  I personally viewed and interpreted the cardiac monitored which showed an underlying rhythm of:      Problem List / ED Course / Critical interventions / Medication management   Left leg swelling-there is a lot of bruising, patient reports fall, initially to me he stated it was last Thursday and then after reevaluation by ED attending reported a fall today as well.  The bruising was there last night so at least some of this was pre-existing to today.  Will get x-rays but also rule out DVT to the degree of swelling.  She has good pulses in the left foot and is able to range the joints.  Given limited history due to patient's aphasia and dementia broadening the workup and will also CT her head and C-spine due to questionable fall today as well and obtain labs to rule out any potential cause of fall  or electrolyte disturbance.  Reassuring, waiting on second troponin and ultrasound of her leg, signed out to PA [employer].  Patient and family if no DVT, can do elevation, compression stocking at home and follow-up with orthopedics and PCP.  I have reviewed the patients home medicines and have made adjustments as needed      Amount and/or Complexity of Data Reviewed Labs: ordered. Radiology: ordered.           Final Clinical Impression(s) / ED Diagnoses Final diagnoses:  None    Rx / DC Orders ED Discharge Orders     None         Joshua Nieves 12/14/23 1844    Trish Furl, MD 12/19/23 [phone]

## 2023-12-14 NOTE — ED Notes (Signed)
 Patient transported to X-ray

## 2023-12-14 NOTE — ED Triage Notes (Signed)
 Pt family providing triage information, c/o LT foot swelling and pain. Endorses concern for a recent fall. Swelling noted. Pt has aphasia at baseline

## 2023-12-15 ENCOUNTER — Telehealth: Payer: Self-pay

## 2023-12-15 NOTE — Transitions of Care (Post Inpatient/ED Visit) (Signed)
 12/15/2023  Name: Meghan Hickman MRN: [id number] DOB: 01/22/1951  Today's TOC FU Call Status: Today's TOC FU Call Status:: Successful TOC FU Call Completed TOC FU Call Complete Date: 12/15/23 Patient's Name and Date of Birth confirmed.  Transition Care Management Follow-up Telephone Call Date of Discharge: 12/14/23 Discharge Facility: [facility] ([facility]) Type of Discharge: Emergency Department Reason for ED Visit: Other: (fall) How have you been since you were released from the hospital?: Better Any questions or concerns?: No  Items Reviewed: Did you receive and understand the discharge instructions provided?: Yes Medications obtained,verified, and reconciled?: Yes (Medications Reviewed) Any new allergies since your discharge?: No Dietary orders reviewed?: Yes Do you have support at home?: Yes Name of Support/Comfort Primary Source: caregiver  Medications Reviewed Today: Medications Reviewed Today     Reviewed by Darrall Ellison, LPN (Licensed Practical Nurse) on 12/15/23 at 1451  Med List Status: <None>   Medication Order Taking? Sig Documenting Provider Last Dose Status Informant  almotriptan  (AXERT ) 12.5 MG tablet [id number] No Take 1 tablet (12.5 mg total) by mouth daily as needed for migraine. Arcadio Knuckles, MD Taking Active   ALPRAZolam  (XANAX ) 1 MG tablet [id number] No Take 1 tablet (1 mg total) by mouth 2 (two) times daily as needed for anxiety. Arcadio Knuckles, MD Taking Active   aspirin  81 MG chewable tablet [id number] No Chew by mouth. [provider] Taking Active   atorvastatin  (LIPITOR) 20 MG tablet [id number] No Take 20 mg by mouth daily. [provider] Taking Active   beclomethasone (QVAR  REDIHALER) 80 MCG/ACT inhaler [id number] No Inhale 1 puff into the lungs 2 (two) times daily. Ulyess Gammons, NP Taking Active   blood glucose meter kit and supplies [id number] No Dispense based on patient and insurance preference. Use up to three times  daily as directed. (FOR ICD-10 E11.65). Dispense 100 lancets and test strips and 1 lancing device to match meter. Stallings, Zoe A, MD Taking Active   dapagliflozin  propanediol (FARXIGA ) 10 MG TABS tablet [id number]  Take 1 tablet (10 mg total) by mouth daily before breakfast. Please schedule an appt with PCP Arcadio Knuckles, MD  Active   DULoxetine  (CYMBALTA ) 60 MG capsule [id number] No TAKE 1 CAPSULE BY MOUTH EVERY DAY Arcadio Knuckles, MD Taking Active   famotidine  (PEPCID ) 40 MG tablet [id number] No Take 1 tablet (40 mg total) by mouth daily. Stallings, Zoe A, MD Taking Active   gabapentin  (NEURONTIN ) 300 MG capsule [id number] No TAKE 1 CAPSULE BY MOUTH THREE TIMES A DAY Arcadio Knuckles, MD Taking Active   ipratropium (ATROVENT ) 0.03 % nasal spray [id number] No Place 2 sprays into both nostrils 2 (two) times daily. Buena Carmine, NP Taking Active            Med Note Leoma Raja Dec 11, 2018 10:15 AM) prn  KLOR-CON  M20 20 MEQ tablet [id number] No TAKE 1 TABLET BY MOUTH TWICE A DAY Arcadio Knuckles, MD Taking Active   levocetirizine (XYZAL ) 5 MG tablet [id number]  TAKE 1 TABLET BY MOUTH EVERY DAY IN THE Annella Kief, MD  Active   meloxicam  (MOBIC ) 15 MG tablet [id number] No TAKE 1 TABLET BY MOUTH EVERY DAY WITH FOOD Colene Dauphin, MD Taking Active   memantine  (NAMENDA ) 10 MG tablet [id number] No TAKE 1 TABLET BY MOUTH TWICE A DAY Arcadio Knuckles, MD Taking Active   metoprolol  succinate (TOPROL -XL) 25 MG 24 hr tablet [id number]  Take 1 tablet (25  mg total) by mouth daily. Please schedule an appt with PCP Arcadio Knuckles, MD  Active   Multiple Vitamins-Minerals (MULTIVITAMIN & MINERAL PO) [id number] No Take 1 tablet by mouth daily. [provider] Taking Active Self           Med Note Leoma Raja Dec 11, 2018 10:16 AM) prn  ONETOUCH ULTRA test strip [id number] No USE UP TO 3 TIMES A DAY AS DIRECTED Tereasa Felty, Zoe A, MD Taking Active   rivastigmine (EXELON) 4.5 MG  capsule [id number] No Take 4.5 mg by mouth 2 (two) times daily. [provider] Taking Active   traMADol  (ULTRAM ) 50 MG tablet [id number] No TAKE 1 TABLET BY MOUTH EVERY 6 HOURS AS NEEDED. Arcadio Knuckles, MD Taking Active   traZODone  (DESYREL ) 50 MG tablet [id number] No TAKE 1/2 TO 1 TABLET BY MOUTH AT BEDTIME AS NEEDED FOR SLEEP Ulyess Gammons, NP Taking Active             Home Care and Equipment/Supplies: Were Home Health Services Ordered?: NA Any new equipment or medical supplies ordered?: NA  Functional Questionnaire: Do you need assistance with bathing/showering or dressing?: No Do you need assistance with meal preparation?: Yes Do you need assistance with eating?: No Do you have difficulty maintaining continence: No Do you need assistance with getting out of bed/getting out of a chair/moving?: No Do you have difficulty managing or taking your medications?: No  Follow up appointments reviewed: PCP Follow-up appointment confirmed?: Yes MD Provider Line Number:[phone] Given: No Date of PCP follow-up appointment?: 12/21/23 Follow-up Provider: [facility] Follow-up appointment confirmed?: NA Do you need transportation to your follow-up appointment?: No Do you understand care options if your condition(s) worsen?: Yes-patient verbalized understanding    SIGNATURE Darrall Ellison, LPN [facility] Nurse Health Advisor Direct Dial [phone]

## 2023-12-20 ENCOUNTER — Other Ambulatory Visit: Payer: Self-pay

## 2023-12-20 ENCOUNTER — Encounter: Payer: Self-pay | Admitting: Internal Medicine

## 2023-12-20 NOTE — Progress Notes (Unsigned)
 Subjective:    Patient ID: Meghan Hickman, female    DOB: 04-27-1951, 73 y.o.   MRN: [id number]     HPI Meghan Hickman is here for follow up from the ED  She is here with a family member who supplies the history secondary to the patient's dementia and aphasia.  Ed 5/7 - family noticed left leg bruising and swelling, left foot bruising.  She told them she tripped and fell outside one week ago.  Denied head injuries.    She had low back pain, left foot pain and left knee pain.  X-rays of left knee, tib-fib-7, left foot showed soft tissue swelling, arthritis, but no fractures or dislocations.  X-ray lumbar spine showed arthritic changes without fracture.  CT head showed possible 9 mm soft tissue density that was not palpable-no pain noted.  CT cervical spine without acute findings.  Troponin x 2 negative.  Ultrasound left leg negative for DVT.  BMP, CBC unremarkable.  Patient discharged home  Left leg swelling/ knee swelling-she is still having significant left leg and knee swelling.  Is difficult to know how much pain she is in.  Family is working on getting her into [facility].     Medications and allergies reviewed with patient and updated if appropriate.  Current Outpatient Medications on File Prior to Visit  Medication Sig Dispense Refill   almotriptan  (AXERT ) 12.5 MG tablet Take 1 tablet (12.5 mg total) by mouth daily as needed for migraine. 10 tablet 4   ALPRAZolam  (XANAX ) 1 MG tablet Take 1 tablet (1 mg total) by mouth 2 (two) times daily as needed for anxiety. 60 tablet 2   aspirin  81 MG chewable tablet Chew by mouth.     atorvastatin  (LIPITOR) 20 MG tablet Take 20 mg by mouth daily.     beclomethasone (QVAR  REDIHALER) 80 MCG/ACT inhaler Inhale 1 puff into the lungs 2 (two) times daily. 1 each 11   blood glucose meter kit and supplies Dispense based on patient and insurance preference. Use up to three times daily as directed. (FOR ICD-10 E11.65). Dispense 100 lancets and  test strips and 1 lancing device to match meter. 1 each 0   dapagliflozin  propanediol (FARXIGA ) 10 MG TABS tablet Take 1 tablet (10 mg total) by mouth daily before breakfast. Please schedule an appt with PCP 30 tablet 0   DULoxetine  (CYMBALTA ) 60 MG capsule TAKE 1 CAPSULE BY MOUTH EVERY DAY 90 capsule 1   famotidine  (PEPCID ) 40 MG tablet Take 1 tablet (40 mg total) by mouth daily. 90 tablet 3   gabapentin  (NEURONTIN ) 300 MG capsule TAKE 1 CAPSULE BY MOUTH THREE TIMES A DAY 270 capsule 0   ipratropium (ATROVENT ) 0.03 % nasal spray Place 2 sprays into both nostrils 2 (two) times daily. 30 mL 0   KLOR-CON  M20 20 MEQ tablet TAKE 1 TABLET BY MOUTH TWICE A DAY 180 tablet 0   levocetirizine (XYZAL ) 5 MG tablet TAKE 1 TABLET BY MOUTH EVERY DAY IN THE EVENING 90 tablet 0   meloxicam  (MOBIC ) 15 MG tablet TAKE 1 TABLET BY MOUTH EVERY DAY WITH FOOD 30 tablet 0   memantine  (NAMENDA ) 10 MG tablet TAKE 1 TABLET BY MOUTH TWICE A DAY 180 tablet 0   metoprolol  succinate (TOPROL -XL) 25 MG 24 hr tablet Take 1 tablet (25 mg total) by mouth daily. Please schedule an appt with PCP 30 tablet 0   Multiple Vitamins-Minerals (MULTIVITAMIN & MINERAL PO) Take 1 tablet by mouth daily.  ONETOUCH ULTRA test strip USE UP TO 3 TIMES A DAY AS DIRECTED 100 strip 2   rivastigmine (EXELON) 4.5 MG capsule Take 4.5 mg by mouth 2 (two) times daily.     traMADol  (ULTRAM ) 50 MG tablet TAKE 1 TABLET BY MOUTH EVERY 6 HOURS AS NEEDED. 65 tablet 3   traZODone  (DESYREL ) 50 MG tablet TAKE 1/2 TO 1 TABLET BY MOUTH AT BEDTIME AS NEEDED FOR SLEEP 90 tablet 2   No current facility-administered medications on file prior to visit.     Review of Systems  Unable to perform ROS: Other  Pt has aphasia and dementia     Objective:   Vitals:   12/21/23 0958  BP: 136/76  Pulse: 100  Temp: 98.7 F (37.1 C)  SpO2: 97%   BP Readings from Last 3 Encounters:  12/21/23 136/76  12/14/23 (!) 144/76  09/27/23 (!) 140/78   Wt Readings from Last  3 Encounters:  12/21/23 137 lb (62.1 kg)  09/27/23 137 lb 9.6 oz (62.4 kg)  08/09/23 146 lb 12.8 oz (66.6 kg)   Body mass index is 27.67 kg/m.    Physical Exam Constitutional:      General: She is not in acute distress.    Appearance: Normal appearance. She is not ill-appearing.  HENT:     Head: Normocephalic and atraumatic.  Musculoskeletal:     Right lower leg: Edema (Trace) present.     Left lower leg: Edema (2+ edema-nonpitting-lower leg is tight from the swelling) present.     Comments: Right knee with large effusion.  Knee is warm to touch There is some overlying bruising that looks like she may have landed on the knee.  She is able to ambulate without cane or walker  Skin:    General: Skin is warm and dry.  Neurological:     Mental Status: She is alert.        Lab Results  Component Value Date   WBC 7.1 12/14/2023   HGB 12.1 12/14/2023   HCT 39.0 12/14/2023   PLT 406 (H) 12/14/2023   GLUCOSE 95 12/14/2023   CHOL 193 08/09/2023   TRIG 79.0 08/09/2023   HDL 49.80 08/09/2023   LDLCALC 127 (H) 08/09/2023   ALT 117 (H) 08/09/2023   AST 133 (H) 08/09/2023   NA 141 12/14/2023   K 3.5 12/14/2023   CL 106 12/14/2023   CREATININE 0.76 12/14/2023   BUN 6 (L) 12/14/2023   CO2 26 12/14/2023   TSH 1.09 04/20/2023   INR 1.2 (H) 08/09/2023   HGBA1C 6.2 08/09/2023   MICROALBUR 3.1 (H) 08/09/2023   US  Venous Img Lower  Left (DVT Study) CLINICAL DATA:  Left foot edema and pain.  EXAM: Left LOWER EXTREMITY VENOUS DOPPLER ULTRASOUND  TECHNIQUE: Gray-scale sonography with compression, as well as color and duplex ultrasound, were performed to evaluate the deep venous system(s) from the level of the common femoral vein through the popliteal and proximal calf veins.  COMPARISON:  None Available.  FINDINGS: VENOUS  Normal compressibility of the common femoral, superficial femoral, and popliteal veins, as well as the visualized calf veins. Visualized portions of  profunda femoral vein and great saphenous vein unremarkable. No filling defects to suggest DVT on grayscale or color Doppler imaging. Doppler waveforms show normal direction of venous flow, normal respiratory plasticity and response to augmentation.  Limited views of the contralateral common femoral vein are unremarkable.  OTHER  None.  Limitations: none  IMPRESSION: Negative.  Electronically Signed  By: Rosalene Colon M.D.   On: 12/14/2023 18:42 CT Head Wo Contrast CLINICAL DATA:  Head trauma.  EXAM: CT HEAD WITHOUT CONTRAST  TECHNIQUE: Contiguous axial images were obtained from the base of the skull through the vertex without intravenous contrast.  RADIATION DOSE REDUCTION: This exam was performed according to the departmental dose-optimization program which includes automated exposure control, adjustment of the mA and/or kV according to patient size and/or use of iterative reconstruction technique.  COMPARISON:  CT head 06/02/2015.  FINDINGS: Brain: No evidence of acute infarction, hemorrhage, hydrocephalus, extra-axial collection or mass lesion/mass effect. There is new mild diffuse atrophy and new mild periventricular white matter hypodensity.  Vascular: No hyperdense vessel or unexpected calcification.  Skull: Normal. Negative for fracture or focal lesion.  Sinuses/Orbits: No acute finding.  Other: There is a rounded hyperdense area in the high left parietal scalp soft tissues measuring 9 mm which is new from prior.  IMPRESSION: 1. No acute intracranial process. 2. New mild diffuse atrophy and mild periventricular white matter hypodensity, likely small vessel ischemic disease. 3. Rounded hyperdense area in the high left parietal scalp soft tissues measuring 9 mm is new from prior. This may represent a small hematoma, sebaceous cyst or other lesion. Please correlate clinically.  Electronically Signed   By: Tyron Gallon M.D.   On: 12/14/2023  18:21 CT Cervical Spine Wo Contrast CLINICAL DATA:  Neck trauma (Age >= 65y).  Fall.  EXAM: CT CERVICAL SPINE WITHOUT CONTRAST  TECHNIQUE: Multidetector CT imaging of the cervical spine was performed without intravenous contrast. Multiplanar CT image reconstructions were also generated.  RADIATION DOSE REDUCTION: This exam was performed according to the departmental dose-optimization program which includes automated exposure control, adjustment of the mA and/or kV according to patient size and/or use of iterative reconstruction technique.  COMPARISON:  Plain films 09/30/2015  FINDINGS: Alignment: No subluxation  Skull base and vertebrae: No acute fracture. No primary bone lesion or focal pathologic process.  Soft tissues and spinal canal: No prevertebral fluid or swelling. No visible canal hematoma.  Disc levels: Diffuse degenerative disc disease with disc space narrowing and spurring. Mild degenerative facet disease bilaterally, most pronounced on the right.  Upper chest: No acute findings  Other: None  IMPRESSION: Degenerative disc and facet disease.  No acute bony abnormality.  Electronically Signed   By: Janeece Mechanic M.D.   On: 12/14/2023 18:20 DG Hip Unilat W or Wo Pelvis 2-3 Views Left CLINICAL DATA:  Fall and left hip pain.  EXAM: DG HIP (WITH OR WITHOUT PELVIS) 2-3V LEFT  COMPARISON:  None Available.  FINDINGS: No acute fracture or dislocation. The bones are osteopenic. Mild arthritic changes of the hips. The soft tissues are unremarkable  IMPRESSION: 1. No acute fracture or dislocation. 2. Osteopenia.  Electronically Signed   By: Angus Bark M.D.   On: 12/14/2023 18:18 DG Lumbar Spine Complete CLINICAL DATA:  fall, pain, swelling  EXAM: LUMBAR SPINE - COMPLETE 4+ VIEW  COMPARISON:  December 03, 2017  FINDINGS: Diffuse osteopenia. Five non rib-bearing lumbar type vertebral bodies. Moderate dextrocurvature of the lumbar spine  without spondylolisthesis. Vertebral body heights are well maintained without acute fracture. Diffuse intervertebral disc height loss throughout the lumbar spine, most severe at L2-L3 and L5-S1 with large osteophyte formation throughout multilevel facet arthropathy. Cholecystectomy clips. Mild joint space loss of both hips.  IMPRESSION: 1. No acute fracture or malalignment of the lumbar spine. 2. Multilevel degenerative disc disease of the lumbar spine, most severe  at L2-L3 and L5-S1.  Electronically Signed   By: Rance Burrows M.D.   On: 12/14/2023 17:08 DG Foot Complete Left CLINICAL DATA:  Left foot pain.  EXAM: LEFT TIBIA AND FIBULA - 2 VIEW; LEFT KNEE - COMPLETE 4+ VIEW; LEFT FOOT - COMPLETE 3+ VIEW  COMPARISON:  Left foot radiographs dated 05/30/2012.  FINDINGS: No acute fracture or dislocation. Severe patellofemoral joint space narrowing resulting in bone-on-bone contact with the femoral trochlea, marginal osteophytosis, and subchondral sclerosis. Mild medial and lateral femorotibial compartment joint space narrowing with lateral compartment osteophytosis. Small knee joint effusion.  Hallux valgus deformity with moderate degenerative changes of the first MTP joint. Moderate degenerative changes of the first through fifth TMT joints.  Diffuse nonspecific subcutaneous edema of the visualized left lower extremity.  IMPRESSION: 1. No acute osseous abnormality. 2. Severe degenerative changes of the patellofemoral joint with small knee joint effusion. 3. Hallux valgus deformity with moderate degenerative changes of the first MTP and TMT joints. 4. Diffuse nonspecific subcutaneous edema of the visualized left lower extremity.  Electronically Signed   By: Mannie Seek M.D.   On: 12/14/2023 17:06 DG Tibia/Fibula Left CLINICAL DATA:  Left foot pain.  EXAM: LEFT TIBIA AND FIBULA - 2 VIEW; LEFT KNEE - COMPLETE 4+ VIEW; LEFT FOOT - COMPLETE 3+ VIEW  COMPARISON:   Left foot radiographs dated 05/30/2012.  FINDINGS: No acute fracture or dislocation. Severe patellofemoral joint space narrowing resulting in bone-on-bone contact with the femoral trochlea, marginal osteophytosis, and subchondral sclerosis. Mild medial and lateral femorotibial compartment joint space narrowing with lateral compartment osteophytosis. Small knee joint effusion.  Hallux valgus deformity with moderate degenerative changes of the first MTP joint. Moderate degenerative changes of the first through fifth TMT joints.  Diffuse nonspecific subcutaneous edema of the visualized left lower extremity.  IMPRESSION: 1. No acute osseous abnormality. 2. Severe degenerative changes of the patellofemoral joint with small knee joint effusion. 3. Hallux valgus deformity with moderate degenerative changes of the first MTP and TMT joints. 4. Diffuse nonspecific subcutaneous edema of the visualized left lower extremity.  Electronically Signed   By: Mannie Seek M.D.   On: 12/14/2023 17:06 DG Knee Complete 4 Views Left CLINICAL DATA:  Left foot pain.  EXAM: LEFT TIBIA AND FIBULA - 2 VIEW; LEFT KNEE - COMPLETE 4+ VIEW; LEFT FOOT - COMPLETE 3+ VIEW  COMPARISON:  Left foot radiographs dated 05/30/2012.  FINDINGS: No acute fracture or dislocation. Severe patellofemoral joint space narrowing resulting in bone-on-bone contact with the femoral trochlea, marginal osteophytosis, and subchondral sclerosis. Mild medial and lateral femorotibial compartment joint space narrowing with lateral compartment osteophytosis. Small knee joint effusion.  Hallux valgus deformity with moderate degenerative changes of the first MTP joint. Moderate degenerative changes of the first through fifth TMT joints.  Diffuse nonspecific subcutaneous edema of the visualized left lower extremity.  IMPRESSION: 1. No acute osseous abnormality. 2. Severe degenerative changes of the patellofemoral joint  with small knee joint effusion. 3. Hallux valgus deformity with moderate degenerative changes of the first MTP and TMT joints. 4. Diffuse nonspecific subcutaneous edema of the visualized left lower extremity.  Electronically Signed   By: Mannie Seek M.D.   On: 12/14/2023 17:06    Assessment & Plan:    Left leg swelling, left knee swelling/effusion- Subacute History is limited because the patient is aphasic and has dementia Left knee is swollen-large effusion and she has swelling in her left lower leg Advised elevating the leg whenever she is sitting Can  apply heat or ice to the knee which may help with some of the swelling Discussed referral to sports medicine for further treatment of the effusion which would likely be draining with the needle versus oral prednisone  for a few days to see if that helps She would like to try the oral prednisone  first-prednisone  20 mg daily x 5 days-advised to take with food If there is any side effects family will call If there is still fluid remaining should see sports medicine because may need a fusion removed by aspiration

## 2023-12-21 ENCOUNTER — Ambulatory Visit: Admitting: Internal Medicine

## 2023-12-21 VITALS — BP 136/76 | HR 100 | Temp 98.7°F | Ht 59.0 in | Wt 137.0 lb

## 2023-12-21 DIAGNOSIS — M7989 Other specified soft tissue disorders: Secondary | ICD-10-CM | POA: Diagnosis not present

## 2023-12-21 DIAGNOSIS — M25462 Effusion, left knee: Secondary | ICD-10-CM

## 2023-12-21 DIAGNOSIS — W19XXXD Unspecified fall, subsequent encounter: Secondary | ICD-10-CM

## 2023-12-21 DIAGNOSIS — M79605 Pain in left leg: Secondary | ICD-10-CM

## 2023-12-21 MED ORDER — PREDNISONE 20 MG PO TABS: 20.0000 mg | ORAL_TABLET | Freq: Every day | ORAL | 0 refills | Status: AC

## 2023-12-21 NOTE — Patient Instructions (Addendum)
       Medications changes include :   prednisone  20 mg daily for 5 days - take with food    Elevate left leg whenever you are sitting.    Apply heat or ice to the knee to help decrease the swelling   If swelling is not improving you may need to see a sports medicine doctor to have the fluid drained.     Return if symptoms worsen or fail to improve.

## 2023-12-27 ENCOUNTER — Telehealth: Payer: Self-pay | Admitting: Internal Medicine

## 2023-12-27 DIAGNOSIS — M25462 Effusion, left knee: Secondary | ICD-10-CM

## 2023-12-27 NOTE — Telephone Encounter (Signed)
 If she is still having swelling in the knee I would recommend she see sports medicine to have the fluid drained out.  I ordered a referral.  They can also call to make an appointment and just let them know the referral was ordered- [facility] - [phone]

## 2023-12-27 NOTE — Telephone Encounter (Signed)
 Meghan Hickman states that the swelling has gone done but it still feels like it's some fluid around the patients knee. She is wondering what they should do next.

## 2023-12-27 NOTE — Telephone Encounter (Signed)
 Patient and patients friend has been made aware of Dr. Donnette Gal comments and gave a verbal understanding.

## 2023-12-27 NOTE — Telephone Encounter (Signed)
 Copied from CRM [id number]. Topic: General - Other >> Dec 27, 2023  8:47 AM Howard Macho wrote: Reason for CRM: patient friend called stating the patient was seen five days ago for her knee and she was prescribed medication and she took her last pill. Carlo (friend) stated the swelling of the patient knee has gone and  she want to know if the patient need to come back in CB [phone]

## 2024-01-03 ENCOUNTER — Other Ambulatory Visit: Payer: Self-pay | Admitting: Internal Medicine

## 2024-01-17 ENCOUNTER — Ambulatory Visit: Admitting: Family Medicine

## 2024-01-20 ENCOUNTER — Telehealth: Payer: Self-pay

## 2024-01-20 NOTE — Telephone Encounter (Unsigned)
 Copied from CRM [id number]. Topic: General - Other >> Jan 20, 2024  2:46 PM Allyne Areola wrote: Reason for CRM: Patient's cousin Hilaria, [place]. Is calling to advise that the patient has been moved to a living facility [facility] [address] [place]. They will be faxing over some documentation regarding Speech therapy with [facility].

## 2024-01-21 ENCOUNTER — Other Ambulatory Visit: Payer: Self-pay | Admitting: Internal Medicine

## 2024-01-21 DIAGNOSIS — I10 Essential (primary) hypertension: Secondary | ICD-10-CM

## 2024-01-21 DIAGNOSIS — R000 Tachycardia, unspecified: Secondary | ICD-10-CM

## 2024-01-25 ENCOUNTER — Ambulatory Visit

## 2024-02-06 ENCOUNTER — Telehealth: Payer: Self-pay

## 2024-02-06 NOTE — Telephone Encounter (Unsigned)
 Copied from CRM [id number]. Topic: Clinical - Medication Question >> Feb 06, 2024  3:18 PM Chiquita SQUIBB wrote: Reason for CRM: Patients cousin POA is calling in to get an updated medication list of what she needs to be taking. She is asking if this can be emailed to her at [email or web address]

## 2024-02-09 NOTE — Telephone Encounter (Signed)
 Meghan Hickman has been made aware that the medication list has been sent to her email. She gave a verbal understanding. She also wanted to discuss with the provider if he could take [name] off some of her mediations because she's on a lot of medication. I advised her that she could schedule an appointment for [name] but its not a guarantee that she will be taken off any of her medications sheila gave a verbal understanding.,

## 2024-02-24 ENCOUNTER — Ambulatory Visit

## 2024-02-28 ENCOUNTER — Other Ambulatory Visit (HOSPITAL_COMMUNITY): Payer: Self-pay

## 2024-02-29 ENCOUNTER — Other Ambulatory Visit: Payer: Self-pay | Admitting: Internal Medicine

## 2024-02-29 DIAGNOSIS — F331 Major depressive disorder, recurrent, moderate: Secondary | ICD-10-CM

## 2024-03-02 ENCOUNTER — Other Ambulatory Visit: Payer: Self-pay

## 2024-03-05 ENCOUNTER — Other Ambulatory Visit (HOSPITAL_COMMUNITY): Payer: Self-pay

## 2024-03-05 ENCOUNTER — Other Ambulatory Visit: Payer: Self-pay

## 2024-03-06 ENCOUNTER — Other Ambulatory Visit (HOSPITAL_COMMUNITY): Payer: Self-pay

## 2024-03-06 ENCOUNTER — Other Ambulatory Visit: Payer: Self-pay

## 2024-03-06 MED ORDER — ATORVASTATIN CALCIUM 20 MG PO TABS
20.0000 mg | ORAL_TABLET | Freq: Every day | ORAL | 11 refills | Status: AC
  Filled 2024-03-06 (×2): qty 30, 30d supply, fill #0
  Filled 2024-03-28: qty 30, 30d supply, fill #1
  Filled 2024-04-26: qty 30, 30d supply, fill #2
  Filled 2024-05-23: qty 30, 30d supply, fill #3
  Filled 2024-06-22: qty 30, 30d supply, fill #4
  Filled 2024-07-24 – 2024-07-30 (×2): qty 30, 30d supply, fill #5
  Filled 2024-08-27: qty 30, 30d supply, fill #6

## 2024-03-06 MED ORDER — MEMANTINE HCL 10 MG PO TABS
10.0000 mg | ORAL_TABLET | Freq: Two times a day (BID) | ORAL | 11 refills | Status: AC
  Filled 2024-03-06 (×2): qty 60, 30d supply, fill #0
  Filled 2024-03-28: qty 60, 30d supply, fill #1
  Filled 2024-04-26: qty 60, 30d supply, fill #2
  Filled 2024-05-23 – 2024-05-24 (×3): qty 60, 30d supply, fill #3
  Filled 2024-06-22: qty 60, 30d supply, fill #4
  Filled 2024-07-24 – 2024-07-30 (×2): qty 60, 30d supply, fill #5
  Filled 2024-08-27: qty 60, 30d supply, fill #6

## 2024-03-06 MED ORDER — CERTAVITE/ANTIOXIDANTS PO TABS
1.0000 | ORAL_TABLET | Freq: Every day | ORAL | 11 refills | Status: AC
  Filled 2024-03-06 (×2): qty 30, 30d supply, fill #0
  Filled 2024-03-28: qty 30, 30d supply, fill #1
  Filled 2024-04-26: qty 30, 30d supply, fill #2
  Filled 2024-05-23: qty 30, 30d supply, fill #3
  Filled 2024-06-22: qty 30, 30d supply, fill #4
  Filled 2024-07-24 – 2024-07-30 (×2): qty 30, 30d supply, fill #5
  Filled 2024-08-27: qty 30, 30d supply, fill #6

## 2024-03-06 MED ORDER — DAPAGLIFLOZIN PROPANEDIOL 10 MG PO TABS
10.0000 mg | ORAL_TABLET | Freq: Every morning | ORAL | 11 refills | Status: AC
  Filled 2024-03-06 (×2): qty 30, 30d supply, fill #0
  Filled 2024-03-28: qty 30, 30d supply, fill #1
  Filled 2024-04-26: qty 30, 30d supply, fill #2
  Filled 2024-05-23: qty 30, 30d supply, fill #3
  Filled 2024-06-22: qty 30, 30d supply, fill #4
  Filled 2024-07-24 – 2024-07-30 (×2): qty 30, 30d supply, fill #5
  Filled 2024-08-27: qty 30, 30d supply, fill #6

## 2024-03-06 MED ORDER — LEVOCETIRIZINE DIHYDROCHLORIDE 5 MG PO TABS
5.0000 mg | ORAL_TABLET | Freq: Every evening | ORAL | 11 refills | Status: AC
  Filled 2024-03-06 (×2): qty 30, 30d supply, fill #0
  Filled 2024-03-28: qty 30, 30d supply, fill #1
  Filled 2024-04-26: qty 30, 30d supply, fill #2
  Filled 2024-05-23: qty 30, 30d supply, fill #3
  Filled 2024-06-22: qty 30, 30d supply, fill #4
  Filled 2024-07-24 – 2024-07-30 (×2): qty 30, 30d supply, fill #5
  Filled 2024-08-27: qty 30, 30d supply, fill #6

## 2024-03-06 MED ORDER — SERTRALINE HCL 25 MG PO TABS
25.0000 mg | ORAL_TABLET | Freq: Every day | ORAL | 11 refills | Status: AC
  Filled 2024-03-06 (×2): qty 30, 30d supply, fill #0
  Filled 2024-03-28: qty 30, 30d supply, fill #1
  Filled 2024-04-26: qty 30, 30d supply, fill #2
  Filled 2024-05-23: qty 30, 30d supply, fill #3
  Filled 2024-06-22: qty 30, 30d supply, fill #4
  Filled 2024-07-24 – 2024-07-30 (×2): qty 30, 30d supply, fill #5
  Filled 2024-08-27: qty 30, 30d supply, fill #6

## 2024-03-06 MED FILL — Trazodone HCl Tab 50 MG: ORAL | 30 days supply | Qty: 30 | Fill #0 | Status: CN

## 2024-03-06 MED FILL — Trazodone HCl Tab 50 MG: ORAL | 30 days supply | Qty: 30 | Fill #0 | Status: AC

## 2024-03-07 ENCOUNTER — Other Ambulatory Visit: Payer: Self-pay

## 2024-03-08 ENCOUNTER — Other Ambulatory Visit: Payer: Self-pay

## 2024-03-08 ENCOUNTER — Other Ambulatory Visit (HOSPITAL_COMMUNITY): Payer: Self-pay

## 2024-03-19 ENCOUNTER — Other Ambulatory Visit: Payer: Self-pay | Admitting: Internal Medicine

## 2024-03-19 DIAGNOSIS — F02A0 Dementia in other diseases classified elsewhere, mild, without behavioral disturbance, psychotic disturbance, mood disturbance, and anxiety: Secondary | ICD-10-CM

## 2024-03-20 ENCOUNTER — Ambulatory Visit

## 2024-03-26 ENCOUNTER — Other Ambulatory Visit (HOSPITAL_COMMUNITY): Payer: Self-pay | Admitting: Neurology

## 2024-03-26 DIAGNOSIS — R059 Cough, unspecified: Secondary | ICD-10-CM

## 2024-03-26 DIAGNOSIS — R1310 Dysphagia, unspecified: Secondary | ICD-10-CM

## 2024-03-28 ENCOUNTER — Other Ambulatory Visit (HOSPITAL_COMMUNITY): Payer: Self-pay

## 2024-03-28 ENCOUNTER — Other Ambulatory Visit: Payer: Self-pay

## 2024-03-28 MED FILL — Trazodone HCl Tab 50 MG: ORAL | 30 days supply | Qty: 30 | Fill #1 | Status: AC

## 2024-03-30 ENCOUNTER — Other Ambulatory Visit: Payer: Self-pay

## 2024-03-30 ENCOUNTER — Other Ambulatory Visit (HOSPITAL_COMMUNITY): Payer: Self-pay

## 2024-04-02 ENCOUNTER — Other Ambulatory Visit: Payer: Self-pay

## 2024-04-04 ENCOUNTER — Other Ambulatory Visit: Payer: Self-pay

## 2024-04-10 ENCOUNTER — Ambulatory Visit (HOSPITAL_COMMUNITY)
Admission: RE | Admit: 2024-04-10 | Discharge: 2024-04-10 | Disposition: A | Discharge status: Home / Self Care | Source: Ambulatory Visit | Attending: Internal Medicine | Admitting: Internal Medicine

## 2024-04-10 DIAGNOSIS — R059 Cough, unspecified: Secondary | ICD-10-CM

## 2024-04-10 DIAGNOSIS — F0280 Dementia in other diseases classified elsewhere without behavioral disturbance: Secondary | ICD-10-CM | POA: Insufficient documentation

## 2024-04-10 DIAGNOSIS — R634 Abnormal weight loss: Secondary | ICD-10-CM | POA: Insufficient documentation

## 2024-04-10 DIAGNOSIS — G3101 Pick's disease: Secondary | ICD-10-CM | POA: Insufficient documentation

## 2024-04-10 DIAGNOSIS — R1310 Dysphagia, unspecified: Secondary | ICD-10-CM

## 2024-04-10 DIAGNOSIS — R1311 Dysphagia, oral phase: Secondary | ICD-10-CM | POA: Diagnosis present

## 2024-04-10 NOTE — Progress Notes (Signed)
 Modified Barium Swallow Study  Patient Details  Name: Meghan Hickman MRN: [id number] Date of Birth: April 05, 1951  Today's Date: 04/10/2024  Modified Barium Swallow completed.  Full report located under Chart Review in the Imaging Section.  History of Present Illness Patient is a 74 y.o. woman with an MBS order placed on 8/18 for an unspecified type of cough as well as unspecified type of dysphagia. Patient used a device to communicate with the SLP during evaluation. Patient reports that she has been coughing with liquids for about a year. She denied ever feeling like her food gets stuck in her throat.   Clinical Impression Patient presents with oral phase dysphagia. No penetration or aspiration was noted throughout the MBS. Patient had prolonged mastication and bolus preparation as well as trace residue lining her tongue after swallow. Patient also exhibited poor bolus cohesion, leading to partial boluses of puree and mechanical soft solids collecting in vallecular sinus while patient still transiting the rest of the bolus in oral cavity. Patient used AAC device to communicate and acknowledged that it takes her a long time to chew her food. SLP and patient both agreed there was no need for SLP follow up. SLP reached out to niece to update niece on the evaluation results. Recommending continue with PO diet and thin liquids. Could consider SLP at facility to further assess patient's toleration of solid texture PO's. Factors that may increase risk of adverse event in presence of aspiration Noe & Lianne 2021):    DIGEST Swallow Severity Rating*  Safety: 0  Efficiency:0  Overall Pharyngeal Swallow Severity: 0 1: mild; 2: moderate; 3: severe; 4: profound  *The Dynamic Imaging Grade of Swallowing Toxicity is standardized for the head and neck cancer population, however, demonstrates promising clinical applications across populations to standardize the clinical rating of pharyngeal swallow safety  and severity.   Swallow Evaluation Recommendations Recommendations: PO diet PO Diet Recommendation: Regular;Thin liquids (Level 0) Liquid Administration via: Spoon;Cup;Straw Medication Administration: Other (Comment) (as tolerated.) Supervision: Patient able to self-feed Postural changes: Position pt fully upright for meals Oral care recommendations: Oral care BID (2x/day)     Damien Hy  Graduate SLP Clinican

## 2024-04-23 ENCOUNTER — Other Ambulatory Visit: Payer: Self-pay

## 2024-04-26 ENCOUNTER — Other Ambulatory Visit: Payer: Self-pay

## 2024-04-26 MED FILL — Trazodone HCl Tab 50 MG: ORAL | 30 days supply | Qty: 30 | Fill #2 | Status: AC

## 2024-04-27 ENCOUNTER — Other Ambulatory Visit: Payer: Self-pay

## 2024-05-23 ENCOUNTER — Other Ambulatory Visit: Payer: Self-pay

## 2024-05-23 MED FILL — Trazodone HCl Tab 50 MG: ORAL | 30 days supply | Qty: 30 | Fill #3 | Status: AC

## 2024-05-24 ENCOUNTER — Other Ambulatory Visit: Payer: Self-pay

## 2024-05-28 ENCOUNTER — Other Ambulatory Visit: Payer: Self-pay

## 2024-06-22 ENCOUNTER — Other Ambulatory Visit: Payer: Self-pay

## 2024-06-22 MED FILL — Trazodone HCl Tab 50 MG: ORAL | 30 days supply | Qty: 30 | Fill #4 | Status: AC

## 2024-06-28 ENCOUNTER — Other Ambulatory Visit: Payer: Self-pay

## 2024-07-24 ENCOUNTER — Other Ambulatory Visit: Payer: Self-pay

## 2024-07-24 MED FILL — Trazodone HCl Tab 50 MG: ORAL | 30 days supply | Qty: 30 | Fill #5 | Status: CN

## 2024-07-30 ENCOUNTER — Other Ambulatory Visit (HOSPITAL_COMMUNITY): Payer: Self-pay

## 2024-07-30 ENCOUNTER — Other Ambulatory Visit: Payer: Self-pay

## 2024-07-31 ENCOUNTER — Other Ambulatory Visit (HOSPITAL_COMMUNITY): Payer: Self-pay

## 2024-08-22 ENCOUNTER — Emergency Department (HOSPITAL_COMMUNITY)

## 2024-08-22 ENCOUNTER — Encounter (HOSPITAL_COMMUNITY): Payer: Self-pay | Admitting: Internal Medicine

## 2024-08-22 ENCOUNTER — Inpatient Hospital Stay (HOSPITAL_COMMUNITY)
Admission: EM | Admit: 2024-08-22 | Discharge: 2024-08-26 | DRG: 378 | Disposition: A | Discharge status: Home / Self Care | Source: Skilled Nursing Facility | Attending: Student | Admitting: Student

## 2024-08-22 DIAGNOSIS — Z803 Family history of malignant neoplasm of breast: Secondary | ICD-10-CM | POA: Diagnosis not present

## 2024-08-22 DIAGNOSIS — K7581 Nonalcoholic steatohepatitis (NASH): Secondary | ICD-10-CM | POA: Diagnosis present

## 2024-08-22 DIAGNOSIS — K219 Gastro-esophageal reflux disease without esophagitis: Secondary | ICD-10-CM | POA: Diagnosis present

## 2024-08-22 DIAGNOSIS — Z833 Family history of diabetes mellitus: Secondary | ICD-10-CM | POA: Diagnosis not present

## 2024-08-22 DIAGNOSIS — K5731 Diverticulosis of large intestine without perforation or abscess with bleeding: Principal | ICD-10-CM | POA: Diagnosis present

## 2024-08-22 DIAGNOSIS — F0280 Dementia in other diseases classified elsewhere without behavioral disturbance: Secondary | ICD-10-CM | POA: Diagnosis present

## 2024-08-22 DIAGNOSIS — G309 Alzheimer's disease, unspecified: Secondary | ICD-10-CM | POA: Diagnosis present

## 2024-08-22 DIAGNOSIS — I10 Essential (primary) hypertension: Secondary | ICD-10-CM | POA: Diagnosis present

## 2024-08-22 DIAGNOSIS — G4733 Obstructive sleep apnea (adult) (pediatric): Secondary | ICD-10-CM | POA: Diagnosis present

## 2024-08-22 DIAGNOSIS — Z823 Family history of stroke: Secondary | ICD-10-CM

## 2024-08-22 DIAGNOSIS — Z7984 Long term (current) use of oral hypoglycemic drugs: Secondary | ICD-10-CM | POA: Diagnosis not present

## 2024-08-22 DIAGNOSIS — G3101 Pick's disease: Secondary | ICD-10-CM | POA: Diagnosis present

## 2024-08-22 DIAGNOSIS — D62 Acute posthemorrhagic anemia: Principal | ICD-10-CM | POA: Diagnosis present

## 2024-08-22 DIAGNOSIS — F32A Depression, unspecified: Secondary | ICD-10-CM | POA: Diagnosis present

## 2024-08-22 DIAGNOSIS — E876 Hypokalemia: Secondary | ICD-10-CM | POA: Diagnosis present

## 2024-08-22 DIAGNOSIS — F02C3 Dementia in other diseases classified elsewhere, severe, with mood disturbance: Secondary | ICD-10-CM | POA: Diagnosis present

## 2024-08-22 DIAGNOSIS — E785 Hyperlipidemia, unspecified: Secondary | ICD-10-CM | POA: Diagnosis present

## 2024-08-22 DIAGNOSIS — Z8249 Family history of ischemic heart disease and other diseases of the circulatory system: Secondary | ICD-10-CM

## 2024-08-22 DIAGNOSIS — K922 Gastrointestinal hemorrhage, unspecified: Secondary | ICD-10-CM | POA: Diagnosis present

## 2024-08-22 DIAGNOSIS — E119 Type 2 diabetes mellitus without complications: Secondary | ICD-10-CM | POA: Diagnosis present

## 2024-08-22 DIAGNOSIS — Z79899 Other long term (current) drug therapy: Secondary | ICD-10-CM | POA: Diagnosis not present

## 2024-08-22 DIAGNOSIS — Z7982 Long term (current) use of aspirin: Secondary | ICD-10-CM

## 2024-08-22 DIAGNOSIS — D72829 Elevated white blood cell count, unspecified: Secondary | ICD-10-CM | POA: Diagnosis present

## 2024-08-22 DIAGNOSIS — K648 Other hemorrhoids: Secondary | ICD-10-CM | POA: Diagnosis present

## 2024-08-22 DIAGNOSIS — E118 Type 2 diabetes mellitus with unspecified complications: Secondary | ICD-10-CM | POA: Diagnosis present

## 2024-08-22 DIAGNOSIS — Z87891 Personal history of nicotine dependence: Secondary | ICD-10-CM | POA: Diagnosis not present

## 2024-08-22 LAB — COMPREHENSIVE METABOLIC PANEL WITH GFR
ALT: 35 U/L (ref 0–44)
AST: 48 U/L — ABNORMAL HIGH (ref 15–41)
Albumin: 3.4 g/dL — ABNORMAL LOW (ref 3.5–5.0)
Alkaline Phosphatase: 148 U/L — ABNORMAL HIGH (ref 38–126)
Anion gap: 7 (ref 5–15)
BUN: 12 mg/dL (ref 8–23)
CO2: 26 mmol/L (ref 22–32)
Calcium: 8.7 mg/dL — ABNORMAL LOW (ref 8.9–10.3)
Chloride: 114 mmol/L — ABNORMAL HIGH (ref 98–111)
Creatinine, Ser: 0.79 mg/dL (ref 0.44–1.00)
GFR, Estimated: >60 mL/min
Glucose, Bld: 109 mg/dL — ABNORMAL HIGH (ref 70–99)
Potassium: 3.9 mmol/L (ref 3.5–5.1)
Sodium: 147 mmol/L — ABNORMAL HIGH (ref 135–145)
Total Bilirubin: 0.5 mg/dL (ref 0.0–1.2)
Total Protein: 6.6 g/dL (ref 6.5–8.1)

## 2024-08-22 LAB — CBC WITH DIFFERENTIAL/PLATELET
Abs Immature Granulocytes: 0.07 K/uL (ref 0.00–0.07)
Basophils Absolute: 0.1 K/uL (ref 0.0–0.1)
Basophils Relative: 0 %
Eosinophils Absolute: 0.3 K/uL (ref 0.0–0.5)
Eosinophils Relative: 2 %
HCT: 21.7 % — ABNORMAL LOW (ref 36.0–46.0)
Hemoglobin: 6.5 g/dL — CL (ref 12.0–15.0)
Immature Granulocytes: 1 %
Lymphocytes Relative: 28 %
Lymphs Abs: 3.9 K/uL (ref 0.7–4.0)
MCH: 27.1 pg (ref 26.0–34.0)
MCHC: 30 g/dL (ref 30.0–36.0)
MCV: 90.4 fL (ref 80.0–100.0)
Monocytes Absolute: 0.6 K/uL (ref 0.1–1.0)
Monocytes Relative: 4 %
Neutro Abs: 9.4 K/uL — ABNORMAL HIGH (ref 1.7–7.7)
Neutrophils Relative %: 65 %
Platelets: 312 K/uL (ref 150–400)
RBC: 2.4 MIL/uL — ABNORMAL LOW (ref 3.87–5.11)
RDW: 14.6 % (ref 11.5–15.5)
WBC: 14.2 K/uL — ABNORMAL HIGH (ref 4.0–10.5)
nRBC: 0 % (ref 0.0–0.2)

## 2024-08-22 LAB — I-STAT CHEM 8, ED
BUN: 14 mg/dL (ref 8–23)
Calcium, Ion: 1.16 mmol/L (ref 1.15–1.40)
Chloride: 111 mmol/L (ref 98–111)
Creatinine, Ser: 0.9 mg/dL (ref 0.44–1.00)
Glucose, Bld: 109 mg/dL — ABNORMAL HIGH (ref 70–99)
HCT: 22 % — ABNORMAL LOW (ref 36.0–46.0)
Hemoglobin: 7.5 g/dL — ABNORMAL LOW (ref 12.0–15.0)
Potassium: 4.1 mmol/L (ref 3.5–5.1)
Sodium: 147 mmol/L — ABNORMAL HIGH (ref 135–145)
TCO2: 26 mmol/L (ref 22–32)

## 2024-08-22 LAB — ABO/RH: ABO/RH(D): A POS

## 2024-08-22 MED ORDER — SODIUM CHLORIDE 0.9% IV SOLUTION: Freq: Once | INTRAVENOUS | Status: DC

## 2024-08-22 MED ORDER — INSULIN ASPART 100 UNIT/ML IJ SOLN
0.0000 [IU] | INTRAMUSCULAR | Status: DC
  Administered 2024-08-23: 2 [IU] via SUBCUTANEOUS
  Filled 2024-08-22: qty 2

## 2024-08-22 MED ORDER — SODIUM CHLORIDE 0.9% FLUSH
3.0000 mL | Freq: Two times a day (BID) | INTRAVENOUS | Status: DC
  Administered 2024-08-22 – 2024-08-25 (×7): 3 mL via INTRAVENOUS

## 2024-08-22 MED ORDER — ACETAMINOPHEN 325 MG PO TABS
650.0000 mg | ORAL_TABLET | Freq: Four times a day (QID) | ORAL | Status: DC | PRN
  Filled 2024-08-22: qty 2

## 2024-08-22 MED ORDER — KCL-LACTATED RINGERS-D5W 20 MEQ/L IV SOLN
INTRAVENOUS | Status: AC
  Filled 2024-08-22 (×2): qty 1000

## 2024-08-22 MED ORDER — ONDANSETRON HCL 4 MG PO TABS: 4.0000 mg | ORAL_TABLET | Freq: Four times a day (QID) | ORAL | Status: DC | PRN

## 2024-08-22 MED ORDER — ONDANSETRON HCL 4 MG/2ML IJ SOLN: 4.0000 mg | Freq: Four times a day (QID) | INTRAMUSCULAR | Status: DC | PRN

## 2024-08-22 MED ORDER — MORPHINE SULFATE (PF) 2 MG/ML IV SOLN: 2.0000 mg | INTRAVENOUS | Status: DC | PRN

## 2024-08-22 MED ORDER — TRAZODONE HCL 50 MG PO TABS: 25.0000 mg | ORAL_TABLET | Freq: Every evening | ORAL | Status: DC | PRN

## 2024-08-22 MED ORDER — OXYCODONE HCL 5 MG PO TABS: 5.0000 mg | ORAL_TABLET | ORAL | Status: DC | PRN

## 2024-08-22 MED ORDER — IOHEXOL 300 MG/ML  SOLN
80.0000 mL | Freq: Once | INTRAMUSCULAR | Status: AC | PRN
  Administered 2024-08-22: 80 mL via INTRAVENOUS

## 2024-08-22 MED ORDER — HYDRALAZINE HCL 20 MG/ML IJ SOLN: 10.0000 mg | INTRAMUSCULAR | Status: DC | PRN

## 2024-08-22 MED ORDER — LACTATED RINGERS IV SOLN: INTRAVENOUS | Status: DC

## 2024-08-22 MED ORDER — ACETAMINOPHEN 650 MG RE SUPP: 650.0000 mg | Freq: Four times a day (QID) | RECTAL | Status: DC | PRN

## 2024-08-22 NOTE — ED Provider Notes (Signed)
 Received patient in turnover from Dr. Dasie.  Please see their note for further details of Hx, PE.  Briefly patient is a 74 y.o. female with a GI Bleeding .  Patient with GI bleed.  BRBPR.  Plan for labs.    BUN is normal.  Hemoglobin 6.5.  Down from baseline of 12.  I was initially told that the patient refused blood transfusion though when I discussed it with the patient and family they are willing to have a blood transfusion performed.  Will order a unit of blood.   CRITICAL CARE Performed by: Toribio Belvie Quale   Total critical care time: 35 minutes  Critical care time was exclusive of separately billable procedures and treating other patients.  Critical care was necessary to treat or prevent imminent or life-threatening deterioration.  Critical care was time spent personally by me on the following activities: development of treatment plan with patient and/or surrogate as well as nursing, discussions with consultants, evaluation of patient's response to treatment, examination of patient, obtaining history from patient or surrogate, ordering and performing treatments and interventions, ordering and review of laboratory studies, ordering and review of radiographic studies, pulse oximetry and re-evaluation of patient's condition.    Quale Share, DO 08/22/24 1648

## 2024-08-22 NOTE — H&P (Addendum)
 " History and Physical    Meghan Hickman FMW:[id number] DOB: 1951/06/13 DOA: 08/22/2024  PCP: Joshua Debby CROME, MD  Patient coming from: Home  I have personally briefly reviewed patient's old medical records in [facility]  Chief Complaint: Lower GI bleeding  HPI: Meghan Hickman is a 74 y.o. female with medical history significant of primary progressive aphasia, Alzheimer's dementia on Namenda , depression, diabetes type 2, gastroesophageal reflux disease, hypertension, obstructive sleep apnea on CPAP, nonalcoholic steatohepatitis, and internal hemorrhoids requiring banding in 2019 who presents to the emergency department with the complaint blood from rectum.  History is difficult to obtain.  The patient indicates the a hand-held device but during our visit had difficulty getting it to function properly for her. Earlier her caregiver reported that she has had 3 days of intermittent dark red blood per rectum and show the ER physician I photo of the copious amounts of bright red blood.  Patient is nonverbal but she is not having any emesis.  She does not indicate any abdominal discomfort she does not take any blood thinners as noted above she has a history of internal hemorrhoids requiring banding in 2019.  ED Course: Hemoglobin found to have dropped from 12 in May to 6.5 today.  CT scan of the abdomen and pelvis showed colonic diverticulosis no bowel obstruction no acute diverticulitis normal appendix.  Alk phos 148, albumin 3.4, AST 48, calcium  low at 8.7, glucose slightly elevated at 109, chloride 114, sodium 147. Patient personally consented to blood and signed her consent form and blood transfusion was ordered.  Review of Systems: As per HPI otherwise all other systems reviewed and  negative.   Past Medical History:  Diagnosis Date   Arthritis    Depression    Diabetes mellitus without complication (HCC)    Diverticulitis    GERD (gastroesophageal reflux disease)    Headache     Heart murmur    Hyperlipidemia    Hypertension    Speech problem    Wears glasses     Past Surgical History:  Procedure Laterality Date   CHOLECYSTECTOMY N/A 12/05/2014   Procedure: LAPAROSCOPIC CHOLECYSTECTOMY;  Surgeon: Donnice Bury, MD;  Location: [facility] OR;  Service: General;  Laterality: N/A;   SHOULDER ARTHROSCOPY WITH SUBACROMIAL DECOMPRESSION AND BICEP TENDON REPAIR Right 04/23/2013   Procedure: SHOULDER ARTHROSCOPY WITH SUBACROMIAL DECOMPRESSION AND BICEP TENDON TENOTOMY;  Surgeon: Eva Elsie Herring, MD;  Location: [facility];  Service: Orthopedics;  Laterality: Right;    Social History   Social History Narrative   Lives at home alone. 24/7 caretaker   Right-handed.   2 cups caffeine per day.     reports that she quit smoking about 12 years ago. Her smoking use included cigarettes. She started smoking about 12 years ago. She has a 10 pack-year smoking history. She has been exposed to tobacco smoke. She has never used smokeless tobacco. She reports that she does not drink alcohol and does not use drugs.  Allergies[1]  Family History  Problem Relation Age of Onset   Stroke Mother    Hypertension Mother    Diabetes Mother    Breast cancer Mother    Healthy Father    Heart disease Brother      Prior to Admission medications  Medication Sig Start Date End Date Taking? Authorizing Provider  almotriptan  (AXERT ) 12.5 MG tablet Take 1 tablet (12.5 mg total) by mouth daily as needed for migraine. 12/08/22   Joshua Debby CROME, MD  ALPRAZolam  (XANAX ) 1 MG tablet Take 1 tablet (1 mg total) by mouth 2 (two) times daily as needed for anxiety. 12/08/22   Joshua Debby CROME, MD  aspirin  81 MG chewable tablet Chew by mouth.    [provider]  atorvastatin  (LIPITOR) 20 MG tablet Take 20 mg by mouth daily.    [provider]  atorvastatin  (LIPITOR) 20 MG tablet Take 1 tablet (20 mg total) by mouth daily. 02/27/24   Johnston, John D, MD  beclomethasone  (QVAR  REDIHALER) 80 MCG/ACT inhaler Inhale 1 puff into the lungs 2 (two) times daily. 05/23/20   Kip Ade, NP  blood glucose meter kit and supplies Dispense based on patient and insurance preference. Use up to three times daily as directed. (FOR ICD-10 E11.65). Dispense 100 lancets and test strips and 1 lancing device to match meter. 05/30/19   Stallings, Zoe A, MD  dapagliflozin  propanediol (FARXIGA ) 10 MG TABS tablet Take 1 tablet (10 mg total) by mouth daily before breakfast. Please schedule an appt with PCP 11/07/23   Joshua Debby CROME, MD  dapagliflozin  propanediol (FARXIGA ) 10 MG TABS tablet Take 1 tablet (10 mg total) by mouth in the morning before breakfast. 02/27/24   Rudolpho Norleen BIRCH, MD  DULoxetine  (CYMBALTA ) 60 MG capsule TAKE 1 CAPSULE BY MOUTH EVERY DAY 03/03/24   Joshua Debby CROME, MD  famotidine  (PEPCID ) 40 MG tablet Take 1 tablet (40 mg total) by mouth daily. 05/30/19   Stallings, Zoe A, MD  gabapentin  (NEURONTIN ) 300 MG capsule TAKE 1 CAPSULE BY MOUTH THREE TIMES A DAY 12/08/22   Joshua Debby CROME, MD  ipratropium (ATROVENT ) 0.03 % nasal spray Place 2 sprays into both nostrils 2 (two) times daily. 06/23/16   Arloa Suzen RAMAN, NP  KLOR-CON  M20 20 MEQ tablet TAKE 1 TABLET BY MOUTH TWICE A DAY 08/19/22   Joshua Debby CROME, MD  levocetirizine (XYZAL ) 5 MG tablet TAKE 1 TABLET BY MOUTH EVERY DAY IN THE EVENING 10/04/23   Joshua Debby CROME, MD  levocetirizine (XYZAL ) 5 MG tablet Take 1 tablet (5 mg total) by mouth every evening. 02/27/24   Rudolpho Norleen BIRCH, MD  memantine  (NAMENDA ) 10 MG tablet Take 1 tablet (10 mg total) by mouth 2 (two) times daily. 02/27/24   Rudolpho Norleen BIRCH, MD  memantine  (NAMENDA ) 10 MG tablet Take 1 tablet (10 mg total) by mouth 2 (two) times daily. NEEDS APPOINTMENT FOR REFILLS. 03/19/24   Joshua Debby CROME, MD  metoprolol  succinate (TOPROL -XL) 25 MG 24 hr tablet TAKE 1 TABLET BY MOUTH DAILY. PLEASE SCHEDULE AN APPT WITH PCP 01/24/24   Joshua Debby CROME, MD  Multiple  Vitamins-Minerals (CERTAVITE/ANTIOXIDANTS) TABS Take 1 tablet by mouth daily. 02/27/24   Rudolpho Norleen BIRCH, MD  Multiple Vitamins-Minerals (MULTIVITAMIN & MINERAL PO) Take 1 tablet by mouth daily.    [provider]  ONETOUCH ULTRA test strip USE UP TO 3 TIMES A DAY AS DIRECTED 06/24/19   Stallings, Zoe A, MD  rivastigmine (EXELON) 4.5 MG capsule Take 4.5 mg by mouth 2 (two) times daily. 07/12/23 07/11/24  [provider]  sertraline  (ZOLOFT ) 25 MG tablet Take 1 tablet (25 mg total) by mouth daily. 02/27/24   Rudolpho Norleen BIRCH, MD  traMADol  (ULTRAM ) 50 MG tablet TAKE 1 TABLET BY MOUTH EVERY 6 HOURS AS NEEDED. 05/05/22   Joshua Debby CROME, MD  traZODone  (DESYREL ) 50 MG tablet Take 1 tablet (50 mg total) by mouth at bedtime. 02/27/24   Rudolpho Norleen BIRCH, MD  Physical Exam:  Constitutional: NAD, calm, comfortable Vitals:   08/22/24 1445 08/22/24 1500 08/22/24 1530 08/22/24 1545  BP: (!) 122/55 (!) 119/53 119/63 (!) 142/74  Pulse: 77 69 (!) 106 (!) 101  Resp:    18  Temp:      TempSrc:      SpO2: 95% 95% 92% 99%   Eyes: PERRL, lids and conjunctivae pale ENMT: Mucous membranes are moist. Posterior pharynx clear of any exudate or lesions.Normal dentition.  Neck: normal, supple, no masses, no thyromegaly Respiratory: clear to auscultation bilaterally, no wheezing, no crackles. Normal respiratory effort. No accessory muscle use.  Cardiovascular: Regular rate and rhythm, no murmurs / rubs / gallops. No extremity edema. 2+ pedal pulses. No carotid bruits.  Abdomen: no tenderness, no masses palpated. No hepatosplenomegaly. Bowel sounds positive.  Musculoskeletal: no clubbing / cyanosis. No joint deformity upper and lower extremities. Good ROM, no contractures. Normal muscle tone.  Skin: no rashes, lesions, ulcers. No induration Neurologic: CN 2-12 grossly intact. Sensation intact, DTR normal. Strength 5/5 in all 4.  Psychiatric:  Normal mood.  Normal affect   Labs on Admission: I have  personally reviewed following labs and imaging studies  CBC: Recent Labs  Lab 08/22/24 1440 08/22/24 1557  WBC  --  14.2*  NEUTROABS  --  9.4*  HGB 7.5* 6.5*  HCT 22.0* 21.7*  MCV  --  90.4  PLT  --  312   Basic Metabolic Panel: Recent Labs  Lab 08/22/24 1433 08/22/24 1440  NA 147* 147*  K 3.9 4.1  CL 114* 111  CO2 26  --   GLUCOSE 109* 109*  BUN 12 14  CREATININE 0.79 0.90  CALCIUM  8.7*  --    GFR: CrCl cannot be calculated (Unknown ideal weight.). Liver Function Tests: Recent Labs  Lab 08/22/24 1433  AST 48*  ALT 35  ALKPHOS 148*  BILITOT 0.5  PROT 6.6  ALBUMIN 3.4*  Urine analysis:    Component Value Date/Time   COLORURINE YELLOW 08/09/2023 1026   APPEARANCEUR Sl Cloudy (A) 08/09/2023 1026   LABSPEC >=1.030 (A) 08/09/2023 1026   PHURINE 5.5 08/09/2023 1026   GLUCOSEU >=1000 (A) 08/09/2023 1026   HGBUR NEGATIVE 08/09/2023 1026   BILIRUBINUR SMALL (A) 08/09/2023 1026   BILIRUBINUR small (A) 12/02/2017 1736   BILIRUBINUR neg 04/24/2015 1151   KETONESUR NEGATIVE 08/09/2023 1026   PROTEINUR trace (A) 12/02/2017 1736   PROTEINUR NEGATIVE 11/30/2015 1303   UROBILINOGEN 0.2 08/09/2023 1026   NITRITE NEGATIVE 08/09/2023 1026   LEUKOCYTESUR NEGATIVE 08/09/2023 1026    Radiological Exams on Admission: CT ABDOMEN PELVIS W CONTRAST Result Date: 08/22/2024 CLINICAL DATA:  Abdominal pain. EXAM: CT ABDOMEN AND PELVIS WITH CONTRAST TECHNIQUE: Multidetector CT imaging of the abdomen and pelvis was performed using the standard protocol following bolus administration of intravenous contrast. RADIATION DOSE REDUCTION: This exam was performed according to the departmental dose-optimization program which includes automated exposure control, adjustment of the mA and/or kV according to patient size and/or use of iterative reconstruction technique. CONTRAST:  80mL OMNIPAQUE  IOHEXOL  300 MG/ML  SOLN COMPARISON:  CT abdomen pelvis dated 12/03/2017. FINDINGS: Evaluation of this  exam is limited due to respiratory motion. Lower chest: The visualized lung bases are clear. No intra-abdominal free air or free fluid. Hepatobiliary: Subcentimeter hepatic hypodense lesions are too small to characterize. There is mild dilatation, post cholecystectomy. No retained calcified stone noted in the central CBD. Pancreas: Unremarkable. No pancreatic ductal dilatation or surrounding inflammatory changes. Spleen: Normal in  size without focal abnormality. Adrenals/Urinary Tract: The adrenal glands are unremarkable. Small left renal cyst. There is no hydronephrosis on either side. There is symmetric enhancement and excretion of contrast by both kidneys. The visualized ureters and urinary bladder are unremarkable. Stomach/Bowel: There is diffuse colonic diverticulosis. There is no bowel obstruction or active inflammation. The appendix is normal. Vascular/Lymphatic: Mild aortoiliac atherosclerotic disease. The IVC is unremarkable. No portal venous gas. There is no adenopathy. Reproductive: Hysterectomy.  No suspicious adnexal masses. Other: None Musculoskeletal: Degenerative changes of the spine. No acute osseous pathology. Left gluteal lipoma. IMPRESSION: 1. No acute intra-abdominal or pelvic pathology. 2. Colonic diverticulosis. No bowel obstruction. Normal appendix. 3.  Aortic Atherosclerosis (ICD10-I70.0). Electronically Signed   By: Vanetta Chou M.D.   On: 08/22/2024 15:32      Assessment/Plan Principal Problem:   Acute lower GI bleeding Active Problems:   Primary progressive aphasia (HCC)   OSA on CPAP   Type II diabetes mellitus with manifestations (HCC)   NASH (nonalcoholic steatohepatitis)   Hemorrhoids, internal   Anemia due to acute blood loss    1.  Acute lower GI bleeding with history of internal hemorrhoids: Patient with history of banding in 2019 per care everywhere. Unsure whether this is a recurrence of her internal hemorrhoids or a new etiology of GI bleed.  No  colonoscopy results are available in her chart. - Dr. Rochele from [facility] has been consulted. -Patient will be n.p.o. after midnight for possible procedure in the a.m. - Is primarily followed by the [facility] clinic.  2.  Anemia due to acute blood loss: - Patient's hemoglobin with a significant drop from May 2025 no evidence of macrocytosis suggesting a more recent likely acute process. - Transfuse 1 unit of packed red blood cells and recheck H&H in the morning.  Further transfusions if necessary. - Patient apparently has antibodies and we are awaiting further testing from the blood bank to be able to transfuse her.  3.  Diabetes mellitus type 2.   - Hold diabetes medications while NPO. - Consider restarting once patient is eating - fsbg q 4 h while npo  4.  Obstructive sleep Apnea -Will ask family to bring in her CPAP machine when able  5.  Nonalcoholic steatohepatitis: - LFTs slightly elevated as noted above  6.  Incomplete database: - At the time of this dictation still awaiting confirmation on home medications.  Home medication med dosing reconciliation is pending  DVT prophylaxis: SCDs Code Status: Full code Family Communication: Patient's cousin was here with her earlier she is her caregiver and cousin. Disposition Plan: Likely home Consults called: GI Dr. Rochele from [facility] to see patient in a.m. Admission status: Inpatient   Zebedee JAYSON Fujisawa MD FACP [facility] Pager [phone]  How to contact the [facility] Attending or Consulting provider 7A - 7P or covering provider during after hours 7P -7A, for this patient?  Check the care team in [facility] and look for a) attending/consulting [facility] provider listed and b) the [facility] team listed Log into [email or web address] and use [employer]'s universal password to access. If you do not have the password, please contact the hospital operator. Locate the [facility] provider you are looking for under [facility] and page to a number that you  can be directly reached. If you still have difficulty reaching the provider, please page the [facility] (Director on Call) for the Hospitalists listed on amion for assistance.  If 7PM-7AM, please contact night-coverage [email or web address] Password [email or web address]  08/22/2024, 6:32 PM       [1] No Known Allergies  "

## 2024-08-22 NOTE — ED Triage Notes (Signed)
 Pt BIB EMS from [facility] due to rectal bleed x3 days. C/o SOB 2L [place] -room air baseline Hx nonverbal, GI bleeds, dementia, aphasia

## 2024-08-22 NOTE — ED Provider Notes (Signed)
 " [facility] AT [facility] Provider Note   CSN: [id number] Arrival date & time: 08/22/24  1247     Patient presents with: GI Bleeding   Meghan Hickman is a 74 y.o. female.   74 year old female with history of dementia presents due to blood in her diaper.  According to caregiver at bedside, patient has had 3 days of intermittent dark red blood per her rectum.  She did show me a picture of it from this morning which there was copious amounts of bright red blood noted.  Patient herself is nonverbal.  According to caregiver, she is not having any emesis.  She has not noted indicating any abdominal discomfort.  No prior history of GI bleeding.  Does not take any blood thinners       Prior to Admission medications  Medication Sig Start Date End Date Taking? Authorizing Provider  almotriptan  (AXERT ) 12.5 MG tablet Take 1 tablet (12.5 mg total) by mouth daily as needed for migraine. 12/08/22   Joshua Debby CROME, MD  ALPRAZolam  (XANAX ) 1 MG tablet Take 1 tablet (1 mg total) by mouth 2 (two) times daily as needed for anxiety. 12/08/22   Joshua Debby CROME, MD  aspirin  81 MG chewable tablet Chew by mouth.    [provider]  atorvastatin  (LIPITOR) 20 MG tablet Take 20 mg by mouth daily.    [provider]  atorvastatin  (LIPITOR) 20 MG tablet Take 1 tablet (20 mg total) by mouth daily. 02/27/24   Johnston, John D, MD  beclomethasone (QVAR  REDIHALER) 80 MCG/ACT inhaler Inhale 1 puff into the lungs 2 (two) times daily. 05/23/20   Kip Ade, NP  blood glucose meter kit and supplies Dispense based on patient and insurance preference. Use up to three times daily as directed. (FOR ICD-10 E11.65). Dispense 100 lancets and test strips and 1 lancing device to match meter. 05/30/19   Stallings, Zoe A, MD  dapagliflozin  propanediol (FARXIGA ) 10 MG TABS tablet Take 1 tablet (10 mg total) by mouth daily before breakfast. Please schedule an appt with PCP 11/07/23    Joshua Debby CROME, MD  dapagliflozin  propanediol (FARXIGA ) 10 MG TABS tablet Take 1 tablet (10 mg total) by mouth in the morning before breakfast. 02/27/24   Rudolpho Norleen BIRCH, MD  DULoxetine  (CYMBALTA ) 60 MG capsule TAKE 1 CAPSULE BY MOUTH EVERY DAY 03/03/24   Joshua Debby CROME, MD  famotidine  (PEPCID ) 40 MG tablet Take 1 tablet (40 mg total) by mouth daily. 05/30/19   Stallings, Zoe A, MD  gabapentin  (NEURONTIN ) 300 MG capsule TAKE 1 CAPSULE BY MOUTH THREE TIMES A DAY 12/08/22   Joshua Debby CROME, MD  ipratropium (ATROVENT ) 0.03 % nasal spray Place 2 sprays into both nostrils 2 (two) times daily. 06/23/16   Arloa Suzen RAMAN, NP  KLOR-CON  M20 20 MEQ tablet TAKE 1 TABLET BY MOUTH TWICE A DAY 08/19/22   Joshua Debby CROME, MD  levocetirizine (XYZAL ) 5 MG tablet TAKE 1 TABLET BY MOUTH EVERY DAY IN THE EVENING 10/04/23   Joshua Debby CROME, MD  levocetirizine (XYZAL ) 5 MG tablet Take 1 tablet (5 mg total) by mouth every evening. 02/27/24   Rudolpho Norleen BIRCH, MD  memantine  (NAMENDA ) 10 MG tablet Take 1 tablet (10 mg total) by mouth 2 (two) times daily. 02/27/24   Rudolpho Norleen BIRCH, MD  memantine  (NAMENDA ) 10 MG tablet Take 1 tablet (10 mg total) by mouth 2 (two) times daily. NEEDS APPOINTMENT FOR REFILLS. 03/19/24   Joshua,  Debby CROME, MD  metoprolol  succinate (TOPROL -XL) 25 MG 24 hr tablet TAKE 1 TABLET BY MOUTH DAILY. PLEASE SCHEDULE AN APPT WITH PCP 01/24/24   Joshua Debby CROME, MD  Multiple Vitamins-Minerals (CERTAVITE/ANTIOXIDANTS) TABS Take 1 tablet by mouth daily. 02/27/24   Rudolpho Norleen BIRCH, MD  Multiple Vitamins-Minerals (MULTIVITAMIN & MINERAL PO) Take 1 tablet by mouth daily.    [provider]  ONETOUCH ULTRA test strip USE UP TO 3 TIMES A DAY AS DIRECTED 06/24/19   Stallings, Zoe A, MD  rivastigmine (EXELON) 4.5 MG capsule Take 4.5 mg by mouth 2 (two) times daily. 07/12/23 07/11/24  [provider]  sertraline  (ZOLOFT ) 25 MG tablet Take 1 tablet (25 mg total) by mouth daily. 02/27/24   Rudolpho Norleen BIRCH,  MD  traMADol  (ULTRAM ) 50 MG tablet TAKE 1 TABLET BY MOUTH EVERY 6 HOURS AS NEEDED. 05/05/22   Joshua Debby CROME, MD  traZODone  (DESYREL ) 50 MG tablet Take 1 tablet (50 mg total) by mouth at bedtime. 02/27/24   Johnston, John D, MD    Allergies: Patient has no known allergies.    Review of Systems  All other systems reviewed and are negative.   Updated Vital Signs BP (!) 122/46 (BP Location: Right Arm)   Pulse 87   Temp 98 F (36.7 C) (Oral)   Resp 14   SpO2 98%   Physical Exam Vitals and nursing note reviewed. Exam conducted with a chaperone present.  Constitutional:      General: She is not in acute distress.    Appearance: Normal appearance. She is well-developed. She is not toxic-appearing.  HENT:     Head: Normocephalic and atraumatic.  Eyes:     General: Lids are normal.     Conjunctiva/sclera: Conjunctivae normal.     Pupils: Pupils are equal, round, and reactive to light.  Neck:     Thyroid : No thyroid  mass.     Trachea: No tracheal deviation.  Cardiovascular:     Rate and Rhythm: Normal rate and regular rhythm.     Heart sounds: Normal heart sounds. No murmur heard.    No gallop.  Pulmonary:     Effort: Pulmonary effort is normal. No respiratory distress.     Breath sounds: Normal breath sounds. No stridor. No decreased breath sounds, wheezing, rhonchi or rales.  Abdominal:     General: There is no distension.     Palpations: Abdomen is soft.     Tenderness: There is no abdominal tenderness. There is no rebound.  Genitourinary:    Comments: Gross blood noted on DRE Musculoskeletal:        General: No tenderness. Normal range of motion.     Cervical back: Normal range of motion and neck supple.  Skin:    General: Skin is warm and dry.     Findings: No abrasion or rash.  Neurological:     Mental Status: She is alert. Mental status is at baseline.     GCS: GCS eye subscore is 4. GCS verbal subscore is 5. GCS motor subscore is 6.     Cranial Nerves: No cranial  nerve deficit.     Sensory: No sensory deficit.     Motor: Motor function is intact.  Psychiatric:        Attention and Perception: Attention normal.     (all labs ordered are listed, but only abnormal results are displayed) Labs Reviewed  CBC WITH DIFFERENTIAL/PLATELET  COMPREHENSIVE METABOLIC PANEL WITH GFR  I-STAT CHEM 8, ED  TYPE AND SCREEN    EKG: None  Radiology: No results found.   Procedures   Medications Ordered in the ED  lactated ringers  infusion (has no administration in time range)                                    Medical Decision Making Amount and/or Complexity of Data Reviewed Labs: ordered. Radiology: ordered.  Risk Prescription drug management.   Patient's blood work and abdominal CT are pending at this time.  Signed out to Dr. Emil     Final diagnoses:  None    ED Discharge Orders     None          Dasie Faden, MD 08/22/24 1507  "

## 2024-08-23 ENCOUNTER — Inpatient Hospital Stay (HOSPITAL_COMMUNITY)

## 2024-08-23 DIAGNOSIS — G3101 Pick's disease: Secondary | ICD-10-CM | POA: Diagnosis not present

## 2024-08-23 DIAGNOSIS — F0280 Dementia in other diseases classified elsewhere without behavioral disturbance: Secondary | ICD-10-CM

## 2024-08-23 DIAGNOSIS — K648 Other hemorrhoids: Secondary | ICD-10-CM | POA: Diagnosis not present

## 2024-08-23 DIAGNOSIS — D62 Acute posthemorrhagic anemia: Secondary | ICD-10-CM | POA: Diagnosis not present

## 2024-08-23 DIAGNOSIS — K922 Gastrointestinal hemorrhage, unspecified: Secondary | ICD-10-CM | POA: Diagnosis not present

## 2024-08-23 DIAGNOSIS — E118 Type 2 diabetes mellitus with unspecified complications: Secondary | ICD-10-CM

## 2024-08-23 DIAGNOSIS — G4733 Obstructive sleep apnea (adult) (pediatric): Secondary | ICD-10-CM | POA: Diagnosis not present

## 2024-08-23 DIAGNOSIS — K7581 Nonalcoholic steatohepatitis (NASH): Secondary | ICD-10-CM

## 2024-08-23 LAB — CBC WITH DIFFERENTIAL/PLATELET
Abs Immature Granulocytes: 0.08 K/uL — ABNORMAL HIGH (ref 0.00–0.07)
Basophils Absolute: 0 K/uL (ref 0.0–0.1)
Basophils Relative: 0 %
Eosinophils Absolute: 0.5 K/uL (ref 0.0–0.5)
Eosinophils Relative: 4 %
HCT: 27.2 % — ABNORMAL LOW (ref 36.0–46.0)
Hemoglobin: 8.6 g/dL — ABNORMAL LOW (ref 12.0–15.0)
Immature Granulocytes: 1 %
Lymphocytes Relative: 21 %
Lymphs Abs: 2.7 K/uL (ref 0.7–4.0)
MCH: 27.6 pg (ref 26.0–34.0)
MCHC: 31.6 g/dL (ref 30.0–36.0)
MCV: 87.2 fL (ref 80.0–100.0)
Monocytes Absolute: 0.7 K/uL (ref 0.1–1.0)
Monocytes Relative: 6 %
Neutro Abs: 8.8 K/uL — ABNORMAL HIGH (ref 1.7–7.7)
Neutrophils Relative %: 68 %
Platelets: 246 K/uL (ref 150–400)
RBC: 3.12 MIL/uL — ABNORMAL LOW (ref 3.87–5.11)
RDW: 14.8 % (ref 11.5–15.5)
Smear Review: NORMAL
WBC: 12.8 K/uL — ABNORMAL HIGH (ref 4.0–10.5)
nRBC: 0 % (ref 0.0–0.2)

## 2024-08-23 LAB — BASIC METABOLIC PANEL WITH GFR
Anion gap: 5 (ref 5–15)
BUN: 8 mg/dL (ref 8–23)
CO2: 28 mmol/L (ref 22–32)
Calcium: 8.4 mg/dL — ABNORMAL LOW (ref 8.9–10.3)
Chloride: 111 mmol/L (ref 98–111)
Creatinine, Ser: 0.76 mg/dL (ref 0.44–1.00)
GFR, Estimated: >60 mL/min
Glucose, Bld: 97 mg/dL (ref 70–99)
Potassium: 3.3 mmol/L — ABNORMAL LOW (ref 3.5–5.1)
Sodium: 143 mmol/L (ref 135–145)

## 2024-08-23 LAB — HEMOGLOBIN AND HEMATOCRIT, BLOOD
HCT: 26.9 % — ABNORMAL LOW (ref 36.0–46.0)
HCT: 28.1 % — ABNORMAL LOW (ref 36.0–46.0)
Hemoglobin: 8.7 g/dL — ABNORMAL LOW (ref 12.0–15.0)
Hemoglobin: 9 g/dL — ABNORMAL LOW (ref 12.0–15.0)

## 2024-08-23 LAB — CBC
HCT: 19.3 % — ABNORMAL LOW (ref 36.0–46.0)
Hemoglobin: 5.9 g/dL — CL (ref 12.0–15.0)
MCH: 26.9 pg (ref 26.0–34.0)
MCHC: 30.6 g/dL (ref 30.0–36.0)
MCV: 88.1 fL (ref 80.0–100.0)
Platelets: 301 K/uL (ref 150–400)
RBC: 2.19 MIL/uL — ABNORMAL LOW (ref 3.87–5.11)
RDW: 14.7 % (ref 11.5–15.5)
WBC: 11.1 K/uL — ABNORMAL HIGH (ref 4.0–10.5)
nRBC: 0 % (ref 0.0–0.2)

## 2024-08-23 LAB — FOLATE: Folate: >20 ng/mL

## 2024-08-23 LAB — IRON AND TIBC
Iron: 26 ug/dL — ABNORMAL LOW (ref 28–170)
Saturation Ratios: 9 % — ABNORMAL LOW (ref 10.4–31.8)
TIBC: 284 ug/dL (ref 250–450)
UIBC: 259 ug/dL

## 2024-08-23 LAB — GLUCOSE, CAPILLARY
Glucose-Capillary: 107 mg/dL — ABNORMAL HIGH (ref 70–99)
Glucose-Capillary: 109 mg/dL — ABNORMAL HIGH (ref 70–99)
Glucose-Capillary: 118 mg/dL — ABNORMAL HIGH (ref 70–99)
Glucose-Capillary: 124 mg/dL — ABNORMAL HIGH (ref 70–99)
Glucose-Capillary: 125 mg/dL — ABNORMAL HIGH (ref 70–99)
Glucose-Capillary: 84 mg/dL (ref 70–99)
Glucose-Capillary: 93 mg/dL (ref 70–99)

## 2024-08-23 LAB — VITAMIN B12: Vitamin B-12: 778 pg/mL (ref 180–914)

## 2024-08-23 LAB — PREPARE RBC (CROSSMATCH)

## 2024-08-23 LAB — HEMOGLOBIN A1C
Hgb A1c MFr Bld: 5.7 % — ABNORMAL HIGH (ref 4.8–5.6)
Mean Plasma Glucose: 116.89 mg/dL

## 2024-08-23 LAB — RETICULOCYTES
Immature Retic Fract: 32.3 % — ABNORMAL HIGH (ref 2.3–15.9)
RBC.: 3.19 MIL/uL — ABNORMAL LOW (ref 3.87–5.11)
Retic Count, Absolute: 145.1 K/uL (ref 19.0–186.0)
Retic Ct Pct: 4.6 % — ABNORMAL HIGH (ref 0.4–3.1)

## 2024-08-23 LAB — FERRITIN: Ferritin: 53 ng/mL (ref 11–307)

## 2024-08-23 MED ORDER — POTASSIUM CHLORIDE CRYS ER 20 MEQ PO TBCR
60.0000 meq | EXTENDED_RELEASE_TABLET | Freq: Once | ORAL | Status: AC
  Administered 2024-08-23: 60 meq via ORAL
  Filled 2024-08-23: qty 3

## 2024-08-23 MED ORDER — IRON SUCROSE 300 MG IVPB - SIMPLE MED
300.0000 mg | Freq: Once | Status: AC
  Administered 2024-08-23: 300 mg via INTRAVENOUS
  Filled 2024-08-23: qty 300

## 2024-08-23 MED ORDER — IOHEXOL 350 MG/ML SOLN
100.0000 mL | Freq: Once | INTRAVENOUS | Status: AC | PRN
  Administered 2024-08-23: 100 mL via INTRAVENOUS

## 2024-08-23 MED ORDER — INSULIN ASPART 100 UNIT/ML IJ SOLN: 0.0000 [IU] | INTRAMUSCULAR | Status: DC

## 2024-08-23 MED ORDER — SODIUM CHLORIDE 0.9% IV SOLUTION: Freq: Once | INTRAVENOUS | Status: DC

## 2024-08-23 NOTE — Progress Notes (Signed)
 Was informed by MD to hold transfusion for now.  Sharron Petruska C Warden, RN\ 08/23/2024 2:44 PM

## 2024-08-23 NOTE — Progress Notes (Signed)
 Per GI MD [place], repeat H and H at 2030 and if hemoglobin is <7 please transfuse 1U of RBC, orders are in and blood bank req and # sticker are at bedside ready to go to lab. RBC are ready in lab  Laneta JAYSON Rao, RN 08/23/2024 6:43 PM

## 2024-08-23 NOTE — Progress Notes (Addendum)
 Informed by blood bank that patient had positive antibody screen and that second type and screen was sent out. Unable to administer ordered 1 unit RBCs until after second type and screen results. Provider made aware.

## 2024-08-23 NOTE — Progress Notes (Signed)
 Lab called and informed this charge nurse that patient's hgb was critical at 5.9.  Blood noted to still be running at this time.  Dr. Gonfa and primary nurse made aware.  Chiquita LULLA Longs, RN

## 2024-08-23 NOTE — Consult Note (Signed)
 [facility] Consult  Referring Provider: [facility] hospitalist Primary Care Physician:  Joshua Debby CROME, MD Primary Gastroenterologist: Sampson  Reason for Consultation: Rectal bleeding  HPI: Meghan Hickman is a 74 y.o. female with a past medical history of diverticulosis, primary progressive aphasia, Alzheimer's dementia on Namenda , depression, diabetes, GERD, hypertension, obstructive sleep apnea on CPAP, nonalcoholic steatohepatitis presented to the ER with rectal bleeding. Patient lives in an independent living facility The patient was found to have passage of bright red blood per rectum. She uses a hand-held device for communication, seems to be alert but it is difficult to ascertain orientation.  Also of the history is obtained from documentation and from Jon [phone]) works at the facility where she lives. Patient has not been having any abdominal pain. Bowel movement was today morning described as bright red blood, possibly with clots.  Besides aspirin  81 mg, she does not seem to be on antiplatelet/anticoagulation/NSAIDs.  Previous GI workup: Colonoscopy, Dr. Luis, 08/2024: Universal extensive diverticulosis, hemorrhoids, repeat recommended in 10 years  Past Medical History:  Diagnosis Date   Arthritis    Depression    Diabetes mellitus without complication (HCC)    Diverticulitis    GERD (gastroesophageal reflux disease)    Headache    Heart murmur    Hyperlipidemia    Hypertension    Speech problem    Wears glasses     Past Surgical History:  Procedure Laterality Date   CHOLECYSTECTOMY N/A 12/05/2014   Procedure: LAPAROSCOPIC CHOLECYSTECTOMY;  Surgeon: Donnice Bury, MD;  Location: [facility] OR;  Service: General;  Laterality: N/A;   SHOULDER ARTHROSCOPY WITH SUBACROMIAL DECOMPRESSION AND BICEP TENDON REPAIR Right 04/23/2013   Procedure: SHOULDER ARTHROSCOPY WITH SUBACROMIAL DECOMPRESSION AND BICEP TENDON TENOTOMY;  Surgeon: Eva Elsie Herring, MD;  Location: [facility];  Service: Orthopedics;  Laterality: Right;    Prior to Admission medications  Medication Sig Start Date End Date Taking? Authorizing Provider  atorvastatin  (LIPITOR) 20 MG tablet Take 1 tablet (20 mg total) by mouth daily. 02/27/24  Yes Rudolpho Norleen BIRCH, MD  dapagliflozin  propanediol (FARXIGA ) 10 MG TABS tablet Take 1 tablet (10 mg total) by mouth in the morning before breakfast. 02/27/24  Yes Johnston, John D, MD  levocetirizine (XYZAL ) 5 MG tablet Take 1 tablet (5 mg total) by mouth every evening. 02/27/24  Yes Rudolpho Norleen BIRCH, MD  memantine  (NAMENDA ) 10 MG tablet Take 1 tablet (10 mg total) by mouth 2 (two) times daily. 02/27/24  Yes Rudolpho Norleen BIRCH, MD  almotriptan  (AXERT ) 12.5 MG tablet Take 1 tablet (12.5 mg total) by mouth daily as needed for migraine. 12/08/22   Joshua Debby CROME, MD  ALPRAZolam  (XANAX ) 1 MG tablet Take 1 tablet (1 mg total) by mouth 2 (two) times daily as needed for anxiety. 12/08/22   Joshua Debby CROME, MD  aspirin  81 MG chewable tablet Chew by mouth.    [provider]  beclomethasone (QVAR  REDIHALER) 80 MCG/ACT inhaler Inhale 1 puff into the lungs 2 (two) times daily. 05/23/20   Kip Ade, NP  blood glucose meter kit and supplies Dispense based on patient and insurance preference. Use up to three times daily as directed. (FOR ICD-10 E11.65). Dispense 100 lancets and test strips and 1 lancing device to match meter. 05/30/19   Stallings, Zoe A, MD  dapagliflozin  propanediol (FARXIGA ) 10 MG TABS tablet Take 1 tablet (10 mg total) by mouth daily before breakfast. Please schedule an appt with PCP 11/07/23   Joshua Debby CROME,  MD  DULoxetine  (CYMBALTA ) 60 MG capsule TAKE 1 CAPSULE BY MOUTH EVERY DAY 03/03/24   Joshua Debby CROME, MD  famotidine  (PEPCID ) 40 MG tablet Take 1 tablet (40 mg total) by mouth daily. 05/30/19   Stallings, Zoe A, MD  gabapentin  (NEURONTIN ) 300 MG capsule TAKE 1 CAPSULE BY MOUTH THREE TIMES A DAY 12/08/22    Joshua Debby CROME, MD  ipratropium (ATROVENT ) 0.03 % nasal spray Place 2 sprays into both nostrils 2 (two) times daily. Patient taking differently: Place 2 sprays into both nostrils 2 (two) times daily as needed for rhinitis. 06/23/16   Arloa Suzen RAMAN, NP  KLOR-CON  M20 20 MEQ tablet TAKE 1 TABLET BY MOUTH TWICE A DAY 08/19/22   Joshua Debby CROME, MD  levocetirizine (XYZAL ) 5 MG tablet TAKE 1 TABLET BY MOUTH EVERY DAY IN THE EVENING 10/04/23   Joshua Debby CROME, MD  memantine  (NAMENDA ) 10 MG tablet Take 1 tablet (10 mg total) by mouth 2 (two) times daily. NEEDS APPOINTMENT FOR REFILLS. Patient not taking: Reported on 08/22/2024 03/19/24   Joshua Debby CROME, MD  metoprolol  succinate (TOPROL -XL) 25 MG 24 hr tablet TAKE 1 TABLET BY MOUTH DAILY. PLEASE SCHEDULE AN APPT WITH PCP 01/24/24   Joshua Debby CROME, MD  Multiple Vitamins-Minerals (CERTAVITE/ANTIOXIDANTS) TABS Take 1 tablet by mouth daily. 02/27/24   Rudolpho Norleen BIRCH, MD  Multiple Vitamins-Minerals (MULTIVITAMIN & MINERAL PO) Take 1 tablet by mouth daily.    [provider]  ONETOUCH ULTRA test strip USE UP TO 3 TIMES A DAY AS DIRECTED 06/24/19   Stallings, Zoe A, MD  rivastigmine (EXELON) 4.5 MG capsule Take 4.5 mg by mouth 2 (two) times daily. 07/12/23 07/11/24  [provider]  sertraline  (ZOLOFT ) 25 MG tablet Take 1 tablet (25 mg total) by mouth daily. 02/27/24   Rudolpho Norleen BIRCH, MD  traMADol  (ULTRAM ) 50 MG tablet TAKE 1 TABLET BY MOUTH EVERY 6 HOURS AS NEEDED. 05/05/22   Joshua Debby CROME, MD  traZODone  (DESYREL ) 50 MG tablet Take 1 tablet (50 mg total) by mouth at bedtime. 02/27/24   Rudolpho Norleen BIRCH, MD    Current Facility-Administered Medications  Medication Dose Route Frequency Provider Last Rate Last Admin   0.9 %  sodium chloride  infusion (Manually program via Guardrails IV Fluids)   Intravenous Once Emil Share, DO       acetaminophen  (TYLENOL ) tablet 650 mg  650 mg Oral Q6H PRN Jackee Zebedee BROCKS, MD       Or   acetaminophen   (TYLENOL ) suppository 650 mg  650 mg Rectal Q6H PRN Jackee Zebedee BROCKS, MD       dextrose  5% in lactated ringers  with KCl 20 mEq/L infusion   Intravenous Continuous Jackee Zebedee BROCKS, MD   Stopped at 08/23/24 9065   hydrALAZINE  (APRESOLINE ) injection 10 mg  10 mg Intravenous Q4H PRN Sheehan, Theresa C, MD       insulin  aspart (novoLOG ) injection 0-15 Units  0-15 Units Subcutaneous Q4H Jackee Zebedee BROCKS, MD   2 Units at 08/23/24 9549   lactated ringers  infusion   Intravenous Continuous Dasie Faden, MD 125 mL/hr at 08/23/24 0946 New Bag at 08/23/24 0946   morphine  (PF) 2 MG/ML injection 2 mg  2 mg Intravenous Q2H PRN Sheehan, Theresa C, MD       ondansetron  (ZOFRAN ) tablet 4 mg  4 mg Oral Q6H PRN Jackee Zebedee BROCKS, MD       Or   ondansetron  (ZOFRAN ) injection 4 mg  4 mg Intravenous Q6H  PRN Jackee Zebedee BROCKS, MD       oxyCODONE  (Oxy IR/ROXICODONE ) immediate release tablet 5 mg  5 mg Oral Q4H PRN Jackee Zebedee BROCKS, MD       sodium chloride  flush (NS) 0.9 % injection 3 mL  3 mL Intravenous Q12H Jackee Zebedee BROCKS, MD   3 mL at 08/23/24 9066   traZODone  (DESYREL ) tablet 25 mg  25 mg Oral QHS PRN Jackee Zebedee BROCKS, MD        Allergies as of 08/22/2024   (No Known Allergies)    Family History  Problem Relation Age of Onset   Stroke Mother    Hypertension Mother    Diabetes Mother    Breast cancer Mother    Healthy Father    Heart disease Brother     Social History   Socioeconomic History   Marital status: Single    Spouse name: Not on file   Number of children: 0   Years of education: 16   Highest education level: Bachelor's degree (e.g., BA, AB, BS)  Occupational History   Occupation: Research Scientist (physical Sciences)  Tobacco Use   Smoking status: Former    Current packs/day: 0.00    Average packs/day: 0.5 packs/day for 20.0 years (10.0 ttl pk-yrs)    Types: Cigarettes    Start date: 04/30/2012    Quit date: 08/16/2012    Years since quitting: 12.0    Passive exposure: Past   Smokeless  tobacco: Never  Vaping Use   Vaping status: Never Used  Substance and Sexual Activity   Alcohol use: No    Alcohol/week: 0.0 standard drinks of alcohol   Drug use: No   Sexual activity: Never    Comment: was smoking 1/2 pppd  Other Topics Concern   Not on file  Social History Narrative   Lives at home alone. 24/7 caretaker   Right-handed.   2 cups caffeine per day.   Social Drivers of Health   Tobacco Use: Medium Risk (08/16/2024)   Received from [facility]   Patient History    Smoking Tobacco Use: Former    Smokeless Tobacco Use: Never    Passive Exposure: Not on file  Financial Resource Strain: Low Risk  (02/27/2024)   Received from [facility]   Overall Financial Resource Strain (CARDIA)    Difficulty of Paying Living Expenses: Not hard at all  Food Insecurity: No Food Insecurity (02/27/2024)   Received from [facility]   Epic    Within the past 12 months, you worried that your food would run out before you got the money to buy more.: Never true    Within the past 12 months, the food you bought just didn't last and you didn't have money to get more.: Never true  Transportation Needs: No Transportation Needs (02/27/2024)   Received from [facility] - Transportation    In the past 12 months, has lack of transportation kept you from medical appointments or from getting medications?: No    Lack of Transportation (Non-Medical): No  Physical Activity: Insufficiently Active (12/03/2022)   Exercise Vital Sign    Days of Exercise per Week: 2 days    Minutes of Exercise per Session: 10 min  Stress: Stress Concern Present (12/03/2022)   [employer] - Occupational Stress Questionnaire    Feeling of Stress : Rather much  Social Connections: Unknown (07/25/2023)   Social Connection and Isolation Panel  Frequency of Communication with Friends and Family: Once a week     Frequency of Social Gatherings with Friends and Family: Twice a week    Attends Religious Services: Not on Marketing Executive or Organizations: No    Attends Banker Meetings: Not on file    Marital Status: Not on file  Intimate Partner Violence: Not At Risk (10/13/2023)   Humiliation, Afraid, Rape, and Kick questionnaire    Fear of Current or Ex-Partner: No    Emotionally Abused: No    Physically Abused: No    Sexually Abused: No  Depression (PHQ2-9): Low Risk (12/21/2023)   Depression (PHQ2-9)    PHQ-2 Score: 0  Alcohol Screen: Not on file  Housing: Low Risk  (02/27/2024)   Received from [facility]   Epic    In the last 12 months, was there a time when you were not able to pay the mortgage or rent on time?: No    In the past 12 months, how many times have you moved where you were living?: 0    At any time in the past 12 months, were you homeless or living in a shelter (including now)?: No  Utilities: Not At Risk (02/27/2024)   Received from [facility]   Epic    In the past 12 months has the electric, gas, oil, or water company threatened to shut off services in your home?: No  Health Literacy: Not on file    Review of Systems: As per HPI.  Physical Exam: Vital signs in last 24 hours: Temp:  [97.4 F (36.3 C)-99.4 F (37.4 C)] 98.1 F (36.7 C) (01/15 0931) Pulse Rate:  [69-110] 93 (01/15 0931) Resp:  [10-19] 15 (01/15 0931) BP: (100-148)/(46-76) 135/65 (01/15 0931) SpO2:  [92 %-99 %] 99 % (01/15 0931) Last BM Date : 08/22/24  General:   Alert,  Well-developed, well-nourished, pleasant and in NAD Head:  Normocephalic and atraumatic. Eyes:  Sclera clear, no icterus.   Prominent pallor. Ears:  Normal auditory acuity. Nose:  No deformity, discharge,  or lesions. Mouth:  No deformity or lesions.  Oropharynx pink & moist. Neck:  Supple; no masses or thyromegaly. Lungs:  Clear throughout to auscultation.   No  wheezes, crackles, or rhonchi. No acute distress. Heart:  Regular rate and rhythm; no murmurs, clicks, rubs,  or gallops. Extremities:  Without clubbing or edema. Neurologic:  Alert and nonverbal. Skin:  Intact without significant lesions or rashes. Psych:  Alert and cooperative. Normal mood and affect. Abdomen:  Soft, nontender and nondistended. No masses, hepatosplenomegaly or hernias noted. Normal bowel sounds, without guarding, and without rebound.         Lab Results: Recent Labs    08/22/24 1440 08/22/24 1557 08/23/24 0554  WBC  --  14.2* 11.1*  HGB 7.5* 6.5* 5.9*  HCT 22.0* 21.7* 19.3*  PLT  --  312 301   BMET Recent Labs    08/22/24 1433 08/22/24 1440 08/23/24 0554  NA 147* 147* 143  K 3.9 4.1 3.3*  CL 114* 111 111  CO2 26  --  28  GLUCOSE 109* 109* 97  BUN 12 14 8   CREATININE 0.79 0.90 0.76  CALCIUM  8.7*  --  8.4*   LFT Recent Labs    08/22/24 1433  PROT 6.6  ALBUMIN 3.4*  AST 48*  ALT 35  ALKPHOS 148*  BILITOT 0.5   PT/INR No results for input(s): LABPROT,  INR in the last 72 hours.  Studies/Results: CT ABDOMEN PELVIS W CONTRAST Result Date: 08/22/2024 CLINICAL DATA:  Abdominal pain. EXAM: CT ABDOMEN AND PELVIS WITH CONTRAST TECHNIQUE: Multidetector CT imaging of the abdomen and pelvis was performed using the standard protocol following bolus administration of intravenous contrast. RADIATION DOSE REDUCTION: This exam was performed according to the departmental dose-optimization program which includes automated exposure control, adjustment of the mA and/or kV according to patient size and/or use of iterative reconstruction technique. CONTRAST:  80mL OMNIPAQUE  IOHEXOL  300 MG/ML  SOLN COMPARISON:  CT abdomen pelvis dated 12/03/2017. FINDINGS: Evaluation of this exam is limited due to respiratory motion. Lower chest: The visualized lung bases are clear. No intra-abdominal free air or free fluid. Hepatobiliary: Subcentimeter hepatic hypodense lesions are  too small to characterize. There is mild dilatation, post cholecystectomy. No retained calcified stone noted in the central CBD. Pancreas: Unremarkable. No pancreatic ductal dilatation or surrounding inflammatory changes. Spleen: Normal in size without focal abnormality. Adrenals/Urinary Tract: The adrenal glands are unremarkable. Small left renal cyst. There is no hydronephrosis on either side. There is symmetric enhancement and excretion of contrast by both kidneys. The visualized ureters and urinary bladder are unremarkable. Stomach/Bowel: There is diffuse colonic diverticulosis. There is no bowel obstruction or active inflammation. The appendix is normal. Vascular/Lymphatic: Mild aortoiliac atherosclerotic disease. The IVC is unremarkable. No portal venous gas. There is no adenopathy. Reproductive: Hysterectomy.  No suspicious adnexal masses. Other: None Musculoskeletal: Degenerative changes of the spine. No acute osseous pathology. Left gluteal lipoma. IMPRESSION: 1. No acute intra-abdominal or pelvic pathology. 2. Colonic diverticulosis. No bowel obstruction. Normal appendix. 3.  Aortic Atherosclerosis (ICD10-I70.0). Electronically Signed   By: Vanetta Chou M.D.   On: 08/22/2024 15:32    Impression: Rectal bleeding, hemodynamically stable, blood pressure 135/65, heart rate 84 CT abdomen pelvis with contrast 08/22/2024: Diffuse colonic diverticulosis, no acute intra-abdominal pelvic pathology  Lab Abnormalities: Hemoglobin 7.5 on presentation, sequently dropped to 6.5 and 5.9 Received 1 unit PRBC transfusion Hypokalemia, 3.3 AST 48, Alp 148,Low Albumin 3.4,TP 6.6  Plan: CT angio to determine if there is evidence of active bleeding, present recommend IR evaluation for embolization. Diverticular bleed resolved. H&H and plan to keep hemoglobin at least above 7. Will continue to follow.   LOS: 1 day   Estelita Manas, MD  08/23/2024, 10:19 AM

## 2024-08-23 NOTE — Progress Notes (Signed)
 Unable to complete admission questions. Pt non-verbal and uses mini-talk tablet that still limits communication. Pt brother at bedside and states that her 24/7 caregivers or Malaia Buchta (cousin) would be better informed to answer questions tomorrow.

## 2024-08-23 NOTE — Progress Notes (Signed)
 " PROGRESS NOTE  Meghan Hickman FMW:[id number] DOB: 1950/12/29   PCP: Joshua Debby CROME, MD  Patient is from: Home.  Lives alone.  Dependently ambulates at baseline.  Has caregiver.  DOA: 08/22/2024 LOS: 1  Chief complaints Chief Complaint  Patient presents with   GI Bleeding     Brief Narrative / Interim history: 74 year old F with PMH of Alzheimer's dementia, progressive aphasia, OSA, DM-2, HTN, GERD, nonalcoholic hepatic steatosis and internal hemorrhoids requiring banding in 2019 presenting with intermittent rectal bleed for the last 3 days that family describes as dark red blood, and admitted with acute blood loss anemia due to lower GI bleed.  In ED, stable vitals except for some soft BP.  Hgb 7.5 (was 12.1 in 12/2023).  CT abdomen and pelvis showed diverticulosis but no extravasation.  Sodium 147.  One units of blood ordered.  GI consulted.  Has not received blood until this morning due to positive antibodies.   Subjective: Seen and examined earlier this morning.  No major events overnight or this morning.  Has not received blood until this morning due to positive antibodies.  Patient with severe aphasia.  She is basically nonverbal.  She has communication device, and points at her picture and name when asked.  She does not appear to be in distress.   Assessment and plan: Acute blood loss anemia due to lower GI bleed: Presents with 3 days of rectal bleeding.  Not on antiplatelets or anticoagulations.  CT shows diverticulosis but no extravasation.  History of internal hemorrhoid s/p banding in 2019.  Blood transfusion delayed due to positive antibodies.  She just finished 1 unit this morning.  Just had bloody bowel movement later this morning.  Hemodynamically stable. Recent Labs    12/14/23 1634 08/22/24 1440 08/22/24 1557 08/23/24 0554  HGB 12.1 7.5* 6.5* 5.9*  - Follow CT angio GI bleed - Follow repeat CBC, and transfuse for Hgb <7.0. - GI on board. - Check anemia  panel - Moved to stepdown unit if signs of hemodynamic change  Severe dementia without behavioral disturbance Severe aphasia-she is basically nonverbal.  Uses communication device but she could only point out her name and picture -Reorientation and delirium precaution.   NIDDM-2: A1c 5.7% Recent Labs  Lab 08/23/24 0032 08/23/24 0435 08/23/24 0812 08/23/24 1120  GLUCAP 109* 125* 93 118*  - Monitor CBG - Decrease SSI to very sensitive while NPO  OSA: Not on CPAP.  Nonalcoholic hepatic steatosis -Outpatient follow-up.  Mood disorder: Stable - Continue home meds  Hypokalemia -Monitor replenish K and Mg as appropriate    There is no height or weight on file to calculate BMI.          DVT prophylaxis:  SCDs Start: 08/22/24 2012  Code Status: Full code. Family Communication: Updated caregiver at bedside, and Jon Simpler over the phone. Per Jon Cones is POA. 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  Level of care: Telemetry Status is: Inpatient Remains inpatient appropriate because: Acute blood loss anemia due to GI bleed   Final disposition: Home   55 minutes with more than 50% spent in reviewing records, counseling patient/family and coordinating care.  Consultants:  Gastroenterology  Procedures: None  Microbiology summarized: None  Objective: Vitals:   08/23/24 0610 08/23/24 0644 08/23/24 0820 08/23/24 0931  BP: 121/65 129/71 122/69 135/65  Pulse: 77 90 76 93  Resp: 10 12 17 15   Temp: (!) 97.4 F (36.3 C)  98.8 F (37.1 C) 98 F (36.7 C) 98.1 F (36.7 C)  TempSrc: Oral Oral Oral Oral  SpO2: 97% 98% 99% 99%    Examination:  GENERAL: No apparent distress.  Nontoxic. HEENT: MMM.  Vision and hearing grossly intact.  NECK: Supple.  No apparent JVD.  RESP:  No IWOB.  Fair aeration bilaterally. CVS:  RRR. Heart sounds normal.  ABD/GI/GU: BS+. Abd soft, NTND.  MSK/EXT:  Moves extremities. No apparent deformity.  No edema.  SKIN: no apparent skin lesion or wound NEURO: AA.  Oriented self.  Responds yes and no to questions using communication device.  No apparent focal neuro deficit. PSYCH: Calm. Normal affect.   Sch Meds:  Scheduled Meds:  sodium chloride    Intravenous Once   insulin  aspart  0-15 Units Subcutaneous Q4H   potassium chloride   60 mEq Oral Once   sodium chloride  flush  3 mL Intravenous Q12H   Continuous Infusions:  dextrose  5% lactated ringers  with KCl 20 mEq/L Stopped (08/23/24 0934)   lactated ringers  125 mL/hr at 08/23/24 0946   PRN Meds:.acetaminophen  **OR** acetaminophen , hydrALAZINE , morphine  injection, ondansetron  **OR** ondansetron  (ZOFRAN ) IV, oxyCODONE , traZODone   Antimicrobials: Anti-infectives (From admission, onward)    None        I have personally reviewed the following labs and images: CBC: Recent Labs  Lab 08/22/24 1440 08/22/24 1557 08/23/24 0554  WBC  --  14.2* 11.1*  NEUTROABS  --  9.4*  --   HGB 7.5* 6.5* 5.9*  HCT 22.0* 21.7* 19.3*  MCV  --  90.4 88.1  PLT  --  312 301   BMP &GFR Recent Labs  Lab 08/22/24 1433 08/22/24 1440 08/23/24 0554  NA 147* 147* 143  K 3.9 4.1 3.3*  CL 114* 111 111  CO2 26  --  28  GLUCOSE 109* 109* 97  BUN 12 14 8   CREATININE 0.79 0.90 0.76  CALCIUM  8.7*  --  8.4*   CrCl cannot be calculated (Unknown ideal weight.). Liver & Pancreas: Recent Labs  Lab 08/22/24 1433  AST 48*  ALT 35  ALKPHOS 148*  BILITOT 0.5  PROT 6.6  ALBUMIN 3.4*   No results for input(s): LIPASE, AMYLASE in the last 168 hours. No results for input(s): AMMONIA in the last 168 hours. Diabetic: Recent Labs    08/23/24 0554  HGBA1C 5.7*   Recent Labs  Lab 08/23/24 0032 08/23/24 0435 08/23/24 0812 08/23/24 1120  GLUCAP 109* 125* 93 118*   Cardiac Enzymes: No results for input(s): CKTOTAL, CKMB, CKMBINDEX, TROPONINI in the last 168 hours. No results for input(s): PROBNP in the last 8760  hours. Coagulation Profile: No results for input(s): INR, PROTIME in the last 168 hours. Thyroid  Function Tests: No results for input(s): TSH, T4TOTAL, FREET4, T3FREE, THYROIDAB in the last 72 hours. Lipid Profile: No results for input(s): CHOL, HDL, LDLCALC, TRIG, CHOLHDL, LDLDIRECT in the last 72 hours. Anemia Panel: No results for input(s): VITAMINB12, FOLATE, FERRITIN, TIBC, IRON , RETICCTPCT in the last 72 hours. Urine analysis:    Component Value Date/Time   COLORURINE YELLOW 08/09/2023 1026   APPEARANCEUR Sl Cloudy (A) 08/09/2023 1026   LABSPEC >=1.030 (A) 08/09/2023 1026   PHURINE 5.5 08/09/2023 1026   GLUCOSEU >=1000 (A) 08/09/2023 1026   HGBUR NEGATIVE 08/09/2023 1026   BILIRUBINUR SMALL (A) 08/09/2023 1026   BILIRUBINUR small (A) 12/02/2017 1736   BILIRUBINUR neg 04/24/2015 1151   KETONESUR NEGATIVE 08/09/2023 1026   PROTEINUR trace (A) 12/02/2017 1736   PROTEINUR NEGATIVE  11/30/2015 1303   UROBILINOGEN 0.2 08/09/2023 1026   NITRITE NEGATIVE 08/09/2023 1026   LEUKOCYTESUR NEGATIVE 08/09/2023 1026   Sepsis Labs: Invalid input(s): PROCALCITONIN, LACTICIDVEN  Microbiology: No results found for this or any previous visit (from the past 240 hours).  Radiology Studies: CT ABDOMEN PELVIS W CONTRAST Result Date: 08/22/2024 CLINICAL DATA:  Abdominal pain. EXAM: CT ABDOMEN AND PELVIS WITH CONTRAST TECHNIQUE: Multidetector CT imaging of the abdomen and pelvis was performed using the standard protocol following bolus administration of intravenous contrast. RADIATION DOSE REDUCTION: This exam was performed according to the departmental dose-optimization program which includes automated exposure control, adjustment of the mA and/or kV according to patient size and/or use of iterative reconstruction technique. CONTRAST:  80mL OMNIPAQUE  IOHEXOL  300 MG/ML  SOLN COMPARISON:  CT abdomen pelvis dated 12/03/2017. FINDINGS: Evaluation of this exam is  limited due to respiratory motion. Lower chest: The visualized lung bases are clear. No intra-abdominal free air or free fluid. Hepatobiliary: Subcentimeter hepatic hypodense lesions are too small to characterize. There is mild dilatation, post cholecystectomy. No retained calcified stone noted in the central CBD. Pancreas: Unremarkable. No pancreatic ductal dilatation or surrounding inflammatory changes. Spleen: Normal in size without focal abnormality. Adrenals/Urinary Tract: The adrenal glands are unremarkable. Small left renal cyst. There is no hydronephrosis on either side. There is symmetric enhancement and excretion of contrast by both kidneys. The visualized ureters and urinary bladder are unremarkable. Stomach/Bowel: There is diffuse colonic diverticulosis. There is no bowel obstruction or active inflammation. The appendix is normal. Vascular/Lymphatic: Mild aortoiliac atherosclerotic disease. The IVC is unremarkable. No portal venous gas. There is no adenopathy. Reproductive: Hysterectomy.  No suspicious adnexal masses. Other: None Musculoskeletal: Degenerative changes of the spine. No acute osseous pathology. Left gluteal lipoma. IMPRESSION: 1. No acute intra-abdominal or pelvic pathology. 2. Colonic diverticulosis. No bowel obstruction. Normal appendix. 3.  Aortic Atherosclerosis (ICD10-I70.0). Electronically Signed   By: Vanetta Chou M.D.   On: 08/22/2024 15:32      Pebbles Zeiders T. Aiyah Scarpelli [facility]  If 7PM-7AM, please contact night-coverage [email or web address] 08/23/2024, 11:53 AM   "

## 2024-08-23 NOTE — Plan of Care (Signed)

## 2024-08-24 ENCOUNTER — Telehealth (HOSPITAL_COMMUNITY): Payer: Self-pay | Admitting: Pharmacy Technician

## 2024-08-24 ENCOUNTER — Telehealth (HOSPITAL_COMMUNITY): Payer: Self-pay | Admitting: Pharmacist

## 2024-08-24 DIAGNOSIS — E118 Type 2 diabetes mellitus with unspecified complications: Secondary | ICD-10-CM | POA: Diagnosis not present

## 2024-08-24 DIAGNOSIS — K922 Gastrointestinal hemorrhage, unspecified: Secondary | ICD-10-CM | POA: Diagnosis not present

## 2024-08-24 DIAGNOSIS — K648 Other hemorrhoids: Secondary | ICD-10-CM | POA: Diagnosis not present

## 2024-08-24 DIAGNOSIS — G4733 Obstructive sleep apnea (adult) (pediatric): Secondary | ICD-10-CM | POA: Diagnosis not present

## 2024-08-24 DIAGNOSIS — D509 Iron deficiency anemia, unspecified: Secondary | ICD-10-CM | POA: Insufficient documentation

## 2024-08-24 DIAGNOSIS — D62 Acute posthemorrhagic anemia: Secondary | ICD-10-CM | POA: Diagnosis not present

## 2024-08-24 DIAGNOSIS — K7581 Nonalcoholic steatohepatitis (NASH): Secondary | ICD-10-CM | POA: Diagnosis not present

## 2024-08-24 DIAGNOSIS — G3101 Pick's disease: Secondary | ICD-10-CM | POA: Diagnosis not present

## 2024-08-24 DIAGNOSIS — F0280 Dementia in other diseases classified elsewhere without behavioral disturbance: Secondary | ICD-10-CM | POA: Diagnosis not present

## 2024-08-24 LAB — GLUCOSE, CAPILLARY
Glucose-Capillary: 123 mg/dL — ABNORMAL HIGH (ref 70–99)
Glucose-Capillary: 103 mg/dL — ABNORMAL HIGH (ref 70–99)
Glucose-Capillary: 100 mg/dL — ABNORMAL HIGH (ref 70–99)
Glucose-Capillary: 160 mg/dL — ABNORMAL HIGH (ref 70–99)
Glucose-Capillary: 119 mg/dL — ABNORMAL HIGH (ref 70–99)

## 2024-08-24 LAB — CBC
HCT: 24.8 % — ABNORMAL LOW (ref 36.0–46.0)
Hemoglobin: 8 g/dL — ABNORMAL LOW (ref 12.0–15.0)
MCH: 28.1 pg (ref 26.0–34.0)
MCHC: 32.3 g/dL (ref 30.0–36.0)
MCV: 87 fL (ref 80.0–100.0)
Platelets: 318 K/uL (ref 150–400)
RBC: 2.85 MIL/uL — ABNORMAL LOW (ref 3.87–5.11)
RDW: 14.9 % (ref 11.5–15.5)
WBC: 14.3 K/uL — ABNORMAL HIGH (ref 4.0–10.5)
nRBC: 0 % (ref 0.0–0.2)

## 2024-08-24 LAB — RENAL FUNCTION PANEL
Albumin: 3.5 g/dL (ref 3.5–5.0)
Anion gap: 10 (ref 5–15)
BUN: 8 mg/dL (ref 8–23)
CO2: 23 mmol/L (ref 22–32)
Calcium: 8.7 mg/dL — ABNORMAL LOW (ref 8.9–10.3)
Chloride: 110 mmol/L (ref 98–111)
Creatinine, Ser: 0.71 mg/dL (ref 0.44–1.00)
GFR, Estimated: >60 mL/min
Glucose, Bld: 123 mg/dL — ABNORMAL HIGH (ref 70–99)
Phosphorus: 3 mg/dL (ref 2.5–4.6)
Potassium: 4.1 mmol/L (ref 3.5–5.1)
Sodium: 144 mmol/L (ref 135–145)

## 2024-08-24 LAB — HEMOGLOBIN AND HEMATOCRIT, BLOOD
HCT: 28.6 % — ABNORMAL LOW (ref 36.0–46.0)
HCT: 25.8 % — ABNORMAL LOW (ref 36.0–46.0)
Hemoglobin: 9 g/dL — ABNORMAL LOW (ref 12.0–15.0)
Hemoglobin: 8.1 g/dL — ABNORMAL LOW (ref 12.0–15.0)

## 2024-08-24 LAB — MAGNESIUM: Magnesium: 2.1 mg/dL (ref 1.7–2.4)

## 2024-08-24 MED ORDER — MEMANTINE HCL 10 MG PO TABS
10.0000 mg | ORAL_TABLET | Freq: Two times a day (BID) | ORAL | Status: DC
  Administered 2024-08-24 – 2024-08-25 (×4): 10 mg via ORAL
  Filled 2024-08-24 (×4): qty 1

## 2024-08-24 MED ORDER — INSULIN ASPART 100 UNIT/ML IJ SOLN
0.0000 [IU] | Freq: Three times a day (TID) | INTRAMUSCULAR | Status: DC
  Administered 2024-08-24: 2 [IU] via SUBCUTANEOUS
  Administered 2024-08-25: 1 [IU] via SUBCUTANEOUS
  Filled 2024-08-24: qty 2

## 2024-08-24 MED ORDER — SERTRALINE HCL 25 MG PO TABS
25.0000 mg | ORAL_TABLET | Freq: Every morning | ORAL | Status: DC
  Administered 2024-08-24 – 2024-08-25 (×2): 25 mg via ORAL
  Filled 2024-08-24 (×2): qty 1

## 2024-08-24 MED ORDER — ATORVASTATIN CALCIUM 10 MG PO TABS
20.0000 mg | ORAL_TABLET | Freq: Every day | ORAL | Status: DC
  Administered 2024-08-24 – 2024-08-25 (×2): 20 mg via ORAL
  Filled 2024-08-24 (×2): qty 2

## 2024-08-24 NOTE — Evaluation (Signed)
 Physical Therapy Evaluation Patient Details Name: Meghan Hickman MRN: [id number] DOB: 1950-11-25 Today's Date: 08/24/2024  History of Present Illness  74 year old female admitted on 08/22/24 with acute blood loss anemia due to lower GI bleed. PMHx of Alzheimer's dementia, progressive aphasia, OSA, DM-2, HTN, GERD, nonalcoholic hepatic steatosis and internal hemorrhoids requiring banding in 2019  Clinical Impression  Pt admitted with above diagnosis.  Pt currently with functional limitations due to the deficits listed below (see PT Problem List). Pt will benefit from acute skilled PT to increase their independence and safety with mobility to allow discharge.  Pt requesting bathroom with communication device but points to [facility].  Pt able to safely transfer to/from [facility] and perform pericare and change brief when provided.  Pt ambulated in hallway and returned to bed.  Pt would like to continue to have assist with ambulating during admission and agreeable to HHPT upon d/c.          If plan is discharge home, recommend the following:     Can travel by private vehicle        Equipment Recommendations None recommended by PT  Recommendations for Other Services       Functional Status Assessment Patient has had a recent decline in their functional status and demonstrates the ability to make significant improvements in function in a reasonable and predictable amount of time.     Precautions / Restrictions Precautions Precautions: None;Other (comment) Precaution/Restrictions Comments: aphasia - nonverbal, uses communication device      Mobility  Bed Mobility Overal bed mobility: Modified Independent                  Transfers Overall transfer level: Needs assistance Equipment used: None Transfers: Sit to/from Stand, Bed to chair/wheelchair/BSC Sit to Stand: Contact guard assist, Supervision Stand pivot transfers: Contact guard assist, Supervision         General transfer  comment: CGA for safety    Ambulation/Gait Ambulation/Gait assistance: Contact guard assist, Supervision Gait Distance (Feet): 400 Feet Assistive device: None Gait Pattern/deviations: Step-through pattern, Decreased stride length       General Gait Details: slow but steady pace, did not report any symptoms when questioned  Stairs            Wheelchair Mobility     Tilt Bed    Modified Rankin (Stroke Patients Only)       Balance Overall balance assessment: No apparent balance deficits (not formally assessed)                                           Pertinent Vitals/Pain Pain Assessment Pain Assessment: Faces Faces Pain Scale: No hurt Pain Intervention(s): Monitored during session    Home Living Family/patient expects to be discharged to:: Private residence   Available Help at Discharge: Personal care attendant;Available 24 hours/day           Home Layout: One level Home Equipment: None      Prior Function Prior Level of Function : Needs assist             Mobility Comments: caregivers 24/7 to assist as needed with mobility and ADLs       Extremity/Trunk Assessment        Lower Extremity Assessment Lower Extremity Assessment: Generalized weakness    Cervical / Trunk Assessment Cervical / Trunk Assessment: Normal  Communication   Communication  Communication: Impaired Factors Affecting Communication: Difficulty expressing self    Cognition Arousal: Alert Behavior During Therapy: Flat affect                           PT - Cognition Comments: pt very flat, nonverbal, uses communication device, mostly yes/no questions utilized today Following commands: Intact       Cueing       General Comments      Exercises     Assessment/Plan    PT Assessment Patient needs continued PT services  PT Problem List Decreased strength;Decreased mobility;Decreased activity tolerance       PT Treatment  Interventions DME instruction;Gait training;Therapeutic exercise;Functional mobility training;Patient/family education;Therapeutic activities    PT Goals (Current goals can be found in the Care Plan section)  Acute Rehab PT Goals PT Goal Formulation: With patient Time For Goal Achievement: 09/07/24 Potential to Achieve Goals: Good    Frequency Min 2X/week     Co-evaluation               AM-PAC PT 6 Clicks Mobility  Outcome Measure Help needed turning from your back to your side while in a flat bed without using bedrails?: A Little Help needed moving from lying on your back to sitting on the side of a flat bed without using bedrails?: A Little Help needed moving to and from a bed to a chair (including a wheelchair)?: A Little Help needed standing up from a chair using your arms (e.g., wheelchair or bedside chair)?: A Little Help needed to walk in hospital room?: A Little Help needed climbing 3-5 steps with a railing? : A Little 6 Click Score: 18    End of Session Equipment Utilized During Treatment: Gait belt Activity Tolerance: Patient tolerated treatment well Patient left: in bed;with call bell/phone within reach;with family/visitor present   PT Visit Diagnosis: Difficulty in walking, not elsewhere classified (R26.2);Muscle weakness (generalized) (M62.81)    Time: 8472-8450 PT Time Calculation (min) (ACUTE ONLY): 22 min   Charges:   PT Evaluation $PT Eval Low Complexity: 1 Low   PT General Charges $$ ACUTE PT VISIT: 1 Visit        Tari PT, DPT Physical Therapist Acute Rehabilitation Services Office: [phone]   Kati L Payson 08/24/2024, 4:02 PM

## 2024-08-24 NOTE — Telephone Encounter (Signed)
 Auth Submission: NO AUTH NEEDED Site of care: [facility] Payer: MEDICARE A/B, BCBS FEP Medication & CPT/J Code(s) submitted: Feraheme (ferumoxytol ) [id number] Diagnosis Code: D50.9, D62 Route of submission (phone, fax, portal):  Phone # Fax # Auth type: Buy/Bill HB Units/visits requested: 510MG  X 1 DOSE Reference number:  Approval from: 08/24/2024 to 11/22/24    Dagoberto Armour, CPhT [facility] Phone: [phone] 08/24/2024

## 2024-08-24 NOTE — Progress Notes (Signed)
 " PROGRESS NOTE  Meghan Hickman FMW:[id number] DOB: [birth year]   PCP: Joshua Debby CROME, MD  Patient is from: Home.  Lives alone.  Dependently ambulates at baseline.  Has caregiver.  DOA: 08/22/2024 LOS: 2  Chief complaints Chief Complaint  Patient presents with   GI Bleeding     Brief Narrative / Interim history: 74 year old F with PMH of Alzheimer's dementia, progressive aphasia, OSA, DM-2, HTN, GERD, nonalcoholic hepatic steatosis and internal hemorrhoids requiring banding in 2019 presenting with intermittent rectal bleed for the last 3 days that family describes as dark red blood, and admitted with acute blood loss anemia due to lower GI bleed.  In ED, stable vitals except for some soft BP.  Hgb 7.5 (was 12.1 in 12/2023).  CT abdomen and pelvis showed diverticulosis but no extravasation.  Sodium 147.  One units of blood ordered.  GI consulted.  Hemoglobin reaching ideal at 5.9 but improved to 8.6 after 1 unit and remained stable.  She has some intermittent GI bleeds but repeat CT angio without extravasation.  GI resumed regular diet.  Plan for discharge on 1/17 if hemoglobin remained stable.   Subjective: Seen and examined earlier this morning.  No major events overnight or this morning.  No complaints but patient is basically nonverbal.  She is awake and alert but does not follow commands.  Assessment and plan: Acute blood loss anemia due to lower GI bleed: Presents with 3 days of rectal bleeding.  Not on antiplatelets or anticoagulations.  Initial and repeat CT angio without extravasation but showed diverticulosis.  History of internal hemorrhoid s/p banding in 2019.  Hgb reached nadir at 5.9 but improved to 8.6 after 1 unit.  She had some bleeding episodes on 1/15 but H&H remained stable.  Anemia panel with iron  deficiency. Recent Labs    12/14/23 1634 08/22/24 1440 08/22/24 1557 08/23/24 0554 08/23/24 1238 08/23/24 1754 08/23/24 2107 08/24/24 0139 08/24/24 0937  HGB 12.1  7.5* 6.5* 5.9* 8.6* 8.7* 9.0* 8.0* 9.0*  - IV Venofer  300 mg x 1.  Will place ambulatory referral for further IV iron . - Follow repeat CBC, and transfuse for Hgb <7.0. - Appreciate help by GI.   Severe dementia without behavioral disturbance Severe aphasia-she is basically nonverbal.  Sometimes uses communication device to point at her name. -Reorientation and delirium precaution.   NIDDM-2: A1c 5.7% Recent Labs  Lab 08/23/24 2119 08/23/24 2356 08/24/24 0514 08/24/24 0800 08/24/24 1143  GLUCAP 107* 124* 123* 103* 100*  - SSI-sensitive  OSA: Not on CPAP.  Nonalcoholic hepatic steatosis -Outpatient follow-up.  Mood disorder: Stable - Continue home meds  Hypokalemia -Monitor replenish K and Mg as appropriate  Leukocytosis: Likely demargination.  No suspicion for infection. - Monitor    There is no height or weight on file to calculate BMI.          DVT prophylaxis:  SCDs Start: 08/22/24 2012  Code Status: Full code. Family Communication: None at bedside. Level of care: Telemetry Status is: Inpatient Remains inpatient appropriate because: Acute blood loss anemia due to GI bleed   Final disposition: Home   55 minutes with more than 50% spent in reviewing records, counseling patient/family and coordinating care.  Consultants:  Gastroenterology  Procedures: None  Microbiology summarized: None  Objective: Vitals:   08/23/24 1426 08/23/24 1943 08/24/24 0517 08/24/24 1218  BP: (!) 143/110 122/67 (!) 135/59 125/76  Pulse: (!) 102 97 99 (!) 102  Resp: 16 19 19 18   Temp: 98.3 F (36.8  C) 97.9 F (36.6 C) 98.2 F (36.8 C) 98.2 F (36.8 C)  TempSrc: Oral Oral Oral   SpO2: 99% 98% 100% 100%    Examination:  GENERAL: No apparent distress.  Nontoxic. HEENT: MMM.  Vision and hearing grossly intact.  NECK: Supple.  No apparent JVD.  RESP:  No IWOB.  Fair aeration bilaterally. CVS:  RRR. Heart sounds normal.  ABD/GI/GU: BS+. Abd soft, NTND.   MSK/EXT:  Moves extremities. No apparent deformity. No edema.  SKIN: no apparent skin lesion or wound NEURO: AA.  Oriented self.  Does not follow commands.  No apparent focal neuro deficit. PSYCH: Calm. Normal affect.   Sch Meds:  Scheduled Meds:  sodium chloride    Intravenous Once   sodium chloride    Intravenous Once   atorvastatin   20 mg Oral Daily   insulin  aspart  0-6 Units Subcutaneous Q4H   memantine   10 mg Oral BID   sertraline   25 mg Oral q AM   sodium chloride  flush  3 mL Intravenous Q12H   Continuous Infusions:   PRN Meds:.acetaminophen  **OR** acetaminophen , hydrALAZINE , morphine  injection, ondansetron  **OR** ondansetron  (ZOFRAN ) IV, oxyCODONE , traZODone   Antimicrobials: Anti-infectives (From admission, onward)    None        I have personally reviewed the following labs and images: CBC: Recent Labs  Lab 08/22/24 1557 08/23/24 0554 08/23/24 1238 08/23/24 1754 08/23/24 2107 08/24/24 0139 08/24/24 0937  WBC 14.2* 11.1* 12.8*  --   --  14.3*  --   NEUTROABS 9.4*  --  8.8*  --   --   --   --   HGB 6.5* 5.9* 8.6* 8.7* 9.0* 8.0* 9.0*  HCT 21.7* 19.3* 27.2* 26.9* 28.1* 24.8* 28.6*  MCV 90.4 88.1 87.2  --   --  87.0  --   PLT 312 301 246  --   --  318  --    BMP &GFR Recent Labs  Lab 08/22/24 1433 08/22/24 1440 08/23/24 0554 08/24/24 0139  NA 147* 147* 143 144  K 3.9 4.1 3.3* 4.1  CL 114* 111 111 110  CO2 26  --  28 23  GLUCOSE 109* 109* 97 123*  BUN 12 14 8 8   CREATININE 0.79 0.90 0.76 0.71  CALCIUM  8.7*  --  8.4* 8.7*  MG  --   --   --  2.1  PHOS  --   --   --  3.0   CrCl cannot be calculated (Unknown ideal weight.). Liver & Pancreas: Recent Labs  Lab 08/22/24 1433 08/24/24 0139  AST 48*  --   ALT 35  --   ALKPHOS 148*  --   BILITOT 0.5  --   PROT 6.6  --   ALBUMIN 3.4* 3.5   No results for input(s): LIPASE, AMYLASE in the last 168 hours. No results for input(s): AMMONIA in the last 168 hours. Diabetic: Recent Labs     08/23/24 0554  HGBA1C 5.7*   Recent Labs  Lab 08/23/24 2119 08/23/24 2356 08/24/24 0514 08/24/24 0800 08/24/24 1143  GLUCAP 107* 124* 123* 103* 100*   Cardiac Enzymes: No results for input(s): CKTOTAL, CKMB, CKMBINDEX, TROPONINI in the last 168 hours. No results for input(s): PROBNP in the last 8760 hours. Coagulation Profile: No results for input(s): INR, PROTIME in the last 168 hours. Thyroid  Function Tests: No results for input(s): TSH, T4TOTAL, FREET4, T3FREE, THYROIDAB in the last 72 hours. Lipid Profile: No results for input(s): CHOL, HDL, LDLCALC, TRIG, CHOLHDL, LDLDIRECT in the last  72 hours. Anemia Panel: Recent Labs    08/23/24 1238 08/23/24 1523  VITAMINB12  --  778  FOLATE  --  >20.0  FERRITIN  --  53  TIBC  --  284  IRON   --  26*  RETICCTPCT 4.6*  --    Urine analysis:    Component Value Date/Time   COLORURINE YELLOW 08/09/2023 1026   APPEARANCEUR Sl Cloudy (A) 08/09/2023 1026   LABSPEC >=1.030 (A) 08/09/2023 1026   PHURINE 5.5 08/09/2023 1026   GLUCOSEU >=1000 (A) 08/09/2023 1026   HGBUR NEGATIVE 08/09/2023 1026   BILIRUBINUR SMALL (A) 08/09/2023 1026   BILIRUBINUR small (A) 12/02/2017 1736   BILIRUBINUR neg 04/24/2015 1151   KETONESUR NEGATIVE 08/09/2023 1026   PROTEINUR trace (A) 12/02/2017 1736   PROTEINUR NEGATIVE 11/30/2015 1303   UROBILINOGEN 0.2 08/09/2023 1026   NITRITE NEGATIVE 08/09/2023 1026   LEUKOCYTESUR NEGATIVE 08/09/2023 1026   Sepsis Labs: Invalid input(s): PROCALCITONIN, LACTICIDVEN  Microbiology: No results found for this or any previous visit (from the past 240 hours).  Radiology Studies: CT ANGIO GI BLEED Result Date: 08/23/2024 CLINICAL DATA:  Lower GI bleed. EXAM: CTA ABDOMEN AND PELVIS WITHOUT AND WITH CONTRAST TECHNIQUE: Multidetector CT imaging of the abdomen and pelvis was performed using the standard protocol during bolus administration of intravenous contrast. Multiplanar  reconstructed images and MIPs were obtained and reviewed to evaluate the vascular anatomy. RADIATION DOSE REDUCTION: This exam was performed according to the departmental dose-optimization program which includes automated exposure control, adjustment of the mA and/or kV according to patient size and/or use of iterative reconstruction technique. CONTRAST:  OMNIPAQUE  IOHEXOL  350 MG/ML SOLN COMPARISON:  CT abdomen pelvis dated 08/24/2023. FINDINGS: Evaluation of this exam is limited due to respiratory motion. VASCULAR Aorta: Mild aortoiliac atherosclerotic disease. No aneurysmal dilatation or dissection. No periaortic fluid collection. Celiac: Patent without evidence of aneurysm, dissection, vasculitis or significant stenosis. The SMA: Patent without evidence of aneurysm, dissection, vasculitis or significant stenosis. Renals: Both renal arteries are patent without evidence of aneurysm, dissection, vasculitis, fibromuscular dysplasia or significant stenosis. IMA: Patent without evidence of aneurysm, dissection, vasculitis or significant stenosis. Inflow: Mild atherosclerotic calcification. No abnormal dilatation or dissection. The iliac arteries are patent. Proximal Outflow: The visualized proximal pleural is patent. Veins: The IVC is unremarkable.  No portal venous gas. Review of the MIP images confirms the above findings. NON-VASCULAR Lower chest: The visualized lung bases are clear. No intra-abdominal free air or free fluid. Hepatobiliary: Subcentimeter hypodense focus in the posterior liver is too small to characterize. There is mild dilatation, post cholecystectomy. No retained calcified stone noted in the central CBD. Pancreas: Unremarkable. No pancreatic ductal dilatation or surrounding inflammatory changes. Spleen: Normal in size without focal abnormality. Adrenals/Urinary Tract: The adrenal glands are unremarkable. Small left renal cyst. There is no hydronephrosis on the physis on either side. The  visualized ureters and urinary bladder appear unremarkable. Stomach/Bowel: There is colonic diverticulosis. Mild thickened appearance of the transverse colon may be related to underdistention or represent mild colitis. There is no bowel obstruction. The appendix is normal. Evaluation for active GI bleed is very limited due to presence of oral contrast. No definite active GI bleed identified. Lymphatic: No adenopathy. Reproductive: Hysterectomy. Other: None Musculoskeletal: Osteopenia with scoliosis and degenerative changes. No acute osseous pathology. IMPRESSION: 1. No evidence of active GI bleed. 2. Underdistention of the proximal transverse colon versus mild colitis. No bowel obstruction. 3. Colonic diverticulosis. 4.  Aortic Atherosclerosis (ICD10-I70.0). Electronically Signed  By: Vanetta Chou M.D.   On: 08/23/2024 15:12      Riyana Biel T. Felina Tello [facility]  If 7PM-7AM, please contact night-coverage [email or web address] 08/24/2024, 1:18 PM   "

## 2024-08-24 NOTE — Telephone Encounter (Signed)
 Patient referred to infusion pharmacy team for ambulatory infusion of IV iron .  Insurance - Medicare A/B Site of care - Site of care: [facility] Dx code - D62 IV Iron  Therapy - Feraheme 510mg  x 1. Venofer  300mg  x 1 received inpatient on 08/23/2024 Infusion appointments - Scheduling team will schedule patient as soon as possible.    Sherry Pennant, PharmD, MPH, BCPS, CPP Clinical Pharmacist

## 2024-08-24 NOTE — Progress Notes (Signed)
 Subjective: Aphasic, uses a writing pad for communication. Denies abdominal pain. As per nurse, last bowel movement was last evening described as dark red blood.  Objective: Vital signs in last 24 hours: Temp:  [97.9 F (36.6 C)-98.3 F (36.8 C)] 98.2 F (36.8 C) (01/16 0517) Pulse Rate:  [97-102] 99 (01/16 0517) Resp:  [16-19] 19 (01/16 0517) BP: (122-143)/(59-110) 135/59 (01/16 0517) SpO2:  [98 %-100 %] 100 % (01/16 0517) Weight change:  Last BM Date : 08/24/24  PE: Aphasic GENERAL: Not in distress, mild pallor  ABDOMEN: Soft, nondistended, nontender EXTREMITIES: No deformity  Lab Results: Results for orders placed or performed during the hospital encounter of 08/22/24 (from the past 48 hours)  Comprehensive metabolic panel with GFR     Status: Abnormal   Collection Time: 08/22/24  2:33 PM  Result Value Ref Range   Sodium 147 (H) 135 - 145 mmol/L   Potassium 3.9 3.5 - 5.1 mmol/L   Chloride 114 (H) 98 - 111 mmol/L   CO2 26 22 - 32 mmol/L   Glucose, Bld 109 (H) 70 - 99 mg/dL    Comment: Glucose reference range applies only to samples taken after fasting for at least 8 hours.   BUN 12 8 - 23 mg/dL   Creatinine, Ser 9.20 0.44 - 1.00 mg/dL   Calcium  8.7 (L) 8.9 - 10.3 mg/dL   Total Protein 6.6 6.5 - 8.1 g/dL   Albumin 3.4 (L) 3.5 - 5.0 g/dL   AST 48 (H) 15 - 41 U/L   ALT 35 0 - 44 U/L   Alkaline Phosphatase 148 (H) 38 - 126 U/L   Total Bilirubin 0.5 0.0 - 1.2 mg/dL   GFR, Estimated >39 >39 mL/min    Comment: (NOTE) Calculated using the CKD-EPI Creatinine Equation (2021)    Anion gap 7 5 - 15    Comment: Performed at [facility], [address] [place], [place] [address]  Type and screen     Status: None (Preliminary result)   Collection Time: 08/22/24  2:33 PM  Result Value Ref Range   ABO/RH(D) A POS    Antibody Screen POS    Sample Expiration 08/25/2024,2359    DAT, IgG POS    Antibody Identification      WARM AUTOANTIBODY SAMPLE SENT TO  [facility] TO CONFIRM WARM AUTOANTIBODY   PT AG Type      POSITIVE FOR C ANTIGEN POSITIVE FOR c ANTIGEN NEGATIVE FOR E ANTIGEN POSITIVE FOR e ANTIGEN NEGATIVE FOR KELL ANTIGEN POSITIVE FOR KIDD A ANTIGEN POSITIVE FOR KIDD B ANTIGEN NEGATIVE FOR DUFFY A ANTIGEN NEGATIVE FOR DUFFY B ANTIGEN POSITIVE FOR M ANTIGEN  POSITIVE FOR N ANTIGEN POSITIVE FOR S ANTIGEN POSITIVE FOR s ANTIGEN    Antibody ID,T [place]      WARM AUTOANTIBODY PERFORMED AT [facility] ON 01.14.26   Unit Number [id number]    Blood Component Type RED CELLS,LR    Unit division 00    Status of Unit ISSUED,FINAL    Transfusion Status OK TO TRANSFUSE    Crossmatch Result COMPATIBLE    Unit Number [id number]    Blood Component Type RED CELLS,LR    Unit division 00    Status of Unit ALLOCATED    Transfusion Status OK TO TRANSFUSE    Crossmatch Result COMPATIBLE    Unit Number [id number]    Blood Component Type RED CELLS,LR    Unit division 00    Status of Unit ALLOCATED    Transfusion Status  OK TO TRANSFUSE    Crossmatch Result COMPATIBLE    Unit Number [id number]    Blood Component Type RED CELLS,LR    Unit division 00    Status of Unit ALLOCATED    Transfusion Status OK TO TRANSFUSE    Crossmatch Result COMPATIBLE   I-stat chem 8, ed     Status: Abnormal   Collection Time: 08/22/24  2:40 PM  Result Value Ref Range   Sodium 147 (H) 135 - 145 mmol/L   Potassium 4.1 3.5 - 5.1 mmol/L   Chloride 111 98 - 111 mmol/L   BUN 14 8 - 23 mg/dL   Creatinine, Ser 9.09 0.44 - 1.00 mg/dL   Glucose, Bld 890 (H) 70 - 99 mg/dL    Comment: Glucose reference range applies only to samples taken after fasting for at least 8 hours.   Calcium , Ion 1.16 1.15 - 1.40 mmol/L   TCO2 26 22 - 32 mmol/L   Hemoglobin 7.5 (L) 12.0 - 15.0 g/dL   HCT 77.9 (L) 63.9 - 53.9 %  CBC with Differential/Platelet     Status: Abnormal   Collection Time: 08/22/24  3:57 PM  Result Value Ref Range   WBC 14.2 (H) 4.0 - 10.5 K/uL   RBC 2.40 (L) 3.87 -  5.11 MIL/uL   Hemoglobin 6.5 (LL) 12.0 - 15.0 g/dL    Comment: REPEATED TO VERIFY This critical result has been called to C.BERRIER, RN by Rankin Hummer on 08/22/2024 16:35:56, and has been read back. CRITICAL RESULT VERIFIED.    HCT 21.7 (L) 36.0 - 46.0 %   MCV 90.4 80.0 - 100.0 fL   MCH 27.1 26.0 - 34.0 pg   MCHC 30.0 30.0 - 36.0 g/dL   RDW 85.3 88.4 - 84.4 %   Platelets 312 150 - 400 K/uL   nRBC 0.0 0.0 - 0.2 %   Neutrophils Relative % 65 %   Neutro Abs 9.4 (H) 1.7 - 7.7 K/uL   Lymphocytes Relative 28 %   Lymphs Abs 3.9 0.7 - 4.0 K/uL   Monocytes Relative 4 %   Monocytes Absolute 0.6 0.1 - 1.0 K/uL   Eosinophils Relative 2 %   Eosinophils Absolute 0.3 0.0 - 0.5 K/uL   Basophils Relative 0 %   Basophils Absolute 0.1 0.0 - 0.1 K/uL   Immature Granulocytes 1 %   Abs Immature Granulocytes 0.07 0.00 - 0.07 K/uL    Comment: Performed at [facility], [address] [place], [place] [address]  ABO/Rh     Status: None   Collection Time: 08/22/24  3:57 PM  Result Value Ref Range   ABO/RH(D)      A POS Performed at [facility], [address], [place], [place] [address]   Glucose, capillary     Status: Abnormal   Collection Time: 08/23/24 12:32 AM  Result Value Ref Range   Glucose-Capillary 109 (H) 70 - 99 mg/dL    Comment: Glucose reference range applies only to samples taken after fasting for at least 8 hours.   Comment 1 Notify RN   Prepare RBC (crossmatch)     Status: None   Collection Time: 08/23/24 12:49 AM  Result Value Ref Range   Order Confirmation      ORDER PROCESSED BY BLOOD BANK Performed at [facility], [address], [place], [place] [address]   Glucose, capillary     Status: Abnormal   Collection Time: 08/23/24  4:35 AM  Result  Value Ref Range   Glucose-Capillary 125 (H) 70 - 99 mg/dL    Comment: Glucose reference range applies only to samples taken after fasting for at least 8 hours.   Comment 1  Notify RN   Hemoglobin A1c     Status: Abnormal   Collection Time: 08/23/24  5:54 AM  Result Value Ref Range   Hgb A1c MFr Bld 5.7 (H) 4.8 - 5.6 %    Comment: (NOTE) Diagnosis of Diabetes The following HbA1c ranges recommended by the American Diabetes Association (ADA) may be used as an aid in the diagnosis of diabetes mellitus.  Hemoglobin             Suggested A1C NGSP%              Diagnosis  <5.7                   Non Diabetic  5.7-6.4                Pre-Diabetic  >6.4                   Diabetic  <7.0                   Glycemic control for                       adults with diabetes.     Mean Plasma Glucose 116.89 mg/dL    Comment: Performed at [facility], [address] [place], [place] [address]  Basic metabolic panel     Status: Abnormal   Collection Time: 08/23/24  5:54 AM  Result Value Ref Range   Sodium 143 135 - 145 mmol/L   Potassium 3.3 (L) 3.5 - 5.1 mmol/L   Chloride 111 98 - 111 mmol/L   CO2 28 22 - 32 mmol/L   Glucose, Bld 97 70 - 99 mg/dL    Comment: Glucose reference range applies only to samples taken after fasting for at least 8 hours.   BUN 8 8 - 23 mg/dL   Creatinine, Ser 9.23 0.44 - 1.00 mg/dL   Calcium  8.4 (L) 8.9 - 10.3 mg/dL   GFR, Estimated >39 >39 mL/min    Comment: (NOTE) Calculated using the CKD-EPI Creatinine Equation (2021)    Anion gap 5 5 - 15    Comment: Performed at [facility], [address], [place], [place] [address]  CBC     Status: Abnormal   Collection Time: 08/23/24  5:54 AM  Result Value Ref Range   WBC 11.1 (H) 4.0 - 10.5 K/uL   RBC 2.19 (L) 3.87 - 5.11 MIL/uL   Hemoglobin 5.9 (LL) 12.0 - 15.0 g/dL    Comment: REPEATED TO VERIFY This critical result has been called to RORY DEL, RN by [phone] on 08/23/2024 08:09:32, and has been read back.    HCT 19.3 (L) 36.0 - 46.0 %   MCV 88.1 80.0 - 100.0 fL   MCH 26.9 26.0 - 34.0 pg   MCHC 30.6 30.0 - 36.0 g/dL   RDW 85.2 88.4 - 84.4 %   Platelets 301  150 - 400 K/uL   nRBC 0.0 0.0 - 0.2 %    Comment: Performed at [facility], [address], [place], [place] [address]  Glucose, capillary     Status: None   Collection Time: 08/23/24  8:12 AM  Result Value Ref Range  Glucose-Capillary 93 70 - 99 mg/dL    Comment: Glucose reference range applies only to samples taken after fasting for at least 8 hours.   Comment 1 Notify RN    Comment 2 Document in Chart   Glucose, capillary     Status: Abnormal   Collection Time: 08/23/24 11:20 AM  Result Value Ref Range   Glucose-Capillary 118 (H) 70 - 99 mg/dL    Comment: Glucose reference range applies only to samples taken after fasting for at least 8 hours.   Comment 1 Notify RN    Comment 2 Document in Chart   Prepare RBC (crossmatch)     Status: None   Collection Time: 08/23/24 11:58 AM  Result Value Ref Range   Order Confirmation      ORDER PROCESSED BY BLOOD BANK Performed at [facility] [place] [facility], [address], [place], [place] [address]   CBC with Differential/Platelet     Status: Abnormal   Collection Time: 08/23/24 12:38 PM  Result Value Ref Range   WBC 12.8 (H) 4.0 - 10.5 K/uL   RBC 3.12 (L) 3.87 - 5.11 MIL/uL   Hemoglobin 8.6 (L) 12.0 - 15.0 g/dL    Comment: POST TRANSFUSION SPECIMEN   HCT 27.2 (L) 36.0 - 46.0 %   MCV 87.2 80.0 - 100.0 fL   MCH 27.6 26.0 - 34.0 pg   MCHC 31.6 30.0 - 36.0 g/dL   RDW 85.1 88.4 - 84.4 %   Platelets 246 150 - 400 K/uL   nRBC 0.0 0.0 - 0.2 %   Neutrophils Relative % 68 %   Neutro Abs 8.8 (H) 1.7 - 7.7 K/uL   Lymphocytes Relative 21 %   Lymphs Abs 2.7 0.7 - 4.0 K/uL   Monocytes Relative 6 %   Monocytes Absolute 0.7 0.1 - 1.0 K/uL   Eosinophils Relative 4 %   Eosinophils Absolute 0.5 0.0 - 0.5 K/uL   Basophils Relative 0 %   Basophils Absolute 0.0 0.0 - 0.1 K/uL   WBC Morphology MORPHOLOGY UNREMARKABLE    RBC Morphology MORPHOLOGY UNREMARKABLE    Smear Review Normal platelet morphology    Immature  Granulocytes 1 %   Abs Immature Granulocytes 0.08 (H) 0.00 - 0.07 K/uL    Comment: Performed at [facility], [address], [place], [place] [address]  Reticulocytes     Status: Abnormal   Collection Time: 08/23/24 12:38 PM  Result Value Ref Range   Retic Ct Pct 4.6 (H) 0.4 - 3.1 %   RBC. 3.19 (L) 3.87 - 5.11 MIL/uL   Retic Count, Absolute 145.1 19.0 - 186.0 K/uL   Immature Retic Fract 32.3 (H) 2.3 - 15.9 %    Comment: Performed at [facility], [address] [place], [place] [address]  Vitamin B12     Status: None   Collection Time: 08/23/24  3:23 PM  Result Value Ref Range   Vitamin B-12 778 180 - 914 pg/mL    Comment: Performed at [facility], [address], [place], [place] [address]  Folate     Status: None   Collection Time: 08/23/24  3:23 PM  Result Value Ref Range   Folate >20.0 >5.9 ng/mL    Comment: Performed at [facility], [address] [place], [place] [address]  Iron  and TIBC     Status: Abnormal   Collection Time: 08/23/24  3:23 PM  Result Value Ref Range   Iron  26 (L) 28 -  170 ug/dL   TIBC 715 749 - 549 ug/dL   Saturation Ratios 9 (L) 10.4 - 31.8 %   UIBC 259 ug/dL    Comment: Performed at [facility], [address] [place], [place] [address]  Ferritin     Status: None   Collection Time: 08/23/24  3:23 PM  Result Value Ref Range   Ferritin 53 11 - 307 ng/mL    Comment: Performed at [facility], [address] [place], [place] [address]  Glucose, capillary     Status: None   Collection Time: 08/23/24  4:55 PM  Result Value Ref Range   Glucose-Capillary 84 70 - 99 mg/dL    Comment: Glucose reference range applies only to samples taken after fasting for at least 8 hours.   Comment 1 Notify RN    Comment 2 Document in Chart   Hemoglobin and hematocrit, blood     Status: Abnormal   Collection Time: 08/23/24  5:54 PM  Result Value Ref  Range   Hemoglobin 8.7 (L) 12.0 - 15.0 g/dL   HCT 73.0 (L) 63.9 - 53.9 %    Comment: Performed at [facility], [address], [place], [place] [address]  Hemoglobin and hematocrit, blood     Status: Abnormal   Collection Time: 08/23/24  9:07 PM  Result Value Ref Range   Hemoglobin 9.0 (L) 12.0 - 15.0 g/dL   HCT 71.8 (L) 63.9 - 53.9 %    Comment: Performed at [facility], [address], [place], [place] [address]  Glucose, capillary     Status: Abnormal   Collection Time: 08/23/24  9:19 PM  Result Value Ref Range   Glucose-Capillary 107 (H) 70 - 99 mg/dL    Comment: Glucose reference range applies only to samples taken after fasting for at least 8 hours.   Comment 1 Notify RN   Glucose, capillary     Status: Abnormal   Collection Time: 08/23/24 11:56 PM  Result Value Ref Range   Glucose-Capillary 124 (H) 70 - 99 mg/dL    Comment: Glucose reference range applies only to samples taken after fasting for at least 8 hours.   Comment 1 Notify RN   Renal function panel     Status: Abnormal   Collection Time: 08/24/24  1:39 AM  Result Value Ref Range   Sodium 144 135 - 145 mmol/L   Potassium 4.1 3.5 - 5.1 mmol/L    Comment: Delta check noted    Chloride 110 98 - 111 mmol/L   CO2 23 22 - 32 mmol/L   Glucose, Bld 123 (H) 70 - 99 mg/dL    Comment: Glucose reference range applies only to samples taken after fasting for at least 8 hours.   BUN 8 8 - 23 mg/dL   Creatinine, Ser 9.28 0.44 - 1.00 mg/dL   Calcium  8.7 (L) 8.9 - 10.3 mg/dL   Phosphorus 3.0 2.5 - 4.6 mg/dL   Albumin 3.5 3.5 - 5.0 g/dL   GFR, Estimated >39 >39 mL/min    Comment: (NOTE) Calculated using the CKD-EPI Creatinine Equation (2021)    Anion gap 10 5 - 15    Comment: Performed at [facility], [address], [place], [place] [address]  Magnesium     Status: None   Collection Time: 08/24/24  1:39 AM  Result Value Ref Range   Magnesium 2.1 1.7 - 2.4 mg/dL     Comment: Performed at [facility], [address] [place], [place] [address]  CBC     Status: Abnormal   Collection Time: 08/24/24  1:39 AM  Result Value Ref Range   WBC 14.3 (H) 4.0 - 10.5 K/uL   RBC 2.85 (L) 3.87 - 5.11 MIL/uL   Hemoglobin 8.0 (L) 12.0 - 15.0 g/dL   HCT 75.1 (L) 63.9 - 53.9 %   MCV 87.0 80.0 - 100.0 fL   MCH 28.1 26.0 - 34.0 pg   MCHC 32.3 30.0 - 36.0 g/dL   RDW 85.0 88.4 - 84.4 %   Platelets 318 150 - 400 K/uL   nRBC 0.0 0.0 - 0.2 %    Comment: Performed at [facility], [address], [place], [place] [address]  Glucose, capillary     Status: Abnormal   Collection Time: 08/24/24  5:14 AM  Result Value Ref Range   Glucose-Capillary 123 (H) 70 - 99 mg/dL    Comment: Glucose reference range applies only to samples taken after fasting for at least 8 hours.   Comment 1 Notify RN   Glucose, capillary     Status: Abnormal   Collection Time: 08/24/24  8:00 AM  Result Value Ref Range   Glucose-Capillary 103 (H) 70 - 99 mg/dL    Comment: Glucose reference range applies only to samples taken after fasting for at least 8 hours.  Hemoglobin and hematocrit, blood     Status: Abnormal   Collection Time: 08/24/24  9:37 AM  Result Value Ref Range   Hemoglobin 9.0 (L) 12.0 - 15.0 g/dL   HCT 71.3 (L) 63.9 - 53.9 %    Comment: Performed at [facility], [address] [place], [place] [address]    Studies/Results: CT ANGIO GI BLEED Result Date: 08/23/2024 CLINICAL DATA:  Lower GI bleed. EXAM: CTA ABDOMEN AND PELVIS WITHOUT AND WITH CONTRAST TECHNIQUE: Multidetector CT imaging of the abdomen and pelvis was performed using the standard protocol during bolus administration of intravenous contrast. Multiplanar reconstructed images and MIPs were obtained and reviewed to evaluate the vascular anatomy. RADIATION DOSE REDUCTION: This exam was performed according to the departmental dose-optimization program which includes  automated exposure control, adjustment of the mA and/or kV according to patient size and/or use of iterative reconstruction technique. CONTRAST:  OMNIPAQUE  IOHEXOL  350 MG/ML SOLN COMPARISON:  CT abdomen pelvis dated 08/24/2023. FINDINGS: Evaluation of this exam is limited due to respiratory motion. VASCULAR Aorta: Mild aortoiliac atherosclerotic disease. No aneurysmal dilatation or dissection. No periaortic fluid collection. Celiac: Patent without evidence of aneurysm, dissection, vasculitis or significant stenosis. The SMA: Patent without evidence of aneurysm, dissection, vasculitis or significant stenosis. Renals: Both renal arteries are patent without evidence of aneurysm, dissection, vasculitis, fibromuscular dysplasia or significant stenosis. IMA: Patent without evidence of aneurysm, dissection, vasculitis or significant stenosis. Inflow: Mild atherosclerotic calcification. No abnormal dilatation or dissection. The iliac arteries are patent. Proximal Outflow: The visualized proximal pleural is patent. Veins: The IVC is unremarkable.  No portal venous gas. Review of the MIP images confirms the above findings. NON-VASCULAR Lower chest: The visualized lung bases are clear. No intra-abdominal free air or free fluid. Hepatobiliary: Subcentimeter hypodense focus in the posterior liver is too small to characterize. There is mild dilatation, post cholecystectomy. No retained calcified stone noted in the central CBD. Pancreas: Unremarkable. No pancreatic ductal dilatation or surrounding inflammatory changes. Spleen: Normal in size without focal abnormality. Adrenals/Urinary Tract: The adrenal glands are unremarkable. Small left renal cyst. There  is no hydronephrosis on the physis on either side. The visualized ureters and urinary bladder appear unremarkable. Stomach/Bowel: There is colonic diverticulosis. Mild thickened appearance of the transverse colon may be related to underdistention or represent mild colitis.  There is no bowel obstruction. The appendix is normal. Evaluation for active GI bleed is very limited due to presence of oral contrast. No definite active GI bleed identified. Lymphatic: No adenopathy. Reproductive: Hysterectomy. Other: None Musculoskeletal: Osteopenia with scoliosis and degenerative changes. No acute osseous pathology. IMPRESSION: 1. No evidence of active GI bleed. 2. Underdistention of the proximal transverse colon versus mild colitis. No bowel obstruction. 3. Colonic diverticulosis. 4.  Aortic Atherosclerosis (ICD10-I70.0). Electronically Signed   By: Vanetta Chou M.D.   On: 08/23/2024 15:12   CT ABDOMEN PELVIS W CONTRAST Result Date: 08/22/2024 CLINICAL DATA:  Abdominal pain. EXAM: CT ABDOMEN AND PELVIS WITH CONTRAST TECHNIQUE: Multidetector CT imaging of the abdomen and pelvis was performed using the standard protocol following bolus administration of intravenous contrast. RADIATION DOSE REDUCTION: This exam was performed according to the departmental dose-optimization program which includes automated exposure control, adjustment of the mA and/or kV according to patient size and/or use of iterative reconstruction technique. CONTRAST:  80mL OMNIPAQUE  IOHEXOL  300 MG/ML  SOLN COMPARISON:  CT abdomen pelvis dated 12/03/2017. FINDINGS: Evaluation of this exam is limited due to respiratory motion. Lower chest: The visualized lung bases are clear. No intra-abdominal free air or free fluid. Hepatobiliary: Subcentimeter hepatic hypodense lesions are too small to characterize. There is mild dilatation, post cholecystectomy. No retained calcified stone noted in the central CBD. Pancreas: Unremarkable. No pancreatic ductal dilatation or surrounding inflammatory changes. Spleen: Normal in size without focal abnormality. Adrenals/Urinary Tract: The adrenal glands are unremarkable. Small left renal cyst. There is no hydronephrosis on either side. There is symmetric enhancement and excretion of contrast  by both kidneys. The visualized ureters and urinary bladder are unremarkable. Stomach/Bowel: There is diffuse colonic diverticulosis. There is no bowel obstruction or active inflammation. The appendix is normal. Vascular/Lymphatic: Mild aortoiliac atherosclerotic disease. The IVC is unremarkable. No portal venous gas. There is no adenopathy. Reproductive: Hysterectomy.  No suspicious adnexal masses. Other: None Musculoskeletal: Degenerative changes of the spine. No acute osseous pathology. Left gluteal lipoma. IMPRESSION: 1. No acute intra-abdominal or pelvic pathology. 2. Colonic diverticulosis. No bowel obstruction. Normal appendix. 3.  Aortic Atherosclerosis (ICD10-I70.0). Electronically Signed   By: Vanetta Chou M.D.   On: 08/22/2024 15:32    Medications: I have reviewed the patient's current medications.  Assessment: Painless hematochezia, presumed diverticular bleed  CT angio 08/22/2024 did not show evidence of active GI bleed   Hemoglobin stable at 9, so far as needed 1 unit PRBC transfusion on 08/23/2024  Stable hemodynamics, blood pressure 135/59, heart rate 95  Plan: Will start on regular diet. Monitor H&H in a.m., if stable and without evidence of ongoing hematochezia, possible discharge in a.m. tomorrow. Dr. Kriss will follow in a.m.SABRA  Estelita Manas, MD 08/24/2024, 10:55 AM

## 2024-08-25 ENCOUNTER — Other Ambulatory Visit: Payer: Self-pay

## 2024-08-25 DIAGNOSIS — K7581 Nonalcoholic steatohepatitis (NASH): Secondary | ICD-10-CM | POA: Diagnosis not present

## 2024-08-25 DIAGNOSIS — E118 Type 2 diabetes mellitus with unspecified complications: Secondary | ICD-10-CM | POA: Diagnosis not present

## 2024-08-25 DIAGNOSIS — G4733 Obstructive sleep apnea (adult) (pediatric): Secondary | ICD-10-CM | POA: Diagnosis not present

## 2024-08-25 DIAGNOSIS — K648 Other hemorrhoids: Secondary | ICD-10-CM | POA: Diagnosis not present

## 2024-08-25 DIAGNOSIS — D62 Acute posthemorrhagic anemia: Secondary | ICD-10-CM | POA: Diagnosis not present

## 2024-08-25 DIAGNOSIS — G3101 Pick's disease: Secondary | ICD-10-CM | POA: Diagnosis not present

## 2024-08-25 DIAGNOSIS — K922 Gastrointestinal hemorrhage, unspecified: Secondary | ICD-10-CM | POA: Diagnosis not present

## 2024-08-25 DIAGNOSIS — F0280 Dementia in other diseases classified elsewhere without behavioral disturbance: Secondary | ICD-10-CM | POA: Diagnosis not present

## 2024-08-25 LAB — RENAL FUNCTION PANEL
Albumin: 3.3 g/dL — ABNORMAL LOW (ref 3.5–5.0)
Anion gap: 8 (ref 5–15)
BUN: 8 mg/dL (ref 8–23)
CO2: 25 mmol/L (ref 22–32)
Calcium: 8.5 mg/dL — ABNORMAL LOW (ref 8.9–10.3)
Chloride: 109 mmol/L (ref 98–111)
Creatinine, Ser: 0.72 mg/dL (ref 0.44–1.00)
GFR, Estimated: >60 mL/min
Glucose, Bld: 117 mg/dL — ABNORMAL HIGH (ref 70–99)
Phosphorus: 3.4 mg/dL (ref 2.5–4.6)
Potassium: 3.7 mmol/L (ref 3.5–5.1)
Sodium: 142 mmol/L (ref 135–145)

## 2024-08-25 LAB — CBC
HCT: 23.4 % — ABNORMAL LOW (ref 36.0–46.0)
HCT: 28.7 % — ABNORMAL LOW (ref 36.0–46.0)
Hemoglobin: 7.3 g/dL — ABNORMAL LOW (ref 12.0–15.0)
Hemoglobin: 9.3 g/dL — ABNORMAL LOW (ref 12.0–15.0)
MCH: 27.7 pg (ref 26.0–34.0)
MCH: 27.4 pg (ref 26.0–34.0)
MCHC: 31.2 g/dL (ref 30.0–36.0)
MCHC: 32.4 g/dL (ref 30.0–36.0)
MCV: 88.6 fL (ref 80.0–100.0)
MCV: 84.4 fL (ref 80.0–100.0)
Platelets: 347 K/uL (ref 150–400)
Platelets: 383 K/uL (ref 150–400)
RBC: 2.64 MIL/uL — ABNORMAL LOW (ref 3.87–5.11)
RBC: 3.4 MIL/uL — ABNORMAL LOW (ref 3.87–5.11)
RDW: 15.3 % (ref 11.5–15.5)
RDW: 17.6 % — ABNORMAL HIGH (ref 11.5–15.5)
WBC: 14.5 K/uL — ABNORMAL HIGH (ref 4.0–10.5)
WBC: 13.2 K/uL — ABNORMAL HIGH (ref 4.0–10.5)
nRBC: 0.3 % — ABNORMAL HIGH (ref 0.0–0.2)
nRBC: 0.6 % — ABNORMAL HIGH (ref 0.0–0.2)

## 2024-08-25 LAB — GLUCOSE, CAPILLARY
Glucose-Capillary: 102 mg/dL — ABNORMAL HIGH (ref 70–99)
Glucose-Capillary: 144 mg/dL — ABNORMAL HIGH (ref 70–99)
Glucose-Capillary: 94 mg/dL (ref 70–99)
Glucose-Capillary: 111 mg/dL — ABNORMAL HIGH (ref 70–99)

## 2024-08-25 LAB — HEMOGLOBIN AND HEMATOCRIT, BLOOD
HCT: 31 % — ABNORMAL LOW (ref 36.0–46.0)
Hemoglobin: 10 g/dL — ABNORMAL LOW (ref 12.0–15.0)

## 2024-08-25 LAB — MAGNESIUM: Magnesium: 2 mg/dL (ref 1.7–2.4)

## 2024-08-25 MED ORDER — SODIUM CHLORIDE 0.9% IV SOLUTION: Freq: Once | INTRAVENOUS | Status: AC

## 2024-08-25 NOTE — Plan of Care (Signed)

## 2024-08-25 NOTE — Progress Notes (Signed)
 [facility] Progress Note  SUBJECTIVE:   Interval history: Meghan Hickman was seen and evaluated today at bedside.  Communication via tablet at bedside.  Tolerating breakfast at time of my evaluation.  No nausea or vomiting.  No abdominal pain.  No chest pain or shortness of breath.  Noted that Meghan Hickman had bowel movement since last evaluation that contained blood (last bowel movement documented per nursing staff occurred on 08/23/2024 that was red).  Past Medical History:  Diagnosis Date   Arthritis    Depression    Diabetes mellitus without complication (HCC)    Diverticulitis    GERD (gastroesophageal reflux disease)    Headache    Heart murmur    Hyperlipidemia    Hypertension    Speech problem    Wears glasses    Past Surgical History:  Procedure Laterality Date   CHOLECYSTECTOMY N/A 12/05/2014   Procedure: LAPAROSCOPIC CHOLECYSTECTOMY;  Surgeon: Donnice Bury, MD;  Location: [facility] OR;  Service: General;  Laterality: N/A;   SHOULDER ARTHROSCOPY WITH SUBACROMIAL DECOMPRESSION AND BICEP TENDON REPAIR Right 04/23/2013   Procedure: SHOULDER ARTHROSCOPY WITH SUBACROMIAL DECOMPRESSION AND BICEP TENDON TENOTOMY;  Surgeon: Eva Elsie Herring, MD;  Location: [facility];  Service: Orthopedics;  Laterality: Right;   Current Facility-Administered Medications  Medication Dose Route Frequency Provider Last Rate Last Admin   0.9 %  sodium chloride  infusion (Manually program via Guardrails IV Fluids)   Intravenous Once Emil Share, DO       0.9 %  sodium chloride  infusion (Manually program via Guardrails IV Fluids)   Intravenous Once Gonfa, Taye T, MD   Held at 08/23/24 1436   acetaminophen  (TYLENOL ) tablet 650 mg  650 mg Oral Q6H PRN Jackee Zebedee BROCKS, MD       Or   acetaminophen  (TYLENOL ) suppository 650 mg  650 mg Rectal Q6H PRN Jackee Zebedee BROCKS, MD       atorvastatin  (LIPITOR) tablet 20 mg  20 mg Oral Daily Gonfa, Taye T, MD   20 mg at 08/24/24 0941    hydrALAZINE  (APRESOLINE ) injection 10 mg  10 mg Intravenous Q4H PRN Sheehan, Theresa C, MD       insulin  aspart (novoLOG ) injection 0-9 Units  0-9 Units Subcutaneous TID WC Gonfa, Taye T, MD   2 Units at 08/24/24 1656   memantine  (NAMENDA ) tablet 10 mg  10 mg Oral BID Gonfa, Taye T, MD   10 mg at 08/24/24 2231   morphine  (PF) 2 MG/ML injection 2 mg  2 mg Intravenous Q2H PRN Sheehan, Theresa C, MD       ondansetron  (ZOFRAN ) tablet 4 mg  4 mg Oral Q6H PRN Jackee Zebedee BROCKS, MD       Or   ondansetron  (ZOFRAN ) injection 4 mg  4 mg Intravenous Q6H PRN Sheehan, Theresa C, MD       oxyCODONE  (Oxy IR/ROXICODONE ) immediate release tablet 5 mg  5 mg Oral Q4H PRN Sheehan, Theresa C, MD       sertraline  (ZOLOFT ) tablet 25 mg  25 mg Oral q AM Gonfa, Taye T, MD   25 mg at 08/24/24 0941   sodium chloride  flush (NS) 0.9 % injection 3 mL  3 mL Intravenous Q12H Jackee Zebedee BROCKS, MD   3 mL at 08/24/24 2229   traZODone  (DESYREL ) tablet 25 mg  25 mg Oral QHS PRN Jackee Zebedee BROCKS, MD       Allergies as of 08/22/2024   (No Known Allergies)   Review of  Systems:  Review of Systems  Respiratory:  Negative for shortness of breath.   Cardiovascular:  Negative for chest pain.  Gastrointestinal:  Positive for blood in stool. Negative for abdominal pain, nausea and vomiting.    OBJECTIVE:   Temp:  [97.9 F (36.6 C)-98.4 F (36.9 C)] 97.9 F (36.6 C) (01/17 0915) Pulse Rate:  [65-102] 66 (01/17 0915) Resp:  [15-18] 15 (01/17 0915) BP: (125-144)/(68-81) 130/68 (01/17 0915) SpO2:  [94 %-100 %] 99 % (01/17 0915) Last BM Date : 08/24/24 Physical Exam Constitutional:      General: Meghan Hickman is not in acute distress.    Appearance: Meghan Hickman is not ill-appearing, toxic-appearing or diaphoretic.  Cardiovascular:     Rate and Rhythm: Normal rate and regular rhythm.  Pulmonary:     Effort: No respiratory distress.     Breath sounds: Normal breath sounds.  Abdominal:     General: Bowel sounds are normal. There is no  distension.     Palpations: Abdomen is soft.     Tenderness: There is no abdominal tenderness. There is no guarding.  Neurological:     Mental Status: Meghan Hickman is alert.     Labs: Recent Labs    08/23/24 1238 08/23/24 1754 08/24/24 0139 08/24/24 0937 08/24/24 1843 08/25/24 0231  WBC 12.8*  --  14.3*  --   --  14.5*  HGB 8.6*   < > 8.0* 9.0* 8.1* 7.3*  HCT 27.2*   < > 24.8* 28.6* 25.8* 23.4*  PLT 246  --  318  --   --  347   < > = values in this interval not displayed.   BMET Recent Labs    08/23/24 0554 08/24/24 0139 08/25/24 0231  NA 143 144 142  K 3.3* 4.1 3.7  CL 111 110 109  CO2 28 23 25   GLUCOSE 97 123* 117*  BUN 8 8 8   CREATININE 0.76 0.71 0.72  CALCIUM  8.4* 8.7* 8.5*   LFT Recent Labs    08/22/24 1433 08/24/24 0139 08/25/24 0231  PROT 6.6  --   --   ALBUMIN 3.4*   < > 3.3*  AST 48*  --   --   ALT 35  --   --   ALKPHOS 148*  --   --   BILITOT 0.5  --   --    < > = values in this interval not displayed.   PT/INR No results for input(s): LABPROT, INR in the last 72 hours. Diagnostic imaging: CT ANGIO GI BLEED Result Date: 08/23/2024 CLINICAL DATA:  Lower GI bleed. EXAM: CTA ABDOMEN AND PELVIS WITHOUT AND WITH CONTRAST TECHNIQUE: Multidetector CT imaging of the abdomen and pelvis was performed using the standard protocol during bolus administration of intravenous contrast. Multiplanar reconstructed images and MIPs were obtained and reviewed to evaluate the vascular anatomy. RADIATION DOSE REDUCTION: This exam was performed according to the departmental dose-optimization program which includes automated exposure control, adjustment of the mA and/or kV according to patient size and/or use of iterative reconstruction technique. CONTRAST:  OMNIPAQUE  IOHEXOL  350 MG/ML SOLN COMPARISON:  CT abdomen pelvis dated 08/24/2023. FINDINGS: Evaluation of this exam is limited due to respiratory motion. VASCULAR Aorta: Mild aortoiliac atherosclerotic disease. No  aneurysmal dilatation or dissection. No periaortic fluid collection. Celiac: Patent without evidence of aneurysm, dissection, vasculitis or significant stenosis. The SMA: Patent without evidence of aneurysm, dissection, vasculitis or significant stenosis. Renals: Both renal arteries are patent without evidence of aneurysm, dissection, vasculitis, fibromuscular dysplasia or significant  stenosis. IMA: Patent without evidence of aneurysm, dissection, vasculitis or significant stenosis. Inflow: Mild atherosclerotic calcification. No abnormal dilatation or dissection. The iliac arteries are patent. Proximal Outflow: The visualized proximal pleural is patent. Veins: The IVC is unremarkable.  No portal venous gas. Review of the MIP images confirms the above findings. NON-VASCULAR Lower chest: The visualized lung bases are clear. No intra-abdominal free air or free fluid. Hepatobiliary: Subcentimeter hypodense focus in the posterior liver is too small to characterize. There is mild dilatation, post cholecystectomy. No retained calcified stone noted in the central CBD. Pancreas: Unremarkable. No pancreatic ductal dilatation or surrounding inflammatory changes. Spleen: Normal in size without focal abnormality. Adrenals/Urinary Tract: The adrenal glands are unremarkable. Small left renal cyst. There is no hydronephrosis on the physis on either side. The visualized ureters and urinary bladder appear unremarkable. Stomach/Bowel: There is colonic diverticulosis. Mild thickened appearance of the transverse colon may be related to underdistention or represent mild colitis. There is no bowel obstruction. The appendix is normal. Evaluation for active GI bleed is very limited due to presence of oral contrast. No definite active GI bleed identified. Lymphatic: No adenopathy. Reproductive: Hysterectomy. Other: None Musculoskeletal: Osteopenia with scoliosis and degenerative changes. No acute osseous pathology. IMPRESSION: 1. No evidence  of active GI bleed. 2. Underdistention of the proximal transverse colon versus mild colitis. No bowel obstruction. 3. Colonic diverticulosis. 4.  Aortic Atherosclerosis (ICD10-I70.0). Electronically Signed   By: Vanetta Chou M.D.   On: 08/23/2024 15:12   IMPRESSION: Hematochezia, suspected diverticular bleeding Acute blood loss anemia History pancolonic diverticulosis on colonoscopy 2016 History hemorrhoids Diabetes mellitus Aphasia  PLAN: -Continue supportive management including blood transfusion for hemoglobin less than 7, regular diet, monitoring of bowel movements -No plans for endoscopic evaluation at this time -[facility] will follow   LOS: 3 days   Estefana Keas, [facility]

## 2024-08-25 NOTE — TOC Initial Note (Signed)
 Transition of Care [facility]) - Initial/Assessment Note    Patient Details  Name: Meghan Hickman MRN: [id number] Date of Birth: Oct 27, 1950  [facility]) CM/SW Contact:    Sonda Manuella Quill, RN Phone Number: 08/25/2024, 5:59 PM  Clinical Narrative:                 Recc received for HHPT; pt non-verbal; spoke w/ her cousin Meghan Hickman; she said pt lives at [facility] [place]; she plans for pt to return at d/c; she will arrange transportation; insurance/PCP verified; she denied pt experiencing SDOH risks; pt does not have DME, HH services, or home oxygen; Ms Hurwitz said pt has private duty care givers from [employer], [place] (POC Meghan Hickman); pt is receiving ST at facility; Ms Vath agreed for pt to receive HHPT; will check w/ facility for name of contracted agency; IP CM following.  Expected Discharge Plan: Home/Self Care Barriers to Discharge: Continued Medical Work up   Patient Goals and CMS Choice Patient states their goals for this hospitalization and ongoing recovery are:: return to [facility] [place]     [place] ownership interest in [facility].provided to:: Adult Children    Expected Discharge Plan and Services   Discharge Planning Services: CM Consult   Living arrangements for the past 2 months: Apartment                 DME Arranged: N/A DME Agency: NA       HH Arranged: NA HH Agency: NA        Prior Living Arrangements/Services Living arrangements for the past 2 months: Apartment Lives with:: Self Patient language and need for interpreter reviewed:: Yes Do you feel safe going back to the place where you live?: Yes      Need for Family Participation in Patient Care: Yes (Comment) Care giver support system in place?: Yes (comment) Current home services:  (n/a) Criminal Activity/Legal Involvement Pertinent to Current Situation/Hospitalization: No - Comment as needed  Activities of Daily Living   ADL Screening  (condition at time of admission) Independently performs ADLs?: No Does the patient have a NEW difficulty with bathing/dressing/toileting/self-feeding that is expected to last >3 days?: No Does the patient have a NEW difficulty with getting in/out of bed, walking, or climbing stairs that is expected to last >3 days?: No Does the patient have a NEW difficulty with communication that is expected to last >3 days?: No Is the patient deaf or have difficulty hearing?: No Does the patient have difficulty seeing, even when wearing glasses/contacts?: No Does the patient have difficulty concentrating, remembering, or making decisions?: Yes  Permission Sought/Granted Permission sought to share information with : Case Manager Permission granted to share information with : Yes, Verbal Permission Granted  Share Information with NAME: Case Manager     Permission granted to share info w Relationship: Meghan Hickman (cousin) [phone]     Emotional Assessment Appearance:: Appears stated age Attitude/Demeanor/Rapport: Other (comment) Affect (typically observed): Unable to Assess Orientation: :  (unable to assess) Alcohol / Substance Use: Not Applicable Psych Involvement: No (comment)  Admission diagnosis:  Acute lower GI bleeding [K92.2] Lower GI bleed [K92.2] Patient Active Problem List   Diagnosis Date Noted   Iron  deficiency anemia, unspecified 08/24/2024   Acute lower GI bleeding 08/22/2024   Hemorrhoids, internal 08/22/2024   Anemia due to acute blood loss 08/22/2024   Abdominal pain, generalized 08/09/2023   Alkaline phosphatase elevation 08/09/2023   Elevated LFTs 08/09/2023  Mild late onset Alzheimer's dementia without behavioral disturbance, psychotic disturbance, mood disturbance, or anxiety (HCC) 03/08/2023   Abnormal electrocardiogram (ECG) (EKG) 03/08/2023   Tachycardia 12/08/2022   Stage 3a chronic kidney disease (HCC) 04/27/2022   Flu vaccine need 07/01/2021   Primary  hypertension 03/30/2021   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 [place] [address] Phone: [phone] Fax: [phone]     Social Drivers of Health (SDOH) Social History: SDOH Screenings   Food Insecurity: No Food Insecurity (08/25/2024)  Housing: Low Risk (08/25/2024)  Transportation Needs: No Transportation Needs (08/25/2024)  Utilities: Not At Risk (08/25/2024)  Depression (PHQ2-9): Low Risk (12/21/2023)  Financial Resource Strain: Low Risk  (02/27/2024)   Received from [facility]  Physical Activity: Insufficiently Active (12/03/2022)  Social Connections: Unknown (08/23/2024)  Stress: Stress Concern Present (12/03/2022)  Tobacco Use: Medium Risk (08/16/2024)   Received from [facility]   SDOH Interventions: Food Insecurity Interventions: Intervention Not Indicated, Inpatient TOC Housing Interventions: Intervention Not Indicated, Inpatient TOC Transportation Interventions: Intervention Not Indicated, Inpatient TOC Utilities Interventions: Intervention Not Indicated, Inpatient TOC   Readmission Risk Interventions     No data to display

## 2024-08-25 NOTE — Progress Notes (Signed)
 " PROGRESS NOTE  Meghan Hickman FMW:[id number] DOB: 1951-07-07   PCP: Joshua Debby CROME, MD  Patient is from: Home.  Lives alone.  Dependently ambulates at baseline.  Has caregiver.  DOA: 08/22/2024 LOS: 3  Chief complaints Chief Complaint  Patient presents with   GI Bleeding     Brief Narrative / Interim history: 74 year old F with PMH of Alzheimer's dementia, progressive aphasia, OSA, DM-2, HTN, GERD, nonalcoholic hepatic steatosis and internal hemorrhoids requiring banding in 2019 presenting with intermittent rectal bleed for the last 3 days that family describes as dark red blood, and admitted with acute blood loss anemia due to lower GI bleed.  In ED, stable vitals except for some soft BP.  Hgb 7.5 (was 12.1 in 12/2023).  CT abdomen and pelvis showed diverticulosis but no extravasation.  Sodium 147.  One units of blood ordered.  GI consulted.  Hemoglobin reaching ideal at 5.9 but improved to 8.6 after 1 unit and remained stable.  She has some intermittent GI bleeds but repeat CT angio without extravasation.  GI resumed regular diet.  Plan for discharge on 1/17 if hemoglobin remained stable.   Subjective: Seen and examined earlier this morning.  No major events overnight or this morning.  No complaints but patient is basically nonverbal.  She is awake and alert but does not follow commands.  Assessment and plan: Acute blood loss anemia due to lower GI bleed: Presents with 3 days of rectal bleeding.  Not on antiplatelets or anticoagulations.  Initial and repeat CT angio without extravasation but showed diverticulosis.  History of internal hemorrhoid s/p banding in 2019.  Hgb reached nadir at 5.9 but improved to 8.6 after 1 unit.  She had some bleeding episodes on 1/15 but H&H remained stable.  Anemia panel with iron  deficiency. Recent Labs    08/22/24 1440 08/22/24 1557 08/23/24 0554 08/23/24 1238 08/23/24 1754 08/23/24 2107 08/24/24 0139 08/24/24 0937 08/24/24 1843  08/25/24 0231  HGB 7.5* 6.5* 5.9* 8.6* 8.7* 9.0* 8.0* 9.0* 8.1* 7.3*  - IV Venofer  300 mg x 1.  Will place ambulatory referral for further IV iron . -Transfuse 1 unit and check posttransfusion CBC. - Appreciate help by GI.  Severe dementia without behavioral disturbance Severe aphasia-she is basically nonverbal.  Sometimes uses communication device to point at her name. -Reorientation and delirium precaution.   NIDDM-2: A1c 5.7% Recent Labs  Lab 08/24/24 1143 08/24/24 1615 08/24/24 2001 08/25/24 0743 08/25/24 1234  GLUCAP 100* 160* 119* 102* 144*  - SSI-sensitive  Hypokalemia -Monitor replenish K and Mg as appropriate  Leukocytosis: Likely demargination.  No suspicion for infection. - Monitor  Nonalcoholic hepatic steatosis -Outpatient follow-up.  Mood disorder: Stable - Continue home meds  OSA: Not on CPAP.      There is no height or weight on file to calculate BMI.          DVT prophylaxis:  SCDs Start: 08/22/24 2012  Code Status: Full code. Family Communication: Updated patient's niece, POA over the phone. Level of care: Telemetry Status is: Inpatient Remains inpatient appropriate because: Acute blood loss anemia due to GI bleed   Final disposition: Home   55 minutes with more than 50% spent in reviewing records, counseling patient/family and coordinating care.  Consultants:  Gastroenterology  Procedures: None  Microbiology summarized: None  Objective: Vitals:   08/25/24 0445 08/25/24 0900 08/25/24 0915 08/25/24 1232  BP: (!) 144/81 137/76 130/68 (!) 144/55  Pulse: 85 65 66 100  Resp: 18 15 15  20  Temp: 98.1 F (36.7 C) 98.1 F (36.7 C) 97.9 F (36.6 C) 98.7 F (37.1 C)  TempSrc: Oral Oral Oral Oral  SpO2: 99% 98% 99% 100%    Examination:  GENERAL: No apparent distress.  Nontoxic. HEENT: MMM.  Vision and hearing grossly intact.  NECK: Supple.  No apparent JVD.  RESP:  No IWOB.  Fair aeration bilaterally. CVS:  RRR. Heart  sounds normal.  ABD/GI/GU: BS+. Abd soft, NTND.  MSK/EXT:  Moves extremities. No apparent deformity. No edema.  SKIN: no apparent skin lesion or wound NEURO: AA.  Oriented self.  Does not follow commands.  No apparent focal neuro deficit. PSYCH: Calm. Normal affect.   Sch Meds:  Scheduled Meds:  sodium chloride    Intravenous Once   sodium chloride    Intravenous Once   atorvastatin   20 mg Oral Daily   insulin  aspart  0-9 Units Subcutaneous TID WC   memantine   10 mg Oral BID   sertraline   25 mg Oral q AM   sodium chloride  flush  3 mL Intravenous Q12H   Continuous Infusions:   PRN Meds:.acetaminophen  **OR** acetaminophen , hydrALAZINE , morphine  injection, ondansetron  **OR** ondansetron  (ZOFRAN ) IV, oxyCODONE , traZODone   Antimicrobials: Anti-infectives (From admission, onward)    None        I have personally reviewed the following labs and images: CBC: Recent Labs  Lab 08/22/24 1557 08/23/24 0554 08/23/24 1238 08/23/24 1754 08/23/24 2107 08/24/24 0139 08/24/24 0937 08/24/24 1843 08/25/24 0231  WBC 14.2* 11.1* 12.8*  --   --  14.3*  --   --  14.5*  NEUTROABS 9.4*  --  8.8*  --   --   --   --   --   --   HGB 6.5* 5.9* 8.6*   < > 9.0* 8.0* 9.0* 8.1* 7.3*  HCT 21.7* 19.3* 27.2*   < > 28.1* 24.8* 28.6* 25.8* 23.4*  MCV 90.4 88.1 87.2  --   --  87.0  --   --  88.6  PLT 312 301 246  --   --  318  --   --  347   < > = values in this interval not displayed.   BMP &GFR Recent Labs  Lab 08/22/24 1433 08/22/24 1440 08/23/24 0554 08/24/24 0139 08/25/24 0231  NA 147* 147* 143 144 142  K 3.9 4.1 3.3* 4.1 3.7  CL 114* 111 111 110 109  CO2 26  --  28 23 25   GLUCOSE 109* 109* 97 123* 117*  BUN 12 14 8 8 8   CREATININE 0.79 0.90 0.76 0.71 0.72  CALCIUM  8.7*  --  8.4* 8.7* 8.5*  MG  --   --   --  2.1 2.0  PHOS  --   --   --  3.0 3.4   CrCl cannot be calculated (Unknown ideal weight.). Liver & Pancreas: Recent Labs  Lab 08/22/24 1433 08/24/24 0139 08/25/24 0231   AST 48*  --   --   ALT 35  --   --   ALKPHOS 148*  --   --   BILITOT 0.5  --   --   PROT 6.6  --   --   ALBUMIN 3.4* 3.5 3.3*   No results for input(s): LIPASE, AMYLASE in the last 168 hours. No results for input(s): AMMONIA in the last 168 hours. Diabetic: Recent Labs    08/23/24 0554  HGBA1C 5.7*   Recent Labs  Lab 08/24/24 1143 08/24/24 1615 08/24/24 2001 08/25/24 0743 08/25/24 1234  GLUCAP 100* 160* 119* 102* 144*   Cardiac Enzymes: No results for input(s): CKTOTAL, CKMB, CKMBINDEX, TROPONINI in the last 168 hours. No results for input(s): PROBNP in the last 8760 hours. Coagulation Profile: No results for input(s): INR, PROTIME in the last 168 hours. Thyroid  Function Tests: No results for input(s): TSH, T4TOTAL, FREET4, T3FREE, THYROIDAB in the last 72 hours. Lipid Profile: No results for input(s): CHOL, HDL, LDLCALC, TRIG, CHOLHDL, LDLDIRECT in the last 72 hours. Anemia Panel: Recent Labs    08/23/24 1238 08/23/24 1523  VITAMINB12  --  778  FOLATE  --  >20.0  FERRITIN  --  53  TIBC  --  284  IRON   --  26*  RETICCTPCT 4.6*  --    Urine analysis:    Component Value Date/Time   COLORURINE YELLOW 08/09/2023 1026   APPEARANCEUR Sl Cloudy (A) 08/09/2023 1026   LABSPEC >=1.030 (A) 08/09/2023 1026   PHURINE 5.5 08/09/2023 1026   GLUCOSEU >=1000 (A) 08/09/2023 1026   HGBUR NEGATIVE 08/09/2023 1026   BILIRUBINUR SMALL (A) 08/09/2023 1026   BILIRUBINUR small (A) 12/02/2017 1736   BILIRUBINUR neg 04/24/2015 1151   KETONESUR NEGATIVE 08/09/2023 1026   PROTEINUR trace (A) 12/02/2017 1736   PROTEINUR NEGATIVE 11/30/2015 1303   UROBILINOGEN 0.2 08/09/2023 1026   NITRITE NEGATIVE 08/09/2023 1026   LEUKOCYTESUR NEGATIVE 08/09/2023 1026   Sepsis Labs: Invalid input(s): PROCALCITONIN, LACTICIDVEN  Microbiology: No results found for this or any previous visit (from the past 240 hours).  Radiology Studies: No  results found.     Markus Casten T. Donetta Isaza [facility]  If 7PM-7AM, please contact night-coverage [email or web address] 08/25/2024, 3:43 PM   "

## 2024-08-25 NOTE — Evaluation (Signed)
 Occupational Therapy Evaluation Patient Details Name: Meghan Hickman MRN: [id number] DOB: 1951-07-28 Today's Date: 08/25/2024   History of Present Illness   74 year old female admitted on 08/22/24 with acute blood loss anemia due to lower GI bleed. PMHx of Alzheimer's dementia, progressive aphasia, OSA, DM-2, HTN, GERD, nonalcoholic hepatic steatosis and internal hemorrhoids requiring banding in 2019     Clinical Impressions Pt is overall functioning at or near her baseline in ADLs and ambulating in room/to bathroom with supervision for safety and to manage IV pole. No further OT needs.      If plan is discharge home, recommend the following:   A little help with bathing/dressing/bathroom;Assistance with cooking/housework;Direct supervision/assist for medications management;Direct supervision/assist for financial management;Assist for transportation;Help with stairs or ramp for entrance     Functional Status Assessment   Patient has not had a recent decline in their functional status     Equipment Recommendations   None recommended by OT     Recommendations for Other Services         Precautions/Restrictions   Precautions Precautions: None Precaution/Restrictions Comments: aphasia - nonverbal, uses communication device Restrictions Weight Bearing Restrictions Per Provider Order: No     Mobility Bed Mobility Overal bed mobility: Modified Independent                  Transfers Overall transfer level: Needs assistance Equipment used: None Transfers: Sit to/from Stand Sit to Stand: Supervision           General transfer comment: supervision for safety and IV pole      Balance Overall balance assessment: No apparent balance deficits (not formally assessed)                                         ADL either performed or assessed with clinical judgement   ADL Overall ADL's : At baseline                                        General ADL Comments: supervised to bathroom, assisted to change brief, for pericare after bloody stool and to change wet gown, assisted for oral care at EOB at pt's request     Vision Baseline Vision/History: 0 No visual deficits Ability to See in Adequate Light: 0 Adequate Patient Visual Report: No change from baseline       Perception         Praxis         Pertinent Vitals/Pain Pain Assessment Pain Assessment: Faces Faces Pain Scale: No hurt     Extremity/Trunk Assessment Upper Extremity Assessment Upper Extremity Assessment: Overall WFL for tasks assessed;Right hand dominant   Lower Extremity Assessment Lower Extremity Assessment: Defer to PT evaluation   Cervical / Trunk Assessment Cervical / Trunk Assessment: Normal   Communication Communication Communication: Impaired Factors Affecting Communication: Difficulty expressing self   Cognition Arousal: Alert Behavior During Therapy: Flat affect Cognition: History of cognitive impairments                               Following commands: Intact       Cueing  General Comments   Cueing Techniques: Verbal cues;Visual cues;Gestural cues      Exercises     Shoulder  Instructions      Home Living Family/patient expects to be discharged to:: Private residence   Available Help at Discharge: Personal care attendant;Available 24 hours/day         Home Layout: One level               Home Equipment: None          Prior Functioning/Environment Prior Level of Function : Needs assist             Mobility Comments: caregivers 24/7 to assist as needed with mobility as needed ADLs Comments: assist as needed for ADLs and for all IADLs    OT Problem List:     OT Treatment/Interventions:        OT Goals(Current goals can be found in the care plan section)       OT Frequency:       Co-evaluation              AM-PAC OT 6 Clicks Daily Activity      Outcome Measure Help from another person eating meals?: A Little Help from another person taking care of personal grooming?: A Little Help from another person toileting, which includes using toliet, bedpan, or urinal?: A Little Help from another person bathing (including washing, rinsing, drying)?: A Little Help from another person to put on and taking off regular upper body clothing?: A Little Help from another person to put on and taking off regular lower body clothing?: A Little 6 Click Score: 18   End of Session Nurse Communication: Other (comment) (aware pt with bloody stool)  Activity Tolerance: Patient tolerated treatment well Patient left: in bed;with call bell/phone within reach;with bed alarm set;with family/visitor present  OT Visit Diagnosis: Cognitive communication deficit (M58.158)                Time: 8869-8845 OT Time Calculation (min): 24 min Charges:  OT General Charges $OT Visit: 1 Visit OT Evaluation $OT Eval Moderate Complexity: 1 Mod  [name], OTR/L Acute Rehabilitation Services Office: [phone]   [name] 08/25/2024, 12:01 PM

## 2024-08-26 LAB — CBC
HCT: 28.7 % — ABNORMAL LOW (ref 36.0–46.0)
Hemoglobin: 9.2 g/dL — ABNORMAL LOW (ref 12.0–15.0)
MCH: 27.5 pg (ref 26.0–34.0)
MCHC: 32.1 g/dL (ref 30.0–36.0)
MCV: 85.9 fL (ref 80.0–100.0)
Platelets: 391 K/uL (ref 150–400)
RBC: 3.34 MIL/uL — ABNORMAL LOW (ref 3.87–5.11)
RDW: 18.1 % — ABNORMAL HIGH (ref 11.5–15.5)
WBC: 13.3 K/uL — ABNORMAL HIGH (ref 4.0–10.5)
nRBC: 0.4 % — ABNORMAL HIGH (ref 0.0–0.2)

## 2024-08-26 LAB — HEMOGLOBIN AND HEMATOCRIT, BLOOD
HCT: 29.8 % — ABNORMAL LOW (ref 36.0–46.0)
Hemoglobin: 9.2 g/dL — ABNORMAL LOW (ref 12.0–15.0)

## 2024-08-26 LAB — GLUCOSE, CAPILLARY: Glucose-Capillary: 103 mg/dL — ABNORMAL HIGH (ref 70–99)

## 2024-08-26 LAB — MAGNESIUM: Magnesium: 2.3 mg/dL (ref 1.7–2.4)

## 2024-08-26 NOTE — Plan of Care (Signed)

## 2024-08-26 NOTE — TOC Transition Note (Signed)
 Transition of Care [facility]) - Discharge Note   Patient Details  Name: Meghan Hickman MRN: [id number] Date of Birth: [birth year]  Transition of Care [facility] CM/SW Contact:  Sonda Manuella Quill, RN Phone Number: 08/26/2024, 11:00 AM   Clinical Narrative:    HHPT orders received; pt from [facility] [place]; spoke w/ Rolin at facility; she said agency does not have contracted agency for services; spoke w/ pt's cousin Amarise Lillo [phone]); she agreed for pt to have services; she does not have agency preference; referral given to Cory at [place]; he said agency can provide service; agency contact info placed in follow up provider section of d/c instructions; pt's cousin notified; she will also arrange transportation; no IP CM needs.  Final next level of care: Home w Home Health Services Barriers to Discharge: No Barriers Identified   Patient Goals and CMS Choice Patient states their goals for this hospitalization and ongoing recovery are:: return to [facility] [place]   Choice offered to / list presented to : Adult Children [place] ownership interest in [facility].provided to:: Adult Children    Discharge Placement                       Discharge Plan and Services Additional resources added to the After Visit Summary for     Discharge Planning Services: CM Consult            DME Arranged: N/A DME Agency: NA       HH Arranged: PT HH Agency: [facility] Date [facility] Agency Contacted: 08/26/24 Time HH Agency Contacted: 1100 Representative spoke with at [facility] Agency: Darleene  Social Drivers of Health (SDOH) Interventions SDOH Screenings   Food Insecurity: No Food Insecurity (08/25/2024)  Housing: Low Risk (08/25/2024)  Transportation Needs: No Transportation Needs (08/25/2024)  Utilities: Not At Risk (08/25/2024)  Depression (PHQ2-9): Low Risk (12/21/2023)  Financial Resource Strain: Low Risk  (02/27/2024)   Received from [facility]   Physical Activity: Insufficiently Active (12/03/2022)  Social Connections: Unknown (08/23/2024)  Stress: Stress Concern Present (12/03/2022)  Tobacco Use: Medium Risk (08/16/2024)   Received from [facility]     Readmission Risk Interventions     No data to display

## 2024-08-26 NOTE — Plan of Care (Signed)
  Problem: Clinical Measurements: Goal: Will remain free from infection Outcome: Progressing Goal: Diagnostic test results will improve Outcome: Progressing Goal: Respiratory complications will improve Outcome: Progressing Goal: Cardiovascular complication will be avoided Outcome: Progressing   Problem: Activity: Goal: Risk for activity intolerance will decrease Outcome: Progressing   Problem: Pain Managment: Goal: General experience of comfort will improve and/or be controlled Outcome: Progressing   Problem: Safety: Goal: Ability to remain free from injury will improve Outcome: Progressing

## 2024-08-27 ENCOUNTER — Encounter (HOSPITAL_COMMUNITY): Payer: Self-pay | Admitting: Student

## 2024-08-27 ENCOUNTER — Other Ambulatory Visit: Payer: Self-pay

## 2024-08-27 LAB — BPAM RBC
Blood Product Expiration Date: 202602132359
Blood Product Expiration Date: 202602142359
Blood Product Expiration Date: 202602142359
Blood Product Unit Number: 202602142359
ISSUE DATE / TIME: 202601150622
ISSUE DATE / TIME: 202601170856
ISSUE DATE / TIME: 202601180148
PRODUCT CODE: 202601180449
PRODUCT CODE: 202602142359
Unit Type and Rh: 202602142359
Unit Type and Rh: 6200
Unit Type and Rh: 6200
Unit Type and Rh: 6200
Unit Type and Rh: 6200
Unit Type and Rh: 6200

## 2024-08-27 MED FILL — Trazodone HCl Tab 50 MG: ORAL | 30 days supply | Qty: 30 | Fill #6 | Status: AC

## 2024-08-27 NOTE — Discharge Summary (Signed)
 "  Physician Discharge Summary  Meghan Hickman FMW:[id number] DOB: Mar 14, 1951 DOA: 08/22/2024  PCP: Rudolpho Norleen BIRCH, MD  Admit date: 08/22/2024 Discharge date: 08/26/2024  Admitted From: Home Disposition: Home Recommendations for Outpatient Follow-up:  Outpatient follow-up with PCP in 1 to 2 weeks Ambulatory referral to infusion clinic for IV iron  ordered Check CMP and CBC at follow-up Please follow up on the following pending results: None  Home Health: HHPT Equipment/Devices: No new need identified  Discharge Condition: Stable CODE STATUS: Full code   Contact information for follow-up providers     Rudolpho Norleen BIRCH, MD Follow up.   Specialty: Internal Medicine Contact information: [address] [facility] [place] [address] [phone]              Contact information for after-discharge care     Home Medical Care     [facility]) .   Service: Home Health Services Contact information: [address] [place]  [address] [phone]                     Hospital course 74 year old F with PMH of Alzheimer's dementia, progressive aphasia, OSA, DM-2, HTN, GERD, nonalcoholic hepatic steatosis and internal hemorrhoids requiring banding in 2019 presenting with intermittent rectal bleed for the last 3 days that family describes as dark red blood, and admitted with acute blood loss anemia due to lower GI bleed.   In ED, stable vitals except for some soft BP.  Hgb 7.5 (was 12.1 in 12/2023).  CT abdomen and pelvis showed diverticulosis but no extravasation.  Sodium 147.  One units of blood ordered.  GI consulted.   Hemoglobin reaching ideal at 5.9 but improved to 8.6 after 1 unit.  Evaluated by GI and started on regular diet.   Patient has had some intermittent GI bleeds but repeat CT angio without extravasation.  Hemoglobin dropped to 7.3.  She was transfused additional 1 unit and hemoglobin improved  to 9.3 and remained stable afterward.  Anemia panel with iron  deficiency.  She received IV Venofer  300 mg x 1.  Ambulatory referral to infusion clinic ordered for additional IV iron .  See individual problem list below for more.   Problems addressed during this hospitalization Acute blood loss anemia due to lower GI bleed: Presents with 3 days of rectal bleeding.  Not on antiplatelets or anticoagulations.  Initial and repeat CT angio without extravasation but showed diverticulosis.  History of internal hemorrhoid s/p banding in 2019.  Transfused a total of 2 units.  H&H remained stable.  Anemia panel with iron  deficiency.  Received IV Venofer  300 mg x 1. Recent Labs    08/23/24 1754 08/23/24 2107 08/24/24 0139 08/24/24 0937 08/24/24 1843 08/25/24 0231 08/25/24 1652 08/25/24 1811 08/26/24 0114 08/26/24 0937  HGB 8.7* 9.0* 8.0* 9.0* 8.1* 7.3* 9.3* 10.0* 9.2* 9.2*  -Ambulatory referral to infusion clinic for IV iron  ordered. -Recheck CBC in 1 week.   Severe dementia without behavioral disturbance Severe aphasia-she is basically nonverbal.  Sometimes uses communication device to point at her name. -Reorientation and delirium precaution.   NIDDM-2: A1c 5.7%.  Continue home Farxiga .   Hypokalemia: Resolved.   Leukocytosis: Likely demargination.  No suspicion for infection. -Recheck CBC in 1 week.   Nonalcoholic hepatic steatosis -Outpatient follow-up per GI.   Mood disorder: Stable - Continue home meds   OSA: Not on CPAP.  Medication management - Discontinued medications that she does not take at  home  Body mass index is 27.65 kg/m.           Consultations: Gastroenterology  Time spent 35  minutes  Vital signs Vitals:   08/25/24 1232 08/25/24 2100 08/25/24 2157 08/26/24 0422  BP: (!) 144/55  (!) 140/63 (!) 142/77  Pulse: 100  70 66  Temp: 98.7 F (37.1 C)  98.2 F (36.8 C) 97.6 F (36.4 C)  Resp: 20  18 18   Height:  4' 11 (1.499 m)    Weight:  62.1 kg     SpO2: 100%  97% 100%  TempSrc: Oral  Oral Oral  BMI (Calculated):  27.64       Discharge exam  GENERAL: No apparent distress.  Nontoxic. HEENT: MMM.  Vision and hearing grossly intact.  NECK: Supple.  No apparent JVD.  RESP:  No IWOB.  Fair aeration bilaterally. CVS:  RRR. Heart sounds normal.  ABD/GI/GU: BS+. Abd soft, NTND.  MSK/EXT:  Moves extremities. No apparent deformity. No edema.  SKIN: no apparent skin lesion or wound NEURO: Awake and alert.  Nonverbal.  No apparent focal neuro deficit. PSYCH: Calm. Normal affect.   Discharge Instructions Discharge Instructions     Amb Referral to Intravenous Iron  Therapy   Complete by: As directed    You have been referred to [facility] Infusion team for IV Iron  Infusions. The infusion pharmacy team will reach out to you with appointment information.    Primary Diagnosis Code for IV Iron : D50.9 - Iron  deficiency Anemia   Secondary diagnosis code for IV iron : Other   Comment: blood loss from rectal bleeding   Discharge instructions   Complete by: As directed    It has been a pleasure taking care of you!  You were hospitalized due to gastrointestinal bleeding that seems to have resolved.  You have been treated with blood transfusion and IV iron .  We have send ambulatory referral for additional IV iron .  Someone will call you to arrange this in our infusion clinic.  Follow-up with your primary care doctor in 1 to 2 weeks or sooner if needed.   Take care,   Increase activity slowly   Complete by: As directed       Allergies as of 08/26/2024   No Known Allergies      Medication List     STOP taking these medications    almotriptan  12.5 MG tablet Commonly known as: AXERT    ALPRAZolam  1 MG tablet Commonly known as: XANAX    DULoxetine  60 MG capsule Commonly known as: CYMBALTA    famotidine  40 MG tablet Commonly known as: PEPCID    gabapentin  300 MG capsule Commonly known as: NEURONTIN    ipratropium 0.03 % nasal  spray Commonly known as: ATROVENT    Klor-Con  M20 20 MEQ tablet Generic drug: potassium chloride  SA   metoprolol  succinate 25 MG 24 hr tablet Commonly known as: TOPROL -XL   Qvar  RediHaler 80 MCG/ACT inhaler Generic drug: beclomethasone   traMADol  50 MG tablet Commonly known as: ULTRAM        TAKE these medications    atorvastatin  20 MG tablet Commonly known as: LIPITOR Take 1 tablet (20 mg total) by mouth daily.   blood glucose meter kit and supplies Dispense based on patient and insurance preference. Use up to three times daily as directed. (FOR ICD-10 E11.65). Dispense 100 lancets and test strips and 1 lancing device to match meter.   CertaVite/Antioxidants Tabs Take 1 tablet by mouth daily.   Farxiga  10 MG Tabs tablet Generic drug:  dapagliflozin  propanediol Take 1 tablet (10 mg total) by mouth in the morning before breakfast. What changed: Another medication with the same name was removed. Continue taking this medication, and follow the directions you see here.   levocetirizine 5 MG tablet Commonly known as: XYZAL  Take 1 tablet (5 mg total) by mouth every evening. What changed:  when to take this Another medication with the same name was removed. Continue taking this medication, and follow the directions you see here.   melatonin 5 MG Tabs Take 5 mg by mouth at bedtime.   memantine  10 MG tablet Commonly known as: NAMENDA  Take 1 tablet (10 mg total) by mouth 2 (two) times daily. What changed:  when to take this Another medication with the same name was removed. Continue taking this medication, and follow the directions you see here.   OneTouch Ultra test strip Generic drug: glucose blood USE UP TO 3 TIMES A DAY AS DIRECTED   sertraline  25 MG tablet Commonly known as: ZOLOFT  Take 1 tablet (25 mg total) by mouth daily. What changed: when to take this   traZODone  50 MG tablet Commonly known as: DESYREL  Take 1 tablet (50 mg total) by mouth at bedtime.          Procedures/Studies:   CT ANGIO GI BLEED Result Date: 08/23/2024 CLINICAL DATA:  Lower GI bleed. EXAM: CTA ABDOMEN AND PELVIS WITHOUT AND WITH CONTRAST TECHNIQUE: Multidetector CT imaging of the abdomen and pelvis was performed using the standard protocol during bolus administration of intravenous contrast. Multiplanar reconstructed images and MIPs were obtained and reviewed to evaluate the vascular anatomy. RADIATION DOSE REDUCTION: This exam was performed according to the departmental dose-optimization program which includes automated exposure control, adjustment of the mA and/or kV according to patient size and/or use of iterative reconstruction technique. CONTRAST:  OMNIPAQUE  IOHEXOL  350 MG/ML SOLN COMPARISON:  CT abdomen pelvis dated 08/24/2023. FINDINGS: Evaluation of this exam is limited due to respiratory motion. VASCULAR Aorta: Mild aortoiliac atherosclerotic disease. No aneurysmal dilatation or dissection. No periaortic fluid collection. Celiac: Patent without evidence of aneurysm, dissection, vasculitis or significant stenosis. The SMA: Patent without evidence of aneurysm, dissection, vasculitis or significant stenosis. Renals: Both renal arteries are patent without evidence of aneurysm, dissection, vasculitis, fibromuscular dysplasia or significant stenosis. IMA: Patent without evidence of aneurysm, dissection, vasculitis or significant stenosis. Inflow: Mild atherosclerotic calcification. No abnormal dilatation or dissection. The iliac arteries are patent. Proximal Outflow: The visualized proximal pleural is patent. Veins: The IVC is unremarkable.  No portal venous gas. Review of the MIP images confirms the above findings. NON-VASCULAR Lower chest: The visualized lung bases are clear. No intra-abdominal free air or free fluid. Hepatobiliary: Subcentimeter hypodense focus in the posterior liver is too small to characterize. There is mild dilatation, post cholecystectomy. No retained  calcified stone noted in the central CBD. Pancreas: Unremarkable. No pancreatic ductal dilatation or surrounding inflammatory changes. Spleen: Normal in size without focal abnormality. Adrenals/Urinary Tract: The adrenal glands are unremarkable. Small left renal cyst. There is no hydronephrosis on the physis on either side. The visualized ureters and urinary bladder appear unremarkable. Stomach/Bowel: There is colonic diverticulosis. Mild thickened appearance of the transverse colon may be related to underdistention or represent mild colitis. There is no bowel obstruction. The appendix is normal. Evaluation for active GI bleed is very limited due to presence of oral contrast. No definite active GI bleed identified. Lymphatic: No adenopathy. Reproductive: Hysterectomy. Other: None Musculoskeletal: Osteopenia with scoliosis and degenerative changes. No acute  osseous pathology. IMPRESSION: 1. No evidence of active GI bleed. 2. Underdistention of the proximal transverse colon versus mild colitis. No bowel obstruction. 3. Colonic diverticulosis. 4.  Aortic Atherosclerosis (ICD10-I70.0). Electronically Signed   By: Vanetta Chou M.D.   On: 08/23/2024 15:12   CT ABDOMEN PELVIS W CONTRAST Result Date: 08/22/2024 CLINICAL DATA:  Abdominal pain. EXAM: CT ABDOMEN AND PELVIS WITH CONTRAST TECHNIQUE: Multidetector CT imaging of the abdomen and pelvis was performed using the standard protocol following bolus administration of intravenous contrast. RADIATION DOSE REDUCTION: This exam was performed according to the departmental dose-optimization program which includes automated exposure control, adjustment of the mA and/or kV according to patient size and/or use of iterative reconstruction technique. CONTRAST:  80mL OMNIPAQUE  IOHEXOL  300 MG/ML  SOLN COMPARISON:  CT abdomen pelvis dated 12/03/2017. FINDINGS: Evaluation of this exam is limited due to respiratory motion. Lower chest: The visualized lung bases are clear. No  intra-abdominal free air or free fluid. Hepatobiliary: Subcentimeter hepatic hypodense lesions are too small to characterize. There is mild dilatation, post cholecystectomy. No retained calcified stone noted in the central CBD. Pancreas: Unremarkable. No pancreatic ductal dilatation or surrounding inflammatory changes. Spleen: Normal in size without focal abnormality. Adrenals/Urinary Tract: The adrenal glands are unremarkable. Small left renal cyst. There is no hydronephrosis on either side. There is symmetric enhancement and excretion of contrast by both kidneys. The visualized ureters and urinary bladder are unremarkable. Stomach/Bowel: There is diffuse colonic diverticulosis. There is no bowel obstruction or active inflammation. The appendix is normal. Vascular/Lymphatic: Mild aortoiliac atherosclerotic disease. The IVC is unremarkable. No portal venous gas. There is no adenopathy. Reproductive: Hysterectomy.  No suspicious adnexal masses. Other: None Musculoskeletal: Degenerative changes of the spine. No acute osseous pathology. Left gluteal lipoma. IMPRESSION: 1. No acute intra-abdominal or pelvic pathology. 2. Colonic diverticulosis. No bowel obstruction. Normal appendix. 3.  Aortic Atherosclerosis (ICD10-I70.0). Electronically Signed   By: Vanetta Chou M.D.   On: 08/22/2024 15:32       The results of significant diagnostics from this hospitalization (including imaging, microbiology, ancillary and laboratory) are listed below for reference.     Microbiology: No results found for this or any previous visit (from the past 240 hours).   Labs:  CBC: Recent Labs  Lab 08/22/24 1557 08/23/24 0554 08/23/24 1238 08/23/24 1754 08/24/24 0139 08/24/24 9062 08/25/24 0231 08/25/24 1652 08/25/24 1811 08/26/24 0114 08/26/24 0937  WBC 14.2*   < > 12.8*  --  14.3*  --  14.5* 13.2*  --  13.3*  --   NEUTROABS 9.4*  --  8.8*  --   --   --   --   --   --   --   --   HGB 6.5*   < > 8.6*   < > 8.0*    < > 7.3* 9.3* 10.0* 9.2* 9.2*  HCT 21.7*   < > 27.2*   < > 24.8*   < > 23.4* 28.7* 31.0* 28.7* 29.8*  MCV 90.4   < > 87.2  --  87.0  --  88.6 84.4  --  85.9  --   PLT 312   < > 246  --  318  --  347 383  --  391  --    < > = values in this interval not displayed.   BMP &GFR Recent Labs  Lab 08/22/24 1433 08/22/24 1440 08/23/24 0554 08/24/24 0139 08/25/24 0231 08/26/24 0114  NA 147* 147* 143 144 142  --  K 3.9 4.1 3.3* 4.1 3.7  --   CL 114* 111 111 110 109  --   CO2 26  --  28 23 25   --   GLUCOSE 109* 109* 97 123* 117*  --   BUN 12 14 8 8 8   --   CREATININE 0.79 0.90 0.76 0.71 0.72  --   CALCIUM  8.7*  --  8.4* 8.7* 8.5*  --   MG  --   --   --  2.1 2.0 2.3  PHOS  --   --   --  3.0 3.4  --    Estimated Creatinine Clearance: 49.5 mL/min (by C-G formula based on SCr of 0.72 mg/dL). Liver & Pancreas: Recent Labs  Lab 08/22/24 1433 08/24/24 0139 08/25/24 0231  AST 48*  --   --   ALT 35  --   --   ALKPHOS 148*  --   --   BILITOT 0.5  --   --   PROT 6.6  --   --   ALBUMIN 3.4* 3.5 3.3*   No results for input(s): LIPASE, AMYLASE in the last 168 hours. No results for input(s): AMMONIA in the last 168 hours. Diabetic: No results for input(s): HGBA1C in the last 72 hours. Recent Labs  Lab 08/25/24 0743 08/25/24 1234 08/25/24 1712 08/25/24 2155 08/26/24 0809  GLUCAP 102* 144* 94 111* 103*   Cardiac Enzymes: No results for input(s): CKTOTAL, CKMB, CKMBINDEX, TROPONINI in the last 168 hours. No results for input(s): PROBNP in the last 8760 hours. Coagulation Profile: No results for input(s): INR, PROTIME in the last 168 hours. Thyroid  Function Tests: No results for input(s): TSH, T4TOTAL, FREET4, T3FREE, THYROIDAB in the last 72 hours. Lipid Profile: No results for input(s): CHOL, HDL, LDLCALC, TRIG, CHOLHDL, LDLDIRECT in the last 72 hours. Anemia Panel: No results for input(s): VITAMINB12, FOLATE, FERRITIN, TIBC,  IRON , RETICCTPCT in the last 72 hours. Urine analysis:    Component Value Date/Time   COLORURINE YELLOW 08/09/2023 1026   APPEARANCEUR Sl Cloudy (A) 08/09/2023 1026   LABSPEC >=1.030 (A) 08/09/2023 1026   PHURINE 5.5 08/09/2023 1026   GLUCOSEU >=1000 (A) 08/09/2023 1026   HGBUR NEGATIVE 08/09/2023 1026   BILIRUBINUR SMALL (A) 08/09/2023 1026   BILIRUBINUR small (A) 12/02/2017 1736   BILIRUBINUR neg 04/24/2015 1151   KETONESUR NEGATIVE 08/09/2023 1026   PROTEINUR trace (A) 12/02/2017 1736   PROTEINUR NEGATIVE 11/30/2015 1303   UROBILINOGEN 0.2 08/09/2023 1026   NITRITE NEGATIVE 08/09/2023 1026   LEUKOCYTESUR NEGATIVE 08/09/2023 1026   Sepsis Labs: Invalid input(s): PROCALCITONIN, LACTICIDVEN   SIGNED:  Brietta Manso T Jahne Krukowski, MD  [facility] 08/27/2024, 4:41 PM   "

## 2024-08-28 ENCOUNTER — Other Ambulatory Visit: Payer: Self-pay

## 2024-08-28 ENCOUNTER — Other Ambulatory Visit (HOSPITAL_COMMUNITY): Payer: Self-pay

## 2024-08-30 ENCOUNTER — Ambulatory Visit (HOSPITAL_COMMUNITY)
Admission: RE | Admit: 2024-08-30 | Discharge: 2024-08-30 | Disposition: A | Discharge status: Home / Self Care | Source: Ambulatory Visit | Attending: Student | Admitting: Student

## 2024-08-30 VITALS — BP 120/45 | HR 70 | Temp 98.0°F | Resp 16

## 2024-08-30 DIAGNOSIS — D509 Iron deficiency anemia, unspecified: Secondary | ICD-10-CM | POA: Insufficient documentation

## 2024-08-30 DIAGNOSIS — D62 Acute posthemorrhagic anemia: Secondary | ICD-10-CM | POA: Diagnosis present

## 2024-08-30 LAB — TYPE AND SCREEN
ABO/RH(D): A POS
Antibody Screen: POSITIVE
DAT, IgG: POSITIVE
Unit division: 0
Unit division: 0
Unit division: 0
Unit division: 0

## 2024-08-30 MED ORDER — SODIUM CHLORIDE 0.9 % IV SOLN
INTRAVENOUS | Status: AC
  Filled 2024-08-30: qty 17

## 2024-08-30 MED ORDER — SODIUM CHLORIDE 0.9 % IV SOLN
510.0000 mg | Freq: Once | INTRAVENOUS | Status: AC
  Administered 2024-08-30: 510 mg via INTRAVENOUS
